# Patient Record
Sex: Male | Born: 1950 | Race: White | Hispanic: No | Marital: Married | Smoking: Former smoker
Health system: Southern US, Community
[De-identification: ages and names within clinical notes are randomized; demographics above are authoritative.]

## PROBLEM LIST (undated history)

## (undated) DIAGNOSIS — I10 Essential (primary) hypertension: Secondary | ICD-10-CM

## (undated) DIAGNOSIS — J449 Chronic obstructive pulmonary disease, unspecified: Secondary | ICD-10-CM

## (undated) DIAGNOSIS — I2510 Atherosclerotic heart disease of native coronary artery without angina pectoris: Secondary | ICD-10-CM

## (undated) DIAGNOSIS — K529 Noninfective gastroenteritis and colitis, unspecified: Secondary | ICD-10-CM

## (undated) DIAGNOSIS — G4700 Insomnia, unspecified: Secondary | ICD-10-CM

## (undated) DIAGNOSIS — I4892 Unspecified atrial flutter: Secondary | ICD-10-CM

## (undated) DIAGNOSIS — N189 Chronic kidney disease, unspecified: Secondary | ICD-10-CM

## (undated) DIAGNOSIS — E785 Hyperlipidemia, unspecified: Secondary | ICD-10-CM

## (undated) DIAGNOSIS — J45909 Unspecified asthma, uncomplicated: Secondary | ICD-10-CM

## (undated) DIAGNOSIS — F4310 Post-traumatic stress disorder, unspecified: Secondary | ICD-10-CM

## (undated) DIAGNOSIS — I4891 Unspecified atrial fibrillation: Secondary | ICD-10-CM

## (undated) HISTORY — DX: Atherosclerotic heart disease of native coronary artery without angina pectoris: I25.10

## (undated) HISTORY — DX: Unspecified atrial flutter: I48.92

## (undated) HISTORY — DX: Unspecified asthma, uncomplicated: J45.909

## (undated) HISTORY — DX: Insomnia, unspecified: G47.00

## (undated) HISTORY — DX: Chronic obstructive pulmonary disease, unspecified: J44.9

## (undated) HISTORY — DX: Essential (primary) hypertension: I10

## (undated) HISTORY — DX: Hyperlipidemia, unspecified: E78.5

---

## 2003-05-19 ENCOUNTER — Encounter: Payer: Self-pay | Admitting: Emergency Medicine

## 2003-05-19 ENCOUNTER — Emergency Department (HOSPITAL_COMMUNITY): Admission: EM | Admit: 2003-05-19 | Discharge: 2003-05-19 | Payer: Self-pay | Admitting: Emergency Medicine

## 2003-11-25 HISTORY — PX: CORONARY ARTERY BYPASS GRAFT: SHX141

## 2004-10-14 ENCOUNTER — Ambulatory Visit (HOSPITAL_COMMUNITY): Admission: RE | Admit: 2004-10-14 | Discharge: 2004-10-14 | Payer: Self-pay | Admitting: Family Medicine

## 2004-10-14 ENCOUNTER — Ambulatory Visit: Payer: Self-pay | Admitting: *Deleted

## 2004-10-14 ENCOUNTER — Ambulatory Visit (HOSPITAL_COMMUNITY): Admission: RE | Admit: 2004-10-14 | Discharge: 2004-10-14 | Payer: Self-pay | Admitting: *Deleted

## 2004-10-15 ENCOUNTER — Ambulatory Visit: Payer: Self-pay | Admitting: Cardiovascular Disease

## 2004-10-15 ENCOUNTER — Inpatient Hospital Stay (HOSPITAL_BASED_OUTPATIENT_CLINIC_OR_DEPARTMENT_OTHER): Admission: RE | Admit: 2004-10-15 | Discharge: 2004-10-15 | Payer: Self-pay | Admitting: Cardiovascular Disease

## 2004-10-15 ENCOUNTER — Inpatient Hospital Stay (HOSPITAL_COMMUNITY): Admission: AD | Admit: 2004-10-15 | Discharge: 2004-10-15 | Payer: Self-pay | Admitting: Cardiovascular Disease

## 2004-10-16 ENCOUNTER — Inpatient Hospital Stay (HOSPITAL_COMMUNITY): Admission: AD | Admit: 2004-10-16 | Discharge: 2004-10-20 | Payer: Self-pay | Admitting: Cardiothoracic Surgery

## 2004-10-16 HISTORY — PX: CORONARY ARTERY BYPASS GRAFT: SHX141

## 2004-10-25 ENCOUNTER — Ambulatory Visit (HOSPITAL_COMMUNITY): Admission: RE | Admit: 2004-10-25 | Discharge: 2004-10-25 | Payer: Self-pay | Admitting: Cardiothoracic Surgery

## 2004-10-25 ENCOUNTER — Ambulatory Visit: Payer: Self-pay | Admitting: Cardiology

## 2004-10-25 ENCOUNTER — Ambulatory Visit (HOSPITAL_COMMUNITY): Admission: RE | Admit: 2004-10-25 | Discharge: 2004-10-25 | Payer: Self-pay | Admitting: Cardiology

## 2004-10-30 ENCOUNTER — Ambulatory Visit: Payer: Self-pay | Admitting: Cardiovascular Disease

## 2004-11-21 ENCOUNTER — Ambulatory Visit: Payer: Self-pay | Admitting: Internal Medicine

## 2004-11-27 ENCOUNTER — Encounter (HOSPITAL_COMMUNITY): Admission: RE | Admit: 2004-11-27 | Discharge: 2004-12-27 | Payer: Self-pay | Admitting: *Deleted

## 2005-01-08 ENCOUNTER — Encounter (HOSPITAL_COMMUNITY): Admission: RE | Admit: 2005-01-08 | Discharge: 2005-02-07 | Payer: Self-pay | Admitting: *Deleted

## 2005-01-29 ENCOUNTER — Ambulatory Visit (HOSPITAL_COMMUNITY): Admission: RE | Admit: 2005-01-29 | Discharge: 2005-01-29 | Payer: Self-pay | Admitting: Internal Medicine

## 2005-01-29 ENCOUNTER — Ambulatory Visit: Payer: Self-pay | Admitting: Internal Medicine

## 2005-01-29 HISTORY — PX: COLONOSCOPY: SHX174

## 2006-08-04 ENCOUNTER — Ambulatory Visit: Payer: Self-pay | Admitting: Cardiology

## 2006-08-13 ENCOUNTER — Ambulatory Visit: Payer: Self-pay

## 2008-02-06 HISTORY — PX: COLONOSCOPY: SHX174

## 2008-02-07 ENCOUNTER — Ambulatory Visit (HOSPITAL_COMMUNITY): Admission: RE | Admit: 2008-02-07 | Discharge: 2008-02-07 | Payer: Self-pay | Admitting: Internal Medicine

## 2008-02-07 ENCOUNTER — Ambulatory Visit: Payer: Self-pay | Admitting: Internal Medicine

## 2008-02-07 ENCOUNTER — Encounter: Payer: Self-pay | Admitting: Internal Medicine

## 2008-07-14 ENCOUNTER — Ambulatory Visit (HOSPITAL_COMMUNITY): Admission: RE | Admit: 2008-07-14 | Discharge: 2008-07-14 | Payer: Self-pay | Admitting: Family Medicine

## 2008-07-25 ENCOUNTER — Ambulatory Visit (HOSPITAL_COMMUNITY): Admission: RE | Admit: 2008-07-25 | Discharge: 2008-07-25 | Payer: Self-pay | Admitting: Family Medicine

## 2008-08-10 ENCOUNTER — Ambulatory Visit: Payer: Self-pay | Admitting: Cardiology

## 2008-08-16 ENCOUNTER — Ambulatory Visit: Payer: Self-pay

## 2010-04-05 ENCOUNTER — Encounter: Payer: Self-pay | Admitting: Cardiology

## 2010-04-08 DIAGNOSIS — I2510 Atherosclerotic heart disease of native coronary artery without angina pectoris: Secondary | ICD-10-CM | POA: Insufficient documentation

## 2010-04-08 DIAGNOSIS — E785 Hyperlipidemia, unspecified: Secondary | ICD-10-CM | POA: Insufficient documentation

## 2010-04-08 DIAGNOSIS — J449 Chronic obstructive pulmonary disease, unspecified: Secondary | ICD-10-CM | POA: Insufficient documentation

## 2010-04-08 DIAGNOSIS — I10 Essential (primary) hypertension: Secondary | ICD-10-CM | POA: Insufficient documentation

## 2010-04-10 ENCOUNTER — Ambulatory Visit: Payer: Self-pay | Admitting: Cardiology

## 2010-04-10 ENCOUNTER — Encounter (INDEPENDENT_AMBULATORY_CARE_PROVIDER_SITE_OTHER): Payer: Self-pay | Admitting: *Deleted

## 2010-04-10 DIAGNOSIS — I951 Orthostatic hypotension: Secondary | ICD-10-CM | POA: Insufficient documentation

## 2010-12-15 ENCOUNTER — Encounter: Payer: Self-pay | Admitting: Otolaryngology

## 2010-12-24 NOTE — Letter (Signed)
Summary: Generic Letter  Architectural technologist, Main Office  [address]   [place], [place] [address]   Phone: [phone]  Fax: [phone]        Apr 10, 2010 MRN: [id number]    Dennis [name] [address] [place], [place]  [address]    TO WHOM IT MAY CONCERN, ABOVE NAMED PT IS UNDER MY CARE.IN MY OPINION IS NOT ABLE TO WORK  THE NIGHT SHIFT FROM A CARDIOVASCULAR STAND POINT. PLEASE CONSIDER ABOVE FROM A LONG TIME FAITHFUL EMPLOYEE.         Sincerely,  TOM WALL MD/Audry Kauzlarich, LPN   This letter has been electronically signed by your physician.

## 2010-12-24 NOTE — Assessment & Plan Note (Signed)
Summary: rov/ fainting spells, pt has bcbs. gd   Visit Type:  rov Primary Provider:  Simone Curia  CC:  pt states he has had some presyncope episodes....no other complaints today.  History of Present Illness: [name] comes in today for evaluation and management of his coronary artery disease, history of bypass surgery, hypertension, and mixed hyperlipidemia.  He describes symptomatic orthostatic hypotension even from childhood. 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  He's been particularly plagued by this lately. He's been a lot of stress having to go back to night shift. This caused him not to take his medications are regular intervals, not to eat appropriately. There is a fair amount of manual work including heat in his job.  He denies angina or ischemic symptoms.  His wife has been sick over this winter and his diet has suffered. He's been eating out a lot and eating whatever comes to the door as his wife puts it. He eats a lot of complex carbs and still drinks a fair amount of beer.  Current Medications (verified): 1)  Fish Oil 1200 Mg Caps (Omega-3 Fatty Acids) .Marland Kitchen.. 1 Cap Two Times A Day 2)  Vytorin 10-40 Mg Tabs (Ezetimibe-Simvastatin) .Marland Kitchen.. 1 Tab At Bedtime 3)  Aspirin 81 Mg Tbec (Aspirin) .... Take One Tablet By Mouth Daily 4)  Enalapril Maleate 10 Mg Tabs (Enalapril Maleate) .Marland Kitchen.. 1 Tab Once Daily 5)  Metoprolol Tartrate 50 Mg Tabs (Metoprolol Tartrate) .Marland Kitchen.. 1 Tab Two Times A Day 6)  Fenofibrate 160 Mg Tabs (Fenofibrate) .Marland Kitchen.. 1 Tab At Bedtime  Allergies (verified): No Known Drug Allergies  Past History:  Past Medical History: Last updated: 04/08/2010 CAD, ARTERY BYPASS GRAFT (ICD-414.04) HYPERTENSION (ICD-401.9) HYPERLIPIDEMIA (ICD-272.4) COPD (ICD-496)  Past Surgical History: Last updated: 04/08/2010 colonoscopy CABG x 3  Social History: Last updated: 04/08/2010 Tobacco Use - Yes. Chews  He is a smoker.  He has two half-sisters without  significant coronary disease.  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  He is married with three children.  He has a long-standing 60-pack-year history of smoking.  He quit six years  ago.  He does chew tobacco, however, so there is still nicotine in the  system.  He has a temperament that would suggest possible alcohol abuse.  He  drinks five to seven beers per day.  Risk Factors: Smoking Status: current (04/08/2010)  Review of Systems       negative other than history of present illness  Vital Signs:  Patient profile:   60 year old male Height:      73 inches Weight:      197 pounds BMI:     26.08 Pulse rate:   68 / minute Pulse (ortho):   74 / minute Pulse rhythm:   regular BP sitting:   131 / 80  (left arm) BP standing:   127 / 79 Cuff size:   large  Vitals Entered By: Danielle Rankin, CMA (Apr 10, 2010 12:18 PM)  Serial Vital Signs/Assessments:  Time      Position  BP       Pulse  Resp  Temp     By 12:23 PM  Lying [place]  131/80   [address] CMA 12:24 PM  Sitting   132/85   67  Danielle Rankin, CMA 12:24 PM  Standing  127/79   [address], [place] 12:26 PM  Standing  130/87   75                    Danielle Rankin, [place] 12:28 PM  Standing  126/84   72                    Danielle Rankin, [place]  Comments: 12:23 PM no sxms By: Danielle Rankin, CMA  12:24 PM no sxsm By: Danielle Rankin, CMA  12:24 PM no sxms By: Danielle Rankin, CMA  12:26 PM no sxms By: Danielle Rankin, CMA  12:28 PM no sxms By: Danielle Rankin, CMA    Physical Exam  General:  Well developed, well nourished, in no acute distress. Head:  normocephalic and atraumatic Eyes:  PERRLA/EOM intact; conjunctiva and lids normal. Neck:  Neck supple, no JVD. No masses, thyromegaly or abnormal cervical nodes. Chest Farah Lepak:  no deformities or breast masses noted Lungs:  decreased breath sounds throughout Heart:  PMI nondisplaced,  regular rate and rhythm, normal S1-S2 Abdomen:  Bowel sounds positive; abdomen soft and non-tender without masses, organomegaly, or hernias noted. No hepatosplenomegaly. Msk:  Back normal, normal gait. Muscle strength and tone normal. Pulses:  pulses normal in all 4 extremities Extremities:  No clubbing or cyanosis. Neurologic:  Alert and oriented x 3. Skin:  Intact without lesions or rashes. Psych:  Normal affect.   Impression & Recommendations:  Problem # 1:  CAD, ARTERY BYPASS GRAFT (ICD-414.04) Assessment Unchanged  His updated medication list for this problem includes:    Aspirin 81 Mg Tbec (Aspirin) .Marland Kitchen... Take one tablet by mouth daily    Enalapril Maleate 10 Mg Tabs (Enalapril maleate) .Marland Kitchen... 1 tab once daily    Metoprolol Tartrate 50 Mg Tabs (Metoprolol tartrate) .Marland Kitchen... 1 tab two times a day  Orders: EKG w/ Interpretation (93000) Nuclear Stress Test (Nuc Stress Test)  His updated medication list for this problem includes:    Aspirin 81 Mg Tbec (Aspirin) .Marland Kitchen... Take one tablet by mouth daily    Enalapril Maleate 10 Mg Tabs (Enalapril maleate) .Marland Kitchen... 1 tab once daily    Metoprolol Tartrate 50 Mg Tabs (Metoprolol tartrate) .Marland Kitchen... 1 tab two times a day  Problem # 2:  HYPERTENSION (ICD-401.9)  His updated medication list for this problem includes:    Aspirin 81 Mg Tbec (Aspirin) .Marland Kitchen... Take one tablet by mouth daily    Enalapril Maleate 10 Mg Tabs (Enalapril maleate) .Marland Kitchen... 1 tab once daily    Metoprolol Tartrate 50 Mg Tabs (Metoprolol tartrate) .Marland Kitchen... 1 tab two times a day  His updated medication list for this problem includes:    Aspirin 81 Mg Tbec (Aspirin) .Marland Kitchen... Take one tablet by mouth daily    Enalapril Maleate 10 Mg Tabs (Enalapril maleate) .Marland Kitchen... 1 tab once daily    Metoprolol Tartrate 50 Mg Tabs (Metoprolol tartrate) .Marland Kitchen... 1 tab two times a day  Problem # 3:  ORTHOSTATIC HYPOTENSION (ICD-458.0) Assessment: Deteriorated Even though he is not hypotensive today, his  symptoms are classic for either neurocardiogenic syncope as a child. Good triggering factors include not staying well hydrated, excess alcohol, night shift which precludes taking his medicines on a regular schedule, not to mention the strain on his system working all night down his mid to late 29s. I've encouraged him  to stay well-hydrated, decrease alcohol, eat regular meals, take his medicines at proper intervals, and to not work night shift. I have written him a note to his employer to change him back to day shift.  Problem # 4:  CAD, ARTERY BYPASS GRAFT (ICD-414.04) Assessment: Unchanged I filled this problem is stable. All range for a 2 year objective assessment of his coronaries with an exercise Myoview this fall. No change in meds today. His updated medication list for this problem includes:i    Aspirin 81 Mg Tbec (Aspirin) .Marland Kitchen... Take one tablet by mouth daily    Enalapril Maleate 10 Mg Tabs (Enalapril maleate) .Marland Kitchen... 1 tab once daily    Metoprolol Tartrate 50 Mg Tabs (Metoprolol tartrate) .Marland Kitchen... 1 tab two times a day  Orders:  EKG w/ Interpretation (93000) Nuclear Stress Test (Nuc Stress Test)  Problem # 5:  HYPERLIPIDEMIA (ICD-272.4) Assessment: Deteriorated I have reviewed his numbers from May of this year. His total cholesterol 260, triglycerides 46, HDL 43. His diet is suffered from his wife being ill this one or, night shift where he does not eat healthy, but he seems to be compliant with his meds. He also still drinks a fair amount of alcohol.  I agree with the addition of fenofibrate to his Vytorin. I strongly encouraged him to decrease alcohol and carbohydrate consumption.  Patient Instructions: 1)  Your physician recommends that you schedule a follow-up appointment in: SEPT 2011 SAME DAY AS STRESS MYOVIEW 2)  Your physician recommends that you continue on your current medications as directed. Please refer to the Current Medication list given to you today. 3)  Your physician has  requested that you have an exercise stress myoview.  For further information please visit [email or web address]  Please follow instruction sheet, as given.SEE DR Tyniesha Howald SAME DAY

## 2011-01-03 ENCOUNTER — Telehealth (INDEPENDENT_AMBULATORY_CARE_PROVIDER_SITE_OTHER): Payer: Self-pay | Admitting: *Deleted

## 2011-01-07 ENCOUNTER — Encounter: Payer: Self-pay | Admitting: Cardiology

## 2011-01-07 ENCOUNTER — Ambulatory Visit (HOSPITAL_COMMUNITY): Payer: BC Managed Care – PPO | Attending: Cardiology

## 2011-01-07 DIAGNOSIS — I25810 Atherosclerosis of coronary artery bypass graft(s) without angina pectoris: Secondary | ICD-10-CM | POA: Insufficient documentation

## 2011-01-07 DIAGNOSIS — R0989 Other specified symptoms and signs involving the circulatory and respiratory systems: Secondary | ICD-10-CM

## 2011-01-07 DIAGNOSIS — R0609 Other forms of dyspnea: Secondary | ICD-10-CM

## 2011-01-07 DIAGNOSIS — R0789 Other chest pain: Secondary | ICD-10-CM

## 2011-01-07 DIAGNOSIS — I2510 Atherosclerotic heart disease of native coronary artery without angina pectoris: Secondary | ICD-10-CM

## 2011-01-09 NOTE — Progress Notes (Signed)
Summary: Nuclear Pre-Procedure  Phone Note Outgoing Call Call back at [facility] Phone [phone]   Call placed by: Stanton Kidney, EMT-P,  January 03, 2011 11:32 AM Action Taken: Phone Call Completed Summary of Call: Reviewed information on Myoview Information Sheet (see scanned document for further details).  Spoke with the patient's wife. Stanton Kidney, EMT-P  January 03, 2011 11:33 AM     Nuclear Med Background Indications for Stress Test: Evaluation for Ischemia, Graft Patency   History: CABG, COPD, Heart Catheterization, Myocardial Perfusion Study  History Comments: '05 Heart Cath > CABG x5 > post op. AFIB '09 MPS: Inf. thinning, EF=64%     Nuclear Pre-Procedure Cardiac Risk Factors: History of Smoking, Hypertension, Lipids Height (in): 73

## 2011-01-15 NOTE — Assessment & Plan Note (Signed)
Summary: Cardiology Nuclear Testing  Nuclear Med Background Indications for Stress Test: Evaluation for Ischemia, Graft Patency   History: CABG, COPD, Heart Catheterization, Myocardial Perfusion Study  History Comments: '05 Heart Cath > CABG x5 > post op. AFIB '09 MPS: Inf. thinning, EF=64%  Symptoms: Chest Pain, SOB    Nuclear Pre-Procedure Cardiac Risk Factors: History of Smoking, Hypertension, Lipids Caffeine/Decaff Intake: none NPO After: 8:30 PM Lungs: clear IV 0.9% NS with Angio Cath: 20g     IV Site: R Forearm IV Started by: Cathlyn Parsons, RN Chest Size (in) 44     Height (in): 73 Weight (lb): 206 BMI: 27.28 Tech Comments: Metoprolol held x 24hrs.  Nuclear Med Study 1 or 2 day study:  1 day     Stress Test Type:  Stress Reading MD:  Olga Millers, MD     Referring MD:  T.Wall Resting Radionuclide:  Technetium 15m Tetrofosmin     Resting Radionuclide Dose:  11 mCi  Stress Radionuclide:  Technetium 39m Tetrofosmin     Stress Radionuclide Dose:  33 mCi   Stress Protocol Exercise Time (min):  8:46 min     Max HR:  146 bpm     Predicted Max HR:  162 bpm  Max Systolic BP: 189 mm Hg     Percent Max HR:  90.12 %     METS: 10.4 Rate Pressure Product:  [address]    Stress Test Technologist:  Milana Na, EMT-P     Nuclear Technologist:  Doyne Keel, CNMT  Rest Procedure  Myocardial perfusion imaging was performed at rest 45 minutes following the intravenous administration of Technetium 27m Tetrofosmin.  Stress Procedure  The patient exercised for 8:46. The patient stopped due to fatigue and denied any chest pain.  There were no significant ST-T wave changes and a rare pvc.  Technetium 66m Tetrofosmin was injected at peak exercise and myocardial perfusion imaging was performed after a brief delay.  QPS Raw Data Images:  Acquisition technically good; normal left ventricular size. Stress Images:  There is decreased uptake in the inferior wall Rest Images:  There is  decreased uptake in the inferior wall. Subtraction (SDS):  No evidence of ischemia. Transient Ischemic Dilatation:  .87  (Normal <1.22)  Lung/Heart Ratio:  .31  (Normal <0.45)  Quantitative Gated Spect Images QGS EDV:  124 ml QGS ESV:  46 ml QGS EF:  62 % QGS cine images:  Normal wall motion.   Overall Impression  Exercise Capacity: Good exercise capacity. BP Response: Normal blood pressure response. Clinical Symptoms: No chest pain ECG Impression: No significant ST segment change suggestive of ischemia. Overall Impression: Normal stress nuclear study with inferior thinning but no ischemia.  Appended Document: Cardiology Nuclear Testing reassurance, no further workup.  Reviewed Juanito Doom, MD  Appended Document: Cardiology Nuclear Testing [facility] Mylo Red RN  Appended Document: Cardiology Nuclear Testing Pt wife is aware of test results.  Pt & wife would also like for Dr. Daleen Squibb to know that his COPD is worsening.  Pt does note a regular cough as well as feeling unusually tired all of the time.  He is still working and is not on oxygen. They have read that these were also a side effect of Metoprolol.    Current BP:138/77, 124/80, 135/86 but these are less than 20 minutes after taking metoprolol. Discussed with wife about keeping bp log and when to check bp.They would like Dr. Daleen Squibb to review his medications and see if maybe the dose could be  decreased or another medication be substituted.  Mylo Red RN  Appended Document: Cardiology Nuclear Testing Pt wife is aware to slowly decrease and wean metoprolol over a couple of weeks.  They will keep a bp log two times a day after 15 min of rest and record.  If cough dissipates as medication is weaned pt would like to stay at lower dose of metoprolol providing ideal bp of less than 130/80 is obtained. Wife will call back to let us know how pt is doing. Mylo Red RN

## 2011-04-08 NOTE — Procedures (Signed)
NAME:  Dennis [name], Dennis [name]               ACCOUNT NO.:  [id number]   MEDICAL RECORD NO.:  [id number]         PATIENT TYPE:  POUT   LOCATION:  RESP                          FACILITY:  [facility]   PHYSICIAN:  Edward L. Juanetta Gosling, M.D.DATE OF BIRTH:  1951/03/03   DATE OF PROCEDURE:  07/25/2008  DATE OF DISCHARGE:                            PULMONARY FUNCTION TEST   1. Spirometry shows a moderate ventilatory defect with evidence of      airflow obstruction.  2. Lung volumes are normal.  3. DLCO is normal.  4. There is significant bronchodilator improvement.  This study is      consistent with the clinical diagnosis of COPD.      Edward L. Juanetta Gosling, M.D.  Electronically Signed     ELH/MEDQ  D:  07/26/2008  T:  07/27/2008  Job:  [id number]   cc:   Dr. Lubertha South

## 2011-04-08 NOTE — Op Note (Signed)
NAME:  Dennis [name], Dennis [name]               ACCOUNT NO.:  [id number]   MEDICAL RECORD NO.:  [id number]          PATIENT TYPE:  AMB   LOCATION:  DAY                           FACILITY:  [facility]   PHYSICIAN:  R. Roetta Sessions, M.D. DATE OF BIRTH:  08-29-1951   DATE OF PROCEDURE:  02/07/2008  DATE OF DISCHARGE:                               OPERATIVE REPORT      R. Roetta Sessions, M.D.  Electronically Signed     RMR/MEDQ  D:  02/07/2008  T:  02/07/2008  Job:  [id number]

## 2011-04-08 NOTE — Op Note (Signed)
NAME:  Dennis [name], Dennis [name]               ACCOUNT NO.:  [id number]   MEDICAL RECORD NO.:  [id number]          PATIENT TYPE:  AMB   LOCATION:  DAY                           FACILITY:  [facility]   PHYSICIAN:  R. Roetta Sessions, M.D. DATE OF BIRTH:  20-Apr-1951   DATE OF PROCEDURE:  02/07/2008  DATE OF DISCHARGE:                               OPERATIVE REPORT   INDICATIONS FOR PROCEDURE:  A 60 year old gentleman who essentially has  no lower GI tract symptoms.  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  There was no family history colon cancer.  Colonoscopy is now  being done as surveillance maneuver.  This approach has been discussed  with the patient at length.  Potential risks, benefits, alternatives,  and limitations have been reviewed, questions answered.  He is  agreeable.  Please see the documented history in the medical record.   PROCEDURE NOTE:  O2 saturation, blood pressure, pulse, and respirations  were monitored throughout the entire procedure.  Conscious sedation  Versed 3 mg IV, Demerol 75 mg IV in divided doses.   INSTRUMENT:  Pentax video chip system.   FINDINGS:  Digital rectal exam revealed no abnormalities.   ENDOSCOPIC FINDINGS:  The prep was good.   Colon:  Colonic mucosa was surveyed from the rectosigmoid junction  through the left, transverse, right colon to the appendiceal orifice,  ileocecal valve and cecum.  These structures were well seen and  photographed for the record.  Terminal ileum was intubated 10 cm from.  From this level, scope was slowly cautiously withdrawn.  All previous  mentioned mucosal surfaces were again seen.  The patient had scattered,  left-sided, shallow, narrow-mouth diverticula, and there was a single 3-  mm polyp at the splenic flexure which was cold biopsied/removed.  Remainder of the colonic mucosa and terminal mucosa appeared normal.  Scope was pulled down in the  rectum where thorough examination of the  rectal mucosa including retroflexed view of the anal verge demonstrated  a minimal anal papilla and internal hemorrhoids.  Otherwise, the rectal  mucosa appeared unremarkable.  The patient tolerated the procedure well  and was reactive to endoscopy.   IMPRESSION:  Minimal internal hemorrhoids of the anal papilla.  Otherwise normal rectum.  Shallow, narrow-mouthed, scattered, left-sided  diverticula.  Diminutive polyp splenic flexure, status post cold biopsy  removal.  The remainder of the colonic mucosa and terminal ileal mucosa  appeared normal.   RECOMMENDATIONS:  1. Diverticulosis literature provided to Mr. Ybarbo.  2. Follow-up on pathology.  3. Further recommendations to follow.      Jonathon Bellows, M.D.  Electronically Signed     RMR/MEDQ  D:  02/07/2008  T:  02/07/2008  Job:  [id number]   cc:   Donna Bernard, M.D.  Fax: [phone]

## 2011-04-08 NOTE — Assessment & Plan Note (Signed)
[facility]                            CARDIOLOGY OFFICE NOTE   NAME:Frentz, Dennis [name]                      MRN:          [id number]  DATE:08/10/2008                            DOB:          1951-04-16    Culp comes in today for followup.   PROBLEM LIST:  1. Coronary artery disease.  He has had a previous coronary artery      bypass surgery with 3 grafts in November 2005.  He has mild      decrease in left ventricular function with an ejection fraction      around 45-50%.   He is working out at [employer] on a regular basis and not having any chest  pain.  He says he does get very fatigued and sometimes has immediate  muscle aches right after the exercises.  He attributes this to his  statin.  He is still on Vytorin.  1. Hyperlipidemia.  2. Hypertension.  3. Chronic obstructive pulmonary disease with a history of heavy      tobacco use.  I think he still chews.   His wife is very supportive and is with him today.  She is in the B+.   CURRENT MEDICATIONS:  1. Omega-3 1200 mg p.o. b.i.d.  2. Vytorin 10/40 daily.  3. Aspirin 81 mg a day.  4. Enalapril 10 mg a day.  5. Metoprolol 50 mg p.o. b.i.d.   Dr. Gerda Diss has discovered that his triglycerides are elevated around  400.  Of note, he admits to drinking about 2 cases of beer a week.  He  also eats a lot complex carbs.  He has switched to wheat bread.   ALLERGIES:  He has no known drug allergies.   PHYSICAL EXAMINATION:  VITAL SIGNS:  His blood pressure is 110/80 and  his pulse was 71 and regular.  His weight was not done.  HEENT:  Normal.  NECK:  Carotid upstrokes were equal bilaterally without bruits.  No JVD.  Thyroid is not enlarged.  Trachea is midline.  LUNGS:  Clear to auscultation and percussion.  HEART:  Nondisplaced PMI.  Normal S1 and S2.  ABDOMEN:  Soft, good bowel sounds.  No midline bruit.  No hepatomegaly.  EXTREMITIES:  No cyanosis, clubbing, or edema.  Pulses are intact.  NEURO:  Intact.   EKG shows normal sinus rhythm with some ST-segment changes in V2 and V3,  which were little more pronounced than last year.   ASSESSMENT AND PLAN:  [name] seems to be doing well from a  functional standpoint.  We clearly need to address his  hypertriglyceridemia, and I have suggested that he cut his alcohol  intake at least by half and also cut off complex carbohydrates as much  as possible.  We reviewed this at length, answering about 20 minutes  worth of questions.   I do not think his muscle fatigue right after the exercise is statin-  related.  He does not ache all the time.  Not being able to take his  statin as a real liability to his vascular future.  I have emphasized  this with he and his wife answering again numerous questions.   He needs an exercise stress Myoview to rule out obstructive coronary  artery disease and test his graft patency.  I have advised him to have  fasting lipids and LFTs with Dr. Gerda Diss in about 3 months.   Assuming this Myoview is negative, we will see him back again in 2  years.     Thomas C. Daleen Squibb, MD, [facility]  Electronically Signed    TCW/MedQ  DD: 08/10/2008  DT: 08/10/2008  Job #: [id number]   cc:   Donna Bernard, M.D.

## 2011-04-11 NOTE — Discharge Summary (Signed)
NAME:  Dennis [name], Dennis [name] NO.:  [id number]   MEDICAL RECORD NO.:  [id number]          PATIENT TYPE:  INP   LOCATION:  3715                         FACILITY:  [facility]   PHYSICIAN:  Charlton Haws, M.D.     DATE OF BIRTH:  1950/11/27   DATE OF ADMISSION:  10/15/2004  DATE OF DISCHARGE:  10/15/2004                           DISCHARGE SUMMARY - REFERRING   HISTORY OF PRESENT ILLNESS:  Dennis [name] is a 60 year old white male who was  referred to our  office by Dr. Gerda Diss for abnormal Cardiolite.  The patient stated that over the preceding year, he has had substernal chest  pressure with exerting himself relieved with rest.  He denies any associated  nausea, vomiting, diaphoresis, or shortness of breath.  However, the  preceding 4-5 weeks he has noticed increased frequency of episodes.  The  stress Cardiolite performed revealed anterolateral ST segment depression.  No rest imaging was performed.  Due to the abnormality, he was recommended  cardiac catheterization.  He also has a history of dyslipidemia and unable  to tolerate statin therapy secondary to elevated LFTs, tobacco, and alcohol  use.   LABORATORY DATA:  Preadmission H&H was 14.9 and 41.1, normal indices,  platelets 178, WBC 8.2.  PT 12.6.  Sodium 134, potassium 4, BUN 11,  creatinine 1.1.  Chest x-ray done at [facility] showed COPD, questionable  right upper lobe nodular density, however, when compared to prior studies,  this was unchanged and probably related to the first rib.  In the hospital,  on November 22, repeat chemistry was performed and this showed sodium 135,  potassium 4, BUN 15, creatinine 1.1, total bilirubin 1.3, alkaline phos and  AST were within normal limits.  ALT was elevated at 45.  CBC had remained  unchanged.  CVTS also obtained a room air blood gas and this showed a pH  7.39, pCO2 43.7, pO2 76.6, with saturation of 95.6.   HOSPITAL COURSE:  Dennis [name] was brought into the [facility] lab for  outpatient  cardiac catheterization, however, Dr. Eden Emms, when he performed the  procedure, discovered a 60% left main, 80% proximal LAD, 80% proximal and  distal circumflex, 70% OM1, 100% RCA with good left to right collaterals.  LV function was low normal with an EF of approximately 55%.  Dr. Eden Emms felt  that he needed heparin, admission, and for CVTS to consult.  Dr. Eden Emms  spoke with Dr. Gerda Diss, Dr. Dorethea Clan, and CVTS.  CVTS saw the patient on  October 15, 2004, and arranged for bypass surgery to be performed on the  morning of November 23.  However, at approximately 6 o'clock, I was informed  by Clarisse Gouge that the patient insisted upon being discharged home.  I went to  the patient's floor/room.  The patient stated he was not staying here, the  surgeon stated he could go home.  On review of the surgeon's note, they did  not give him permission to be discharged and stated it was  up to Dr.  Eden Emms.  After discussing with Dr. Eden Emms, Dr. Eden Emms stated that the  patient could  not be discharged home.  If the patient insisted upon going  home, he needed  to sign out against medical advice.  The patient was agreeable to this.  I  did fill out the pink sheets in regards to his instructions and return to  the closest emergency room  if he had any further chest discomfort as well  as gave him prescription for sublingual nitroglycerin.  He will return per  CVTS instructions.       EW/MEDQ  D:  10/15/2004  T:  10/15/2004  Job:  [id number]   cc:   Donna Bernard, M.D.  [address]  [place]  [place] [address]  Fax: [phone]   Vida Roller, M.D.  Fax: [phone]

## 2011-04-11 NOTE — Procedures (Signed)
   NAME:  Dennis [name], Dennis [name]                         ACCOUNT NO.:  [id number]   MEDICAL RECORD NO.:  [id number]                   PATIENT TYPE:  EMS   LOCATION:  ED                                   FACILITY:  [facility]   PHYSICIAN:  Edward L. Juanetta Gosling, M.D.             DATE OF BIRTH:  1951/08/21   DATE OF PROCEDURE:  05/19/2003  DATE OF DISCHARGE:  05/19/2003                                EKG INTERPRETATION   DATE AND TIME OF TEST:  May 19, 2003 at 1715.   FINDINGS:  The rhythm is sinus rhythm with a rate of about 85.  There is  left axis deviation.  Somewhat slow R wave progression across the precordium  may indicate a previous anterior myocardial infarction and clinical  correlation is suggested.  Abnormal electrocardiogram.                                               Oneal Deputy. Juanetta Gosling, M.D.    ELH/MEDQ  D:  05/22/2003  T:  05/22/2003  Job:  [id number]

## 2011-04-11 NOTE — Assessment & Plan Note (Signed)
[facility]                              CARDIOLOGY OFFICE NOTE   NAME:Dennis [name], Dennis [name]                      MRN:          [id number]  DATE:08/04/2006                            DOB:          Jun 01, 1951    Dennis [name] is a 60 year old gentleman with coronary artery disease, status  post coronary artery bypass grafting x3 on October 17, 2004 by Dr. Kathlee Nations Trigt who comes today to establish with me as his cardiologist.  He has  been followed by Dr. Dorethea Clan who has now left.  He is having no symptoms of  angina which he had several days prior to his bypass surgery.   His EF was 45%.  He had a left internal mammary graft placed in the LAD,  right coronary artery graft to his circumflex, marginal vein graft to the  right coronary artery.   His risk factors were heavy tobacco use which he quit.  He also had a  history of hypertension and severe hyperlipidemia with a cholesterol in the  430 range.   He seems to be very health conscious.  He exercises on a regular basis.   CURRENT MEDICATIONS:  1. Omega-3 1200 mg b.i.d.  2. Vytorin 10/40 daily.  3. Aspirin 81 mg daily.  4. Enalapril 10 mg a day.  5. Metoprolol 50 mg b.i.d.   Other than some muscle aches in his calves, which he attributes to Vytorin,  he has no complaints.  The muscle cramps and aches have gotten worse than  they used to be over the last several weeks.  I have asked him to discuss  this with Dr. Gerda Diss and consider Crestor and Zetia.   PHYSICAL EXAMINATION:  GENERAL:  His exam today is very pleasant.  His wife  is present.  VITAL SIGNS: His blood pressure is 124/90, pulse 77 and regular.  His weight  is 209.  NECK:  His carotids are full without bruits.  There is no JVD.  Thyroid is  not enlarged.  Trachea is midline.  LUNGS:  Clear.  HEART:  Regular rate and rhythm.  ABDOMEN:  Soft with good bowel sounds.  No midline bruit.  There is no  hepatomegaly.  EXTREMITIES:  No  cyanosis, clubbing or edema.  Pulses are intact.   EKG shows normal sinus rhythm; normal EKG.   I have had a long talk with Dennis [name].  I have recommended an exercise rest  stress Myoview off his beta blocker.  If this is negative for ischemia with  good left ventricular function, we will plan on seeing him back again in two  years.  He has an excellent primary care physician who is quite capable of  providing his secondary prevention medicines.  I am happy with his medical  program.  If he continues to have muscle aches, I would consider changing to Crestor  40 and Zetia in combination.  This may have less side effects.  Thomas C. Daleen Squibb, MD, [facility]    TCW/MedQ  DD:  08/04/2006  DT:  08/05/2006  Job #:  [id number]   cc:   Donna Bernard, M.D.

## 2011-04-11 NOTE — Op Note (Signed)
NAME:  Dennis [name], Dennis [name] NO.:  [id number]   MEDICAL RECORD NO.:  [id number]          PATIENT TYPE:  INP   LOCATION:  2310                         FACILITY:  [facility]   PHYSICIAN:  Kathlee Nations Trigt III, M.D.DATE OF BIRTH:  [birth year]   DATE OF PROCEDURE:  10/17/2004  DATE OF DISCHARGE:                                 OPERATIVE REPORT   PREOPERATIVE DIAGNOSIS:  Class IV unstable angina with severe three-vessel  coronary artery disease.   POSTOPERATIVE DIAGNOSIS:  Class IV unstable angina with severe three-vessel  coronary artery disease.   OPERATION:  Coronary artery bypass grafting x3 (left internal mammary artery  to left anterior descending coronary artery, right radial artery graft to  circumflex marginal, saphenous vein graft to right coronary artery).   SURGEON:  Kerin Perna, M.D.   ASSISTANT:  Jerold Coombe, P.A.   ANESTHESIA:  General by Maren Beach, M.D.   INDICATIONS:  The patient is a 60 year old white male with a lipid disorder.  He has had postprandial and exertional chest pain for the past few weeks.  A  cardiac catheterization was performed after his stress test was abnormal.  This demonstrated chronic occlusion of the right coronary, proximal 90%  stenosis of the circumflex with diffuse disease, and proximal 90% stenosis  of the LAD with diffuse disease.  His ejection fraction was 45%, and left  ventricular end-diastolic pressure was approximately 20-22 mmHg.  Based on  his coronary anatomy, which was not felt to be amenable to percutaneous  intervention, a surgical revascularization procedure was recommended.   Prior to surgery I examined the patient in his hospital room and reviewed  the results of cardiac catheterization with the patient and his wife.  I  discussed the indications and expected benefits of coronary artery bypass  surgery for treatment of his coronary artery disease.  I reviewed the  alternatives to surgical therapy  as well.  I discussed with the patient and  his wife the risks to him of coronary artery bypass surgery, including the  risks of MI, CVA, bleeding, blood transfusion requirement, infection, and  death.  He understood that the conduits to be used would include the radial  artery from the right hand, which showed a patent palmar arch by  preoperative ultrasound studies, the left internal mammary artery, and  saphenous vein from the right leg harvested endoscopically.  He understood  that the harvest of the right radial artery could be associated with some  numbness or other neurological deficit of the right hand.  After discussing  all these aspects of the planned procedure and addressing all the patient's  and family's questions, the patient agreed to proceed with the operation as  planned under what I felt was an informed consent.   OPERATIVE FINDINGS:  The coronaries were diffusely diseased consistent with  a hyperlipidemia disorder.  There was diffuse cholesterol plaquing of the  LAD and circumflex vessels, which made them suboptimal targets.  The conduit  used was good.  The patient had a nonspecific coagulopathy, which required  treatment with platelets and FFP after  reversal of the heparin with  protamine.   PROCEDURE:  The patient was brought to the operating room and placed supine  on the operating room table.  General anesthesia was induced under invasive  hemodynamic monitoring.  The chest, abdomen, and legs were prepped with  Betadine and draped as a sterile field.  A right forearm incision was made,  as the arm had also been separately prepped and draped.  The right radial  artery was then harvested as a free graft using the Harmonic scalpel, with  care being taken to avoid any neurovascular deficit.  Prior to dividing the  radial artery, a patent palmar arch signal with the Doppler sterile  transducer was documented.  The artery was removed and flushed with a   papaverine-heparin solution and stored on the sterile back table.  The  forearm incision was then closed in layers using Vicryl and skin staples on  the skin.  The arm was then wrapped and tucked to the patient's side.   A sternal incision was then made as the saphenous vein was harvested  endoscopically from the right thigh.  The left internal mammary artery was  harvested as a pedicle graft from its origin at the subclavian vessels.  It  had good flow.  Heparin was administered, and the ACT was documented as  being therapeutic.  The sternal retractor was placed.  The pericardium was  opened and suspended as a cradle.  Pursestrings were placed in the ascending  aorta and right atrium, and the patient was cannulated and placed on bypass.  The coronaries were identified for grafting.  The mammary artery, radial  artery, and saphenous vein were prepared for the distal anastomoses.  Cardioplegia catheters were placed for both antegrade aortic and retrograde  coronary sinus cardioplegia.  The patient was cooled to 32 degrees and the  aortic crossclamp was applied.  A total of 800 mL of cold blood cardioplegia  was delivered in split doses between the antegrade aortic and retrograde  coronary sinus catheters.  There was good cardioplegic arrest, and septal  temperature dropped to less than 12 degrees.  Topical iced saline was used  to augment myocardial preservation, and a pericardial insulator pad was used  to protect the left phrenic nerve.   The distal coronary anastomoses were then performed.  The first distal  anastomoses was to the nondominant right coronary, which was 1.5 mm in  diameter.  It was totally occluded proximally.  A reversed saphenous vein  was sewn end-to-side with running 7-0 Prolene with good flow through the  graft.  The second distal anastomosis was to the obtuse marginal branch of  the circumflex.  This was a 1.5 mm vessel with diffuse cholesterol and calcified disease  with a proximal 90% stenosis.  The right radial artery  free graft was sewn end-to-side using running 8-0 Prolene.  There was good  flow through the graft.  Cardioplegia was redosed.  The third distal  anastomosis was at the distal aspect of the LAD.  Again this was diffusely  diseased and had a proximal 90% stenosis.  The left IMA pedicle was brought  through an opening created in the left lateral pericardium and was brought  down onto the LAD and sewn end-to-side with a running 8-0 Prolene.  There  was good flow through the anastomosis after briefly opening the vascular  bulldog on the mammary pedicle.  This bulldog was then replaced and the  pedicle was secured to the epicardium  with interrupted Prolenes.   While the crossclamp was still in place, two proximal anastomoses were  performed, placing the radial artery free graft and vein graft on the  anterior aspect of the ascending aorta.  A 4.0 mm punch was used and running  6-0 Prolene.  The air was vented from the left side of the heart and the  coronaries with a dose of retrograde warm blood cardioplegia and the usual  de-airing maneuvers on bypass.  After the proximal anastomoses had been  tied, the crossclamp was removed.   The heart was cardioverted back to a regular rhythm.  The patient was  reperfused and rewarmed.  Temporary pacing wires were applied.  The grafts  were inspected and found to have good flow, and hemostasis was documented at  the proximal and distal sites.  The patient was then weaned from bypass  without difficulty.  Blood pressure and cardiac output were stable.  Protamine was administered without adverse reaction.  The cannulas were  removed.  The mediastinum was irrigated with warm saline and the leg  incision was irrigated and closed in a standard fashion.  The superior  pericardial fat was closed over the aorta and vein grafts.  Two mediastinal  and a left pleural chest tube were  placed and brought out  through separate incisions.  The sternum was closed  with interrupted steel wire.  The pectoralis fascia was closed with a  running #1 Vicryl.  Subcutaneous and skin were closed with a running Vicryl.  Total bypass time was 140 minutes with crossclamp time of 78 minutes.      Pete   PV/MEDQ  D:  10/16/2004  T:  10/17/2004  Job:  [id number]   cc:   [facility]

## 2011-04-11 NOTE — Op Note (Signed)
NAME:  Dennis [name], Dennis [name]               ACCOUNT NO.:  [id number]   MEDICAL RECORD NO.:  [id number]          PATIENT TYPE:  AMB   LOCATION:  DAY                           FACILITY:  [facility]   PHYSICIAN:  R. Roetta Sessions, M.D. DATE OF BIRTH:  21-Jul-1951   DATE OF PROCEDURE:  01/29/2005  DATE OF DISCHARGE:                                 OPERATIVE REPORT   PROCEDURE:  Colonoscopy with biopsy and snare polypectomy.   INDICATIONS FOR PROCEDURE:  The patient is a 60 year old gentleman with rare  intermittent episodes of hematochezia and reported history of distal colitis  seen on colonoscopy some 30 years ago for screening colonoscopy. There is no  family history of colorectal neoplasia or inflammatory bowel disease.  Colonoscopy is now being done. This approach has been discussed with the  patient at length. Potential risks, benefits, and alternatives have been  reviewed and questions answered.   PROCEDURE NOTE:  O2 saturation, blood pressure, pulse, and respirations  monitored throughout the entirety of the procedure. Conscious sedation with  Versed 3 mg IV and Demerol 75 mg IV in divided doses. SB prophylaxis  ampicillin 2 g IV, gentamicin 120 mg IV.   INSTRUMENT:  Olympus video chip system.   FINDINGS:  Digital rectal examination revealed no abnormalities.   ENDOSCOPIC FINDINGS:  Prep was good.   Rectum:  Examination of the rectal mucosa including retroflexed view of anal  verge revealed a 6-mm pedunculated polyp again at 3 cm from the anal verge.  The remainder of the rectal mucosa appeared normal.   Colon:  Colonic mucosa was surveyed from the rectosigmoid junction through  the left, transverse, and right colon to area of the appendiceal orifice,  ileocecal valve, and cecum. These structures were well seen and photographed  for the record. Olympus video scope was slowly withdrawn, and all previously  mentioned mucosal surfaces were again seen. The colonic mucosa appeared  normal. I  elected to go ahead and biopsy sigmoid mucosa and the rectal  mucosa separately just to make sure he did not have any underlying  microscopic colitis. The polyp in the rectum was engaged with the snare, and  removed via snare cautery, it was removed cleanly, and the polyp was  recovered through the scope. The patient tolerated the procedure well and  was reactive to endoscopy.   IMPRESSION:  Rectal polyp status post snare polypectomy as described above.  Otherwise normal rectum. Normal appearing colonic mucosa status post biopsy  of sigmoid rectal mucosa separately.   RECOMMENDATIONS:  1.  No aspirin or arthritis medications for 10 days.  2.  Followup on pathology.  3.  Further recommendations to follow.      RMR/MEDQ  D:  01/29/2005  T:  01/29/2005  Job:  [id number]   cc:   Donna Bernard, M.D.  [address]  [place]  [place] [address]  Fax: [phone]

## 2011-04-11 NOTE — H&P (Signed)
NAME:  JATHAN, BALLING NO.:  [id number]   MEDICAL RECORD NO.:  [id number]          PATIENT TYPE:  OIB   LOCATION:  6501                         FACILITY:  [facility]   PHYSICIAN:  Charlton Haws, M.D.     DATE OF BIRTH:  09-23-1951   DATE OF ADMISSION:  10/15/2004  DATE OF DISCHARGE:                                HISTORY & PHYSICAL   Mr. Sippel is a 60 year old patient of Dr. Lubertha South and Dr. Dionicio Stall.  Arrangements were made for an outpatient catheterization today.  The patient  had severe left main three vessel disease with a total right coronary artery  and left to right collaterals.  He is being admitted for a CVTS consultation  and hopefully surgery in the next day or two.  Patient has a year long  history of exertional chest pain.  Lately it has also been postprandial.  He  had a stress test yesterday.  The Cardiolite images are not available but he  had 4 mm of ST segment depression in the EKG which was high risk.  The  patient has significant denial.  He has been having these symptoms for over  a year and just decided to get them checked.   He has some hyperlipidemia.  There is some issue about inability to take  Statin drugs.  I talked to Dr. Lubertha South and he thinks a lot of this may  be psychological.   PAST MEDICAL HISTORY:  Otherwise fairly unremarkable.   SOCIAL HISTORY:  He is a smoker.  He has two half-sisters without  significant coronary disease.  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  He is married with three children.  He has a long-standing 60-pack-year history of smoking.  He quit six years  ago.  He does chew tobacco, however, so there is still nicotine in the  system.  He has a temperament that would suggest possible alcohol abuse.  He  drinks five to seven beers per day.   ALLERGIES:  He has no known drug allergies.   MEDICATIONS:  He was on an aspirin a day and Prilosec p.r.n.   PHYSICAL EXAMINATION:  VITAL SIGNS:  Blood pressure 160/80, pulse 70 and  regular.  LUNGS:  Clear.  CARDIAC:  Carotids are normal.  There is an S1, S2 without murmur, rub,  gallop, or click.  ABDOMEN:  Benign.  EXTREMITIES:  Intact pulses.  No edema.   LABORATORIES:  Baseline laboratory work is unremarkable.  There is a  question of a lung nodule on his chest x-ray.  He needs a good PA and  lateral.   IMPRESSION:  Left main three vessel disease with left to right collaterals  to a total right coronary artery.  Good left ventricular function.  Patient  will be admitted for a CVTS consultation.   It may be reasonable to get preoperative PFTs as well as surveillance  Dopplers of his carotids and a PA and lateral chest x-ray to further work up  this lung nodule.  PN/MEDQ  D:  10/15/2004  T:  10/15/2004  Job:  [id number]

## 2011-04-11 NOTE — Discharge Summary (Signed)
NAME:  Dennis [name], HYSLOP NO.:  [id number]   MEDICAL RECORD NO.:  [id number]          PATIENT TYPE:  INP   LOCATION:  2033                         FACILITY:  [facility]   PHYSICIAN:  Kerin Perna, M.D.  DATE OF BIRTH:  1951-02-01   DATE OF ADMISSION:  10/15/2004  DATE OF DISCHARGE:  10/20/2004                                 DISCHARGE SUMMARY   HISTORY OF PRESENT ILLNESS:  The patient is a 60 year old gentleman with a  past medical history that is significant for dyslipidemia, who was recently  referred to Dr. Marchelle Folks office for evaluation following an abnormal  exercise Cardiolite.  The patient stated that over the past year he has  noted an increase in substernal pressure sensation with exertion that is  relieved with rest.  He denied associated nausea, vomiting, diaphoresis or  shortness of breath.  Over the previous 4-5 weeks, these episodes began to  be more frequent.  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  He presented to Dr. Gerda Diss who recommended a  Cardiolite test which was performed and showed a dramatic drop in his ST  segments in the anterolateral leads.  He was scheduled for a two day study,  and his stress images were performed.  However, no rest images were done.  The patient was felt to require further evaluation, and upon referring to  Dr. Dorethea Clan, cardiac catheterization was recommended, and he was scheduled to  be admitted on October 15, 2004, for the procedure.   PAST MEDICAL HISTORY:  Dyslipidemia notable for intolerance to statins  secondary to elevation and liver function tests.  He also has a strong  history of dyslipidemia.   ALLERGIES:  No known drug allergies.   ADMISSION MEDICATIONS:  1.  Started on 81 mg aspirins per day by Dr. Gerda Diss last week.  2.  Prilosec.   FAMILY HISTORY/SOCIAL HISTORY/REVIEW OF  SYSTEMS/PHYSICAL EXAMINATION:  Please see the History and Physical done at the time of admission.   HOSPITAL COURSE:  The patient was admitted on October 15, 2004, for cardiac  catheterization.  This was performed by Dr. Eden Emms.  Significant coronary  artery disease including 60% left main, 80% proximal LAD, 80% proximal  circumflex, 100% right coronary stenoses were found.  There was evidence of  good left to right collateralization.  Left ventricular function was in the  low normal range at 55% ejection fraction.  Due to these findings, the  patient was felt to require heparinization and prompt CVTS consultation  which was obtained with Dr. Kathlee Nations Trigt who evaluated the patient and  studies and agreed with recommendations to proceed with surgical  revascularization.   PROCEDURES:  On October 16, 2004, the patient was taken to the operating  room where he underwent the following procedure:   Coronary artery bypass grafting x3.  The following grafts were placed:  1.  Left internal mammary artery to the LAD.  2.  Saphenous vein graft  to the right coronary artery.  3.  Right radial artery to the circumflex.   Findings in the operating room included severe diffuse disease.  He did have  a postoperative coagulopathy requiring both platelets and fresh frozen  plasma.  Also of note, the diagonals and distal circumflex arteries were too  small to graft.  The patient tolerated the procedure well and was taken to  the surgical intensive care unit in stable condition.   HOSPITAL COURSE:  The patient has done quite well.  He was weaned from the  ventilator without significant difficulties.  The coagulopathy resolved and  all routine lines, monitors and drainage devices were discontinued in a  standard fashion.  The patient has been neurologically intact.  His right  upper extremity is neurovascularly intact.  The patient's oxygen has been  weaned and he maintained good saturations on room  air.  He has had a  postoperative atrial fibrillation and has been placed on Amiodarone.  This  has been converted to an oral regimen.  The patient also has a postoperative  anemia, but appears to be tolerating this quite well clinically.  He is  tolerating all routine cardiac rehabilitation phase I modalities without  difficulty.  Most recent hemoglobin and hematocrit dated October 19, 2004,  is 8.0 and 22.4, respectively.  Overall, the patient was felt to be in  satisfactory condition for discharge on October 20, 2004.   DISCHARGE MEDICATIONS:  1.  Aspirin 325 mg daily.  2.  Beta blocker:  Lopressor 25 mg b.i.d.  3.  Imdur 15 mg daily for one month.  4.  Amiodarone regimen currently is 400 mg b.i.d. x7 days and then he will      be converted to once daily dosing.   DISCHARGE INSTRUCTIONS:  The patient received written instructions in regard  to medications, activity, diet, wound care and follow up.   FOLLOWUP:  Staple removal at CVTS office later this week.  Additionally, he  should arrange to see Dr. Dorethea Clan in 2 weeks, Dr. Zenaida Niece Trigt's office will  call with an appointment in 3 weeks.   FINAL DIAGNOSES:  1.  Severe three vessel coronary artery disease as described, not status      post surgical revascularization.  2.  Postoperative anemia.  3.  Postoperative atrial fibrillation with chemical cardioversion to normal      sinus rhythm.  4.  Other diagnoses as previously listed per the history.      Alinda Dooms  D:  10/20/2004  T:  10/20/2004  Job:  [id number]   cc:   Vida Roller, M.D.  Fax: [phone]

## 2011-04-11 NOTE — Cardiovascular Report (Signed)
NAME:  Dennis, [name] NO.:  [id number]   MEDICAL RECORD NO.:  [id number]          PATIENT TYPE:  OIB   LOCATION:  6501                         FACILITY:  [facility]   PHYSICIAN:  Charlton Haws, M.D.     DATE OF BIRTH:  1951/07/24   DATE OF PROCEDURE:  DATE OF DISCHARGE:  10/15/2004                              CARDIAC CATHETERIZATION   PROCEDURE:  Coronary arteriography.   INDICATIONS:  Abnormal stress test with classic angina.   PROCEDURE:  Standard catheterization was done with 5-French catheters from  the right femoral artery.  Left main coronary artery had a 60% distal  stenosis.   Left anterior descending artery had a tight 80% eccentric lesion proximally.  Mid and distal vessel had 30-40% multi discreet lesions.   First diagonal branch had 30-40% multi discreet lesions.   Circumflex coronary artery was codominant.  There was a very tight 80%  eccentric lesion proximally.  There was an 80% AV groove lesion.  First  obtuse marginal branch had a 70% proximal lesion.   There were excellent left to right collaterals to the distal right coronary  artery all the way up to the mid vessel.   Right coronary artery was 100% occluded.  There was good left to right  collaterals as indicated.   RAO VENTRICULOGRAPHY:  RAO ventriculography showed low normal ejection  fraction with maybe minimal apical hypokinesis.  EF was 55%.  Aortic  pressure 147/95.  LV pressure 155/16.   IMPRESSION:  Patient will be admitted for CVTS consult.  He will be started  on heparin.  Apparently, he has been intolerant to Statins in the past but  we will need to reassess this with his cholesterol profile.   He did not have any significant chest pain during the procedure and  tolerated it well.       PN/MEDQ  D:  10/15/2004  T:  10/15/2004  Job:  [id number]

## 2011-04-11 NOTE — Procedures (Signed)
NAME:  Dennis [name], Dennis [name] NO.:  [id number]   MEDICAL RECORD NO.:  [id number]          PATIENT TYPE:  OUT   LOCATION:  RAD                           FACILITY:  [facility]   PHYSICIAN:  W. Stephen Luking, M.D.DATE OF BIRTH:  Apr 07, 1951   DATE OF PROCEDURE:  DATE OF DISCHARGE:                                    STRESS TEST   INDICATIONS FOR TEST:  This patient is a 60 year old white male with a  history of known hyperlipidemia, hypertension, and prior smoking who is seen  in the office a week prior with chest pressure at times with exertion.  The  pressure is deep, substernal, and an ache that lasts for over a minute and  then settles when the exertion stops.   This stress test was performed at standard Bruce protocol Cardiolite  augmentation.   Resting EKG revealed a normal sinus rhythm.  No significant ST/T changes.   Pretty rapidly during the first stage, the patient's heart rate went up.  By  the end of the first stage, the patient's heart rate was in the 130s, and  his ST segments had dropped very considerably.  He had a full 3 mm drop with  a flat slope at 0.08 seconds past the J point.  Cardiolite was administered.  The patient exercised for another minute.  His II, III, aVF, and V4-6  experienced a 3-4 mm drop across the board with flat ST segments.  The  patient then experienced some mild chest pressure.  He was given  nitroglycerin x2.  For the next 10-12 minutes, his ST segments resolved.   IMPRESSION:  Positive adequate stress test.   PLAN:  Await Cardiolite imaging.  I have already spoken with the  cardiologist.  Dr. Dorethea Clan has graciously accepted to see the patient within  just a few hours in the office.  Nitroglycerin prescribed.  Warning signs  discussed.     Dennis [name]   WSL/MEDQ  D:  10/15/2004  T:  10/15/2004  Job:  [id number]

## 2013-02-09 ENCOUNTER — Encounter: Payer: Self-pay | Admitting: Internal Medicine

## 2013-03-23 ENCOUNTER — Telehealth: Payer: Self-pay | Admitting: Family Medicine

## 2013-03-23 DIAGNOSIS — E785 Hyperlipidemia, unspecified: Secondary | ICD-10-CM

## 2013-03-23 DIAGNOSIS — Z79899 Other long term (current) drug therapy: Secondary | ICD-10-CM

## 2013-03-23 NOTE — Telephone Encounter (Signed)
Pt needs BW papers

## 2013-03-23 NOTE — Telephone Encounter (Signed)
Lip and liv

## 2013-03-23 NOTE — Telephone Encounter (Signed)
Blood work papers printed and left up front for patient.

## 2013-03-25 ENCOUNTER — Encounter: Payer: Self-pay | Admitting: *Deleted

## 2013-04-19 ENCOUNTER — Encounter: Payer: Self-pay | Admitting: Family Medicine

## 2013-04-19 ENCOUNTER — Ambulatory Visit (INDEPENDENT_AMBULATORY_CARE_PROVIDER_SITE_OTHER): Payer: BC Managed Care – PPO | Admitting: Family Medicine

## 2013-04-19 VITALS — BP 122/88 | Wt 209.6 lb

## 2013-04-19 DIAGNOSIS — J449 Chronic obstructive pulmonary disease, unspecified: Secondary | ICD-10-CM

## 2013-04-19 DIAGNOSIS — I25810 Atherosclerosis of coronary artery bypass graft(s) without angina pectoris: Secondary | ICD-10-CM

## 2013-04-19 DIAGNOSIS — I10 Essential (primary) hypertension: Secondary | ICD-10-CM

## 2013-04-19 DIAGNOSIS — E785 Hyperlipidemia, unspecified: Secondary | ICD-10-CM

## 2013-04-19 NOTE — Progress Notes (Signed)
  Subjective:    Patient ID: Dennis [name], male    DOB: 08-24-1951, 62 y.o.   MRN: [id number]  HPI  Patient arrives office for followup of numerous concerns. Has history of COPD. Smoked very heavily for a long time. Now requires an inhaler once week. Notes his breathing is worsening. Difficulty particularly in hot environment at work and breathing oil and numbness.  Patient claims compliance with his blood pressure medicine. No obvious side effects. Blood pressures when checked elsewhere is in good control.  Patient trying to watch his diet, though admits to some noncompliance with her regards to lipids. Compliant with meds. Unfortunately side effects with higher doses.  No obvious chest pain with coronary artery disease. Not exercising.  Review of Systems ROS otherwise negative.    Objective:   Physical Exam  Alert no acute distress. HEENT normal. Vitals reviewed. Lungs clear. Heart regular rate and rhythm. Ankles without edema.      Assessment & Plan:  Impression 1 hypertension good control. #2 hyperlipidemia control poor by usual standards, however better than historicall for patient. Unable to tolerate stronger medicines. #3 coronary artery disease clinically silent. #4 COPD discussed at length. Patient wishes to hold off on stronger meds for now. Diet exercise discussed. WSL

## 2013-04-19 NOTE — Patient Instructions (Signed)
Take all the meds as directed.

## 2013-04-20 ENCOUNTER — Other Ambulatory Visit: Payer: Self-pay | Admitting: Family Medicine

## 2013-04-20 MED ORDER — ENALAPRIL MALEATE 10 MG PO TABS: 10.0000 mg | ORAL_TABLET | Freq: Every day | ORAL | Status: DC

## 2013-04-20 MED ORDER — METOPROLOL TARTRATE 50 MG PO TABS: 50.0000 mg | ORAL_TABLET | Freq: Two times a day (BID) | ORAL | Status: DC

## 2013-04-20 MED ORDER — EZETIMIBE-SIMVASTATIN 10-40 MG PO TABS: 1.0000 | ORAL_TABLET | Freq: Every day | ORAL | Status: DC

## 2013-04-20 NOTE — Telephone Encounter (Signed)
Needs the following medications refilled through mail order for 90 day supply:  Enalaprill 10mg  Metoprolol 50mg  Vytorin 10/40mg   Please call patient when ready.

## 2013-04-27 ENCOUNTER — Other Ambulatory Visit: Payer: Self-pay | Admitting: Family Medicine

## 2013-04-28 ENCOUNTER — Other Ambulatory Visit: Payer: Self-pay | Admitting: *Deleted

## 2013-04-28 MED ORDER — EZETIMIBE-SIMVASTATIN 10-40 MG PO TABS: 1.0000 | ORAL_TABLET | Freq: Every day | ORAL | Status: DC

## 2013-04-28 MED ORDER — METOPROLOL TARTRATE 50 MG PO TABS: 50.0000 mg | ORAL_TABLET | Freq: Two times a day (BID) | ORAL | Status: DC

## 2013-05-25 ENCOUNTER — Telehealth: Payer: Self-pay | Admitting: Family Medicine

## 2013-05-25 MED ORDER — PREDNISONE 20 MG PO TABS: ORAL_TABLET | ORAL | Status: DC

## 2013-05-25 NOTE — Telephone Encounter (Signed)
pred 20 3 qd for three d, 2 qd for thrree. 1 qd for 2

## 2013-05-25 NOTE — Telephone Encounter (Signed)
Patient has a bad case of poison oak and is calling to find out if we can call in prednisone for this.    [name]

## 2013-05-25 NOTE — Telephone Encounter (Signed)
Med sent electronically to [facility]. Patient notified. 

## 2013-06-13 ENCOUNTER — Other Ambulatory Visit: Payer: Self-pay | Admitting: Family Medicine

## 2013-06-14 NOTE — Telephone Encounter (Signed)
RX called in on voicemail 

## 2013-06-14 NOTE — Telephone Encounter (Signed)
wis plus one ref

## 2013-07-21 ENCOUNTER — Other Ambulatory Visit: Payer: Self-pay | Admitting: Family Medicine

## 2013-09-11 ENCOUNTER — Other Ambulatory Visit: Payer: Self-pay | Admitting: Family Medicine

## 2013-09-23 ENCOUNTER — Encounter: Payer: Self-pay | Admitting: Cardiology

## 2013-09-26 ENCOUNTER — Encounter: Payer: Self-pay | Admitting: Family Medicine

## 2013-09-26 ENCOUNTER — Ambulatory Visit (INDEPENDENT_AMBULATORY_CARE_PROVIDER_SITE_OTHER): Payer: BC Managed Care – PPO | Admitting: Family Medicine

## 2013-09-26 VITALS — BP 130/84 | Temp 97.5°F | Ht 73.0 in | Wt 212.0 lb

## 2013-09-26 DIAGNOSIS — J209 Acute bronchitis, unspecified: Secondary | ICD-10-CM

## 2013-09-26 MED ORDER — LEVOFLOXACIN 500 MG PO TABS: 500.0000 mg | ORAL_TABLET | Freq: Every day | ORAL | Status: AC

## 2013-09-26 MED ORDER — ALBUTEROL SULFATE HFA 108 (90 BASE) MCG/ACT IN AERS: 2.0000 | INHALATION_SPRAY | RESPIRATORY_TRACT | Status: DC | PRN

## 2013-09-26 NOTE — Progress Notes (Signed)
  Subjective:    Patient ID: Dennis [name], male    DOB: [birth year], 62 y.o.   MRN: [id number]  Cough This is a new problem. The current episode started in the past 7 days. The problem has been gradually worsening. The problem occurs hourly. The cough is productive of purulent sputum. Associated symptoms include nasal congestion, postnasal drip, rhinorrhea and a sore throat. He has tried OTC cough suppressant for the symptoms. The treatment provided mild relief. His past medical history is significant for COPD.   Sig yellow congestion and draingae. Cough worsening.  Improved somewhat by Friday, then worse after that   Moved into chest ,some wheeziness, started inhaler   Review of Systems  HENT: Positive for postnasal drip, rhinorrhea and sore throat.   Respiratory: Positive for cough.        Objective:   Physical Exam  Alert mild malaise. HEENT some nasal congestion pharynx erythematous lungs rare rhonchi heart regular rate and rhythm      Assessment & Plan:  Impression acute bronchitis plan Levaquin daily 10 days. Hycodan 1 teaspoon each bedtime when necessary. Use inhaler freely. Work excuse given.

## 2013-10-21 ENCOUNTER — Other Ambulatory Visit: Payer: Self-pay | Admitting: Family Medicine

## 2013-11-18 ENCOUNTER — Other Ambulatory Visit: Payer: Self-pay | Admitting: Family Medicine

## 2013-11-18 NOTE — Telephone Encounter (Signed)
Ok times 4 

## 2013-12-07 ENCOUNTER — Other Ambulatory Visit: Payer: Self-pay | Admitting: Family Medicine

## 2013-12-21 ENCOUNTER — Telehealth: Payer: Self-pay | Admitting: Family Medicine

## 2013-12-21 DIAGNOSIS — Z79899 Other long term (current) drug therapy: Secondary | ICD-10-CM

## 2013-12-21 DIAGNOSIS — Z125 Encounter for screening for malignant neoplasm of prostate: Secondary | ICD-10-CM

## 2013-12-21 DIAGNOSIS — E785 Hyperlipidemia, unspecified: Secondary | ICD-10-CM

## 2013-12-21 NOTE — Telephone Encounter (Signed)
Lip liv m7 psa 

## 2013-12-21 NOTE — Telephone Encounter (Signed)
Orders ready at lab. Pt's wife notified.

## 2013-12-21 NOTE — Telephone Encounter (Signed)
Patient needs blood work order °

## 2013-12-29 LAB — HEPATIC FUNCTION PANEL
ALBUMIN: 4.6 g/dL (ref 3.5–5.2)
ALK PHOS: 24 U/L — AB (ref 39–117)
ALT: 16 U/L (ref 0–53)
AST: 20 U/L (ref 0–37)
Bilirubin, Direct: 0.1 mg/dL (ref 0.0–0.3)
Indirect Bilirubin: 0.5 mg/dL (ref 0.2–1.2)
Total Bilirubin: 0.6 mg/dL (ref 0.2–1.2)
Total Protein: 6.9 g/dL (ref 6.0–8.3)

## 2013-12-29 LAB — PSA: PSA: 0.7 ng/mL (ref <=4.00)

## 2013-12-29 LAB — BASIC METABOLIC PANEL
BUN: 20 mg/dL (ref 6–23)
CO2: 29 mEq/L (ref 19–32)
CREATININE: 1.12 mg/dL (ref 0.50–1.35)
Calcium: 9.6 mg/dL (ref 8.4–10.5)
Chloride: 101 mEq/L (ref 96–112)
Glucose, Bld: 101 mg/dL — ABNORMAL HIGH (ref 70–99)
Potassium: 5.3 mEq/L (ref 3.5–5.3)
Sodium: 137 mEq/L (ref 135–145)

## 2013-12-29 LAB — LIPID PANEL
CHOLESTEROL: 258 mg/dL — AB (ref 0–200)
HDL: 45 mg/dL (ref >39)
LDL Cholesterol: 178 mg/dL — ABNORMAL HIGH (ref 0–99)
Total CHOL/HDL Ratio: 5.7 Ratio
Triglycerides: 176 mg/dL — ABNORMAL HIGH (ref <150)
VLDL: 35 mg/dL (ref 0–40)

## 2013-12-30 ENCOUNTER — Telehealth: Payer: Self-pay | Admitting: Family Medicine

## 2013-12-30 NOTE — Telephone Encounter (Signed)
At this point, no intervention to avoid since exposure has already taken place, generally causes colds only in adult patients, but if infxn hits and then progresses would rec ov for rx

## 2013-12-30 NOTE — Telephone Encounter (Signed)
Patient has been watching grandchild all week and just found out he has RSV virus.Wife is concerned because spouse has COPD she wants to know what precaution they should take.

## 2014-01-03 NOTE — Telephone Encounter (Signed)
Tried to call home # multiple times on multiple days and only would get a busy signal.

## 2014-01-09 ENCOUNTER — Ambulatory Visit: Payer: BC Managed Care – PPO | Admitting: Family Medicine

## 2014-01-12 ENCOUNTER — Other Ambulatory Visit: Payer: Self-pay | Admitting: Family Medicine

## 2014-01-13 ENCOUNTER — Ambulatory Visit (INDEPENDENT_AMBULATORY_CARE_PROVIDER_SITE_OTHER): Payer: BC Managed Care – PPO | Admitting: Family Medicine

## 2014-01-13 ENCOUNTER — Encounter: Payer: Self-pay | Admitting: Family Medicine

## 2014-01-13 VITALS — BP 130/94 | Ht 73.0 in | Wt 212.0 lb

## 2014-01-13 DIAGNOSIS — I10 Essential (primary) hypertension: Secondary | ICD-10-CM

## 2014-01-13 DIAGNOSIS — I951 Orthostatic hypotension: Secondary | ICD-10-CM

## 2014-01-13 DIAGNOSIS — M25569 Pain in unspecified knee: Secondary | ICD-10-CM

## 2014-01-13 DIAGNOSIS — I2510 Atherosclerotic heart disease of native coronary artery without angina pectoris: Secondary | ICD-10-CM

## 2014-01-13 DIAGNOSIS — J449 Chronic obstructive pulmonary disease, unspecified: Secondary | ICD-10-CM

## 2014-01-13 DIAGNOSIS — E785 Hyperlipidemia, unspecified: Secondary | ICD-10-CM

## 2014-01-13 MED ORDER — ZOLPIDEM TARTRATE 10 MG PO TABS: 10.0000 mg | ORAL_TABLET | Freq: Every evening | ORAL | Status: DC | PRN

## 2014-01-13 MED ORDER — EZETIMIBE-SIMVASTATIN 10-40 MG PO TABS: 1.0000 | ORAL_TABLET | Freq: Every day | ORAL | Status: DC

## 2014-01-13 MED ORDER — ENALAPRIL MALEATE 10 MG PO TABS: 10.0000 mg | ORAL_TABLET | Freq: Every day | ORAL | Status: DC

## 2014-01-13 MED ORDER — FENOFIBRATE 160 MG PO TABS: 160.0000 mg | ORAL_TABLET | Freq: Every day | ORAL | Status: DC

## 2014-01-13 MED ORDER — METOPROLOL TARTRATE 50 MG PO TABS: 50.0000 mg | ORAL_TABLET | Freq: Two times a day (BID) | ORAL | Status: DC

## 2014-01-13 MED ORDER — ALBUTEROL SULFATE HFA 108 (90 BASE) MCG/ACT IN AERS: 2.0000 | INHALATION_SPRAY | RESPIRATORY_TRACT | Status: DC | PRN

## 2014-01-13 NOTE — Progress Notes (Signed)
Subjective:    Patient ID: Dennis [name], male    DOB: [birth year], 63 y.o.   MRN: [id number]  HPIFollow up on bloodwork.    Results for orders placed in visit on 12/21/13  HEPATIC FUNCTION PANEL      Result Value Ref Range   Total Bilirubin 0.6  0.2 - 1.2 mg/dL   Bilirubin, Direct 0.1  0.0 - 0.3 mg/dL   Indirect Bilirubin 0.5  0.2 - 1.2 mg/dL   Alkaline Phosphatase 24 (*) 39 - 117 U/L   AST 20  0 - 37 U/L   ALT 16  0 - 53 U/L   Total Protein 6.9  6.0 - 8.3 g/dL   Albumin 4.6  3.5 - 5.2 g/dL  LIPID PANEL      Result Value Ref Range   Cholesterol 258 (*) 0 - 200 mg/dL   Triglycerides 176 (*) <150 mg/dL   HDL 45  >39 mg/dL   Total CHOL/HDL Ratio 5.7     VLDL 35  0 - 40 mg/dL   LDL Cholesterol 178 (*) 0 - 99 mg/dL  BASIC METABOLIC PANEL      Result Value Ref Range   Sodium 137  135 - 145 mEq/L   Potassium 5.3  3.5 - 5.3 mEq/L   Chloride 101  96 - 112 mEq/L   CO2 29  19 - 32 mEq/L   Glucose, Bld 101 (*) 70 - 99 mg/dL   BUN 20  6 - 23 mg/dL   Creat 1.12  0.50 - 1.35 mg/dL   Calcium 9.6  8.4 - 10.5 mg/dL  PSA      Result Value Ref Range   PSA 0.70  <=4.00 ng/mL    Discuss breathing. Breathing getting worse. Using inhalers prn. Trouble breathing at [place] a lot of steps. Short winded when getting to the top. Known history of  COPD during last workup 6 years ago. Now requiring inhaler nearly daily.  Feels funy feelings in thre chest. Sharp transient in nature. Often not associated with exertion. Comes and goes. Has not seen a cardiologist for several years. Positive history coronary artery disease  Requesting referral to cardiologist. Has not seen one in 3 years.   Discuss exposure to a chemical at work. Concerned about exposure to epoxy substance at work. Causes shortness of breath and wheezing.  Claims compliance with current medications.  Mostly watching diet.  Patient also reports leg pain. Achy in nature. Worse with motion. Is worried about blood flow to  the feet. Notes cold feet at times.  Notes fatigue at times with exertion.  Would like to get 90 day scripts printed to mail in to mail order.  Review of Systems No headache no abdominal pain positive dyspnea atypical chest discomfort no loss of consciousness no abdominal pain no change in bowel habits no blood in stool    Objective:   Physical Exam  Alert no apparent distress. Vitals reviewed. H&T normal. Blood pressure repeat 130/86. Lungs diminished breath sounds. No tachypnea no crackles no wheezes heart regular in rhythm. Ankles trace edema. Arterial pulses diminished. Sensation lateral right foot diminished sensation. Prominent veins.      Assessment & Plan:  Impression 1 COPD clinically worsening. Patient reports significant dyspnea discussed at great length. #2 coronary artery disease. Has not seen a heart doctor several years. Atypical symptomatology recent months. Discussed length. #3 hyperlipidemia control decent for this patient. Unable to tolerate higher doses of medicines. #4 hypertension stable. #5 leg pain.  Plan 40 minutes that with this patient's a most in discussion. Time for cardiology consultation. Time for pulmonary consultation rationale discussed. Diet exercise discussed. Nature of dyspnea discussed. Epoxy exposure is likely irritant not the source for patient's dyspnea. We will do arterial Doppler studies. Return in a couple weeks for further discussion. WSL

## 2014-01-16 ENCOUNTER — Encounter: Payer: Self-pay | Admitting: Family Medicine

## 2014-01-20 ENCOUNTER — Ambulatory Visit (HOSPITAL_COMMUNITY)
Admission: RE | Admit: 2014-01-20 | Discharge: 2014-01-20 | Disposition: A | Discharge status: Home / Self Care | Payer: BC Managed Care – PPO | Source: Ambulatory Visit | Attending: Family Medicine | Admitting: Family Medicine

## 2014-01-20 DIAGNOSIS — R209 Unspecified disturbances of skin sensation: Secondary | ICD-10-CM | POA: Insufficient documentation

## 2014-01-30 ENCOUNTER — Ambulatory Visit (INDEPENDENT_AMBULATORY_CARE_PROVIDER_SITE_OTHER): Payer: BC Managed Care – PPO | Admitting: Family Medicine

## 2014-01-30 ENCOUNTER — Encounter: Payer: Self-pay | Admitting: Family Medicine

## 2014-01-30 VITALS — BP 132/80 | Ht 73.0 in | Wt 213.8 lb

## 2014-01-30 DIAGNOSIS — M25569 Pain in unspecified knee: Secondary | ICD-10-CM

## 2014-01-30 DIAGNOSIS — R21 Rash and other nonspecific skin eruption: Secondary | ICD-10-CM

## 2014-01-30 NOTE — Progress Notes (Signed)
   Subjective:    Patient ID: Dennis [name], male    DOB: November 24, 1951, 63 y.o.   MRN: [id number]  HPI Patient arrives to follow up on leg pain and discuss recent ultrasound results. Leg pain lateral foot and ankle pain  Burning pain and aching  staryed four or five months ago worse in thre morn  Aching and botom of the bfoot is uncomfortable  chnages shoes regualrly  Knees seem to be okay, fatigue with exertion no back of the legs get weak Worse with exrtion  jointys are staring to ache in the morning  Rash developed legs and torso Does not itch abut a burning sens at times No rash tendency, ques if epoxy irrit is causing rash,    Also notes the veins in his legs and feet aren't considerably dilated at times. At times some swelling in the evening    The patient also has a rash on legs, torso and bottom he would like checked.   Review of Systems No back pain no new chest pain no abdominal pain no change about habits no blood in stool ROS otherwise negative    Objective:   Physical Exam  Alert no apparent distress. Lungs clear. Heart regular in rhythm. 9 specific blotchy rash on lower torso groin and lower extremities. Feet pulses intact good not particularly strong. Chronic venous stasis changes. Venous dilatation noted. The slight crepitations. Right lateral ankle pain sensation      Assessment & Plan:  Impression 1 muscle skeletal pain right lateral ankle. #2 venous insufficiency discussed #3 nonspecific rash discussed #4 sense of leg fatigue constipation since starting statins. Discussed plan 25 minutes spent most in discussion. Maintain same meds. Followup with specialist as recommended. Maintain exercise level. Recheck in 6 months. Also encouraged wellness exam WSL

## 2014-02-07 ENCOUNTER — Ambulatory Visit: Payer: BC Managed Care – PPO | Admitting: Cardiology

## 2014-02-09 ENCOUNTER — Institutional Professional Consult (permissible substitution): Payer: BC Managed Care – PPO | Admitting: Pulmonary Disease

## 2014-02-13 ENCOUNTER — Institutional Professional Consult (permissible substitution): Payer: BC Managed Care – PPO | Admitting: Pulmonary Disease

## 2014-02-15 ENCOUNTER — Institutional Professional Consult (permissible substitution): Payer: BC Managed Care – PPO | Admitting: Pulmonary Disease

## 2014-02-17 ENCOUNTER — Ambulatory Visit: Payer: BC Managed Care – PPO | Admitting: Cardiology

## 2014-02-25 ENCOUNTER — Encounter (HOSPITAL_COMMUNITY): Payer: Self-pay | Admitting: Emergency Medicine

## 2014-02-25 ENCOUNTER — Emergency Department (HOSPITAL_COMMUNITY)
Admission: EM | Admit: 2014-02-25 | Discharge: 2014-02-25 | Disposition: A | Discharge status: Home / Self Care | Payer: BC Managed Care – PPO | Attending: Emergency Medicine | Admitting: Emergency Medicine

## 2014-02-25 DIAGNOSIS — Z87891 Personal history of nicotine dependence: Secondary | ICD-10-CM | POA: Insufficient documentation

## 2014-02-25 DIAGNOSIS — E785 Hyperlipidemia, unspecified: Secondary | ICD-10-CM | POA: Insufficient documentation

## 2014-02-25 DIAGNOSIS — Z951 Presence of aortocoronary bypass graft: Secondary | ICD-10-CM | POA: Insufficient documentation

## 2014-02-25 DIAGNOSIS — I10 Essential (primary) hypertension: Secondary | ICD-10-CM | POA: Insufficient documentation

## 2014-02-25 DIAGNOSIS — R21 Rash and other nonspecific skin eruption: Secondary | ICD-10-CM

## 2014-02-25 DIAGNOSIS — J4489 Other specified chronic obstructive pulmonary disease: Secondary | ICD-10-CM | POA: Insufficient documentation

## 2014-02-25 DIAGNOSIS — Z79899 Other long term (current) drug therapy: Secondary | ICD-10-CM | POA: Insufficient documentation

## 2014-02-25 DIAGNOSIS — J449 Chronic obstructive pulmonary disease, unspecified: Secondary | ICD-10-CM | POA: Insufficient documentation

## 2014-02-25 DIAGNOSIS — I2510 Atherosclerotic heart disease of native coronary artery without angina pectoris: Secondary | ICD-10-CM | POA: Insufficient documentation

## 2014-02-25 MED ORDER — FAMOTIDINE IN NACL 20-0.9 MG/50ML-% IV SOLN
20.0000 mg | Freq: Once | INTRAVENOUS | Status: AC
  Administered 2014-02-25: 20 mg via INTRAVENOUS
  Filled 2014-02-25: qty 50

## 2014-02-25 MED ORDER — DEXAMETHASONE SODIUM PHOSPHATE 10 MG/ML IJ SOLN
10.0000 mg | Freq: Once | INTRAMUSCULAR | Status: AC
  Administered 2014-02-25: 10 mg via INTRAVENOUS
  Filled 2014-02-25: qty 1

## 2014-02-25 MED ORDER — HYDROXYZINE HCL 25 MG PO TABS: 25.0000 mg | ORAL_TABLET | Freq: Four times a day (QID) | ORAL | Status: DC | PRN

## 2014-02-25 NOTE — ED Notes (Signed)
PT C/O RASH. PT HAS TAKEN TEMOBATE AND PREDNISONE WITHOUT RELIEF.

## 2014-02-26 NOTE — ED Provider Notes (Signed)
CSN: [id number]     Arrival date & time 02/25/14  1938 History   First MD Initiated Contact with Patient 02/25/14 2036     Chief Complaint  Patient presents with  . Rash     Patient is a 63 y.o. male presenting with rash. The history is provided by the patient.  Rash Associated symptoms: no diarrhea, no fever, no shortness of breath and not vomiting    Patient he developed rash to his bilateral LE over 8 weeks ago.  He reports it progressively worsened, and was seen recently by a dermatologist.  He was given topical steroids, and soon after rash/bruising developed in inner thighs.  This has since resolved, but now the erythematous rash is worse on his lower legs and now includes his bilateral arms.  His dermatologist also ordered oral prednisone but this has not improved the rash.  He reports intense pruritus at this time despite home benadryl.  He reports his course is worsening.  He denies any other new meds.  He is not aware of any tick bites or other possible exposures.  He denies cp/sob.  No angioedema is reported No diarrhea/syncope is reported   Past Medical History  Diagnosis Date  . Hypertension   . Hyperlipidemia   . COPD (chronic obstructive pulmonary disease)   . CAD (coronary artery disease)   . Insomnia    Past Surgical History  Procedure Laterality Date  . Coronary artery bypass graft     History reviewed. No pertinent family history. History  Substance Use Topics  . Smoking status: Former Research scientist (life sciences)  . Smokeless tobacco: Former Systems developer    Quit date: 01/13/1998  . Alcohol Use: No    Review of Systems  Constitutional: Negative for fever.  Respiratory: Negative for shortness of breath.   Cardiovascular: Negative for chest pain.  Gastrointestinal: Negative for vomiting and diarrhea.  Skin: Positive for rash.  Allergic/Immunologic: Negative for food allergies.  Neurological: Negative for syncope.  All other systems reviewed and are negative.      Allergies   Norvasc and Statins  Home Medications   Current Outpatient Rx  Name  Route  Sig  Dispense  Refill  . albuterol (PROVENTIL HFA;VENTOLIN HFA) 108 (90 BASE) MCG/ACT inhaler   Inhalation   Inhale 2 puffs into the lungs every 4 (four) hours as needed for wheezing.   3 Inhaler   1     Please give a 90 day supply   . baci-polymyx-neo-hydrocort (CORTISPORIN) 1 % ointment      every 4 (four) hours.         . enalapril (VASOTEC) 10 MG tablet   Oral   Take 1 tablet (10 mg total) by mouth daily.   90 tablet   1   . ezetimibe-simvastatin (VYTORIN) 10-40 MG per tablet   Oral   Take 1 tablet by mouth at bedtime.   90 tablet   1   . fenofibrate 160 MG tablet   Oral   Take 1 tablet (160 mg total) by mouth daily.   90 tablet   1   . fish oil-omega-3 fatty acids 1000 MG capsule   Oral   Take 2 g by mouth daily.         . metoprolol (LOPRESSOR) 50 MG tablet   Oral   Take 1 tablet (50 mg total) by mouth 2 (two) times daily.   180 tablet   1   . zolpidem (AMBIEN) 10 MG tablet   Oral  Take 1 tablet (10 mg total) by mouth at bedtime as needed for sleep.   90 tablet   1   . hydrOXYzine (ATARAX/VISTARIL) 25 MG tablet   Oral   Take 1 tablet (25 mg total) by mouth every 6 (six) hours as needed for itching.   12 tablet   0    BP 141/77  Pulse 74  Temp(Src) 98.4 F (36.9 C)  Resp 20  Ht 6\' 1"  (1.854 m)  Wt 215 lb (97.523 kg)  BMI 28.37 kg/m2  SpO2 96% Physical Exam CONSTITUTIONAL: Well developed/well nourished HEAD: Normocephalic/atraumatic EYES: EOMI/PERRL ENMT: Mucous membranes moist, no angioedema noted NECK: supple no meningeal signs CV: S1/S2 noted, no murmurs/rubs/gallops noted LUNGS: Lungs are clear to auscultation bilaterally, no apparent distress, no wheezing noted ABDOMEN: soft, nontender, no rebound or guarding Gu - no bruising noted to inner thighs/scrotum NEURO: Pt is awake/alert, moves all extremitiesx4, pt ambulatory without  difficulty EXTREMITIES: pulses normal, full ROM SKIN: warm, color normal, erythematous rash most c/w urticaria noted to arms/legs.   PSYCH: no abnormalities of mood noted  ED Course  Procedures   Pt most concerned with his intense pruritus.  This appears allergic in nature but no signs of anaphylaxis.   He does not feel benadryl is working, will start atarax.  He does not feel prednisone is working, will give dose of decadron.  Also gave pepcid.   Advised f/u with his dermatologist Pt is driving, IV benadryl not given   MDM   Final diagnoses:  Rash    Nursing notes including past medical history and social history reviewed and considered in documentation     Sharyon Cable, MD 02/26/14 [phone]

## 2014-03-10 ENCOUNTER — Ambulatory Visit: Payer: BC Managed Care – PPO | Admitting: Cardiology

## 2014-03-20 ENCOUNTER — Institutional Professional Consult (permissible substitution): Payer: BC Managed Care – PPO | Admitting: Pulmonary Disease

## 2014-03-28 ENCOUNTER — Telehealth: Payer: Self-pay | Admitting: *Deleted

## 2014-03-28 NOTE — Telephone Encounter (Signed)
Pt's wife called stated he has had a dull pain on left side for the past 3 weeks and pain in left testicle. Advised wife that he needs to be seen today. Advised wife to take him directly to the emergency room. Wife agreed to take him.

## 2014-04-05 ENCOUNTER — Encounter: Payer: Self-pay | Admitting: Family Medicine

## 2014-04-05 ENCOUNTER — Ambulatory Visit (INDEPENDENT_AMBULATORY_CARE_PROVIDER_SITE_OTHER): Payer: BC Managed Care – PPO | Admitting: Family Medicine

## 2014-04-05 VITALS — BP 138/90 | Temp 98.3°F | Ht 73.0 in | Wt 207.0 lb

## 2014-04-05 DIAGNOSIS — R10A2 Flank pain, left side: Secondary | ICD-10-CM

## 2014-04-05 DIAGNOSIS — R109 Unspecified abdominal pain: Secondary | ICD-10-CM

## 2014-04-05 DIAGNOSIS — R1032 Left lower quadrant pain: Secondary | ICD-10-CM

## 2014-04-05 LAB — POCT URINALYSIS DIPSTICK
SPEC GRAV UA: 1.01
pH, UA: 6.5

## 2014-04-05 MED ORDER — HYDROCODONE-ACETAMINOPHEN 5-325 MG PO TABS: ORAL_TABLET | ORAL | Status: DC

## 2014-04-05 NOTE — Progress Notes (Signed)
   Subjective:    Patient ID: Dennis [name], male    DOB: 20-May-1951, 63 y.o.   MRN: [id number]  Flank Pain This is a new problem. Episode onset: 3 weeks ago. The problem occurs constantly. The quality of the pain is described as aching. The pain does not radiate. The symptoms are aggravated by bending. (Left testicle pain. Sensitive to touch.) He has tried NSAIDs for the symptoms. The treatment provided mild relief.   Started aching in the testicle and dull pain in the left side  Tolerated it the  Pain intensifying over the past few wks  Calms down with rest,  Hx of vasectomy--left some scar tissue  Hx of prostate infxn, ho hx of kid stone  No fever no chills no vomiting   No gross hematuria. Review of Systems  Genitourinary: Positive for flank pain.   no chest pain no shortness of breath no abdominal pain ROS otherwise negative     Objective:   Physical Exam  Alert no acute distress. Lungs no crackles or wheezes heart regular in rhythm. No true CVA tenderness. Spine nontender. Chest wall nontender. Abdomen excellent bowel sounds no masses no rebound no guarding some left lower quadrant discomfort. Testicles normal. Some palpable scar tissue at vasectomy site. No hernias. Prostate no obvious abnormalities.  Urinalysis normal.      Assessment & Plan:  Impression somewhat puzzling presentation of flank lateral abdominal pain radiating to testicle. For all purposes sounds a lot like kidney stone however no hematuria present gross or under microscope. Plan will go ahead and scan abdomen and pelvis. I think this is a good idea with several weeks' duration of symptomatology. Further recommendations based results. Discussed at length with patient. WSL

## 2014-04-06 ENCOUNTER — Ambulatory Visit (HOSPITAL_COMMUNITY)
Admission: RE | Admit: 2014-04-06 | Discharge: 2014-04-06 | Disposition: A | Discharge status: Home / Self Care | Payer: BC Managed Care – PPO | Source: Ambulatory Visit | Attending: Family Medicine | Admitting: Family Medicine

## 2014-04-06 DIAGNOSIS — N509 Disorder of male genital organs, unspecified: Secondary | ICD-10-CM | POA: Insufficient documentation

## 2014-04-06 DIAGNOSIS — M5126 Other intervertebral disc displacement, lumbar region: Secondary | ICD-10-CM | POA: Insufficient documentation

## 2014-04-06 DIAGNOSIS — I700 Atherosclerosis of aorta: Secondary | ICD-10-CM | POA: Insufficient documentation

## 2014-04-06 DIAGNOSIS — R109 Unspecified abdominal pain: Secondary | ICD-10-CM | POA: Insufficient documentation

## 2014-04-06 LAB — POCT I-STAT CREATININE: Creatinine, Ser: 1.5 mg/dL — ABNORMAL HIGH (ref 0.50–1.35)

## 2014-04-06 MED ORDER — IOHEXOL 300 MG/ML  SOLN
100.0000 mL | Freq: Once | INTRAMUSCULAR | Status: AC | PRN
  Administered 2014-04-06: 100 mL via INTRAVENOUS

## 2014-04-07 NOTE — Progress Notes (Signed)
Patient notified and verbalized understanding of the test results. Transferred up front to make an appt next week per Dr. Richardson Landry.

## 2014-04-10 ENCOUNTER — Encounter: Payer: Self-pay | Admitting: Family Medicine

## 2014-04-10 ENCOUNTER — Ambulatory Visit (INDEPENDENT_AMBULATORY_CARE_PROVIDER_SITE_OTHER): Payer: BC Managed Care – PPO | Admitting: Family Medicine

## 2014-04-10 VITALS — BP 128/82 | Ht 73.0 in | Wt 210.0 lb

## 2014-04-10 DIAGNOSIS — R109 Unspecified abdominal pain: Secondary | ICD-10-CM

## 2014-04-10 NOTE — Progress Notes (Signed)
   Subjective:    Patient ID: Dennis [name], male    DOB: [birth year], 63 y.o.   MRN: [id number]  HPI Patient is here today for a f/u on flank pain from 5/13.  He said he is not in as much pain as he was.  Would like to discuss the results from the CT.  No new questions/concerns.  Discomfort overall is quite a bit better. Patient may go back to work on his usual schedule. Review of Systems No fever no vomiting and no change in bowel habits no blood in stool no weight loss no weight gain no hematuria ROS otherwise negative.    Objective:   Physical Exam  Alert no acute distress. HEENT normal. Lungs clear. Heart rare rhythm. No [place] Terr[place]. Abdomen benign.      Assessment & Plan:  Impression 1 subacute abdominal flank pain improved. CT scan discussed at length. Potential musculoskeletal versus neuropathic. #2 L4-L5 disc herniation discussed. Not a source of recurrent pain. #3 liver scan abnormality discussed. Plan followup as scheduled. Diet exercise discussed. Warning signs discussed. WSL

## 2014-07-17 ENCOUNTER — Encounter: Payer: Self-pay | Admitting: Cardiology

## 2014-07-17 ENCOUNTER — Ambulatory Visit (INDEPENDENT_AMBULATORY_CARE_PROVIDER_SITE_OTHER): Payer: BC Managed Care – PPO | Admitting: Cardiology

## 2014-07-17 VITALS — BP 148/98 | HR 70 | Ht 73.0 in | Wt 211.0 lb

## 2014-07-17 DIAGNOSIS — I4892 Unspecified atrial flutter: Secondary | ICD-10-CM

## 2014-07-17 DIAGNOSIS — I483 Typical atrial flutter: Secondary | ICD-10-CM

## 2014-07-17 DIAGNOSIS — J449 Chronic obstructive pulmonary disease, unspecified: Secondary | ICD-10-CM

## 2014-07-17 DIAGNOSIS — I2510 Atherosclerotic heart disease of native coronary artery without angina pectoris: Secondary | ICD-10-CM

## 2014-07-17 DIAGNOSIS — E785 Hyperlipidemia, unspecified: Secondary | ICD-10-CM

## 2014-07-17 DIAGNOSIS — J4489 Other specified chronic obstructive pulmonary disease: Secondary | ICD-10-CM

## 2014-07-17 DIAGNOSIS — R002 Palpitations: Secondary | ICD-10-CM

## 2014-07-17 DIAGNOSIS — I10 Essential (primary) hypertension: Secondary | ICD-10-CM

## 2014-07-17 NOTE — Assessment & Plan Note (Signed)
Blood pressure elevated today. Patient states that systolic pressure is usually in the 120s to 130s at home.

## 2014-07-17 NOTE — Progress Notes (Signed)
Clinical Summary [name] is a 63 y.o.male former patient of Dr. Verl Blalock not seen since 2012, presenting for a routine visit. This is our first meeting in the office. I reviewed available records. He is here with his wife today.  He has worked at [employer] for the last 36 years doing swing shifts. States that he might retire sometime next year. From a symptom perspective, he describes more consistent fatigue, tiredness at work, also increasing trouble with his COPD and breathing status. He reports "electrical" feelings in his chest, cannot be more specific. Possible sense of palpitations. No dizziness or syncope. He does not endorse any angina symptoms or nitroglycerin use.  Exercise Cardiolite from February 2012 showed no diagnostic ST segment changes, inferior scar versus attenuation, no ischemia, LVEF 62%.  Lipid panel from February of this year showed cholesterol 256, triglycerides 176, HDL 45, LDL 178. He reports problems with statins in the past, although seems to be able to tolerate Vytorin. LDL has not been optimally controlled over time.   ECG shows rate-controlled atrial flutter with 3:1 block. Do not have any recent tracings for comparison, the patient was in sinus rhythm at his last stress test in 2012.   Allergies  Allergen Reactions  . Norvasc [Amlodipine Besylate]     Muscle weakness  . Statins     Side effects to high dose/muscle weakness    Current Outpatient Prescriptions  Medication Sig Dispense Refill  . albuterol (PROVENTIL HFA;VENTOLIN HFA) 108 (90 BASE) MCG/ACT inhaler Inhale 2 puffs into the lungs every 4 (four) hours as needed for wheezing.  3 Inhaler  1  . aspirin 81 MG tablet Take 81 mg by mouth daily.      . enalapril (VASOTEC) 10 MG tablet Take 1 tablet (10 mg total) by mouth daily.  90 tablet  1  . ezetimibe-simvastatin (VYTORIN) 10-40 MG per tablet Take 1 tablet by mouth at bedtime.  90 tablet  1  . fenofibrate 160 MG tablet Take 1 tablet (160 mg  total) by mouth daily.  90 tablet  1  . fish oil-omega-3 fatty acids 1000 MG capsule Take 2 g by mouth daily.      . metoprolol (LOPRESSOR) 50 MG tablet Take 1 tablet (50 mg total) by mouth 2 (two) times daily.  180 tablet  1  . zolpidem (AMBIEN) 10 MG tablet Take 1 tablet (10 mg total) by mouth at bedtime as needed for sleep.  90 tablet  1   No current facility-administered medications for this visit.    Past Medical History  Diagnosis Date  . Essential hypertension, benign   . Hyperlipidemia   . COPD (chronic obstructive pulmonary disease)   . Coronary atherosclerosis of native coronary artery     Multivessel status post CABG  . Insomnia   . Atrial flutter     Diagnosed by ECG August 2015    Past Surgical History  Procedure Laterality Date  . Coronary artery bypass graft  2005    Dr. Lucianne Lei Trigt: LIMA to LAD, right radial to circumflex, SVG to RCA    Family History  Problem Relation Age of Onset  . Hypertension Mother     Social History [name] reports that he has quit smoking. His smoking use included Cigarettes. He smoked 0.00 packs per day. He quit smokeless tobacco use about 16 years ago. [name] reports that he does not drink alcohol.  Review of Systems No dizziness or syncope. Stable appetite. No orthopnea  or PND. No fevers or chills. Wheezes intermittently and uses inhalers at least a few days a week. No hospitalizations with respiratory failure or ventilator support. Other systems reviewed and negative except as outlined.  Physical Examination Filed Vitals:   07/17/14 0840  BP: 148/98  Pulse: 70   Filed Weights   07/17/14 0840  Weight: 211 lb (95.709 kg)   Patient appears comfortable at rest. HEENT: Conjunctiva and lids normal, oropharynx clear. Neck: Supple, no elevated JVP or carotid bruits, no thyromegaly. Lungs: Decreased breath sounds throughout without wheezing, nonlabored breathing at rest. Cardiac: Largely regular rate and rhythm, distant  heart sounds, no S3 or significant systolic murmur, no pericardial rub. Abdomen: Soft, nontender, bowel sounds present, no guarding or rebound. Extremities: Trace ankle edema, distal pulses 2+. Skin: Warm and dry. Musculoskeletal: No kyphosis. Neuropsychiatric: Alert and oriented x3, affect grossly appropriate.   Problem List and Plan   Coronary atherosclerosis of native coronary artery History of multivessel disease status post CABG in 2005 as outlined above. She reports no angina, however has been increasingly more fatigued with activity, states somewhat similar to symptoms he had prior revascularization. Last ischemic workup was 3 years ago. Plan will be to proceed with a Lexiscan Cardiolite on medical therapy for followup objective ischemic assessment.  Atrial flutter Newly diagnosed, duration uncertain. Could potentially be contributing to some of his fatigue and vague sense of palpitations as well. CHADSVASC score is 2 at this point. We did discuss considerations for anticoagulation for stroke prophylaxis, at this point he was willing to take an aspirin daily, which he had not been doing for several months. We will obtain a 24-hour Holter monitor to better assess heart rate variability, and then can consider the next step once ischemic and structural cardiac testing are completed.  COPD The patient states that this is "severe" and although I am not certain about objective testing. He does not follow a pulmonologist. This could also be contributing to atrial arrhythmia.  Essential hypertension, benign Blood pressure elevated today. Patient states that systolic pressure is usually in the 120s to 130s at home.  HYPERLIPIDEMIA LDL 178 as of February. He has previous history of statin intolerances, describing both "liver problems" and also myalgias. He has been on Vytorin most recently as well as omega-3 supplements and fenofibrate, tolerating this regimen, although LDL is not optimal. I  offered referral to the lipid clinic for further evaluation, he declined for now.    Satira Sark, M.D., F.A.C.C.

## 2014-07-17 NOTE — Patient Instructions (Signed)
Your physician recommends that you schedule a follow-up appointment in: after testing   Your physician has recommended you make the following change in your medication:   START Aspirin 81 mg daily  Your physician has requested that you have an echocardiogram. Echocardiography is a painless test that uses sound waves to create images of your heart. It provides your doctor with information about the size and shape of your heart and how well your heart's chambers and valves are working. This procedure takes approximately one hour. There are no restrictions for this procedure.    Your physician has recommended that you wear a 24 hr holter monitor. Holter monitors are medical devices that record the heart's electrical activity. Doctors most often use these monitors to diagnose arrhythmias. Arrhythmias are problems with the speed or rhythm of the heartbeat. The monitor is a small, portable device. You can wear one while you do your normal daily activities. This is usually used to diagnose what is causing palpitations/syncope (passing out).   Your physician has requested that you have a lexiscan myoview. For further information please visit [email or web address]. Please follow instruction sheet, as given.       Thank you for choosing [facility] !

## 2014-07-17 NOTE — Assessment & Plan Note (Signed)
History of multivessel disease status post CABG in 2005 as outlined above. She reports no angina, however has been increasingly more fatigued with activity, states somewhat similar to symptoms he had prior revascularization. Last ischemic workup was 3 years ago. Plan will be to proceed with a Lexiscan Cardiolite on medical therapy for followup objective ischemic assessment.

## 2014-07-17 NOTE — Assessment & Plan Note (Signed)
Newly diagnosed, duration uncertain. Could potentially be contributing to some of his fatigue and vague sense of palpitations as well. CHADSVASC score is 2 at this point. We did discuss considerations for anticoagulation for stroke prophylaxis, at this point he was willing to take an aspirin daily, which he had not been doing for several months. We will obtain a 24-hour Holter monitor to better assess heart rate variability, and then can consider the next step once ischemic and structural cardiac testing are completed.

## 2014-07-17 NOTE — Assessment & Plan Note (Signed)
LDL 178 as of February. He has previous history of statin intolerances, describing both "liver problems" and also myalgias. He has been on Vytorin most recently as well as omega-3 supplements and fenofibrate, tolerating this regimen, although LDL is not optimal. I offered referral to the lipid clinic for further evaluation, he declined for now.

## 2014-07-17 NOTE — Assessment & Plan Note (Addendum)
The patient states that this is "severe" and although I am not certain about objective testing. He does not follow a pulmonologist. This could also be contributing to atrial arrhythmia.

## 2014-07-24 ENCOUNTER — Encounter (HOSPITAL_COMMUNITY): Payer: Self-pay

## 2014-07-24 ENCOUNTER — Encounter (HOSPITAL_COMMUNITY)
Admission: RE | Admit: 2014-07-24 | Discharge: 2014-07-24 | Disposition: A | Discharge status: Home / Self Care | Payer: BC Managed Care – PPO | Source: Ambulatory Visit | Attending: Cardiology | Admitting: Cardiology

## 2014-07-24 ENCOUNTER — Ambulatory Visit (HOSPITAL_COMMUNITY)
Admission: RE | Admit: 2014-07-24 | Discharge: 2014-07-24 | Disposition: A | Discharge status: Home / Self Care | Payer: BC Managed Care – PPO | Source: Ambulatory Visit | Attending: Cardiology | Admitting: Cardiology

## 2014-07-24 DIAGNOSIS — R0989 Other specified symptoms and signs involving the circulatory and respiratory systems: Secondary | ICD-10-CM | POA: Diagnosis present

## 2014-07-24 DIAGNOSIS — R002 Palpitations: Secondary | ICD-10-CM | POA: Insufficient documentation

## 2014-07-24 DIAGNOSIS — J4489 Other specified chronic obstructive pulmonary disease: Secondary | ICD-10-CM | POA: Diagnosis not present

## 2014-07-24 DIAGNOSIS — R0609 Other forms of dyspnea: Secondary | ICD-10-CM | POA: Insufficient documentation

## 2014-07-24 DIAGNOSIS — I4892 Unspecified atrial flutter: Secondary | ICD-10-CM | POA: Diagnosis not present

## 2014-07-24 DIAGNOSIS — I079 Rheumatic tricuspid valve disease, unspecified: Secondary | ICD-10-CM | POA: Diagnosis not present

## 2014-07-24 DIAGNOSIS — I059 Rheumatic mitral valve disease, unspecified: Secondary | ICD-10-CM

## 2014-07-24 DIAGNOSIS — I2510 Atherosclerotic heart disease of native coronary artery without angina pectoris: Secondary | ICD-10-CM | POA: Insufficient documentation

## 2014-07-24 DIAGNOSIS — Z87891 Personal history of nicotine dependence: Secondary | ICD-10-CM | POA: Diagnosis not present

## 2014-07-24 DIAGNOSIS — I10 Essential (primary) hypertension: Secondary | ICD-10-CM | POA: Insufficient documentation

## 2014-07-24 DIAGNOSIS — J449 Chronic obstructive pulmonary disease, unspecified: Secondary | ICD-10-CM | POA: Diagnosis not present

## 2014-07-24 DIAGNOSIS — I4891 Unspecified atrial fibrillation: Secondary | ICD-10-CM | POA: Insufficient documentation

## 2014-07-24 DIAGNOSIS — E785 Hyperlipidemia, unspecified: Secondary | ICD-10-CM | POA: Insufficient documentation

## 2014-07-24 MED ORDER — SODIUM CHLORIDE 0.9 % IJ SOLN
10.0000 mL | INTRAMUSCULAR | Status: DC | PRN
  Administered 2014-07-24: 10 mL via INTRAVENOUS

## 2014-07-24 MED ORDER — TECHNETIUM TC 99M SESTAMIBI GENERIC - CARDIOLITE
10.0000 | Freq: Once | INTRAVENOUS | Status: AC | PRN
  Administered 2014-07-24: 10 via INTRAVENOUS

## 2014-07-24 MED ORDER — SODIUM CHLORIDE 0.9 % IJ SOLN
INTRAMUSCULAR | Status: AC
  Administered 2014-07-24: 10 mL via INTRAVENOUS
  Filled 2014-07-24: qty 10

## 2014-07-24 MED ORDER — TECHNETIUM TC 99M SESTAMIBI - CARDIOLITE
30.0000 | Freq: Once | INTRAVENOUS | Status: AC | PRN
Start: 2014-07-24 — End: 2014-07-24
  Administered 2014-07-24: 30 via INTRAVENOUS

## 2014-07-24 MED ORDER — REGADENOSON 0.4 MG/5ML IV SOLN
0.4000 mg | Freq: Once | INTRAVENOUS | Status: AC | PRN
  Administered 2014-07-24: 0.4 mg via INTRAVENOUS

## 2014-07-24 MED ORDER — REGADENOSON 0.4 MG/5ML IV SOLN
INTRAVENOUS | Status: AC
  Administered 2014-07-24: 0.4 mg via INTRAVENOUS
  Filled 2014-07-24: qty 5

## 2014-07-24 NOTE — Progress Notes (Signed)
  Echocardiogram 2D Echocardiogram has been performed.  [place], [place] 07/24/2014, 9:16 AM

## 2014-07-24 NOTE — Progress Notes (Signed)
Stress Lab Nurses Notes - Dennis [name]  Dennis [name] 07/24/2014 Reason for doing test: CAD and Palpitation Type of test: Lynford Humphrey Nurse performing test: Gerrit Halls, RN Nuclear Medicine Tech: Melburn Hake Echo Tech: Not Applicable MD performing test: Branch/K.Purcell Nails NP Family MD: Mickie Hillier Test explained and consent signed: Yes.   IV started: Saline lock flushed, No redness or edema and Saline lock started in radiology Symptoms: SOB & pressure in chest Treatment/Intervention: None Reason test stopped: protocol completed After recovery IV was: Discontinued via X-ray tech and No redness or edema Patient to return to Nuc. Med at :12:15 Patient discharged: Home Patient's Condition upon discharge was: stable Comments: During test BP 118/79 & HR 112.  Recovery BP 120/75 & HR 83.  Symptoms resolved in recovery.  Geanie Cooley T

## 2014-07-24 NOTE — Progress Notes (Signed)
24 hr Holter Monitor in progress. 

## 2014-07-25 ENCOUNTER — Ambulatory Visit (HOSPITAL_COMMUNITY): Payer: BC Managed Care – PPO

## 2014-07-25 ENCOUNTER — Encounter (HOSPITAL_COMMUNITY): Payer: BC Managed Care – PPO

## 2014-08-02 ENCOUNTER — Other Ambulatory Visit: Payer: Self-pay | Admitting: Family Medicine

## 2014-08-07 ENCOUNTER — Encounter: Payer: Self-pay | Admitting: Cardiology

## 2014-08-07 ENCOUNTER — Telehealth: Payer: Self-pay | Admitting: Family Medicine

## 2014-08-07 ENCOUNTER — Ambulatory Visit (INDEPENDENT_AMBULATORY_CARE_PROVIDER_SITE_OTHER): Payer: BC Managed Care – PPO | Admitting: Cardiology

## 2014-08-07 VITALS — BP 122/88 | HR 88 | Ht 73.0 in | Wt 209.0 lb

## 2014-08-07 DIAGNOSIS — I483 Typical atrial flutter: Secondary | ICD-10-CM

## 2014-08-07 DIAGNOSIS — I2510 Atherosclerotic heart disease of native coronary artery without angina pectoris: Secondary | ICD-10-CM

## 2014-08-07 DIAGNOSIS — E785 Hyperlipidemia, unspecified: Secondary | ICD-10-CM

## 2014-08-07 DIAGNOSIS — I10 Essential (primary) hypertension: Secondary | ICD-10-CM

## 2014-08-07 DIAGNOSIS — I4892 Unspecified atrial flutter: Secondary | ICD-10-CM

## 2014-08-07 DIAGNOSIS — E782 Mixed hyperlipidemia: Secondary | ICD-10-CM

## 2014-08-07 DIAGNOSIS — Z79899 Other long term (current) drug therapy: Secondary | ICD-10-CM

## 2014-08-07 NOTE — Telephone Encounter (Signed)
Blood work ordered in Epic. Patient notified. 

## 2014-08-07 NOTE — Patient Instructions (Signed)
Your physician recommends that you schedule a follow-up appointment in:  3 months    Your physician recommends that you continue on your current medications as directed. Please refer to the Current Medication list given to you today.     Thank you for choosing [facility] !   

## 2014-08-07 NOTE — Assessment & Plan Note (Signed)
Continues on Vytorin and omega-3 supplements. Keep follow with Dr. [name].

## 2014-08-07 NOTE — Assessment & Plan Note (Signed)
Recent Cardiolite study shows no active ischemia with LVEF 51%. Plan will be to continue medical therapy and observation. He is on aspirin and statin therapy.

## 2014-08-07 NOTE — Telephone Encounter (Signed)
Patient had Lipid, Liver, Met7, PSA- 12/29/13

## 2014-08-07 NOTE — Assessment & Plan Note (Signed)
Blood pressure is well-controlled today. 

## 2014-08-07 NOTE — Telephone Encounter (Signed)
bw orders please for future appt   Call when sent

## 2014-08-07 NOTE — Telephone Encounter (Signed)
Lip liv glu 

## 2014-08-07 NOTE — Assessment & Plan Note (Signed)
Persistent, reasonably well rate controlled, duration uncertain. Not entirely clear how symptom provoking this is. CHADSVASC score is 2. For now he is comfortable staying on aspirin and beta blocker, would like to hold off on considering cardioversion or anticoagulant. We will review this with him over time. Followup in 3 months.

## 2014-08-07 NOTE — Progress Notes (Signed)
Clinical Summary Dennis [name] is a 63 y.o.male seen by me for the first time in August, a former patient of Dr. Verl Blalock. At that time he was describing fatigue, very atypical chest pain symptoms, and was also noted to be in rate-controlled atrial flutter of uncertain duration. We arranged followup testing.  Lexiscan Cardiolite from August showed no diagnostic ST segment abnormalities, no perfusion evidence of ischemia, LVEF 51%. 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 24 hour Holter monitor showed atrial flutter with largely controlled heart rates, particularly with described symptoms. We reviewed the results in detail.  CHADSVASC score is 2. He continues on aspirin and beta blocker at this time.  He tells that he is under a lot of stress due to family issues, also work, he will probably be retiring around the first of the year. We discussed the possibility of considering an elective cardioversion to see if he feels any better back in sinus rhythm. For now he wanted to hold off.    Allergies  Allergen Reactions  . Norvasc [Amlodipine Besylate]     Muscle weakness  . Statins     Side effects to high dose/muscle weakness    Current Outpatient Prescriptions  Medication Sig Dispense Refill  . albuterol (PROVENTIL HFA;VENTOLIN HFA) 108 (90 BASE) MCG/ACT inhaler Inhale 2 puffs into the lungs every 4 (four) hours as needed for wheezing.  3 Inhaler  1  . aspirin 81 MG tablet Take 81 mg by mouth daily.      . enalapril (VASOTEC) 10 MG tablet Take 1 tablet (10 mg total) by mouth daily.  90 tablet  1  . ezetimibe-simvastatin (VYTORIN) 10-40 MG per tablet Take 1 tablet by mouth at bedtime.  90 tablet  1  . fenofibrate 160 MG tablet TAKE 1 TABLET BY MOUTH EVERY DAY  90 tablet  0  . fish oil-omega-3 fatty acids 1000 MG capsule Take 2 g by mouth daily.      . metoprolol  (LOPRESSOR) 50 MG tablet Take 1 tablet (50 mg total) by mouth 2 (two) times daily.  180 tablet  1  . zolpidem (AMBIEN) 10 MG tablet Take 1 tablet (10 mg total) by mouth at bedtime as needed for sleep.  90 tablet  1   No current facility-administered medications for this visit.    Past Medical History  Diagnosis Date  . Essential hypertension, benign   . Hyperlipidemia   . COPD (chronic obstructive pulmonary disease)   . Coronary atherosclerosis of native coronary artery     Multivessel status post CABG  . Insomnia   . Atrial flutter     Diagnosed by ECG August 2015    Past Surgical History  Procedure Laterality Date  . Coronary artery bypass graft  2005    Dr. Lucianne Lei Trigt: LIMA to LAD, right radial to circumflex, SVG to RCA    Family History  Problem Relation Age of Onset  . Hypertension Mother     Social History Dennis [name] reports that he has quit smoking. His smoking use included Cigarettes. He smoked 0.00 packs per day. He quit smokeless tobacco use about 16 years ago. Dennis [name] reports that he does not drink alcohol.  Review of Systems No angina symptoms. Difficulty sleeping. Other systems reviewed and negative except as outlined.  Physical Examination Filed Vitals:   08/07/14 0903  BP: 122/88  Pulse: 88   Filed  Weights   08/07/14 0903  Weight: 209 lb (94.802 kg)    Patient appears comfortable at rest.  HEENT: Conjunctiva and lids normal, oropharynx clear.  Neck: Supple, no elevated JVP or carotid bruits, no thyromegaly.  Lungs: Decreased breath sounds throughout without wheezing, nonlabored breathing at rest.  Cardiac: Largely regular rate and rhythm, distant heart sounds, no S3 or significant systolic murmur, no pericardial rub.  Abdomen: Soft, nontender, bowel sounds present, no guarding or rebound.  Extremities: Trace ankle edema, distal pulses 2+.  Skin: Warm and dry.  Musculoskeletal: No kyphosis.  Neuropsychiatric: Alert and oriented x3, affect  grossly appropriate.   Problem List and Plan   Coronary atherosclerosis of native coronary artery Recent Cardiolite study shows no active ischemia with LVEF 51%. Plan will be to continue medical therapy and observation. He is on aspirin and statin therapy.  Atrial flutter Persistent, reasonably well rate controlled, duration uncertain. Not entirely clear how symptom provoking this is. CHADSVASC score is 2. For now he is comfortable staying on aspirin and beta blocker, would like to hold off on considering cardioversion or anticoagulant. We will review this with him over time. Followup in 3 months.  HYPERLIPIDEMIA Continues on Vytorin and omega-3 supplements. Keep follow with Dr. [name].  Essential hypertension, benign Blood pressure is well controlled today.    Satira Sark, M.D., F.A.C.C.

## 2014-08-20 ENCOUNTER — Other Ambulatory Visit: Payer: Self-pay | Admitting: Family Medicine

## 2014-08-21 ENCOUNTER — Other Ambulatory Visit: Payer: Self-pay | Admitting: Family Medicine

## 2014-08-21 NOTE — Telephone Encounter (Signed)
Ok 6 mo worth 

## 2014-08-21 NOTE — Telephone Encounter (Signed)
Last seen 5/15 

## 2014-08-21 NOTE — Telephone Encounter (Signed)
Last seen 04/10/14.

## 2014-08-25 LAB — LIPID PANEL
CHOL/HDL RATIO: 5.6 ratio
Cholesterol: 254 mg/dL — ABNORMAL HIGH (ref 0–200)
HDL: 45 mg/dL (ref >39)
LDL CALC: 174 mg/dL — AB (ref 0–99)
TRIGLYCERIDES: 174 mg/dL — AB (ref <150)
VLDL: 35 mg/dL (ref 0–40)

## 2014-08-25 LAB — GLUCOSE, RANDOM: Glucose, Bld: 108 mg/dL — ABNORMAL HIGH (ref 70–99)

## 2014-08-25 LAB — HEPATIC FUNCTION PANEL
ALBUMIN: 4.2 g/dL (ref 3.5–5.2)
ALT: 16 U/L (ref 0–53)
AST: 22 U/L (ref 0–37)
Alkaline Phosphatase: 27 U/L — ABNORMAL LOW (ref 39–117)
BILIRUBIN INDIRECT: 0.4 mg/dL (ref 0.2–1.2)
Bilirubin, Direct: 0.1 mg/dL (ref 0.0–0.3)
TOTAL PROTEIN: 6.4 g/dL (ref 6.0–8.3)
Total Bilirubin: 0.5 mg/dL (ref 0.2–1.2)

## 2014-08-31 ENCOUNTER — Ambulatory Visit: Payer: BC Managed Care – PPO | Admitting: Cardiology

## 2014-09-01 ENCOUNTER — Ambulatory Visit: Payer: BC Managed Care – PPO | Admitting: Cardiology

## 2014-09-03 ENCOUNTER — Other Ambulatory Visit: Payer: Self-pay | Admitting: Family Medicine

## 2014-09-12 ENCOUNTER — Encounter: Payer: Self-pay | Admitting: Family Medicine

## 2014-09-12 ENCOUNTER — Ambulatory Visit (INDEPENDENT_AMBULATORY_CARE_PROVIDER_SITE_OTHER): Payer: BC Managed Care – PPO | Admitting: Family Medicine

## 2014-09-12 VITALS — BP 142/98

## 2014-09-12 DIAGNOSIS — M25552 Pain in left hip: Secondary | ICD-10-CM

## 2014-09-12 DIAGNOSIS — I10 Essential (primary) hypertension: Secondary | ICD-10-CM

## 2014-09-12 DIAGNOSIS — G8929 Other chronic pain: Secondary | ICD-10-CM

## 2014-09-12 DIAGNOSIS — R739 Hyperglycemia, unspecified: Secondary | ICD-10-CM

## 2014-09-12 DIAGNOSIS — I483 Typical atrial flutter: Secondary | ICD-10-CM

## 2014-09-12 DIAGNOSIS — E785 Hyperlipidemia, unspecified: Secondary | ICD-10-CM

## 2014-09-12 LAB — POCT GLYCOSYLATED HEMOGLOBIN (HGB A1C): HEMOGLOBIN A1C: 6.3

## 2014-09-12 MED ORDER — KETOCONAZOLE 2 % EX CREA: 1.0000 "application " | TOPICAL_CREAM | Freq: Two times a day (BID) | CUTANEOUS | Status: DC | PRN

## 2014-09-12 MED ORDER — CHLORZOXAZONE 500 MG PO TABS: 500.0000 mg | ORAL_TABLET | Freq: Three times a day (TID) | ORAL | Status: DC | PRN

## 2014-09-12 NOTE — Progress Notes (Signed)
   Subjective:    Patient ID: Dennis [name], male    DOB: 1951-01-04, 63 y.o.   MRN: [id number]  HPIFollow up on bloodwork results. A1C today. Patient trying to watch sugar intake.  Please see prior notes. With progressive shortness of breath we set up with a pulmonary specialist. Patient was unable to go because of his schedule and did not reschedule.   Concerns about back pain. Requesting a muscle relaxer. Intermittent low back pain muscle like in nature with spasm.  Rash in the scrotum primarily itchy at times. Over-the-counter medications not helping.   Left hip pain off and on for 2 years. Getting worse. Left leg pain and left foot numbness. Pain appears to be in the posterior left hip. Worse with certain motions worse with reclining.  Foot numbness appears to be more when standing for periods of time.  Also discussed rapid heart rate. Currently in atrial flutter. Controlled fairly stable but with some exercise intolerance.   Will get flu vaccine at work.    Ambien not lasting through the month because pt works 3rd shift. And he takes 2 a day.   Results for orders placed in visit on 09/12/14  POCT GLYCOSYLATED HEMOGLOBIN (HGB A1C)      Result Value Ref Range   Hemoglobin A1C 6.3      Review of Systems No headache no chest pain no change in bowel habits no blood in stool    Objective:   Physical Exam  Alert somewhat anxious appearing mild Morales vitals stable. HEENT normal. Lungs clear. Heart regular rate and rhythm. Posterior lateral hip tenderness to deep palpation good range of motion of hip but with pain with lateral rotation spine nontender negative straight leg raise. Distal sensation currently intact. Tinea cruris-like rash noted on scrotum      Assessment & Plan:  Impression 1 COPD with patient not following up #2 glucose intolerance A1c was 6 is #3 hyperlipidemia and discuss #4 left hip pain progressive over past 2 years #5 atrial flutter discuss rate stable.  #6 insomnia on: #7 tinea cruris discuss plan easily 40 minutes spent most and discussion. Left hip x-ray. Appropriate blood work. Maintain medications. Add ketoconazole cream. Left hip x-ray may well lead to orthopedic referral discussed with patient. W SL

## 2014-09-13 ENCOUNTER — Ambulatory Visit (HOSPITAL_COMMUNITY)
Admission: RE | Admit: 2014-09-13 | Discharge: 2014-09-13 | Disposition: A | Discharge status: Home / Self Care | Payer: BC Managed Care – PPO | Source: Ambulatory Visit | Attending: Family Medicine | Admitting: Family Medicine

## 2014-09-13 DIAGNOSIS — G8929 Other chronic pain: Secondary | ICD-10-CM | POA: Diagnosis not present

## 2014-09-13 DIAGNOSIS — M25552 Pain in left hip: Secondary | ICD-10-CM | POA: Diagnosis present

## 2014-09-15 NOTE — Progress Notes (Signed)
Patient notified and verbalized understanding of the test results. No further questions. Would like to go to [facility]. Referral placed.

## 2014-09-17 DIAGNOSIS — M25552 Pain in left hip: Secondary | ICD-10-CM

## 2014-09-17 DIAGNOSIS — G8929 Other chronic pain: Secondary | ICD-10-CM | POA: Insufficient documentation

## 2014-10-09 ENCOUNTER — Other Ambulatory Visit (HOSPITAL_COMMUNITY): Payer: Self-pay | Admitting: Orthopaedic Surgery

## 2014-10-09 DIAGNOSIS — M545 Low back pain: Secondary | ICD-10-CM

## 2014-10-12 ENCOUNTER — Ambulatory Visit (HOSPITAL_COMMUNITY): Admission: RE | Admit: 2014-10-12 | Payer: BC Managed Care – PPO | Source: Ambulatory Visit

## 2014-10-16 ENCOUNTER — Other Ambulatory Visit: Payer: Self-pay | Admitting: Family Medicine

## 2014-10-30 ENCOUNTER — Ambulatory Visit (HOSPITAL_COMMUNITY)
Admission: RE | Admit: 2014-10-30 | Discharge: 2014-10-30 | Disposition: A | Discharge status: Home / Self Care | Payer: BC Managed Care – PPO | Source: Ambulatory Visit | Attending: Orthopaedic Surgery | Admitting: Orthopaedic Surgery

## 2014-10-30 DIAGNOSIS — M25552 Pain in left hip: Secondary | ICD-10-CM | POA: Diagnosis not present

## 2014-10-30 DIAGNOSIS — M5126 Other intervertebral disc displacement, lumbar region: Secondary | ICD-10-CM | POA: Insufficient documentation

## 2014-10-30 DIAGNOSIS — M545 Low back pain: Secondary | ICD-10-CM

## 2014-11-01 ENCOUNTER — Other Ambulatory Visit: Payer: Self-pay | Admitting: Family Medicine

## 2014-11-06 ENCOUNTER — Ambulatory Visit: Payer: BC Managed Care – PPO | Admitting: Cardiology

## 2014-11-08 ENCOUNTER — Other Ambulatory Visit: Payer: Self-pay | Admitting: Family Medicine

## 2014-11-08 ENCOUNTER — Ambulatory Visit: Payer: BC Managed Care – PPO | Admitting: Cardiology

## 2014-11-16 ENCOUNTER — Ambulatory Visit: Payer: BC Managed Care – PPO | Admitting: Cardiology

## 2014-11-29 ENCOUNTER — Other Ambulatory Visit: Payer: Self-pay | Admitting: Family Medicine

## 2014-11-30 ENCOUNTER — Other Ambulatory Visit: Payer: Self-pay | Admitting: Family Medicine

## 2014-12-01 ENCOUNTER — Other Ambulatory Visit: Payer: Self-pay | Admitting: Family Medicine

## 2015-01-30 ENCOUNTER — Telehealth: Payer: Self-pay | Admitting: *Deleted

## 2015-01-30 NOTE — Telephone Encounter (Signed)
[id number] 

## 2015-01-30 NOTE — Telephone Encounter (Signed)
[id number] 

## 2015-01-30 NOTE — Telephone Encounter (Signed)
They will not be home till after 4 pm....Marland KitchenMarland KitchenPt wife called to make appointment for husband. Dr Domenic Polite first available is 03/01/15. Wife feels that the issues he is having can not be handled by the PA. Need a call to evaluate weather he needs to go to ED or not.

## 2015-01-31 NOTE — Telephone Encounter (Signed)
01/30/14 Pt called back:Has had 2 weeks of "muscle cramping" in chest and when he leans forward sx's are worse.States last night in bed he experienced similar sx's while supine.Sx's resolved on there own.Also reports he doesn't feel rested after sleeping and stays tired. Was to f/u with Dr Domenic Polite in Dec 2015 but cancelled several times to do personal issuses.Does not want to wait to see Dr Domenic Polite in [place] sooner apt. Scheduled with Arnold Long NP tomorrow at 2:10 pm

## 2015-02-01 ENCOUNTER — Ambulatory Visit (INDEPENDENT_AMBULATORY_CARE_PROVIDER_SITE_OTHER): Payer: Self-pay | Admitting: Cardiology

## 2015-02-01 ENCOUNTER — Encounter: Payer: Self-pay | Admitting: Adult Health

## 2015-02-01 VITALS — BP 120/78 | HR 96 | Ht 73.0 in | Wt 214.4 lb

## 2015-02-01 DIAGNOSIS — Z136 Encounter for screening for cardiovascular disorders: Secondary | ICD-10-CM

## 2015-02-01 DIAGNOSIS — Z658 Other specified problems related to psychosocial circumstances: Secondary | ICD-10-CM

## 2015-02-01 DIAGNOSIS — I10 Essential (primary) hypertension: Secondary | ICD-10-CM

## 2015-02-01 DIAGNOSIS — I483 Typical atrial flutter: Secondary | ICD-10-CM

## 2015-02-01 DIAGNOSIS — I2510 Atherosclerotic heart disease of native coronary artery without angina pectoris: Secondary | ICD-10-CM

## 2015-02-01 DIAGNOSIS — R0789 Other chest pain: Secondary | ICD-10-CM

## 2015-02-01 DIAGNOSIS — F439 Reaction to severe stress, unspecified: Secondary | ICD-10-CM

## 2015-02-01 NOTE — Progress Notes (Signed)
Cardiology Office Note  Date: 02/01/2015   ID: Dennis [name], DOB 1951/03/28, MRN [id number]  PCP: Rubbie Battiest, MD  Primary Cardiologist: Rozann Lesches, MD   Chief Complaint  Patient presents with  . Chest Pain  . Coronary Artery Disease    History of Present Illness: Dennis [name] is a 64 y.o. male presenting to the office today, originally scheduled to see Ms. Lawrence NP due to recent recurring chest pain symptoms. I became involved in his care while he was here in the office today. Recent records reviewed, patient informed the office by recent phone call that he has been experiencing a feeling of "muscle cramping" in his left chest area, particularly when he is at work, sometimes when he leans forward. 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 He states that when he gets up and moves around the symptoms improve. He also states that when he is off from work, he has no symptoms whatsoever.  Unrelated to the above symptoms, he states he ate some "peppers" earlier today and felt nauseated, actually had a few episodes of emesis while here in the office, reported feeling much better after that. Again, the symptoms have not been associated with his recent chest discomfort.  I met with the patient and his wife today. He seemed to be not only worried about his symptoms, but also preoccupied with the amount of stress that he is under at work, hopefully plans to retire sometime in June. He states that he dreads going back to the night shift soon, asked me at one point to write him a letter to take him "out of work for a few months." He relates a feeling of "cramping" that occurs when he is at work, usually when he is sitting at his desk and under stress, sometimes when he leans forward it seems to be worse. When he gets up and moves around, the symptoms  completely resolve. He has felt much better during the times when he is not at work. Symptoms have been worse in the last few weeks. He had one episode at home at night time, but this has typically not been the case.  Today we reviewed the results of his reassuring stress testing from August 2015. ECG today shows rate-controlled atrial flutter as before, no new ST segment abnormalities. The symptoms he describes are very atypical for ischemic chest pain, particularly in that they actually resolve when he is active. He does not have a definite pericardial rub to suggest pericarditis. It may be that symptoms are related to ongoing psychosocial stressors.  Today we discussed options including observation alone in light of his reassuring testing within the last several months and atypical symptoms, considering follow-up with Dr. Wolfgang [name] to investigate other possible etiologies, working on stress management including coming up with a plan for his retirement, and even considering follow-up ischemic evaluation. If we were to pursue this last option, an outpatient cardiac catheterization would likely provide the most definitive information regarding his cardiac status. At this particular time he elected to pursue observation only, and defer any follow-up testing.   Past Medical History  Diagnosis Date  . Essential hypertension, benign   . Hyperlipidemia   . COPD (chronic obstructive pulmonary disease)   . Coronary atherosclerosis of native coronary artery     Multivessel status post CABG  . Insomnia   .  Atrial flutter     Diagnosed by ECG August 2015    Past Surgical History  Procedure Laterality Date  . Coronary artery bypass graft  2005    Dr. Lucianne Lei Trigt: LIMA to LAD, right radial to circumflex, SVG to RCA    Current Outpatient Prescriptions  Medication Sig Dispense Refill  . albuterol (PROVENTIL HFA;VENTOLIN HFA) 108 (90 BASE) MCG/ACT inhaler Inhale 2 puffs into the lungs every 4 (four) hours as  needed for wheezing. 3 Inhaler 1  . aspirin 81 MG tablet Take 81 mg by mouth every other day.     . chlorzoxazone (PARAFON) 500 MG tablet Take 1 tablet (500 mg total) by mouth 3 (three) times daily as needed for muscle spasms. 36 tablet 0  . enalapril (VASOTEC) 10 MG tablet TAKE 1 TABLET BY MOUTH EVERY DAY 90 tablet 1  . ezetimibe-simvastatin (VYTORIN) 10-40 MG per tablet TAKE 1 TABLET BY MOUTH AT BEDTIME 90 tablet 0  . fenofibrate 160 MG tablet TAKE 1 TABLET BY MOUTH EVERY DAY 90 tablet 0  . fish oil-omega-3 fatty acids 1000 MG capsule Take 2 g by mouth daily.    Marland Kitchen ketoconazole (NIZORAL) 2 % cream Apply 1 application topically 2 (two) times daily as needed for irritation. 30 g 0  . metoprolol (LOPRESSOR) 50 MG tablet TAKE 1 TABLET BY MOUTH TWICE A DAY 180 tablet 1  . zolpidem (AMBIEN) 10 MG tablet TAKE 1 TABLET BY MOUTH AT BEDTIME AS NEEDED FOR SLEEP 90 tablet 0   No current facility-administered medications for this visit.    Allergies:  Review of patient's allergies indicates no known allergies.   Social History: The patient  reports that he quit smoking about 17 years ago. His smoking use included Cigarettes. He started smoking about 44 years ago. He smoked 2.00 packs per day. His smokeless tobacco use includes Chew. He reports that he does not drink alcohol or use illicit drugs.   Family History: The patient's family history includes Hypertension in his mother.   ROS:  Please see the history of present illness. Otherwise, complete review of systems is positive for insomnia.  All other systems are reviewed and negative.    Physical Exam: VS:  BP 120/78 mmHg  Pulse 96  Ht $R'6\' 1"'ZM$  (1.854 m)  Wt 214 lb 6.4 oz (97.251 kg)  BMI 28.29 kg/m2, BMI Body mass index is 28.29 kg/(m^2).  Wt Readings from Last 3 Encounters:  02/01/15 214 lb 6.4 oz (97.251 kg)  10/30/14 210 lb (95.255 kg)  08/07/14 209 lb (94.802 kg)     Patient is in no acute distress. Did have nausea and emesis while in the  office, but felt much better after this. HEENT: Conjunctiva and lids normal, oropharynx clear. Neck: Supple, no elevated JVP or carotid bruits, no thyromegaly. Lungs: Decreased breath sounds throughout without wheezing, nonlabored breathing at rest. Cardiac: Irregular rate and rhythm, distant heart sounds, no S3 or significant systolic murmur, no pericardial rub. Abdomen: Soft, nontender, bowel sounds present, no guarding or rebound. Extremities: Trace ankle edema, distal pulses 2+. Skin: Warm and dry. Musculoskeletal: No kyphosis. Neuropsychiatric: Alert and oriented x3, affect grossly appropriate.   ECG: ECG is ordered today an reviewed finding rate-controlled atrial flutter at 83 bpm with nonspecific ST-T changes. No acute findings.   Recent Labwork: 04/06/2014: Creatinine 1.50* 08/25/2014: ALT 16; AST 22     Component Value Date/Time   CHOL 254* 08/25/2014 0922   TRIG 174* 08/25/2014 0922   HDL 45 08/25/2014  3329   CHOLHDL 5.6 08/25/2014 0922   VLDL 35 08/25/2014 0922   LDLCALC 174* 08/25/2014 0922    Other Studies Reviewed Today:  Lexiscan Cardiolite 02/21/2014: EXAM: MYOCARDIAL IMAGING WITH SPECT (REST AND PHARMACOLOGIC-STRESS)  GATED LEFT VENTRICULAR WALL MOTION STUDY  LEFT VENTRICULAR EJECTION FRACTION  TECHNIQUE: Standard myocardial SPECT imaging was performed after resting intravenous injection of 10 mCi Tc-42m sestamibi. Subsequently, intravenous infusion of Lexiscan was performed under the supervision of the Cardiology staff. At peak effect of the drug, 30 mCi Tc-97m sestamibi was injected intravenously and standard myocardial SPECT imaging was performed. Quantitative gated imaging was also performed to evaluate left ventricular wall motion, and estimate left ventricular ejection fraction.  COMPARISON: None.  FINDINGS: FINDINGS Baseline EKG showed atrial fibrillation with right bundle branch block. After injection heart rate increased some 79 beats  per min up to 112 beats per min and blood pressure decreased from 127/85 down to 118/79. The test was stopped after injection was complete, the patient did not experience any chest pain. Post-injection EKG showed no specific ischemic changes, the patient remained in atrial fibrillation with no new or arrhythmias.  Myocardial perfusion imaging  Raw images showed significant radiotracer uptake in the gut. There was a moderate sized mild intensity inferior and inferoapical wall defect seen in the pre-injection images. This same defect was less intense in the post-injection images. The inferior wall had normal wall motion. Overall findings are consistent with sub- diaphragmatic attenuation. There were no other myocardial perfusion defects.  End-diastolic volume 518 mL, end systolic volume 58 mL, left ventricular ejection fraction 51%. Septal motion was consistent with bundle branch block.  Post-injection EKG showed no specific ischemic changes and no significant arrhythmias.  IMPRESSION:  1. No reversible ischemia or infarction.  2. Normal left ventricular wall motion, pattern consistent with bundle branch block.  3. Left ventricular ejection fraction 51%  4. Low-risk stress test findings*.   ASSESSMENT AND PLAN:  1. Atypical chest pain as outlined above. Description is not typical for ischemic chest pain, may in fact be more related to psychosocial stressors as discussed. He has had some progression over the last 2 weeks, but most symptoms occur when he is at work and under emotional stress. ECG shows rate-controlled atrial flutter without acute ST segment abnormalities. He had a negative ischemic workup in August 2015. We reviewed his cardiac history and testing to date, discussed options for evaluation and management, and he elects observation only at this time. I have asked that we schedule an office visit within the next few weeks to see how he is doing, certainly if  symptoms escalate we can always consider pursuing follow-up cardiac evaluation, even cardiac catheterization, although again his symptoms are quite atypical. I have also asked him to follow-up with Dr. Wolfgang [name] and work on other stress management options, particularly as it relates to his work environment.  2. Persistent atrial flutter with controlled ventricular response. CHADSVASC score is 2, and so far we have continued aspirin without pursuing anticoagulation. He has preferred to hold off on anticoagulation or an attempt at cardioversion, and frankly it is not entirely clear how symptomatic at all he is from the atrial flutter which is of uncertain duration and may well be chronic.  3. Situational stress and anxiety.  4. COPD.  5. Essential hypertension, blood pressure is normal today.  6. Known multivessel CAD status post CABG in 2005 with negative Cardiolite from October 2015.   Current medicines are reviewed at length with the  patient today.  The patient does not have concerns regarding medicines.    Orders Placed This Encounter  Procedures  . EKG 12-Lead    Disposition: FU with me in 1-2 weeks.   Signed, Satira Sark, MD, [facility] 02/01/2015 3:40 PM    [place] [facility] [address] [place], [place] [address] Phone: [phone]; Fax: [phone]

## 2015-02-01 NOTE — Patient Instructions (Signed)
Your physician recommends that you schedule a follow-up appointment in: 1-2 weeks with Dr. Domenic Polite  Please be seen in the ED if you continue to have chest pain.  Your physician recommends that you continue on your current medications as directed. Please refer to the Current Medication list given to you today.  Thank you for choosing [facility]!

## 2015-02-01 NOTE — Progress Notes (Deleted)
Name: Dennis [name]    DOB: [birth year]  Age: 64 y.o.  MR#: [id number]       PCP:  Rubbie Battiest, MD      Insurance: Payor: [place] / Plan: [place] / Product Type: *No Product type* /   CC:    Chief Complaint  Patient presents with  . Chest Pain  . Coronary Artery Disease    CABG     VS Filed Vitals:   02/01/15 1401  BP: 120/78  Pulse: 96  Height: 6\' 1"  (1.854 m)  Weight: 214 lb 6.4 oz (97.251 kg)    Weights Current Weight  02/01/15 214 lb 6.4 oz (97.251 kg)  10/30/14 210 lb (95.255 kg)  08/07/14 209 lb (94.802 kg)    Blood Pressure  BP Readings from Last 3 Encounters:  02/01/15 120/78  09/12/14 142/98  08/07/14 122/88     Admit date:  (Not on file) Last encounter with RMR:  Visit date not found   Allergy Review of patient's allergies indicates no known allergies.  Current Outpatient Prescriptions  Medication Sig Dispense Refill  . albuterol (PROVENTIL HFA;VENTOLIN HFA) 108 (90 BASE) MCG/ACT inhaler Inhale 2 puffs into the lungs every 4 (four) hours as needed for wheezing. 3 Inhaler 1  . aspirin 81 MG tablet Take 81 mg by mouth every other day.     . chlorzoxazone (PARAFON) 500 MG tablet Take 1 tablet (500 mg total) by mouth 3 (three) times daily as needed for muscle spasms. 36 tablet 0  . enalapril (VASOTEC) 10 MG tablet TAKE 1 TABLET BY MOUTH EVERY DAY 90 tablet 1  . ezetimibe-simvastatin (VYTORIN) 10-40 MG per tablet TAKE 1 TABLET BY MOUTH AT BEDTIME 90 tablet 0  . fenofibrate 160 MG tablet TAKE 1 TABLET BY MOUTH EVERY DAY 90 tablet 0  . fish oil-omega-3 fatty acids 1000 MG capsule Take 2 g by mouth daily.    Marland Kitchen ketoconazole (NIZORAL) 2 % cream Apply 1 application topically 2 (two) times daily as needed for irritation. 30 g 0  . metoprolol (LOPRESSOR) 50 MG tablet TAKE 1 TABLET BY MOUTH TWICE A DAY 180 tablet 1  . zolpidem (AMBIEN) 10 MG tablet TAKE 1 TABLET BY MOUTH AT BEDTIME AS NEEDED FOR SLEEP 90 tablet 0   No current facility-administered  medications for this visit.    Discontinued Meds:    Medications Discontinued During This Encounter  Medication Reason  . enalapril (VASOTEC) 10 MG tablet Error  . fenofibrate 160 MG tablet Error  . ezetimibe-simvastatin (VYTORIN) 10-40 MG per tablet Error    Patient Active Problem List   Diagnosis Date Noted  . Chronic left hip pain 09/17/2014  . Atrial flutter 07/17/2014  . Hyperlipidemia LDL goal <100 04/08/2010  . Essential hypertension, benign 04/08/2010  . Coronary atherosclerosis of native coronary artery 04/08/2010  . COPD 04/08/2010    LABS    Component Value Date/Time   NA 137 12/29/2013 0728   K 5.3 12/29/2013 0728   CL 101 12/29/2013 0728   CO2 29 12/29/2013 0728   GLUCOSE 108* 08/25/2014 0922   GLUCOSE 101* 12/29/2013 0728   BUN 20 12/29/2013 0728   CREATININE 1.50* 04/06/2014 1023   CREATININE 1.12 12/29/2013 0728   CALCIUM 9.6 12/29/2013 0728   CMP     Component Value Date/Time   NA 137 12/29/2013 0728   K 5.3 12/29/2013 0728   CL 101 12/29/2013 0728   CO2 29 12/29/2013 0728   GLUCOSE 108*  08/25/2014 0922   BUN 20 12/29/2013 0728   CREATININE 1.50* 04/06/2014 1023   CREATININE 1.12 12/29/2013 0728   CALCIUM 9.6 12/29/2013 0728   PROT 6.4 08/25/2014 0922   ALBUMIN 4.2 08/25/2014 0922   AST 22 08/25/2014 0922   ALT 16 08/25/2014 0922   ALKPHOS 27* 08/25/2014 0922   BILITOT 0.5 08/25/2014 0922    No results found for: WBC, HGB, HCT, MCV  Lipid Panel     Component Value Date/Time   CHOL 254* 08/25/2014 0922   TRIG 174* 08/25/2014 0922   HDL 45 08/25/2014 0922   CHOLHDL 5.6 08/25/2014 0922   VLDL 35 08/25/2014 0922   LDLCALC 174* 08/25/2014 0922    ABG No results found for: PHART, PCO2ART, PO2ART, HCO3, TCO2, ACIDBASEDEF, O2SAT   No results found for: TSH BNP (last 3 results) No results for input(s): BNP in the last 8760 hours.  ProBNP (last 3 results) No results for input(s): PROBNP in the last 8760 hours.  Cardiac Panel (last 3  results) No results for input(s): CKTOTAL, CKMB, TROPONINI, RELINDX in the last 72 hours.  Iron/TIBC/Ferritin/ %Sat No results found for: IRON, TIBC, FERRITIN, IRONPCTSAT   EKG Orders placed or performed during the hospital encounter of 07/24/14  . Holter monitor - 24 hour  . Holter monitor - 24 hour     Prior Assessment and Plan Problem List as of 02/01/2015      Cardiovascular and Mediastinum   Essential hypertension, benign   Last Assessment & Plan 08/07/2014 Office Visit Written 08/07/2014  9:41 AM by Satira Sark, MD    Blood pressure is well controlled today.      Coronary atherosclerosis of native coronary artery   Last Assessment & Plan 08/07/2014 Office Visit Written 08/07/2014  9:39 AM by Satira Sark, MD    Recent Cardiolite study shows no active ischemia with LVEF 51%. Plan will be to continue medical therapy and observation. He is on aspirin and statin therapy.      Atrial flutter   Last Assessment & Plan 08/07/2014 Office Visit Written 08/07/2014  9:40 AM by Satira Sark, MD    Persistent, reasonably well rate controlled, duration uncertain. Not entirely clear how symptom provoking this is. CHADSVASC score is 2. For now he is comfortable staying on aspirin and beta blocker, would like to hold off on considering cardioversion or anticoagulant. We will review this with him over time. Followup in 3 months.        Respiratory   COPD   Last Assessment & Plan 07/17/2014 Office Visit Edited 07/17/2014 10:56 AM by Satira Sark, MD    The patient states that this is "severe" and although I am not certain about objective testing. He does not follow a pulmonologist. This could also be contributing to atrial arrhythmia.        Other   Hyperlipidemia LDL goal <100   Last Assessment & Plan 08/07/2014 Office Visit Written 08/07/2014  9:41 AM by Satira Sark, MD    Continues on Vytorin and omega-3 supplements. Keep follow with Dr. [name].      Chronic left hip  pain       Imaging: No results found.

## 2015-02-01 NOTE — Progress Notes (Deleted)
Cardiology Office Note   Date:  02/01/2015   ID:  Dennis [name], DOB 09-01-1951, MRN [id number]  PCP:  Rubbie Battiest, MD  Cardiologist:  [place]/ Jory Sims, NP   Chief Complaint  Patient presents with  . Chest Pain  . Coronary Artery Disease    CABG       History of Present Illness: Dennis [name] is a 64 y.o. male who presents for ongoing assessment and management of CAD, with history of coronary artery bypass grafting, atrial flutter CHADS VASC 2.  Most recent stress test completed in August of 2015 demonstrating no perfusion abnormalities or evidence of ischemia.  Most recent echo revealed mild to moderate LVH with LVEF 50%-55%.  He was last seen by Dr. Domenic Polite in September 2015.  The patient called on 01/31/2015 with complaints of muscle cramping in his chest when he leans forward, he also experienced similar symptoms when lying in bed.  They resolved on their own.  He was advised to see me today to discuss his symptoms   Past Medical History  Diagnosis Date  . Essential hypertension, benign   . Hyperlipidemia   . COPD (chronic obstructive pulmonary disease)   . Coronary atherosclerosis of native coronary artery     Multivessel status post CABG  . Insomnia   . Atrial flutter     Diagnosed by ECG August 2015    Past Surgical History  Procedure Laterality Date  . Coronary artery bypass graft  2005    Dr. Lucianne Lei Trigt: LIMA to LAD, right radial to circumflex, SVG to RCA     Current Outpatient Prescriptions  Medication Sig Dispense Refill  . albuterol (PROVENTIL HFA;VENTOLIN HFA) 108 (90 BASE) MCG/ACT inhaler Inhale 2 puffs into the lungs every 4 (four) hours as needed for wheezing. 3 Inhaler 1  . aspirin 81 MG tablet Take 81 mg by mouth daily.    . chlorzoxazone (PARAFON) 500 MG tablet Take 1 tablet (500 mg total) by mouth 3 (three) times daily as needed for muscle spasms. 36 tablet 0  . enalapril (VASOTEC) 10 MG tablet TAKE 1 TABLET BY MOUTH EVERY DAY 90 tablet 1   . enalapril (VASOTEC) 10 MG tablet TAKE 1 TABLET BY MOUTH EVERY DAY 90 tablet 0  . ezetimibe-simvastatin (VYTORIN) 10-40 MG per tablet TAKE 1 TABLET BY MOUTH AT BEDTIME 90 tablet 1  . ezetimibe-simvastatin (VYTORIN) 10-40 MG per tablet TAKE 1 TABLET BY MOUTH AT BEDTIME 90 tablet 0  . fenofibrate 160 MG tablet TAKE 1 TABLET BY MOUTH EVERY DAY 90 tablet 0  . fenofibrate 160 MG tablet TAKE 1 TABLET BY MOUTH EVERY DAY 90 tablet 1  . fish oil-omega-3 fatty acids 1000 MG capsule Take 2 g by mouth daily.    Marland Kitchen ketoconazole (NIZORAL) 2 % cream Apply 1 application topically 2 (two) times daily as needed for irritation. 30 g 0  . metoprolol (LOPRESSOR) 50 MG tablet TAKE 1 TABLET BY MOUTH TWICE A DAY 180 tablet 1  . zolpidem (AMBIEN) 10 MG tablet TAKE 1 TABLET BY MOUTH AT BEDTIME AS NEEDED FOR SLEEP 90 tablet 0   No current facility-administered medications for this visit.    Allergies:   Review of patient's allergies indicates no known allergies.    Social History:  The patient  reports that he has quit smoking. His smoking use included Cigarettes. He quit smokeless tobacco use about 17 years ago. He reports that he does not drink alcohol or use illicit drugs.   Family  History:  The patient's family history includes Hypertension in his mother.    ROS: .   All other systems are reviewed and negative.Unless otherwise mentioned in  H&P above.   PHYSICAL EXAM: VS:  There were no vitals taken for this visit. , BMI There is no weight on file to calculate BMI. GEN: Well nourished, well developed, in no acute distress HEENT: normal Neck: no JVD, carotid bruits, or masses Cardiac: ***RRR; no murmurs, rubs, or gallops,no edema  Respiratory:  clear to auscultation bilaterally, normal work of breathing GI: soft, nontender, nondistended, + BS MS: no deformity or atrophy Skin: warm and dry, no rash Neuro:  Strength and sensation are intact Psych: euthymic mood, full affect   EKG:  EKG {ACTION; IS/IS  [id number]} ordered today. The ekg ordered today demonstrates ***   Recent Labs: 04/06/2014: Creatinine 1.50* 08/25/2014: ALT 16    Lipid Panel    Component Value Date/Time   CHOL 254* 08/25/2014 0922   TRIG 174* 08/25/2014 0922   HDL 45 08/25/2014 0922   CHOLHDL 5.6 08/25/2014 0922   VLDL 35 08/25/2014 0922   LDLCALC 174* 08/25/2014 0922      Wt Readings from Last 3 Encounters:  10/30/14 210 lb (95.255 kg)  08/07/14 209 lb (94.802 kg)  07/17/14 211 lb (95.709 kg)      Other studies Reviewed: Additional studies/ records that were reviewed today include: ***. Review of the above records demonstrates: ***   ASSESSMENT AND PLAN:  1.  ***   Current medicines are reviewed at length with the patient today.    Labs/ tests ordered today include: *** No orders of the defined types were placed in this encounter.     Disposition:   FU with *** in {gen number [id number]} {TIME; UNITS DAY/WEEK/MONTH:19136}   Signed, Jory Sims, NP  02/01/2015 7:16 AM    [facility] [address] [place], [place] [address] Phone: [phone]; Fax: [phone]

## 2015-02-07 ENCOUNTER — Telehealth: Payer: Self-pay | Admitting: Family Medicine

## 2015-02-07 DIAGNOSIS — E785 Hyperlipidemia, unspecified: Secondary | ICD-10-CM

## 2015-02-07 DIAGNOSIS — Z79899 Other long term (current) drug therapy: Secondary | ICD-10-CM

## 2015-02-07 DIAGNOSIS — Z125 Encounter for screening for malignant neoplasm of prostate: Secondary | ICD-10-CM

## 2015-02-07 NOTE — Telephone Encounter (Signed)
Pt is requesting lab orders to be sent in for his upcoming appt on 3/28 Last labs were POCT,LIPID,HEPATIC,GLUCOSE 08/25/14 Pt wants to have these done this sat 02/10/15

## 2015-02-08 NOTE — Telephone Encounter (Signed)
Lip liv m7 psa 

## 2015-02-08 NOTE — Telephone Encounter (Signed)
Discussed with pt

## 2015-02-08 NOTE — Telephone Encounter (Signed)
BW orders are in [facility] to let pt know to go to [facility]

## 2015-02-10 LAB — BASIC METABOLIC PANEL
BUN / CREAT RATIO: 15 (ref 10–22)
BUN: 17 mg/dL (ref 8–27)
CHLORIDE: 99 mmol/L (ref 97–108)
CO2: 25 mmol/L (ref 18–29)
Calcium: 9.7 mg/dL (ref 8.6–10.2)
Creatinine, Ser: 1.16 mg/dL (ref 0.76–1.27)
GFR calc non Af Amer: 67 mL/min/{1.73_m2} (ref >59)
GFR, EST AFRICAN AMERICAN: 78 mL/min/{1.73_m2} (ref >59)
Glucose: 105 mg/dL — ABNORMAL HIGH (ref 65–99)
Potassium: 5.1 mmol/L (ref 3.5–5.2)
Sodium: 140 mmol/L (ref 134–144)

## 2015-02-10 LAB — LIPID PANEL
CHOLESTEROL TOTAL: 235 mg/dL — AB (ref 100–199)
Chol/HDL Ratio: 6 ratio units — ABNORMAL HIGH (ref 0.0–5.0)
HDL: 39 mg/dL — AB (ref >39)
LDL Calculated: 166 mg/dL — ABNORMAL HIGH (ref 0–99)
Triglycerides: 149 mg/dL (ref 0–149)
VLDL CHOLESTEROL CAL: 30 mg/dL (ref 5–40)

## 2015-02-10 LAB — HEPATIC FUNCTION PANEL
ALT: 21 IU/L (ref 0–44)
AST: 23 IU/L (ref 0–40)
Albumin: 4.4 g/dL (ref 3.6–4.8)
Alkaline Phosphatase: 27 IU/L — ABNORMAL LOW (ref 39–117)
BILIRUBIN TOTAL: 0.6 mg/dL (ref 0.0–1.2)
Bilirubin, Direct: 0.2 mg/dL (ref 0.00–0.40)
Total Protein: 6.7 g/dL (ref 6.0–8.5)

## 2015-02-10 LAB — PSA: PSA: 0.8 ng/mL (ref 0.0–4.0)

## 2015-02-13 ENCOUNTER — Other Ambulatory Visit: Payer: Self-pay | Admitting: Family Medicine

## 2015-02-13 NOTE — Telephone Encounter (Signed)
Ok plus 5 monthly ref 

## 2015-02-15 ENCOUNTER — Ambulatory Visit: Payer: BLUE CROSS/BLUE SHIELD | Admitting: Adult Health

## 2015-02-16 ENCOUNTER — Ambulatory Visit: Payer: BLUE CROSS/BLUE SHIELD | Admitting: Adult Health

## 2015-02-19 ENCOUNTER — Ambulatory Visit: Payer: Self-pay | Admitting: Family Medicine

## 2015-02-26 ENCOUNTER — Encounter: Payer: Self-pay | Admitting: Family Medicine

## 2015-02-26 ENCOUNTER — Ambulatory Visit (INDEPENDENT_AMBULATORY_CARE_PROVIDER_SITE_OTHER): Payer: BLUE CROSS/BLUE SHIELD | Admitting: Family Medicine

## 2015-02-26 VITALS — BP 122/86 | Ht 73.0 in | Wt 214.0 lb

## 2015-02-26 DIAGNOSIS — R739 Hyperglycemia, unspecified: Secondary | ICD-10-CM | POA: Diagnosis not present

## 2015-02-26 DIAGNOSIS — R7301 Impaired fasting glucose: Secondary | ICD-10-CM | POA: Diagnosis not present

## 2015-02-26 DIAGNOSIS — E785 Hyperlipidemia, unspecified: Secondary | ICD-10-CM | POA: Diagnosis not present

## 2015-02-26 DIAGNOSIS — I10 Essential (primary) hypertension: Secondary | ICD-10-CM | POA: Diagnosis not present

## 2015-02-26 NOTE — Progress Notes (Signed)
   Subjective:    Patient ID: Dennis [name], male    DOB: 1951-06-21, 64 y.o.   MRN: [id number]  Hyperlipidemia This is a chronic problem. The current episode started more than 1 year ago.  does a lot of walking a work. Works 12 hours a day. Compliant with lipid medicine. No obvious side effects. But wonders about how sugar may be related.   Pt does not eat a lot of sweets. Drinks 4 oz of red wine a day.   Patient notes compliance with blood pressure medicine. No obvious side effects. Has cut salt down. Not exercising a lot.   Hyperglycemia. bs on bloodwork was 105. Last a1c in octo was 6.3.  a1c today. 6.0. Trying to watch her sugar in his diet.   Pt concerned that statin drugs are  Causing elevated blood sugar.  Results for orders placed or performed in visit on 02/07/15  Lipid panel  Result Value Ref Range   Cholesterol, Total 235 (H) 100 - 199 mg/dL   Triglycerides 149 0 - 149 mg/dL   HDL 39 (L) >39 mg/dL   VLDL Cholesterol Cal 30 5 - 40 mg/dL   LDL Calculated 166 (H) 0 - 99 mg/dL   Chol/HDL Ratio 6.0 (H) 0.0 - 5.0 ratio units  Hepatic function panel  Result Value Ref Range   Total Protein 6.7 6.0 - 8.5 g/dL   Albumin 4.4 3.6 - 4.8 g/dL   Bilirubin Total 0.6 0.0 - 1.2 mg/dL   Bilirubin, Direct 0.20 0.00 - 0.40 mg/dL   Alkaline Phosphatase 27 (L) 39 - 117 IU/L   AST 23 0 - 40 IU/L   ALT 21 0 - 44 IU/L  Basic metabolic panel  Result Value Ref Range   Glucose 105 (H) 65 - 99 mg/dL   BUN 17 8 - 27 mg/dL   Creatinine, Ser 1.16 0.76 - 1.27 mg/dL   GFR calc non Af Amer 67 >59 mL/min/1.73   GFR calc Af Amer 78 >59 mL/min/1.73   BUN/Creatinine Ratio 15 10 - 22   Sodium 140 134 - 144 mmol/L   Potassium 5.1 3.5 - 5.2 mmol/L   Chloride 99 97 - 108 mmol/L   CO2 25 18 - 29 mmol/L   Calcium 9.7 8.6 - 10.2 mg/dL  PSA  Result Value Ref Range   PSA 0.8 0.0 - 4.0 ng/mL     Review of Systems    no current chest pain. No headache no abdominal pain no change in bowel habits  no blood in stool Objective:   Physical Exam   Alert no apparent distress HEENT normal neck supple lungs diminished breath sounds diffusely heart regular in rhythm. Ankles without edema     Assessment & Plan:  Impression 1 hypertension good control discussed #2 hyperlipidemia controlled good for this patient discuss history of extremely high LDL and past. #3 impaired fasting glucose long-standing discussion held. Slight increased risk with statins but statins are helping keep the patient alive plan diet exercise discussed maintain all medications recheck in 6 months WSL

## 2015-02-26 NOTE — Patient Instructions (Signed)
Results for orders placed or performed in visit on 02/07/15  Lipid panel  Result Value Ref Range   Cholesterol, Total 235 (H) 100 - 199 mg/dL   Triglycerides 149 0 - 149 mg/dL   HDL 39 (L) >39 mg/dL   VLDL Cholesterol Cal 30 5 - 40 mg/dL   LDL Calculated 166 (H) 0 - 99 mg/dL   Chol/HDL Ratio 6.0 (H) 0.0 - 5.0 ratio units  Hepatic function panel  Result Value Ref Range   Total Protein 6.7 6.0 - 8.5 g/dL   Albumin 4.4 3.6 - 4.8 g/dL   Bilirubin Total 0.6 0.0 - 1.2 mg/dL   Bilirubin, Direct 0.20 0.00 - 0.40 mg/dL   Alkaline Phosphatase 27 (L) 39 - 117 IU/L   AST 23 0 - 40 IU/L   ALT 21 0 - 44 IU/L  Basic metabolic panel  Result Value Ref Range   Glucose 105 (H) 65 - 99 mg/dL   BUN 17 8 - 27 mg/dL   Creatinine, Ser 1.16 0.76 - 1.27 mg/dL   GFR calc non Af Amer 67 >59 mL/min/1.73   GFR calc Af Amer 78 >59 mL/min/1.73   BUN/Creatinine Ratio 15 10 - 22   Sodium 140 134 - 144 mmol/L   Potassium 5.1 3.5 - 5.2 mmol/L   Chloride 99 97 - 108 mmol/L   CO2 25 18 - 29 mmol/L   Calcium 9.7 8.6 - 10.2 mg/dL  PSA  Result Value Ref Range   PSA 0.8 0.0 - 4.0 ng/mL

## 2015-04-16 ENCOUNTER — Other Ambulatory Visit: Payer: Self-pay | Admitting: Family Medicine

## 2015-04-21 ENCOUNTER — Other Ambulatory Visit: Payer: Self-pay | Admitting: Family Medicine

## 2015-07-02 ENCOUNTER — Telehealth: Payer: Self-pay | Admitting: Internal Medicine

## 2015-07-02 NOTE — Telephone Encounter (Signed)
Patient's wife called to see if she could get her husband's colonoscopy scheduled. She said its been at least 7 years since his last one. She said he did have a history of polyps but nothing out of the ordinary with him. She also has questions about insurance.  I told her that I would like for her to speak to the triage nurse first to see if he actually needs an appointment or is he can be triaged. She agreed because she wants to speak with nurse about insurance questions anyway. Please advise and call [phone] Dr [name] is his PCP

## 2015-07-04 NOTE — Telephone Encounter (Signed)
Per our record's pt's last colonoscopy was 02/07/2008.  He was on our recall for 2014 and a letter was sent to remind him. Previous hx of tubulovillous adenoma. OV with Walden Field, NP on 08/06/2015 at 9:00 AM. He has [employer].

## 2015-07-27 ENCOUNTER — Telehealth: Payer: Self-pay | Admitting: Family Medicine

## 2015-07-27 DIAGNOSIS — J449 Chronic obstructive pulmonary disease, unspecified: Secondary | ICD-10-CM

## 2015-07-27 DIAGNOSIS — J441 Chronic obstructive pulmonary disease with (acute) exacerbation: Secondary | ICD-10-CM

## 2015-07-27 NOTE — Telephone Encounter (Signed)
Pt would like to be referred to Dr. Luan Pulling for his COPD.  If it's ok to refer, please initiate in system so that I may process or if NTBS please call notify pt

## 2015-07-27 NOTE — Telephone Encounter (Signed)
Referral ordered in EPIC. Patient notified. 

## 2015-07-27 NOTE — Telephone Encounter (Signed)
Dennis [name]

## 2015-08-06 ENCOUNTER — Ambulatory Visit: Payer: BLUE CROSS/BLUE SHIELD | Admitting: Nurse Practitioner

## 2015-08-07 ENCOUNTER — Other Ambulatory Visit: Payer: Self-pay | Admitting: Family Medicine

## 2015-08-09 ENCOUNTER — Other Ambulatory Visit: Payer: Self-pay | Admitting: Family Medicine

## 2015-08-10 NOTE — Telephone Encounter (Signed)
Ok six ref 

## 2015-08-13 ENCOUNTER — Ambulatory Visit (INDEPENDENT_AMBULATORY_CARE_PROVIDER_SITE_OTHER): Payer: BLUE CROSS/BLUE SHIELD | Admitting: Internal Medicine

## 2015-08-13 ENCOUNTER — Other Ambulatory Visit (INDEPENDENT_AMBULATORY_CARE_PROVIDER_SITE_OTHER): Payer: BLUE CROSS/BLUE SHIELD

## 2015-08-13 ENCOUNTER — Encounter: Payer: Self-pay | Admitting: Internal Medicine

## 2015-08-13 ENCOUNTER — Ambulatory Visit (INDEPENDENT_AMBULATORY_CARE_PROVIDER_SITE_OTHER)
Admission: RE | Admit: 2015-08-13 | Discharge: 2015-08-13 | Disposition: A | Discharge status: Home / Self Care | Payer: BLUE CROSS/BLUE SHIELD | Source: Ambulatory Visit | Attending: Internal Medicine | Admitting: Internal Medicine

## 2015-08-13 VITALS — BP 116/72 | HR 77 | Ht 73.0 in | Wt 212.4 lb

## 2015-08-13 DIAGNOSIS — R05 Cough: Secondary | ICD-10-CM

## 2015-08-13 DIAGNOSIS — I10 Essential (primary) hypertension: Secondary | ICD-10-CM

## 2015-08-13 DIAGNOSIS — R0600 Dyspnea, unspecified: Secondary | ICD-10-CM | POA: Diagnosis not present

## 2015-08-13 DIAGNOSIS — R058 Other specified cough: Secondary | ICD-10-CM

## 2015-08-13 LAB — BASIC METABOLIC PANEL
BUN: 17 mg/dL (ref 6–23)
CO2: 30 mEq/L (ref 19–32)
Calcium: 9.5 mg/dL (ref 8.4–10.5)
Chloride: 102 mEq/L (ref 96–112)
Creatinine, Ser: 1.28 mg/dL (ref 0.40–1.50)
GFR: 60.41 mL/min (ref >60.00)
Glucose, Bld: 98 mg/dL (ref 70–99)
Potassium: 4.7 mEq/L (ref 3.5–5.1)
SODIUM: 138 meq/L (ref 135–145)

## 2015-08-13 LAB — CBC WITH DIFFERENTIAL/PLATELET
Basophils Absolute: 0.1 10*3/uL (ref 0.0–0.1)
Basophils Relative: 0.8 % (ref 0.0–3.0)
EOS ABS: 0.1 10*3/uL (ref 0.0–0.7)
Eosinophils Relative: 1.5 % (ref 0.0–5.0)
HCT: 43.8 % (ref 39.0–52.0)
HEMOGLOBIN: 14.8 g/dL (ref 13.0–17.0)
Lymphocytes Relative: 28.1 % (ref 12.0–46.0)
Lymphs Abs: 2.2 10*3/uL (ref 0.7–4.0)
MCHC: 33.8 g/dL (ref 30.0–36.0)
MCV: 90.3 fl (ref 78.0–100.0)
MONO ABS: 0.7 10*3/uL (ref 0.1–1.0)
Monocytes Relative: 8.8 % (ref 3.0–12.0)
Neutro Abs: 4.7 10*3/uL (ref 1.4–7.7)
Neutrophils Relative %: 60.8 % (ref 43.0–77.0)
Platelets: 160 10*3/uL (ref 150.0–400.0)
RBC: 4.85 Mil/uL (ref 4.22–5.81)
RDW: 12.7 % (ref 11.5–15.5)
WBC: 7.7 10*3/uL (ref 4.0–10.5)

## 2015-08-13 LAB — TSH: TSH: 1.83 u[IU]/mL (ref 0.35–4.50)

## 2015-08-13 LAB — BRAIN NATRIURETIC PEPTIDE: PRO B NATRI PEPTIDE: 126 pg/mL — AB (ref 0.0–100.0)

## 2015-08-13 MED ORDER — VALSARTAN 160 MG PO TABS: 160.0000 mg | ORAL_TABLET | Freq: Every day | ORAL | Status: DC

## 2015-08-13 NOTE — Patient Instructions (Addendum)
Stop vasotec  And start avapro 160 mg daily   Pepcid ac 20 mg one at bedtime   GERD (REFLUX)  is an extremely common cause of respiratory symptoms just like yours , many times with no obvious heartburn at all.    It can be treated with medication, but also with lifestyle changes including elevation of the head of your bed (ideally with 6 inch  bed blocks),  Smoking cessation, avoidance of   Meals before bed , excessive alcohol, and avoid fatty foods, chocolate, peppermint, colas, red wine, and acidic juices such as orange juice.  NO MINT OR MENTHOL PRODUCTS SO NO COUGH DROPS  USE SUGARLESS CANDY INSTEAD (Jolley ranchers or Stover's or Life Savers) or even ice chips will also do - the key is to swallow to prevent all throat clearing. NO OIL BASED VITAMINS - use powdered substitutes.    Please remember to go to the lab and x-ray department downstairs for your tests - we will call you with the results when they are available.  Strongly recommend you discuss the statin issue directly with Dr [name].     Please schedule a follow up office visit in 6 weeks, call sooner if needed with pfts

## 2015-08-13 NOTE — Progress Notes (Signed)
Subjective:    Patient ID: Dennis [name], male    DOB: August 23, 1951,  MRN: [id number]  HPI  56 yowm quit smoking around 1999 cough> sob both better then sev years later worse breathing/fatigue > 20005 much better and able to gym p rehab including aerobics and miniimal respiratory symptoms / rare need for saba at wt around 210 and gradually downhill since then to the point to where out of breath house to mailbox slt incline x 100 ft assoc with audible wheeze   08/13/2015 1st [facility] office visit/ Wert   Chief Complaint  Patient presents with  . Pulmonary Consult    Referred by Dr. [name] for eval of COPD. He states dxed with COPD in 2006. He c/o DOE with minimal exertion such as walking to the mailbox. 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 He has occ cough- prod with clear sputum.  He is using albuterol 1-3 x per day on average.   cough/ sob worse at work, better at home unless go out in heat  - wife hears noisy breathing and can't get to mb and back s stopping to rest. Prev eval by [facility] for same and told he had mild copd and downhill since then despite no smoking in interim  No obvious other patterns in day to day or daytime variabilty or assoc   cp or chest tightness, subjective wheeze overt sinus or hb symptoms. No unusual exp hx or h/o childhood pna/ asthma or knowledge of premature birth.  Sleeping ok without nocturnal  or early am exacerbation  of respiratory  c/o's or need for noct saba. Also denies any obvious fluctuation of symptoms with weather or environmental changes or other aggravating or alleviating factors except as outlined above   Current Medications, Allergies, Complete Past Medical History, Past Surgical History, Family History, and Social History were reviewed in [employer] record.               Review of Systems  Constitutional: Negative for fever, chills, activity change, appetite change and  unexpected weight change.  HENT: Negative for congestion, dental problem, postnasal drip, rhinorrhea, sneezing, sore throat, trouble swallowing and voice change.   Eyes: Negative for visual disturbance.  Respiratory: Positive for cough and shortness of breath. Negative for choking.   Cardiovascular: Negative for chest pain and leg swelling.  Gastrointestinal: Negative for nausea, vomiting and abdominal pain.  Genitourinary: Negative for difficulty urinating.  Musculoskeletal: Negative for arthralgias.  Skin: Negative for rash.  Psychiatric/Behavioral: Negative for behavioral problems and confusion.       Objective:   Physical Exam  amb slt obese wm nad but no eye contact/ keeps eyes closed during most of interview  Wt Readings from Last 3 Encounters:  08/13/15 212 lb 6.4 oz (96.344 kg)  02/26/15 214 lb (97.07 kg)  02/01/15 214 lb 6.4 oz (97.251 kg)    Vital signs reviewed   HEENT: nl dentition, turbinates, and orophanx. Nl external ear canals without cough reflex   NECK :  without JVD/Nodes/TM/ nl carotid upstrokes bilaterally   LUNGS: no acc muscle use, clear to A and P bilaterally without cough on insp or exp maneuvers   CV:  RRR  no s3 or murmur or increase in P2, no edema   ABD:  soft and nontender with nl excursion in the supine position. No bruits or organomegaly, bowel sounds nl  MS:  warm without deformities, calf tenderness, cyanosis or  clubbing  SKIN: warm and dry without lesions    NEURO:  alert, approp, no deficits     CXR PA and Lateral:   08/13/2015 :     I personally reviewed images and agree with radiology impression as follows:   1. Stable chest from 10/25/2014. No acute cardiopulmonary disease. 2. Pleural-parenchymal scarring. COPD cannot be excluded. COPD 3. Prior CABG. Heart size stable.   Labs ordered/ reviewed:  Lab 08/13/15 1046  NA 138  K 4.7  CL 102  CO2 30  BUN 17  CREATININE 1.28  GLUCOSE 98     Lab 08/13/15 1046  HGB  14.8  HCT 43.8  WBC 7.7  PLT 160.0     Lab Results  Component Value Date   TSH 1.83 08/13/2015     Lab Results  Component Value Date   PROBNP 126.0* 08/13/2015          Assessment & Plan:

## 2015-08-13 NOTE — Assessment & Plan Note (Signed)
ACE inhibitors are problematic in  pts with airway complaints because  even experienced pulmonologists can't always distinguish ace effects from copd/asthma/pnds/ allergies etc.  By themselves they don't actually cause a problem, much like oxygen can't by itself start a fire, but they certainly serve as a powerful catalyst or enhancer for any "fire"  or inflammatory process in the upper airway, be it caused by an ET  tube or more commonly reflux (especially in the obese or pts with known GERD or who are on biphoshonates) or URI's, due to interference with bradykinin clearance.  The effects of acei on bradykinin levels occurs in 100% of pt's on acei (unless they surreptitiously stop the med!) but the classic cough is only reported in 5%.  This leaves 95% of pts on acei's  with a variety of syndromes including no identifiable symptom in most  vs non-specific symptoms that wax and wane depending on what other insult is occuring at the level of the upper airway, perhaps related to gerd or exp at work to upper airway irritants  Try off vasotec and on diovan 160 mg daily instead, it's the only way to sort this out.

## 2015-08-13 NOTE — Assessment & Plan Note (Signed)
Voice fatigue and loud wheezing which his wife can hear but I can't most likely is an example of  Upper airway cough syndrome, so named because it's frequently impossible to sort out how much is  CR/sinusitis with freq throat clearing (which can be related to primary GERD)   vs  causing  secondary (" extra esophageal")  GERD from wide swings in gastric pressure that occur with throat clearing, often  promoting self use of mint and menthol lozenges that reduce the lower esophageal sphincter tone and exacerbate the problem further in a cyclical fashion.   These are the same pts (now being labeled as having "irritable larynx syndrome" by some cough centers) who not infrequently have a history of having failed to tolerate ace inhibitors,  dry powder inhalers or biphosphonates or report having atypical reflux symptoms that don't respond to standard doses of PPI , and are easily confused as having aecopd or asthma flares by even experienced allergists/ pulmonologists.   For now try off acei and just add diet plus HW h2 and regroup in 6 weeks

## 2015-08-13 NOTE — Assessment & Plan Note (Addendum)
-   08/13/2015  Walked RA x 3 laps @ 185 ft each stopped due to  End of study, nl pace, no sob or desat   When respiratory symptoms begin or become refractory well after a patient reports complete smoking cessation,  Especially when this wasn't the case while they were smoking, a red flag is raised based on the work of Dr Kris Mouton which states:  if you quit smoking when your best day FEV1 is still well preserved it is highly unlikely you will progress to severe disease.  That is to say, once the smoking stops,  the symptoms should not suddenly erupt or markedly worsen.  If so, the differential diagnosis should include  obesity/deconditioning,  LPR/Reflux/Aspiration syndromes,  occult CHF, or  especially side effect of medications commonly used in this population, esp acei inhibitors (since his is vasotec, one of the first, I suspect he's been on this one for many years including while his previous pulmonary eval was done)  rec  Trial off x 6 weeks then return for pfts and then discuss the issue of work related sob/ cough or disability issues. In meantime should limit exp to caustic fumes per OSHA standards but nothing more elaborate than that.  Total time = 53m review case with pt/wife  discussion/ counseling/ giving and going over instructions (see avs)

## 2015-08-13 NOTE — Progress Notes (Signed)
Quick Note:  Spoke with pt and notified of results per Dr. Wert. Pt verbalized understanding and denied any questions.  ______ 

## 2015-08-13 NOTE — Progress Notes (Signed)
Quick Note:  ATC, Line busy, WCB ______ 

## 2015-08-14 ENCOUNTER — Encounter: Payer: Self-pay | Admitting: Nurse Practitioner

## 2015-08-14 ENCOUNTER — Other Ambulatory Visit: Payer: Self-pay

## 2015-08-14 ENCOUNTER — Ambulatory Visit (INDEPENDENT_AMBULATORY_CARE_PROVIDER_SITE_OTHER): Payer: BLUE CROSS/BLUE SHIELD | Admitting: Nurse Practitioner

## 2015-08-14 VITALS — BP 163/108 | HR 82 | Temp 98.8°F | Ht 73.0 in | Wt 210.8 lb

## 2015-08-14 DIAGNOSIS — Z86010 Personal history of colonic polyps: Secondary | ICD-10-CM

## 2015-08-14 DIAGNOSIS — Z860101 Personal history of adenomatous and serrated colon polyps: Secondary | ICD-10-CM | POA: Insufficient documentation

## 2015-08-14 DIAGNOSIS — K625 Hemorrhage of anus and rectum: Secondary | ICD-10-CM | POA: Diagnosis not present

## 2015-08-14 MED ORDER — PEG 3350-KCL-NA BICARB-NACL 420 G PO SOLR: 4000.0000 mL | Freq: Once | ORAL | Status: DC

## 2015-08-14 NOTE — Patient Instructions (Signed)
1. We will schedule your procedure for you. 2. Further recommendations to be based on results of your procedure. 

## 2015-08-14 NOTE — Progress Notes (Signed)
Quick Note:  LMTCB ______ 

## 2015-08-14 NOTE — Assessment & Plan Note (Signed)
Patient with occasional rectal bleeding which he describes as sometimes being moderate. No bleeding in 5 weeks. States that some point he was told he has mild colitis although this has not been found on colonoscopies done by our service nor could any records to be found to document this. He did have hemorrhoids on his last colonoscopy. Possibly benign anorectal bleeding. However, he is due for a repeat surveillance colonoscopy as noted below.

## 2015-08-14 NOTE — Progress Notes (Signed)
CC'ED TO PCP 

## 2015-08-14 NOTE — Progress Notes (Signed)
Primary Care Physician:  Mickie Hillier, MD Primary Gastroenterologist:  Dr. Gala Romney  Chief Complaint  Patient presents with  . Colonoscopy    HPI:   64 year old male presents for follow-up and rescheduling a surveillance colonoscopy. 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 His colonoscopy found minimal internal hemorrhoids, otherwise normal rectum, scattered left-sided diverticula, diminutive polyp which was removed and found to be benign polypoid colonic mucosa on pathology. Recommended 5 year repeat colonoscopy. At this point he is overdue.  Today he states he was having episode of abdominal pain about 8 months ago which self-resolved. Denies N/V. Admits occasional rectal bleeding, but was told at some point he has colitis, although no record of this could be found. Did have hemorrhoids on last colonoscopy.No rectal bleeding in the past 5 weeks. Denies fever, chills, unintnentional weight loss. Drinks daily and takes Ambien for sleep. Denies chest pain, dyspnea, dizziness, lightheadedness, syncope, near syncope. Denies any other upper or lower GI symptoms.  Past Medical History  Diagnosis Date  . Essential hypertension, benign   . Hyperlipidemia   . COPD (chronic obstructive pulmonary disease)   . Coronary atherosclerosis of native coronary artery     Multivessel status post CABG  . Insomnia   . Atrial flutter     Diagnosed by ECG August 2015    Past Surgical History  Procedure Laterality Date  . Coronary artery bypass graft  2005    Dr. Lucianne Lei Trigt: LIMA to LAD, right radial to circumflex, SVG to RCA    Current Outpatient Prescriptions  Medication Sig Dispense Refill  . albuterol (PROVENTIL HFA;VENTOLIN HFA) 108 (90 BASE) MCG/ACT inhaler Inhale 2 puffs into the lungs every 4 (four) hours as needed for wheezing. 3 Inhaler 1  . aspirin 81 MG tablet Take 81 mg by mouth every other day.     .  ezetimibe-simvastatin (VYTORIN) 10-40 MG per tablet TAKE 1 TABLET BY MOUTH AT BEDTIME 90 tablet 0  . fenofibrate 160 MG tablet TAKE 1 TABLET BY MOUTH EVERY DAY 90 tablet 0  . fish oil-omega-3 fatty acids 1000 MG capsule Take 2 g by mouth daily.    Marland Kitchen ketoconazole (NIZORAL) 2 % cream Apply 1 application topically 2 (two) times daily as needed for irritation. 30 g 0  . metoprolol (LOPRESSOR) 50 MG tablet TAKE 1 TABLET BY MOUTH TWICE A DAY 180 tablet 0  . valsartan (DIOVAN) 160 MG tablet Take 1 tablet (160 mg total) by mouth daily. 30 tablet 11  . zolpidem (AMBIEN) 10 MG tablet TAKE 1 TABLET BY MOUTH AT BEDTIME AS NEEDED FOR SLEEP 90 tablet 1   No current facility-administered medications for this visit.    Allergies as of 08/14/2015  . (No Known Allergies)    Family History  Problem Relation Age of Onset  . Hypertension Mother     Social History   Social History  . Marital Status: Married    Spouse Name: N/A  . Number of Children: N/A  . Years of Education: N/A   Occupational History  . Not on file.   Social History Main Topics  . Smoking status: Former Smoker -- 2.00 packs/day for 27 years    Types: Cigarettes    Start date: 02/01/1971    Quit date: 01/31/1998  . Smokeless tobacco: Current User    Types: Chew    Last Attempt to Quit: 01/13/1998  . Alcohol Use: No  . Drug  Use: No  . Sexual Activity: Not on file   Other Topics Concern  . Not on file   Social History Narrative    Review of Systems: General: Negative for anorexia, weight loss, fever, chills, fatigue, weakness. Eyes: Negative for vision changes.  ENT: Negative for hoarseness, difficulty swallowing. CV: Negative for chest pain, angina, palpitations, peripheral edema.  Respiratory: Negative for dyspnea at rest, cough, sputum, wheezing.  GI: See history of present illness. Derm: Negative for rash or itching.  Endo: Negative for unusual weight change.  Heme: Negative for bruising or  bleeding.   Physical Exam: BP 163/108 mmHg  Pulse 82  Temp(Src) 98.8 F (37.1 C) (Oral)  Ht 6\' 1"  (1.854 m)  Wt 210 lb 12.8 oz (95.618 kg)  BMI 27.82 kg/m2 General:   Alert and oriented. Pleasant and cooperative. Well-nourished and well-developed.  Head:  Normocephalic and atraumatic. Eyes:  Without icterus, sclera clear and conjunctiva pink.  Ears:  Normal auditory acuity. Cardiovascular:  S1, S2 present without murmurs appreciated. Normal pulses noted. Extremities without clubbing or edema. Respiratory:  Clear to auscultation bilaterally. No wheezes, rales, or rhonchi. No distress.  Gastrointestinal:  +BS, soft, non-tender and non-distended. No HSM noted. No guarding or rebound. No masses appreciated.  Rectal:  Deferred  Neurologic:  Alert and oriented x4;  grossly normal neurologically. Psych:  Alert and cooperative. Normal mood and affect. Heme/Lymph/Immune: No excessive bruising noted.    08/14/2015 9:28 AM

## 2015-08-14 NOTE — Assessment & Plan Note (Signed)
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 Is overdue at this time. Has occasional rectal bleeding. At some point he states he was told he has mild colitis although this is not been found on any of the colonoscopies done in our office. Also no records of this could be found. He did have hemorrhoids on his last colonoscopy. No rectal bleeding in the past 5 weeks. We'll proceed with surveillance colonoscopy as planned.  Proceed with TCS in the OR with propofol/MAC with Dr. Gala Romney in near future: the risks, benefits, and alternatives have been discussed with the patient in detail. The patient states understanding and desires to proceed.  Patient drinks as many as 8 drinks a day and also takes Ambien at night to sleep. Due to this we'll plan for procedure and the OR on propofol/MAC. Not on any anticoagulants.

## 2015-08-15 ENCOUNTER — Telehealth: Payer: Self-pay | Admitting: Internal Medicine

## 2015-08-15 NOTE — Telephone Encounter (Signed)
Per lab results: Result Note     Call patient : Studies are unremarkable, no change in recs   I spoke with patient about results and he verbalized understanding and had no questions.

## 2015-08-16 ENCOUNTER — Telehealth: Payer: Self-pay | Admitting: Internal Medicine

## 2015-08-16 ENCOUNTER — Encounter: Payer: Self-pay | Admitting: *Deleted

## 2015-08-16 NOTE — Telephone Encounter (Signed)
Very simple to correct this : Take the avapro twice daily and monitor bp over next week. If still elevated will add hydrodiuril 25 mg daily (when we re write the new bottle we can double he strength and add the diuretic in if necessary so he still only ends up with one pill instead of 3 even in worst case scenario)  We cannot help him here if he goes back on vasotec wo we need to work together to be sure this doesn't happen

## 2015-08-16 NOTE — Telephone Encounter (Signed)
Same exact instructions for diovan I just made for avapro

## 2015-08-16 NOTE — Telephone Encounter (Signed)
Spoke with pt's wife, states that bp has been high since changing BP meds on Monday from Vasotec to Avapro.  bp yesterday was 178/102-pt was feeling very lightheaded like he was going to pass out.  He yesterday took his avapro instead of his Vasotec and bp came down to 120/80's.  Pt wife is requesting alternate recs for pt as Vasotec is not managing pt's bp.    MW please advise.  Thanks!

## 2015-08-16 NOTE — Telephone Encounter (Signed)
Called spoke with spouse. She is confused bc they weren't given avapro at the pharmacy. Pt was given generic diovan 160 mg. Looking in pt chart, diovan is what was sent in. Please advise thanks

## 2015-08-16 NOTE — Telephone Encounter (Signed)
Spoke with pt, states that he and his employer wish for him to be out of work for the next six weeks while he tries this new bp med.  (see other phone phone note dated today).  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   Pt states this is not FMLA, but just a letter from [employer].  MW please advise if you're ok with this.  Thanks!

## 2015-08-16 NOTE — Telephone Encounter (Signed)
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

## 2015-08-16 NOTE — Telephone Encounter (Signed)
Called made spouse aware of below. She verbalized understanding and needed nothing further

## 2015-08-16 NOTE — Telephone Encounter (Signed)
Fine with me oow x 6weeks

## 2015-08-17 NOTE — Telephone Encounter (Signed)
Letter faxed to the number provided.

## 2015-08-17 NOTE — Telephone Encounter (Signed)
Forwarding to [place] to make aware.  Please advise if you need me to fax this letter.  Thanks!

## 2015-08-17 NOTE — Telephone Encounter (Signed)
Pt called with fax number for work [employer]

## 2015-08-20 ENCOUNTER — Telehealth: Payer: Self-pay | Admitting: Family Medicine

## 2015-08-20 ENCOUNTER — Telehealth: Payer: Self-pay | Admitting: Internal Medicine

## 2015-08-20 NOTE — Telephone Encounter (Signed)
Paperwork from [place] [employer] was faxed to our office on Friday morning at 9am. He was hoping to get the papers back that same day.  Patient calling to see if this has been done.  Patient wants to be able to use his Short term disability during his time off work.  Needs to have this paper filled out ASAP.  To [facility] for follow up

## 2015-08-20 NOTE — Telephone Encounter (Signed)
Patient is requesting to be worked in on Dr. Jeannine Kitten schedule tomorrow, 08/21/2015, to discuss a new doctor that has gotten involved in his health which is concerning to him.

## 2015-08-20 NOTE — Telephone Encounter (Signed)
Ok make sure he brings new meds with him

## 2015-08-20 NOTE — Telephone Encounter (Signed)
Appt scheduled, notified patient to bring new meds.

## 2015-08-21 ENCOUNTER — Ambulatory Visit (INDEPENDENT_AMBULATORY_CARE_PROVIDER_SITE_OTHER): Payer: BLUE CROSS/BLUE SHIELD | Admitting: Family Medicine

## 2015-08-21 ENCOUNTER — Encounter: Payer: Self-pay | Admitting: Family Medicine

## 2015-08-21 ENCOUNTER — Telehealth: Payer: Self-pay | Admitting: Internal Medicine

## 2015-08-21 VITALS — BP 112/80 | HR 82 | Ht 73.0 in | Wt 209.2 lb

## 2015-08-21 DIAGNOSIS — I10 Essential (primary) hypertension: Secondary | ICD-10-CM | POA: Diagnosis not present

## 2015-08-21 DIAGNOSIS — Z23 Encounter for immunization: Secondary | ICD-10-CM | POA: Diagnosis not present

## 2015-08-21 DIAGNOSIS — R0600 Dyspnea, unspecified: Secondary | ICD-10-CM

## 2015-08-21 NOTE — Telephone Encounter (Signed)
Spoke with pt.  Per telephone note 08/17/15 letter was faxed to his employer.  Pt requested that we refax this letter.  Refaxed to Hayes Ludwig at [phone].

## 2015-08-21 NOTE — Telephone Encounter (Signed)
Spoke with pt, states forms were faxed this afternoon to our office.  Pt is requesting this be done asap.  Forwarding to leslie to look out for forms.

## 2015-08-21 NOTE — Telephone Encounter (Signed)
[phone] pt calling back pt is leaving to go to another appt

## 2015-08-21 NOTE — Progress Notes (Signed)
   Subjective:    Patient ID: Dennis [name], male    DOB: 08-05-1951, 64 y.o.   MRN: [id number]  HPI Patient is here today because he states that Dr. Richardson Landry diagnosed him with COPD in the past but the new pulmonary doctor that our office referred him too, is stating that the patient does not have COPD and his shortness of breath is coming from his medication.   Patient wants to discuss this further with Dr. Richardson Landry.   Complete notes from pulmonary specialist reviewed at length during visit with patient today.  There was concern about potential contribution from his ACE inhibitor should this was changed to angiotensin blocker. Because of elevated blood pressure the pulmonary folks recommended increasing the valsartan. However patient elected to keep it the same. Today of note his blood pressure is good. Next  Reports ongoing shortness of breath.  Reports ongoing high frustration.     Review of Systems No headache no cough no chest pain no fever no chills no abdominal pain    Objective:   Physical Exam  Alert vitals stable HEENT normal lungs diminished breath sounds heart rare rhythm abdomen benign      Assessment & Plan:  Impression dyspnea likely multifactorial. I pointed out to patient that the pulmonary specialists are still in the midst of workup. He is not even had his PFTs yet. First A1c high response to the current changes which I think is very reasonable. Patient had multiple questions about this. I think it still remains to be seen whether he has an element of COPD, it seems so, his x-ray of even 10 years ago did suggest this. As far as hypertension currently blood pressure in good control at current level. Patient also asked about potential disability approaching. Discussed easily 25 minutes spent most in discussion WSL

## 2015-08-21 NOTE — Telephone Encounter (Signed)
I was not here 9/26 and I do not have any forms on this pt  MW has not seen anything either

## 2015-08-21 NOTE — Telephone Encounter (Signed)
He is having the forms faxed to [place] again to [phone]

## 2015-08-21 NOTE — Telephone Encounter (Signed)
I checked the up front fax, Dr Gustavus Bryant lookat and my desk and see no forms on this pt  I called spouse to notify her and verify the fax  She states she may just bring the forms by the office tomorrow   Will hold to confirm

## 2015-08-22 ENCOUNTER — Telehealth: Payer: Self-pay | Admitting: Internal Medicine

## 2015-08-22 NOTE — Telephone Encounter (Signed)
Form was received and placed in MW's lookat  Then will send to [facility] to [place] per her request

## 2015-08-22 NOTE — Telephone Encounter (Signed)
Spoke with the pt's spouse and notified form was completed  I have sent this down to healthport  Nothing further needed

## 2015-08-22 NOTE — Patient Instructions (Signed)
Dennis [name]  08/22/2015     @PREFPERIOPPHARMACY @   Your procedure is scheduled on 08/30/2015.  Report to [facility] at 6:30 A.M.  Call this number if you have problems the morning of surgery:  [phone]   Remember:  FOLLOW INSTRUCTIONS, GIVEN TO YOU BY DR Roseanne Kaufman OFFICE REGARDING WHEN TO STOP EATING AND DRINKING LIQUIDS  Take these medicines the morning of surgery with A SIP OF WATER  ALBUTEROL ([place]), METOPROLOL, DIOVAN   Do not wear jewelry, make-up or nail polish.  Do not wear lotions, powders, or perfumes.  You may wear deodorant.  Do not shave 48 hours prior to surgery.  Men may shave face and neck.  Do not bring valuables to the hospital.  [facility] is not responsible for any belongings or valuables.  Contacts, dentures or bridgework may not be worn into surgery.  Leave your suitcase in the car.  After surgery it may be brought to your room.  For patients admitted to the hospital, discharge time will be determined by your treatment team.  Patients discharged the day of surgery will not be allowed to drive home.   Please read over the following fact sheets that you were given. Anesthesia Post-op Instructions     PATIENT INSTRUCTIONS POST-ANESTHESIA  IMMEDIATELY FOLLOWING SURGERY:  Do not drive or operate machinery for the first twenty four hours after surgery.  Do not make any important decisions for twenty four hours after surgery or while taking narcotic pain medications or sedatives.  If you develop intractable nausea and vomiting or a severe headache please notify your doctor immediately.  FOLLOW-UP:  Please make an appointment with your surgeon as instructed. You do not need to follow up with anesthesia unless specifically instructed to do so.  WOUND CARE INSTRUCTIONS (if applicable):  Keep a dry clean dressing on the anesthesia/puncture wound site if there is drainage.  Once the wound has quit draining you may leave it open to air.   Generally you should leave the bandage intact for twenty four hours unless there is drainage.  If the epidural site drains for more than 36-48 hours please call the anesthesia department.  QUESTIONS?:  Please feel free to call your physician or the hospital operator if you have any questions, and they will be happy to assist you.      Colonoscopy A colonoscopy is an exam to look at the entire large intestine (colon). This exam can help find problems such as tumors, polyps, inflammation, and areas of bleeding. The exam takes about 1 hour.  LET [facility] CARE PROVIDER KNOW ABOUT:   Any allergies you have.  All medicines you are taking, including vitamins, herbs, eye drops, creams, and over-the-counter medicines.  Previous problems you or members of your family have had with the use of anesthetics.  Any blood disorders you have.  Previous surgeries you have had.  Medical conditions you have. RISKS AND COMPLICATIONS  Generally, this is a safe procedure. However, as with any procedure, complications can occur. Possible complications include:  Bleeding.  Tearing or rupture of the colon wall.  Reaction to medicines given during the exam.  Infection (rare). BEFORE THE PROCEDURE   Ask your health care provider about changing or stopping your regular medicines.  You may be prescribed an oral bowel prep. This involves drinking a large amount of medicated liquid, starting the day before your procedure. The liquid will cause you to have multiple loose stools until your  stool is almost clear or light green. This cleans out your colon in preparation for the procedure.  Do not eat or drink anything else once you have started the bowel prep, unless your health care provider tells you it is safe to do so.  Arrange for someone to drive you home after the procedure. PROCEDURE   You will be given medicine to help you relax (sedative).  You will lie on your side with your knees bent.  A  long, flexible tube with a light and camera on the end (colonoscope) will be inserted through the rectum and into the colon. The camera sends video back to a computer screen as it moves through the colon. The colonoscope also releases carbon dioxide gas to inflate the colon. This helps your health care provider see the area better.  During the exam, your health care provider may take a small tissue sample (biopsy) to be examined under a microscope if any abnormalities are found.  The exam is finished when the entire colon has been viewed. AFTER THE PROCEDURE   Do not drive for 24 hours after the exam.  You may have a small amount of blood in your stool.  You may pass moderate amounts of gas and have mild abdominal cramping or bloating. This is caused by the gas used to inflate your colon during the exam.  Ask when your test results will be ready and how you will get your results. Make sure you get your test results. Document Released: 11/07/2000 Document Revised: 08/31/2013 Document Reviewed: 07/18/2013 [facility] Patient Information 2015 [place], [place]. This information is not intended to replace advice given to you by your health care provider. Make sure you discuss any questions you have with your health care provider.

## 2015-08-22 NOTE — Telephone Encounter (Signed)
Pt returning call about form, placed form @ leslie's desk this morning pt needs this faxed back asap.Dennis [name]

## 2015-08-22 NOTE — Telephone Encounter (Signed)
Dennis [name], CMA at 08/22/2015 1:29 PM     Status: Signed       Expand All Collapse All   Spoke with the pt's spouse and notified form was completed  I have sent this down to [employer]port  Nothing further needed      --

## 2015-08-22 NOTE — Telephone Encounter (Signed)
Will route message to [place] to follow up on.

## 2015-08-22 NOTE — Telephone Encounter (Signed)
Spoke with pt. He reports as of 45 min his employer did not receive the forms needed. Pt was very upset that it took this long for this to be taken care of. He reports he has spoken with over dozen people since Friday. Advised pt that any FMLA/STD goes through a process where it goes through our health port and the actual process can take up to 5-7 business days. Pt is aware will check with healthport in AM to see the status of his forms being faxed.

## 2015-08-23 ENCOUNTER — Telehealth: Payer: Self-pay | Admitting: Internal Medicine

## 2015-08-23 ENCOUNTER — Encounter (HOSPITAL_COMMUNITY): Payer: Self-pay

## 2015-08-23 ENCOUNTER — Encounter: Payer: Self-pay | Admitting: Internal Medicine

## 2015-08-23 ENCOUNTER — Encounter (HOSPITAL_COMMUNITY)
Admission: RE | Admit: 2015-08-23 | Discharge: 2015-08-23 | Disposition: A | Discharge status: Home / Self Care | Payer: BLUE CROSS/BLUE SHIELD | Source: Ambulatory Visit | Attending: Internal Medicine | Admitting: Internal Medicine

## 2015-08-23 DIAGNOSIS — K625 Hemorrhage of anus and rectum: Secondary | ICD-10-CM | POA: Insufficient documentation

## 2015-08-23 DIAGNOSIS — Z01818 Encounter for other preprocedural examination: Secondary | ICD-10-CM | POA: Diagnosis present

## 2015-08-23 LAB — CBC
HEMATOCRIT: 44.3 % (ref 39.0–52.0)
HEMOGLOBIN: 15 g/dL (ref 13.0–17.0)
MCH: 31.1 pg (ref 26.0–34.0)
MCHC: 33.9 g/dL (ref 30.0–36.0)
MCV: 91.7 fL (ref 78.0–100.0)
Platelets: 163 10*3/uL (ref 150–400)
RBC: 4.83 MIL/uL (ref 4.22–5.81)
RDW: 12.4 % (ref 11.5–15.5)
WBC: 7.5 10*3/uL (ref 4.0–10.5)

## 2015-08-23 LAB — BASIC METABOLIC PANEL
Anion gap: 7 (ref 5–15)
BUN: 26 mg/dL — AB (ref 6–20)
CHLORIDE: 102 mmol/L (ref 101–111)
CO2: 25 mmol/L (ref 22–32)
Calcium: 9 mg/dL (ref 8.9–10.3)
Creatinine, Ser: 1.29 mg/dL — ABNORMAL HIGH (ref 0.61–1.24)
GFR calc non Af Amer: 58 mL/min — ABNORMAL LOW (ref >60)
Glucose, Bld: 113 mg/dL — ABNORMAL HIGH (ref 65–99)
POTASSIUM: 5.1 mmol/L (ref 3.5–5.1)
SODIUM: 134 mmol/L — AB (ref 135–145)

## 2015-08-23 NOTE — Telephone Encounter (Signed)
Called and spoke with Elmyra Ricks from [facility] Was informed that paperwork from short term disability was received yesterday and filled out and faxed to employer Elmyra Ricks also stated that she sent patient an authorization form and payment letter  Pt was called and informed of conversation with Elmyra Ricks Pt stated that he would call his employer this morning and see if they received paperwork Pt stated that he would call office back if paperwork was not received Informed pt that he could also call main # and ask for medical records  Nothing further is needed at this time

## 2015-08-23 NOTE — Telephone Encounter (Signed)
STD form was received 08/22/15 am, filled out and signed by Dr Melvyn Novas and sent to Nicole's attn in [place] Dept via interoffice envelope same afternoon around lunch time  This am we learned that this form had not been received for unknown reason  In Dr Gustavus Bryant absence, Dr Annamaria Boots signed the form  I have faxed this myself to the pt's employer and received confirmation that this was received  I have made copies of the form and fax confirmation and given to Dennison Bulla, Director of Pulmonary Dept  I also gave original to [place] in [place] (handed to her in person) Dennison Bulla spoke with the pt's spouse and notified of the above  Nothing further needed at this time

## 2015-08-23 NOTE — Telephone Encounter (Signed)
Called and had to [facility] for Dennis [name] in healthport.

## 2015-08-23 NOTE — Telephone Encounter (Signed)
Called down to health port and received VM. wcb

## 2015-08-27 ENCOUNTER — Ambulatory Visit: Payer: BLUE CROSS/BLUE SHIELD | Admitting: Family Medicine

## 2015-08-30 ENCOUNTER — Ambulatory Visit (HOSPITAL_COMMUNITY): Payer: BLUE CROSS/BLUE SHIELD | Admitting: Anesthesiology

## 2015-08-30 ENCOUNTER — Encounter (HOSPITAL_COMMUNITY)
Admission: RE | Disposition: A | Discharge status: Home Health | Payer: Self-pay | Source: Ambulatory Visit | Attending: Internal Medicine

## 2015-08-30 ENCOUNTER — Ambulatory Visit (HOSPITAL_COMMUNITY)
Admission: RE | Admit: 2015-08-30 | Discharge: 2015-08-30 | Disposition: A | Discharge status: Home Health | Payer: BLUE CROSS/BLUE SHIELD | Source: Ambulatory Visit | Attending: Internal Medicine | Admitting: Internal Medicine

## 2015-08-30 ENCOUNTER — Encounter (HOSPITAL_COMMUNITY): Payer: Self-pay | Admitting: *Deleted

## 2015-08-30 DIAGNOSIS — I2510 Atherosclerotic heart disease of native coronary artery without angina pectoris: Secondary | ICD-10-CM | POA: Diagnosis not present

## 2015-08-30 DIAGNOSIS — K649 Unspecified hemorrhoids: Secondary | ICD-10-CM | POA: Insufficient documentation

## 2015-08-30 DIAGNOSIS — J449 Chronic obstructive pulmonary disease, unspecified: Secondary | ICD-10-CM | POA: Diagnosis not present

## 2015-08-30 DIAGNOSIS — I4891 Unspecified atrial fibrillation: Secondary | ICD-10-CM | POA: Insufficient documentation

## 2015-08-30 DIAGNOSIS — Z951 Presence of aortocoronary bypass graft: Secondary | ICD-10-CM | POA: Diagnosis not present

## 2015-08-30 DIAGNOSIS — Z7982 Long term (current) use of aspirin: Secondary | ICD-10-CM | POA: Insufficient documentation

## 2015-08-30 DIAGNOSIS — Z86010 Personal history of colonic polyps: Secondary | ICD-10-CM | POA: Insufficient documentation

## 2015-08-30 DIAGNOSIS — K648 Other hemorrhoids: Secondary | ICD-10-CM | POA: Insufficient documentation

## 2015-08-30 DIAGNOSIS — K5730 Diverticulosis of large intestine without perforation or abscess without bleeding: Secondary | ICD-10-CM | POA: Diagnosis not present

## 2015-08-30 DIAGNOSIS — K921 Melena: Secondary | ICD-10-CM | POA: Diagnosis not present

## 2015-08-30 DIAGNOSIS — Z79899 Other long term (current) drug therapy: Secondary | ICD-10-CM | POA: Insufficient documentation

## 2015-08-30 HISTORY — DX: Noninfective gastroenteritis and colitis, unspecified: K52.9

## 2015-08-30 HISTORY — PX: COLONOSCOPY WITH PROPOFOL: SHX5780

## 2015-08-30 SURGERY — COLONOSCOPY WITH PROPOFOL
Anesthesia: Monitor Anesthesia Care

## 2015-08-30 MED ORDER — PROPOFOL 500 MG/50ML IV EMUL
INTRAVENOUS | Status: DC | PRN
  Administered 2015-08-30: 125 ug/kg/min via INTRAVENOUS

## 2015-08-30 MED ORDER — LACTATED RINGERS IV SOLN
INTRAVENOUS | Status: DC
  Administered 2015-08-30: 08:00:00 via INTRAVENOUS

## 2015-08-30 MED ORDER — STERILE WATER FOR IRRIGATION IR SOLN
Status: DC | PRN
  Administered 2015-08-30: 1000 mL

## 2015-08-30 MED ORDER — SODIUM CHLORIDE 0.9 % IJ SOLN
INTRAMUSCULAR | Status: AC
  Filled 2015-08-30: qty 10

## 2015-08-30 MED ORDER — MIDAZOLAM HCL 2 MG/2ML IJ SOLN
INTRAMUSCULAR | Status: AC
  Filled 2015-08-30: qty 4

## 2015-08-30 MED ORDER — PROPOFOL 10 MG/ML IV BOLUS
INTRAVENOUS | Status: AC
  Filled 2015-08-30: qty 20

## 2015-08-30 MED ORDER — ONDANSETRON HCL 4 MG/2ML IJ SOLN
4.0000 mg | Freq: Once | INTRAMUSCULAR | Status: AC
  Administered 2015-08-30: 4 mg via INTRAVENOUS

## 2015-08-30 MED ORDER — MIDAZOLAM HCL 5 MG/5ML IJ SOLN
INTRAMUSCULAR | Status: DC | PRN
  Administered 2015-08-30: 2 mg via INTRAVENOUS

## 2015-08-30 MED ORDER — MIDAZOLAM HCL 2 MG/2ML IJ SOLN
INTRAMUSCULAR | Status: AC
  Filled 2015-08-30: qty 2

## 2015-08-30 MED ORDER — EPHEDRINE SULFATE 50 MG/ML IJ SOLN
INTRAMUSCULAR | Status: DC | PRN
  Administered 2015-08-30 (×2): 5 mg via INTRAVENOUS

## 2015-08-30 MED ORDER — MIDAZOLAM HCL 2 MG/2ML IJ SOLN
1.0000 mg | INTRAMUSCULAR | Status: DC | PRN
  Administered 2015-08-30: 2 mg via INTRAVENOUS

## 2015-08-30 MED ORDER — FENTANYL CITRATE (PF) 100 MCG/2ML IJ SOLN
INTRAMUSCULAR | Status: AC
Start: 2015-08-30 — End: 2015-08-30
  Filled 2015-08-30: qty 2

## 2015-08-30 MED ORDER — FENTANYL CITRATE (PF) 100 MCG/2ML IJ SOLN: 25.0000 ug | INTRAMUSCULAR | Status: DC | PRN

## 2015-08-30 MED ORDER — ONDANSETRON HCL 4 MG/2ML IJ SOLN: 4.0000 mg | Freq: Once | INTRAMUSCULAR | Status: DC | PRN

## 2015-08-30 MED ORDER — EPHEDRINE SULFATE 50 MG/ML IJ SOLN
INTRAMUSCULAR | Status: AC
  Filled 2015-08-30: qty 1

## 2015-08-30 MED ORDER — ONDANSETRON HCL 4 MG/2ML IJ SOLN
INTRAMUSCULAR | Status: AC
  Filled 2015-08-30: qty 2

## 2015-08-30 MED ORDER — FENTANYL CITRATE (PF) 100 MCG/2ML IJ SOLN
25.0000 ug | INTRAMUSCULAR | Status: AC
  Administered 2015-08-30 (×2): 25 ug via INTRAVENOUS

## 2015-08-30 SURGICAL SUPPLY — 5 items
KIT ENDO PROCEDURE PEN (KITS) ×2 IMPLANT
MANIFOLD NEPTUNE II (INSTRUMENTS) ×2 IMPLANT
SYR 50ML LL SCALE MARK (SYRINGE) ×2 IMPLANT
TUBING IRRIGATION ENDOGATOR (MISCELLANEOUS) ×2 IMPLANT
WATER STERILE IRR 1000ML POUR (IV SOLUTION) ×2 IMPLANT

## 2015-08-30 NOTE — Discharge Instructions (Signed)
Colonoscopy Discharge Instructions  Read the instructions outlined below and refer to this sheet in the next few weeks. These discharge instructions provide you with general information on caring for yourself after you leave the hospital. Your doctor may also give you specific instructions. While your treatment has been planned according to the most current medical practices available, unavoidable complications occasionally occur. If you have any problems or questions after discharge, call Dr. Gala Romney at [phone]. ACTIVITY  You may resume your regular activity, but move at a slower pace for the next 24 hours.   Take frequent rest periods for the next 24 hours.   Walking will help get rid of the air and reduce the bloated feeling in your belly (abdomen).   No driving for 24 hours (because of the medicine (anesthesia) used during the test).    Do not sign any important legal documents or operate any machinery for 24 hours (because of the anesthesia used during the test).  NUTRITION  Drink plenty of fluids.   You may resume your normal diet as instructed by your doctor.   Begin with a light meal and progress to your normal diet. Heavy or fried foods are harder to digest and may make you feel sick to your stomach (nauseated).   Avoid alcoholic beverages for 24 hours or as instructed.  MEDICATIONS  You may resume your normal medications unless your doctor tells you otherwise.  WHAT YOU CAN EXPECT TODAY  Some feelings of bloating in the abdomen.   Passage of more gas than usual.   Spotting of blood in your stool or on the toilet paper.  IF YOU HAD POLYPS REMOVED DURING THE COLONOSCOPY:  No aspirin products for 7 days or as instructed.   No alcohol for 7 days or as instructed.   Eat a soft diet for the next 24 hours.  FINDING OUT THE RESULTS OF YOUR TEST Not all test results are available during your visit. If your test results are not back during the visit, make an appointment  with your caregiver to find out the results. Do not assume everything is normal if you have not heard from your caregiver or the medical facility. It is important for you to follow up on all of your test results.  SEEK IMMEDIATE MEDICAL ATTENTION IF:  You have more than a spotting of blood in your stool.   Your belly is swollen (abdominal distention).   You are nauseated or vomiting.   You have a temperature over 101.   You have abdominal pain or discomfort that is severe or gets worse throughout the day.    Diverticulosis and hemorrhoid information provided  Begin Benefiber 2 teaspoons twice daily  Anusol suppositories 1 per rectum twice daily for 10 days  Repeat colonoscopy in 5 years  Hemorrhoids Hemorrhoids are swollen veins around the rectum or anus. There are two types of hemorrhoids:   Internal hemorrhoids. These occur in the veins just inside the rectum. They may poke through to the outside and become irritated and painful.  External hemorrhoids. These occur in the veins outside the anus and can be felt as a painful swelling or hard lump near the anus. CAUSES  Pregnancy.   Obesity.   Constipation or diarrhea.   Straining to have a bowel movement.   Sitting for long periods on the toilet.  Heavy lifting or other activity that caused you to strain.  Anal intercourse. SYMPTOMS   Pain.   Anal itching or irritation.  Rectal bleeding.   Fecal leakage.   Anal swelling.   One or more lumps around the anus.  DIAGNOSIS  Your caregiver may be able to diagnose hemorrhoids by visual examination. Other examinations or tests that may be performed include:   Examination of the rectal area with a gloved hand (digital rectal exam).   Examination of anal canal using a small tube (scope).   A blood test if you have lost a significant amount of blood.  A test to look inside the colon (sigmoidoscopy or colonoscopy). TREATMENT Most hemorrhoids can be  treated at home. However, if symptoms do not seem to be getting better or if you have a lot of rectal bleeding, your caregiver may perform a procedure to help make the hemorrhoids get smaller or remove them completely. Possible treatments include:   Placing a rubber band at the base of the hemorrhoid to cut off the circulation (rubber band ligation).   Injecting a chemical to shrink the hemorrhoid (sclerotherapy).   Using a tool to burn the hemorrhoid (infrared light therapy).   Surgically removing the hemorrhoid (hemorrhoidectomy).   Stapling the hemorrhoid to block blood flow to the tissue (hemorrhoid stapling).  HOME CARE INSTRUCTIONS   Eat foods with fiber, such as whole grains, beans, nuts, fruits, and vegetables. Ask your doctor about taking products with added fiber in them (fibersupplements).  Increase fluid intake. Drink enough water and fluids to keep your urine clear or pale yellow.   Exercise regularly.   Go to the bathroom when you have the urge to have a bowel movement. Do not wait.   Avoid straining to have bowel movements.   Keep the anal area dry and clean. Use wet toilet paper or moist towelettes after a bowel movement.   Medicated creams and suppositories may be used or applied as directed.   Only take over-the-counter or prescription medicines as directed by your caregiver.   Take warm sitz baths for 15-20 minutes, 3-4 times a day to ease pain and discomfort.   Place ice packs on the hemorrhoids if they are tender and swollen. Using ice packs between sitz baths may be helpful.   Put ice in a plastic bag.   Place a towel between your skin and the bag.   Leave the ice on for 15-20 minutes, 3-4 times a day.   Do not use a donut-shaped pillow or sit on the toilet for long periods. This increases blood pooling and pain.  SEEK MEDICAL CARE IF:  You have increasing pain and swelling that is not controlled by treatment or medicine.  You have  uncontrolled bleeding.  You have difficulty or you are unable to have a bowel movement.  You have pain or inflammation outside the area of the hemorrhoids. MAKE SURE YOU:  Understand these instructions.  Will watch your condition.  Will get help right away if you are not doing well or get worse.   This information is not intended to replace advice given to you by your health care provider. Make sure you discuss any questions you have with your health care provider.    Office visit with [place] in 6 months  Diverticulosis Diverticulosis is the condition that develops when small pouches (diverticula) form in the wall of your colon. Your colon, or large intestine, is where water is absorbed and stool is formed. The pouches form when the inside layer of your colon pushes through weak spots in the outer layers of your colon. CAUSES  No one  knows exactly what causes diverticulosis. RISK FACTORS  Being older than 75. Your risk for this condition increases with age. Diverticulosis is rare in people younger than 40 years. By age 8, almost everyone has it.  Eating a low-fiber diet.  Being frequently constipated.  Being overweight.  Not getting enough exercise.  Smoking.  Taking over-the-counter pain medicines, like aspirin and ibuprofen. SYMPTOMS  Most people with diverticulosis do not have symptoms. DIAGNOSIS  Because diverticulosis often has no symptoms, health care providers often discover the condition during an exam for other colon problems. In many cases, a health care provider will diagnose diverticulosis while using a flexible scope to examine the colon (colonoscopy). TREATMENT  If you have never developed an infection related to diverticulosis, you may not need treatment. If you have had an infection before, treatment may include:  Eating more fruits, vegetables, and grains.  Taking a fiber supplement.  Taking a live bacteria supplement (probiotic).  Taking medicine to  relax your colon. HOME CARE INSTRUCTIONS   Drink at least 6-8 glasses of water each day to prevent constipation.  Try not to strain when you have a bowel movement.  Keep all follow-up appointments. If you have had an infection before:  Increase the fiber in your diet as directed by your health care provider or dietitian.  Take a dietary fiber supplement if your health care provider approves.  Only take medicines as directed by your health care provider. SEEK MEDICAL CARE IF:   You have abdominal pain.  You have bloating.  You have cramps.  You have not gone to the bathroom in 3 days. SEEK IMMEDIATE MEDICAL CARE IF:   Your pain gets worse.  Yourbloating becomes very bad.  You have a fever or chills, and your symptoms suddenly get worse.  You begin vomiting.  You have bowel movements that are bloody or black. MAKE SURE YOU:  Understand these instructions.  Will watch your condition.  Will get help right away if you are not doing well or get worse.   This information is not intended to replace advice given to you by your health care provider. Make sure you discuss any questions you have with your health care provider.

## 2015-08-30 NOTE — Anesthesia Preprocedure Evaluation (Signed)
Anesthesia Evaluation  Patient identified by MRN, date of birth, ID band Patient awake    Reviewed: Allergy & Precautions, NPO status , Patient's Chart, lab work & pertinent test results  Airway Mallampati: II       Dental  (+) Edentulous Upper   Pulmonary shortness of breath and with exertion, COPD, former smoker,    breath sounds clear to auscultation       Cardiovascular hypertension, Pt. on medications and Pt. on home beta blockers + CAD and + CABG  + dysrhythmias Atrial Fibrillation  Rhythm:Irregular Rate:Normal     Neuro/Psych    GI/Hepatic negative GI ROS,   Endo/Other    Renal/GU      Musculoskeletal   Abdominal   Peds  Hematology   Anesthesia Other Findings   Reproductive/Obstetrics                             Anesthesia Physical Anesthesia Plan  ASA: III  Anesthesia Plan: MAC   Post-op Pain Management:    Induction: Intravenous  Airway Management Planned:   Additional Equipment:   Intra-op Plan:   Post-operative Plan:   Informed Consent: I have reviewed the patients History and Physical, chart, labs and discussed the procedure including the risks, benefits and alternatives for the proposed anesthesia with the patient or authorized representative who has indicated his/her understanding and acceptance.     Plan Discussed with:   Anesthesia Plan Comments:         Anesthesia Quick Evaluation

## 2015-08-30 NOTE — Interval H&P Note (Signed)
History and Physical Interval Note:  08/30/2015 7:50 AM  Dennis [name]  has presented today for surgery, with the diagnosis of history of colon adenoma, rectal bleeding  The various methods of treatment have been discussed with the patient and family. After consideration of risks, benefits and other options for treatment, the patient has consented to  Procedure(s) with comments: COLONOSCOPY WITH PROPOFOL (N/A) - 0800 as a surgical intervention .  The patient's history has been reviewed, patient examined, no change in status, stable for surgery.  I have reviewed the patient's chart and labs.  Questions were answered to the patient's satisfaction.     [name]     No change.  Diagnostic TCS per ploan.  The risks, benefits, limitations, alternatives and imponderables have been reviewed with the patient. Questions have been answered. All parties are agreeable.

## 2015-08-30 NOTE — H&P (View-Only) (Signed)
Primary Care Physician:  Mickie Hillier, MD Primary Gastroenterologist:  Dr. Gala Romney  Chief Complaint  Patient presents with  . Colonoscopy    HPI:   64 year old male presents for follow-up and rescheduling a surveillance colonoscopy. 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 His colonoscopy found minimal internal hemorrhoids, otherwise normal rectum, scattered left-sided diverticula, diminutive polyp which was removed and found to be benign polypoid colonic mucosa on pathology. Recommended 5 year repeat colonoscopy. At this point he is overdue.  Today he states he was having episode of abdominal pain about 8 months ago which self-resolved. Denies N/V. Admits occasional rectal bleeding, but was told at some point he has colitis, although no record of this could be found. Did have hemorrhoids on last colonoscopy.No rectal bleeding in the past 5 weeks. Denies fever, chills, unintnentional weight loss. Drinks daily and takes Ambien for sleep. Denies chest pain, dyspnea, dizziness, lightheadedness, syncope, near syncope. Denies any other upper or lower GI symptoms.  Past Medical History  Diagnosis Date  . Essential hypertension, benign   . Hyperlipidemia   . COPD (chronic obstructive pulmonary disease)   . Coronary atherosclerosis of native coronary artery     Multivessel status post CABG  . Insomnia   . Atrial flutter     Diagnosed by ECG August 2015    Past Surgical History  Procedure Laterality Date  . Coronary artery bypass graft  2005    Dr. Lucianne Lei Trigt: LIMA to LAD, right radial to circumflex, SVG to RCA    Current Outpatient Prescriptions  Medication Sig Dispense Refill  . albuterol (PROVENTIL HFA;VENTOLIN HFA) 108 (90 BASE) MCG/ACT inhaler Inhale 2 puffs into the lungs every 4 (four) hours as needed for wheezing. 3 Inhaler 1  . aspirin 81 MG tablet Take 81 mg by mouth every other day.     .  ezetimibe-simvastatin (VYTORIN) 10-40 MG per tablet TAKE 1 TABLET BY MOUTH AT BEDTIME 90 tablet 0  . fenofibrate 160 MG tablet TAKE 1 TABLET BY MOUTH EVERY DAY 90 tablet 0  . fish oil-omega-3 fatty acids 1000 MG capsule Take 2 g by mouth daily.    Marland Kitchen ketoconazole (NIZORAL) 2 % cream Apply 1 application topically 2 (two) times daily as needed for irritation. 30 g 0  . metoprolol (LOPRESSOR) 50 MG tablet TAKE 1 TABLET BY MOUTH TWICE A DAY 180 tablet 0  . valsartan (DIOVAN) 160 MG tablet Take 1 tablet (160 mg total) by mouth daily. 30 tablet 11  . zolpidem (AMBIEN) 10 MG tablet TAKE 1 TABLET BY MOUTH AT BEDTIME AS NEEDED FOR SLEEP 90 tablet 1   No current facility-administered medications for this visit.    Allergies as of 08/14/2015  . (No Known Allergies)    Family History  Problem Relation Age of Onset  . Hypertension Mother     Social History   Social History  . Marital Status: Married    Spouse Name: N/A  . Number of Children: N/A  . Years of Education: N/A   Occupational History  . Not on file.   Social History Main Topics  . Smoking status: Former Smoker -- 2.00 packs/day for 27 years    Types: Cigarettes    Start date: 02/01/1971    Quit date: 01/31/1998  . Smokeless tobacco: Current User    Types: Chew    Last Attempt to Quit: 01/13/1998  . Alcohol Use: No  . Drug  Use: No  . Sexual Activity: Not on file   Other Topics Concern  . Not on file   Social History Narrative    Review of Systems: General: Negative for anorexia, weight loss, fever, chills, fatigue, weakness. Eyes: Negative for vision changes.  ENT: Negative for hoarseness, difficulty swallowing. CV: Negative for chest pain, angina, palpitations, peripheral edema.  Respiratory: Negative for dyspnea at rest, cough, sputum, wheezing.  GI: See history of present illness. Derm: Negative for rash or itching.  Endo: Negative for unusual weight change.  Heme: Negative for bruising or  bleeding.   Physical Exam: BP 163/108 mmHg  Pulse 82  Temp(Src) 98.8 F (37.1 C) (Oral)  Ht 6\' 1"  (1.854 m)  Wt 210 lb 12.8 oz (95.618 kg)  BMI 27.82 kg/m2 General:   Alert and oriented. Pleasant and cooperative. Well-nourished and well-developed.  Head:  Normocephalic and atraumatic. Eyes:  Without icterus, sclera clear and conjunctiva pink.  Ears:  Normal auditory acuity. Cardiovascular:  S1, S2 present without murmurs appreciated. Normal pulses noted. Extremities without clubbing or edema. Respiratory:  Clear to auscultation bilaterally. No wheezes, rales, or rhonchi. No distress.  Gastrointestinal:  +BS, soft, non-tender and non-distended. No HSM noted. No guarding or rebound. No masses appreciated.  Rectal:  Deferred  Neurologic:  Alert and oriented x4;  grossly normal neurologically. Psych:  Alert and cooperative. Normal mood and affect. Heme/Lymph/Immune: No excessive bruising noted.    08/14/2015 9:28 AM

## 2015-08-30 NOTE — Op Note (Signed)
[facility] [address] [place], [address]   COLONOSCOPY PROCEDURE REPORT  PATIENT: Dennis [name], Dennis [name]  MR#: [id number] BIRTHDATE: [birth year] , 64  yrs. old GENDER: male ENDOSCOPIST: R.  Garfield Cornea, MD FACP [facility] REFERRED GY:BWLSLHT [name], M.D. PROCEDURE DATE:  12-Sep-2015 PROCEDURE:   Ileo-colonoscopy, diagnostic INDICATIONS:hematochezia; history of advanced adenoma. MEDICATIONS: Deep sedation per Dr.  [facility] ASA CLASS:       Class II  CONSENT: The risks, benefits, alternatives and imponderables including but not limited to bleeding, perforation as well as the possibility of a missed lesion have been reviewed.  The potential for biopsy, lesion removal, etc. have also been discussed. Questions have been answered.  All parties agreeable.  Please see the history and physical in the medical record for more information.  DESCRIPTION OF PROCEDURE:   After the risks benefits and alternatives of the procedure were thoroughly explained, informed consent was obtained.  The digital rectal exam revealed no abnormalities of the rectum.   The     endoscope was introduced through the anus and advanced to the terminal ileum which was intubated for a short distance. No adverse events experienced. The quality of the prep was adequate  The instrument was then slowly withdrawn as the colon was fully examined. Estimated blood loss is zero unless otherwise noted in this procedure report.      COLON FINDINGS: Internal hemorrhoids; otherwise, normal appearing rectal mucosa.  Shallow left-sided diverticula; the remainder of the colonic mucosa appeared normal.  The distal 10 cm of terminal ileal mucosa also appeared normal.  Retroflexion was performed. .  Withdrawal time=8 minutes 0 seconds.  The scope was withdrawn and the procedure completed. COMPLICATIONS: There were no immediate complications.  ENDOSCOPIC IMPRESSION: Internal hemorrhoids -  likely source  of hematochezia; otherwise normal ileal colonoscopy  RECOMMENDATIONS: Ten-day course of Anusol suppositories. Add Benefiber to the regimen. Depending on his response, he may be a reasonably good banding candidate. Repeat colonoscopy for surveillance purposes in 5 years. Office visit in 6 months.  eSigned:  R. Garfield Cornea, MD Rosalita Chessman [facility] September 12, 2015 8:33 AM   cc:  CPT CODES: ICD CODES:  The ICD and CPT codes recommended by this software are interpretations from the data that the clinical staff has captured with the software.  The verification of the translation of this report to the ICD and CPT codes and modifiers is the sole responsibility of the health care institution and practicing physician where this report was generated.  [employer]. will not be held responsible for the validity of the ICD and CPT codes included on this report.  AMA assumes no liability for data contained or not contained herein. CPT is a Designer, television/film set of the [employer].  PATIENT NAME:  Dennis [name], Dennis [name] MR#: [id number]

## 2015-08-30 NOTE — Transfer of Care (Signed)
Immediate Anesthesia Transfer of Care Note  Patient: Dennis [name]  Procedure(s) Performed: Procedure(s): COLONOSCOPY WITH PROPOFOL at cecum at [phone]; withdrawal time=8 minutes (N/A)  Patient Location: PACU  Anesthesia Type:MAC  Level of Consciousness: awake, alert  and patient cooperative  Airway & Oxygen Therapy: Patient Spontanous Breathing and Patient connected to nasal cannula oxygen  Post-op Assessment: Report given to RN, Post -op Vital signs reviewed and stable and Patient moving all extremities  Post vital signs: Reviewed and stable  Last Vitals:  Filed Vitals:   08/30/15 0748  BP: 75/51  Pulse:   Temp:   Resp: 15    Complications: No apparent anesthesia complications

## 2015-08-30 NOTE — Anesthesia Postprocedure Evaluation (Signed)
  Anesthesia Post-op Note  Patient: Dennis [name]  Procedure(s) Performed: Procedure(s): COLONOSCOPY WITH PROPOFOL at cecum at [phone]; withdrawal time=8 minutes (N/A)  Patient Location: PACU  Anesthesia Type:MAC  Level of Consciousness: awake, alert , oriented and patient cooperative  Airway and Oxygen Therapy: Patient Spontanous Breathing  Post-op Pain: none  Post-op Assessment: Post-op Vital signs reviewed, Patient's Cardiovascular Status Stable, Respiratory Function Stable, Patent Airway, No signs of Nausea or vomiting and No headache              Post-op Vital Signs: Reviewed and stable  Last Vitals:  Filed Vitals:   08/30/15 0830  BP: 82/43  Pulse: 88  Temp: 36.7 C  Resp: 9    Complications: No apparent anesthesia complications

## 2015-08-31 ENCOUNTER — Encounter (HOSPITAL_COMMUNITY): Payer: Self-pay | Admitting: Internal Medicine

## 2015-09-06 ENCOUNTER — Ambulatory Visit: Payer: BLUE CROSS/BLUE SHIELD | Admitting: Family Medicine

## 2015-09-10 ENCOUNTER — Telehealth: Payer: Self-pay | Admitting: Family Medicine

## 2015-09-10 DIAGNOSIS — Z139 Encounter for screening, unspecified: Secondary | ICD-10-CM

## 2015-09-10 DIAGNOSIS — Z79899 Other long term (current) drug therapy: Secondary | ICD-10-CM

## 2015-09-10 DIAGNOSIS — E785 Hyperlipidemia, unspecified: Secondary | ICD-10-CM

## 2015-09-10 NOTE — Telephone Encounter (Signed)
Patient would lab paper for this week before scheduling appointment to come for wellness.

## 2015-09-10 NOTE — Telephone Encounter (Signed)
bw orders ready. Pt notifed on vm

## 2015-09-10 NOTE — Telephone Encounter (Signed)
Lipo liv glu

## 2015-09-18 LAB — HEPATIC FUNCTION PANEL
ALT: 17 IU/L (ref 0–44)
AST: 23 IU/L (ref 0–40)
Albumin: 4.6 g/dL (ref 3.6–4.8)
Alkaline Phosphatase: 31 IU/L — ABNORMAL LOW (ref 39–117)
Bilirubin Total: 0.7 mg/dL (ref 0.0–1.2)
Bilirubin, Direct: 0.2 mg/dL (ref 0.00–0.40)
TOTAL PROTEIN: 6.8 g/dL (ref 6.0–8.5)

## 2015-09-18 LAB — GLUCOSE, RANDOM: GLUCOSE: 108 mg/dL — AB (ref 65–99)

## 2015-09-18 LAB — LIPID PANEL
CHOL/HDL RATIO: 6.2 ratio — AB (ref 0.0–5.0)
Cholesterol, Total: 303 mg/dL — ABNORMAL HIGH (ref 100–199)
HDL: 49 mg/dL (ref >39)
LDL Calculated: 207 mg/dL — ABNORMAL HIGH (ref 0–99)
Triglycerides: 237 mg/dL — ABNORMAL HIGH (ref 0–149)
VLDL CHOLESTEROL CAL: 47 mg/dL — AB (ref 5–40)

## 2015-09-28 ENCOUNTER — Other Ambulatory Visit: Payer: Self-pay | Admitting: *Deleted

## 2015-09-28 DIAGNOSIS — R0600 Dyspnea, unspecified: Secondary | ICD-10-CM

## 2015-09-28 MED ORDER — VALSARTAN 160 MG PO TABS: 160.0000 mg | ORAL_TABLET | Freq: Every day | ORAL | Status: DC

## 2015-10-01 ENCOUNTER — Encounter: Payer: Self-pay | Admitting: *Deleted

## 2015-10-01 ENCOUNTER — Encounter: Payer: Self-pay | Admitting: Internal Medicine

## 2015-10-01 ENCOUNTER — Telehealth: Payer: Self-pay | Admitting: Internal Medicine

## 2015-10-01 ENCOUNTER — Ambulatory Visit (INDEPENDENT_AMBULATORY_CARE_PROVIDER_SITE_OTHER): Payer: BLUE CROSS/BLUE SHIELD | Admitting: Internal Medicine

## 2015-10-01 VITALS — BP 128/78 | HR 80 | Ht 73.0 in | Wt 212.0 lb

## 2015-10-01 DIAGNOSIS — R0600 Dyspnea, unspecified: Secondary | ICD-10-CM

## 2015-10-01 DIAGNOSIS — J449 Chronic obstructive pulmonary disease, unspecified: Secondary | ICD-10-CM | POA: Diagnosis not present

## 2015-10-01 DIAGNOSIS — I10 Essential (primary) hypertension: Secondary | ICD-10-CM | POA: Diagnosis not present

## 2015-10-01 LAB — PULMONARY FUNCTION TEST
DL/VA % PRED: 78 %
DL/VA: 3.73 ml/min/mmHg/L
DLCO UNC: 23.85 ml/min/mmHg
DLCO unc % pred: 66 %
FEF 25-75 POST: 1.2 L/s
FEF 25-75 Pre: 0.61 L/sec
FEF2575-%Change-Post: 96 %
FEF2575-%PRED-POST: 39 %
FEF2575-%PRED-PRE: 19 %
FEV1-%CHANGE-POST: 29 %
FEV1-%PRED-PRE: 38 %
FEV1-%Pred-Post: 49 %
FEV1-POST: 1.91 L
FEV1-PRE: 1.48 L
FEV1FVC-%Change-Post: 2 %
FEV1FVC-%PRED-PRE: 65 %
FEV6-%Change-Post: 25 %
FEV6-%PRED-POST: 74 %
FEV6-%Pred-Pre: 59 %
FEV6-Post: 3.61 L
FEV6-Pre: 2.88 L
FEV6FVC-%CHANGE-POST: 0 %
FEV6FVC-%PRED-POST: 100 %
FEV6FVC-%Pred-Pre: 101 %
FVC-%Change-Post: 25 %
FVC-%PRED-POST: 73 %
FVC-%PRED-PRE: 58 %
FVC-PRE: 3 L
FVC-Post: 3.78 L
POST FEV1/FVC RATIO: 51 %
PRE FEV1/FVC RATIO: 49 %
PRE FEV6/FVC RATIO: 96 %
Post FEV6/FVC ratio: 96 %

## 2015-10-01 MED ORDER — BUDESONIDE-FORMOTEROL FUMARATE 160-4.5 MCG/ACT IN AERO: INHALATION_SPRAY | RESPIRATORY_TRACT | Status: DC

## 2015-10-01 NOTE — Telephone Encounter (Signed)
error 

## 2015-10-01 NOTE — Progress Notes (Signed)
Subjective:    Patient ID: Dennis [name], male    DOB: 07-01-1951,  MRN: [id number]    Brief patient profile:  84 yowm quit smoking around 1999 cough> sob both better then sev years later worse breathing/fatigue > 20005 much better and able to gym p rehab including aerobics and miniimal respiratory symptoms / rare need for saba at wt around 210 and gradually downhill since then to the point to where out of breath house to mailbox slt incline x 100 ft assoc with audible wheeze   History of Present Illness  08/13/2015 1st [facility] office visit/ Dennis [name]   Chief Complaint  Patient presents with  . Pulmonary Consult    Referred by Dr. [name] for eval of COPD. He states dxed with COPD in 2006. He c/o DOE with minimal exertion such as walking to the mailbox. 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 He has occ cough- prod with clear sputum.  He is using albuterol 1-3 x per day on average.   cough/ sob worse at work, better at home unless go out in heat  - wife hears noisy breathing and can't get to mb and back s stopping to rest. Prev eval by [facility] for same and told he had mild copd and downhill since then despite no smoking in interim rec Stop vasotec  And start avapro 160 mg daily  Pepcid ac 20 mg one at bedtime  GERD  Strongly recommend you discuss the statin issue directly with [name]> never did, doesn't see [name] it turns out      10/01/2015  f/u ov/Dennis [name] re: COPD III with reversibility  Chief Complaint  Patient presents with  . Follow-up    PFT. Pt states that his fatigue level with exertion has increased. Pt states that his DOE has not changed. Pt does have issues with medication side effects, especially the HTN medication.   muscles feel like 50 lbs on each leg w/in sev months of starting vytorin never  Better > says already discussed with Dr [name]  Breathing better p total of 4 puff per day saba but not sustained  Concerned about heart rate  higher on arb than acei Also doesn't think he can return to work as required to climb up steps and inhale fumes   No obvious day to day or daytime variability or assoc chronic cough or cp or chest tightness, subjective wheeze or overt sinus or hb symptoms. No unusual exp hx or h/o childhood pna/ asthma or knowledge of premature birth.  Sleeping ok without nocturnal  or early am exacerbation  of respiratory  c/o's or need for noct saba. Also denies any obvious fluctuation of symptoms with weather or environmental changes or other aggravating or alleviating factors except as outlined above   Current Medications, Allergies, Complete Past Medical History, Past Surgical History, Family History, and Social History were reviewed in [employer] record.  ROS  The following are not active complaints unless bolded sore throat, dysphagia, dental problems, itching, sneezing,  nasal congestion or excess/ purulent secretions, ear ache,   fever, chills, sweats, unintended wt loss, classically pleuritic or exertional cp, hemoptysis,  orthopnea pnd or leg swelling, presyncope, palpitations, abdominal pain, anorexia, nausea, vomiting, diarrhea  or change in bowel or bladder habits, change in stools or urine, dysuria,hematuria,  rash, arthralgias, visual complaints, headache, numbness, weakness or ataxia or problems with walking or coordination,  change in mood/affect or memory.  Objective:   Physical Exam  amb slt obese wm nad but no eye contact / appears depressed/ hopeless   10/01/2015     Wt Readings from Last 3 Encounters:  08/13/15 212 lb 6.4 oz (96.344 kg)  02/26/15 214 lb (97.07 kg)  02/01/15 214 lb 6.4 oz (97.251 kg)    Vital signs reviewed   HEENT: nl dentition, turbinates, and orophanx. Nl external ear canals without cough reflex   NECK :  without JVD/Nodes/TM/ nl carotid upstrokes bilaterally   LUNGS: no acc muscle use, clear to A and P bilaterally without  cough on insp or exp maneuvers   CV:  RRR  no s3 or murmur or increase in P2, no edema   ABD:  soft and nontender with nl excursion in the supine position. No bruits or organomegaly, bowel sounds nl  MS:  warm without deformities, calf tenderness, cyanosis or clubbing  SKIN: warm and dry without lesions    NEURO:  alert, approp, no deficits     CXR PA and Lateral:   08/13/2015 :     I personally reviewed images and agree with radiology impression as follows:   1. Stable chest from 10/25/2014. No acute cardiopulmonary disease. 2. Pleural-parenchymal scarring. COPD cannot be excluded. COPD 3. Prior CABG. Heart size stable.        Assessment & Plan:

## 2015-10-01 NOTE — Assessment & Plan Note (Signed)
-   08/13/2015  Walked RA x 3 laps @ 185 ft each stopped due to  End of study, nl pace, no sob or desat   - trial off vasotec 08/13/2015 > no better 10/01/2015   Now reports what really slows him down is sensation of muscles feeling heavy and week in legs w/in a few months of starting statin but says he was told he couldn't be off them ever, even for two weeks, to sort out the leg problems > Defer to Drs [name] and Domenic Polite

## 2015-10-01 NOTE — Progress Notes (Signed)
PFT done today. 

## 2015-10-01 NOTE — Assessment & Plan Note (Addendum)
PFT's  10/01/2015  FEV1 1.91 (49 % ) ratio 51  p 29 % improvement from saba  With RV/TLC ratio 171% nl with DLCO  66 % corrects to 78 % for alv volume    He has more copd than I appreciated and technically GOLD III = severe airflow obst with air trapping but note not on any maint rx for copd yet and since he is so saba dep rec trial of symbicort 160 2bid and return in 2 weeks to re assess  The proper method of use, as well as anticipated side effects, of a metered-dose inhaler are discussed and demonstrated to the patient. Improved effectiveness after extensive coaching during this visit to a level of approximately  90% from a baseline of < 50%  I had an extended discussion with the patient and wife reviewing all relevant studies completed to date and  lasting 25  minutes of a 40 minute ext  visit  Which included discussion of disability issues and writing him letters today restricting his work exposures.  I don't think he can do a job where he has to repeatedly climb steps or be exposed to any sign fumes/ vapors so have asked him to stay out of work until 11/20/15 and consider applying for full disability in meantime   Each maintenance medication was reviewed in detail including most importantly the difference between maintenance and prns and under what circumstances the prns are to be triggered using an action plan format that is not reflected in the computer generated alphabetically organized AVS.    Please see instructions for details which were reviewed in writing and the patient given a copy highlighting the part that I personally wrote and discussed at today's ov.

## 2015-10-01 NOTE — Patient Instructions (Addendum)
Symbicort 160 Take 2 puffs first thing in am and then another 2 puffs about 12 hours later.   Work on inhaler technique:  relax and gently blow all the way out then take a nice smooth deep breath back in, triggering the inhaler at same time you start breathing in.  Hold for up to 5 seconds if you can. Blow out thru nose. Rinse and gargle with water when done  I will contact Dr Domenic Polite re your concerns about vytorin and your heart rate issues   In my opinion you should not be required to inhale any irritates or fumes / vapors or climb steps step frequently on the basis of your copd which is moderately severe but partially reversible (the reversible part will be hard to control if you don't breath clean air)   Please schedule a follow up office visit in 2 weeks, sooner if needed

## 2015-10-01 NOTE — Assessment & Plan Note (Signed)
Ok off acei but worried about tachycardia and may eventually need BB adjusted but in setting of asthmatic component:  Strongly prefer in this setting: Bystolic, the most beta -1  selective Beta blocker available in sample form, with bisoprolol the most selective generic choice  on the market.   Defer choice of BB to Dr Domenic Polite and [name]

## 2015-10-01 NOTE — Telephone Encounter (Signed)
Error p

## 2015-10-02 ENCOUNTER — Telehealth: Payer: Self-pay | Admitting: Internal Medicine

## 2015-10-02 NOTE — Telephone Encounter (Signed)
STD form completed.  MW signed form.  I faxed it to [place] with [employer] who verified she did received completed fax.  States she will fax to this pt's employer today and will let pt know once completed.  Pt aware.    Form placed in MW's scan folder.

## 2015-10-02 NOTE — Telephone Encounter (Signed)
STD forms received from [place] with [employer] this morning. Form given to Dr. Melvyn Novas to complete today.  Pt aware and is aware I will keep him posted on the progress.

## 2015-10-03 ENCOUNTER — Telehealth: Payer: Self-pay | Admitting: Internal Medicine

## 2015-10-03 DIAGNOSIS — J449 Chronic obstructive pulmonary disease, unspecified: Secondary | ICD-10-CM

## 2015-10-03 MED ORDER — BUDESONIDE-FORMOTEROL FUMARATE 160-4.5 MCG/ACT IN AERO: INHALATION_SPRAY | RESPIRATORY_TRACT | Status: DC

## 2015-10-03 NOTE — Telephone Encounter (Signed)
Called and spoke with pt Pt requesting refill on his symbcort to be sent to [facility] on [address] in [place] pt that refill was sent on 10-01-15 to pharmacy Pt stated that he went by there today and refill was not there Advised pt that i would call pharmacy to verify  Per pharmacy, refill was never received by our office Refill was resubmitted  Pt notified that refill was resent to pharmacy  Nothing further is needed

## 2015-10-10 ENCOUNTER — Encounter: Payer: Self-pay | Admitting: Family Medicine

## 2015-10-10 ENCOUNTER — Encounter: Payer: Self-pay | Admitting: Cardiology

## 2015-10-10 ENCOUNTER — Ambulatory Visit (INDEPENDENT_AMBULATORY_CARE_PROVIDER_SITE_OTHER): Payer: BLUE CROSS/BLUE SHIELD | Admitting: Cardiology

## 2015-10-10 ENCOUNTER — Ambulatory Visit (INDEPENDENT_AMBULATORY_CARE_PROVIDER_SITE_OTHER): Payer: BLUE CROSS/BLUE SHIELD | Admitting: Family Medicine

## 2015-10-10 VITALS — BP 122/82 | Ht 73.0 in | Wt 212.8 lb

## 2015-10-10 VITALS — BP 128/78 | HR 80 | Ht 73.0 in | Wt 215.2 lb

## 2015-10-10 DIAGNOSIS — Z79899 Other long term (current) drug therapy: Secondary | ICD-10-CM

## 2015-10-10 DIAGNOSIS — I483 Typical atrial flutter: Secondary | ICD-10-CM | POA: Diagnosis not present

## 2015-10-10 DIAGNOSIS — E782 Mixed hyperlipidemia: Secondary | ICD-10-CM | POA: Diagnosis not present

## 2015-10-10 DIAGNOSIS — J449 Chronic obstructive pulmonary disease, unspecified: Secondary | ICD-10-CM

## 2015-10-10 DIAGNOSIS — E785 Hyperlipidemia, unspecified: Secondary | ICD-10-CM | POA: Diagnosis not present

## 2015-10-10 DIAGNOSIS — R739 Hyperglycemia, unspecified: Secondary | ICD-10-CM

## 2015-10-10 DIAGNOSIS — I10 Essential (primary) hypertension: Secondary | ICD-10-CM | POA: Diagnosis not present

## 2015-10-10 DIAGNOSIS — I2510 Atherosclerotic heart disease of native coronary artery without angina pectoris: Secondary | ICD-10-CM

## 2015-10-10 LAB — POCT GLYCOSYLATED HEMOGLOBIN (HGB A1C): Hemoglobin A1C: 5.3

## 2015-10-10 MED ORDER — ROSUVASTATIN CALCIUM 20 MG PO TABS: ORAL_TABLET | ORAL | Status: DC

## 2015-10-10 NOTE — Patient Instructions (Signed)
Medication Instructions:  DISCONTINUE VYTORIN START CRESTOR 20 MG IN THE EVENING ** FOR THE FIRST MONTH TAKE 1/2 TABLET (10 MG) DAILY  IN THE EVENING TO SEE IF YOU TOLERATE WELL THEN AFTER FIRST MONTH TAKE THE WHOLE PILL (20 MG) DAILY IN THE EVENING  Labwork: Your physician recommends that you return for lab work in: [place] NEXT VISIT LTF'S  LIPID   Testing/Procedures: NONE  Follow-Up: Your physician recommends that you schedule a follow-up appointment in: 2 [place]   Any Other Special Instructions Will Be Listed Below (If Applicable).     If you need a refill on your cardiac medications before your next appointment, please call your pharmacy.

## 2015-10-10 NOTE — Progress Notes (Signed)
   Subjective:    Patient ID: Dennis [name], male    DOB: [birth year], 64 y.o.   MRN: [id number]  Hyperlipidemia This is a chronic problem. The current episode started more than 1 year ago. Treatments tried: fenofibrate. There are no compliance problems.    Discuss recent labs and visit to pulmonology   Review of Systems     Objective:   Physical Exam        Assessment & Plan:

## 2015-10-10 NOTE — Progress Notes (Signed)
   Subjective:    Patient ID: Dennis [name], male    DOB: February 03, 1951, 64 y.o.   MRN: [id number] Patient arrives office with numerous concerns   HPI Sugars have been elevated at times. A1c was up to 6.3% watching sugar intake trying to cut down Results for orders placed or performed in visit on 10/10/15  POCT glycosylated hemoglobin (Hb A1C)  Result Value Ref Range   Hemoglobin A1C 5.3     Compliant with lipid medicine. Difficulty side effects with higher doses in the past. Does not miss a dose work on fat intake. Medications reviewed today. Next  Blood pressure medicines have been changed. Patient concerned about this. Wonders if it is appropriate. Meds reviewed. Question erectile dysfunction with current medications.  Ongoing difficulties or shortness of breath. Workup has revealed COPD. Now on Symbicort. Reports it is helping some.  Patient states completely disabled unable to do his job at this point with all his many medical concerns. Currently his pulmonary doctor has taken him out of work Review of Systems No current headache no chest pain no abdominal pain no change in bowel habits ROS otherwise negative    Objective:   Physical Exam  Alert vitals stable HEENT normal. Lungs clear diminished breath sounds diffusely. Heart regular in rhythm.     all labs reviewed Assessment & Plan:  Impression 1 COPD progressive discussed meds discussed #2 hypertension good control medications reviewed discussed #3 hyperlipidemia suboptimal control but patient does meds reviewed #4 erectile dysfunction you challenge for 5 disability discussed once again plan easily 25 minutes spent most in discussion maintain same medications recheck in 6 weeks diet exercise discussed WSL

## 2015-10-10 NOTE — Progress Notes (Signed)
Cardiology Office Note  Date: 10/10/2015   ID: Dennis [name], DOB 1951-09-28, MRN [id number]  PCP: Mickie Hillier, MD  Primary Cardiologist: Rozann Lesches, MD   Chief Complaint  Patient presents with  . Coronary Artery Disease  . Atrial Flutter    History of Present Illness: Dennis [name] is a 64 y.o. male last seen in March. He presents today for a routine cardiac visit with his wife. Recent visit with Dr. Melvyn Novas noted for management of severe COPD. Patient is on short-term disability at this time as recommended by Dr. Melvyn Novas, likely to file for full disability. He reports chronic shortness of breath, recently somewhat better after adjustment in his medication including the addition of Symbicort. There has also been some discussion about the possibility of changing his metoprolol to either Bystolic or bisoprolol to see if this might also help to improve his respiratory status. We discussed this possibility as well.  He does not report any angina symptoms or nitroglycerin requirement. He is also not bothered by any significant palpitations. He has noticed weakness and fatigue in his legs, has raised the question as to whether this might be related to Vytorin. He has a history of significant hyperlipidemia, recent numbers are reviewed below. Although he has tolerated Vytorin long-term, he has never really had very optimal LDL control based on chart review. Today we talked about the possibility of trying Crestor instead to see if this might provide better lipid management and fewer side effects. He was in agreement to make this change today. I also talked with him about a referral to the lipid clinic to consider other treatment options such as PSCK9 inhibitors, but he did not want to pursue this at this time.  As noted previously, he has persistent rate-controlled atrial flutter that is not overly symptomatic. We have talked about the possibility of an elective cardioversion over time, although  I am not certain to what degree this would help him with any of his present symptoms. He would also need to be on anticoagulation, and he has generally declined so far. CHADSVASC score is 2. Annual risk of stroke on aspirin is approximately 2.3% versus 0.8% were he to take an anticoagulant such as Eliquis. This is a conversation we continue to have.  In talking with [name] today, he really seems to be very conflicted about making changes in his medications or pursuing other treatment strategies. We both agreed that a trial of Crestor instead of Vytorin was reasonable, and if we made this transition and he tolerated it, we might consider other changes in his medications.   Past Medical History  Diagnosis Date  . Essential hypertension, benign   . Hyperlipidemia   . COPD (chronic obstructive pulmonary disease) ([place])   . Coronary atherosclerosis of native coronary artery     Multivessel status post CABG  . Insomnia   . Atrial flutter (Dennis [name])     Diagnosed by ECG August 2015  . Colitis     Past Surgical History  Procedure Laterality Date  . Coronary artery bypass graft  2005    Dr. Lucianne Lei Trigt: LIMA to LAD, right radial to circumflex, SVG to RCA  . Colonoscopy with propofol N/A 08/30/2015    Procedure: COLONOSCOPY WITH PROPOFOL at cecum at 0814; withdrawal time=8 minutes;  Surgeon: Dennis Dolin, MD;  Location: [facility];  Service: Endoscopy;  Laterality: N/A;    Current Outpatient Prescriptions  Medication Sig Dispense Refill  . albuterol (PROVENTIL HFA;VENTOLIN HFA)  108 (90 BASE) MCG/ACT inhaler Inhale 2 puffs into the lungs every 4 (four) hours as needed for wheezing. 3 Inhaler 1  . aspirin 81 MG tablet Take 81 mg by mouth every other day.     . budesonide-formoterol (SYMBICORT) 160-4.5 MCG/ACT inhaler Take 2 puffs first thing in am and then another 2 puffs about 12 hours later. 1 Inhaler 2  . fenofibrate 160 MG tablet TAKE 1 TABLET BY MOUTH EVERY DAY 90 tablet 0  . fish oil-omega-3 fatty  acids 1000 MG capsule Take 2 g by mouth daily.    Marland Kitchen ketoconazole (NIZORAL) 2 % cream Apply 1 application topically 2 (two) times daily as needed for irritation. 30 g 0  . metoprolol (LOPRESSOR) 50 MG tablet TAKE 1 TABLET BY MOUTH TWICE A DAY 180 tablet 0  . polyethylene glycol-electrolytes (NULYTELY/GOLYTELY) 420 G solution Take 4,000 mLs by mouth once. 4000 mL 0  . valsartan (DIOVAN) 160 MG tablet Take 1 tablet (160 mg total) by mouth daily. 90 tablet 1  . zolpidem (AMBIEN) 10 MG tablet TAKE 1 TABLET BY MOUTH AT BEDTIME AS NEEDED FOR SLEEP 90 tablet 1  . rosuvastatin (CRESTOR) 20 MG tablet For the first month only take 1/2 tablet daily (10 mg) in the evening After first month take the whole tablet (20 mg ) in the evening 90 tablet 3  . rosuvastatin (CRESTOR) 20 MG tablet For the first month only take 1/2 tablet daily (10 mg) in the evening After first month take the whole tablet (20 mg ) in the evening 90 tablet 3   No current facility-administered medications for this visit.    Allergies:  Ace inhibitors   Social History: The patient  reports that he quit smoking about 17 years ago. His smoking use included Cigarettes. He started smoking about 44 years ago. He has a 54 pack-year smoking history. His smokeless tobacco use includes Chew. He reports that he drinks alcohol. He reports that he does not use illicit drugs.   ROS:  Please see the history of present illness. Otherwise, complete review of systems is positive for NYHA class 2-3 dyspnea.  All other systems are reviewed and negative.   Physical Exam: VS:  BP 128/78 mmHg  Pulse 80  Ht 6\' 1"  (1.854 m)  Wt 215 lb 3.2 oz (97.614 kg)  BMI 28.40 kg/m2  SpO2 96%, BMI Body mass index is 28.4 kg/(m^2).  Wt Readings from Last 3 Encounters:  10/10/15 215 lb 3.2 oz (97.614 kg)  10/10/15 212 lb 12.8 oz (96.525 kg)  10/01/15 212 lb (96.163 kg)     Patient is in no acute distress. HEENT: Conjunctiva and lids normal, oropharynx clear. Neck:  Supple, no elevated JVP or carotid bruits, no thyromegaly. Lungs: Decreased breath sounds throughout without wheezing, nonlabored breathing at rest. Cardiac: Irregular rate and rhythm, distant heart sounds, no S3 or significant systolic murmur, no pericardial rub. Abdomen: Soft, nontender, bowel sounds present, no guarding or rebound. Extremities: Trace ankle edema, distal pulses 2+. Skin: Warm and dry. Musculoskeletal: No kyphosis. Neuropsychiatric: Alert and oriented x3, affect grossly appropriate.   ECG: Tracing from 08/23/2015 showed rate-controlled typical atrial flutter with variable block.  Recent Labwork: 08/13/2015: Pro B Natriuretic peptide (BNP) 126.0*; TSH 1.83 08/23/2015: BUN 26*; Creatinine, Ser 1.29*; Hemoglobin 15.0; Platelets 163; Potassium 5.1; Sodium 134* 09/17/2015: ALT 17; AST 23     Component Value Date/Time   CHOL 303* 09/17/2015 0810   CHOL 254* 08/25/2014 0922   TRIG 237* 09/17/2015 0810  HDL 49 09/17/2015 0810   HDL 45 08/25/2014 0922   CHOLHDL 6.2* 09/17/2015 0810   CHOLHDL 5.6 08/25/2014 0922   VLDL 35 08/25/2014 0922   LDLCALC 207* 09/17/2015 0810   LDLCALC 174* 08/25/2014 0922    Other Studies Reviewed Today:  [facility] 02/21/2014: EXAM: MYOCARDIAL IMAGING WITH SPECT (REST AND PHARMACOLOGIC-STRESS)  GATED LEFT VENTRICULAR WALL MOTION STUDY  LEFT VENTRICULAR EJECTION FRACTION  TECHNIQUE: Standard myocardial SPECT imaging was performed after resting intravenous injection of 10 mCi Tc-43m sestamibi. Subsequently, intravenous infusion of Lexiscan was performed under the supervision of the Cardiology staff. At peak effect of the drug, 30 mCi Tc-53m sestamibi was injected intravenously and standard myocardial SPECT imaging was performed. Quantitative gated imaging was also performed to evaluate left ventricular wall motion, and estimate left ventricular ejection fraction.  COMPARISON: None.  FINDINGS: FINDINGS Baseline EKG  showed atrial fibrillation with right bundle branch block. After injection heart rate increased some 79 beats per min up to 112 beats per min and blood pressure decreased from 127/85 down to 118/79. The test was stopped after injection was complete, the patient did not experience any chest pain. Post-injection EKG showed no specific ischemic changes, the patient remained in atrial fibrillation with no new or arrhythmias.  Myocardial perfusion imaging  Raw images showed significant radiotracer uptake in the gut. There was a moderate sized mild intensity inferior and inferoapical wall defect seen in the pre-injection images. This same defect was less intense in the post-injection images. The inferior wall had normal wall motion. Overall findings are consistent with sub- diaphragmatic attenuation. There were no other myocardial perfusion defects.  End-diastolic volume 123456 mL, end systolic volume 58 mL, left ventricular ejection fraction 51%. Septal motion was consistent with bundle branch block.  Post-injection EKG showed no specific ischemic changes and no significant arrhythmias.  IMPRESSION:  1. No reversible ischemia or infarction.  2. Normal left ventricular wall motion, pattern consistent with bundle branch block.  3. Left ventricular ejection fraction 51%  4. Low-risk stress test findings*.  Echocardiogram 07/24/2014: Study Conclusions  - Left ventricle: The cavity size was normal. Wall thickness was increased increased in a pattern of mild to moderate LVH. Systolic function was normal. The estimated ejection fraction was in the range of 50% to 55%. - Regional wall motion abnormality: Mild hypokinesis of the basal-mid anteroseptal myocardium. - Aortic valve: Mildly to moderately calcified annulus. Moderately thickened leaflets. - Aorta: Visualized portions of the proximal ascending aorta are normal in size, 3.3 cm. Aortic root dimension: 42 mm  (ED). - Aortic root: The aortic root was mildly dilated. - Mitral valve: Mildly calcified annulus. Mildly thickened leaflets . There was mild regurgitation. - Left atrium: The atrium was mildly dilated. - Atrial septum: No defect or patent foramen ovale was identified. - Systemic veins: IVC is dilated with normal respriatory response, estimated RA pressure 43mmHg. - Technically adequate study.   ASSESSMENT AND PLAN:  1. CAD status post CABG in 2005 with low risk stress test documented in March 2015. Does not endorse any angina symptoms at this time, and we will continue observation.  2. Persistent, rate-controlled atrial flutter of uncertain duration, not overly symptomatic from the perspective of palpitations. I have talked with him several times about the possibility of considering anticoagulation for stroke prophylaxis and potential elective cardioversion (even ablation), although it is not entirely clear how much of an improvement in symptoms he would notice since he does have other reasons for shortness of breath. We continue  this discussion, and at least for now he prefers to stay on aspirin and hold off on other procedures.  3. Hyperlipidemia with question of side effects related to Vytorin. Recent lipid panel showed LDL 207. He reports compliance with his medication. As noted above, we will try to initiate Crestor starting at 10 mg daily and going to 20 mg daily if he tolerates. He will observe for any significant side effects and we will plan to follow-up with an FLP and LFT in the next 2 months with clinical visit. I did offer him referral to the lipid clinic, but he does not want to pursue this at this time.  4. COPD, managed by Dr. Melvyn Novas.  Current medicines were reviewed at length with the patient today.   Orders Placed This Encounter  Procedures  . Hepatic function panel  . Cholesterol, Total    Disposition: FU with me in 2 months.   Signed, Satira Sark, MD,  [facility] 10/10/2015 12:06 PM    [facility]. [address], [place], [place] [address] Phone: [phone]; Fax: [phone]

## 2015-10-22 ENCOUNTER — Telehealth: Payer: Self-pay | Admitting: Family Medicine

## 2015-10-22 NOTE — Telephone Encounter (Signed)
Pt is wanting some antibiotics called in for him, he has picked up his wife's  Cough, chest cold that you issued her antibiotics for   Advised he will most likely need an appt but would send a message per patients  Request  [facility]

## 2015-10-22 NOTE — Telephone Encounter (Signed)
Pt having cough, congestion and wheezing for the past 5 days. Using inhaler that lung specialist prescribed. Offered pt appt today. Pt declined. Explained to pt that antibiotics cannot be called in without being seen and that he should be seen today since he is wheezing. Pt still declined appt. He states he sees his lung specialist this week and doesn't want to come in at this time. Advised pt to call back if worse and we can get him an appt sooner if he wishes.

## 2015-10-25 ENCOUNTER — Ambulatory Visit: Payer: BLUE CROSS/BLUE SHIELD | Admitting: Internal Medicine

## 2015-10-25 ENCOUNTER — Ambulatory Visit (INDEPENDENT_AMBULATORY_CARE_PROVIDER_SITE_OTHER): Payer: BLUE CROSS/BLUE SHIELD | Admitting: Internal Medicine

## 2015-10-25 ENCOUNTER — Encounter: Payer: Self-pay | Admitting: Internal Medicine

## 2015-10-25 VITALS — BP 130/80 | HR 87 | Ht 73.0 in | Wt 216.2 lb

## 2015-10-25 DIAGNOSIS — R058 Other specified cough: Secondary | ICD-10-CM

## 2015-10-25 DIAGNOSIS — J449 Chronic obstructive pulmonary disease, unspecified: Secondary | ICD-10-CM | POA: Diagnosis not present

## 2015-10-25 DIAGNOSIS — R05 Cough: Secondary | ICD-10-CM | POA: Diagnosis not present

## 2015-10-25 MED ORDER — MOMETASONE FURO-FORMOTEROL FUM 200-5 MCG/ACT IN AERO: INHALATION_SPRAY | RESPIRATORY_TRACT | Status: DC

## 2015-10-25 MED ORDER — TIOTROPIUM BROMIDE MONOHYDRATE 2.5 MCG/ACT IN AERS: INHALATION_SPRAY | RESPIRATORY_TRACT | Status: DC

## 2015-10-25 MED ORDER — AZITHROMYCIN 250 MG PO TABS: ORAL_TABLET | ORAL | Status: DC

## 2015-10-25 NOTE — Progress Notes (Signed)
Subjective:    Patient ID: Dennis [name], male    DOB: [birth year],  MRN: [id number]    Brief patient profile:  52 yowm quit smoking around 1999 cough> sob both better then sev years later worse breathing/fatigue > 20005 much better and able to gym p rehab including aerobics and miniimal respiratory symptoms / rare need for saba at wt around 210 and gradually downhill since then to the point to where out of breath house to mailbox slt incline x 100 ft assoc with audible wheeze   History of Present Illness  08/13/2015 1st [facility] office visit/ Isolde Skaff   Chief Complaint  Patient presents with  . Pulmonary Consult    Referred by Dr. [name] for eval of COPD. He states dxed with COPD in 2006. He c/o DOE with minimal exertion such as walking to the mailbox. 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 He has occ cough- prod with clear sputum.  He is using albuterol 1-3 x per day on average.   cough/ sob worse at work, better at home unless go out in heat  - wife hears noisy breathing and can't get to mb and back s stopping to rest. Prev eval by [facility] for same and told he had mild copd and downhill since then despite no smoking in interim rec Stop vasotec  And start avapro 160 mg daily  Pepcid ac 20 mg one at bedtime  GERD  Strongly recommend you discuss the statin issue directly with [name]> never did, doesn't see [name] it turns out      10/01/2015  f/u ov/Jodel Mayhall re: COPD III with reversibility  Chief Complaint  Patient presents with  . Follow-up    PFT. Pt states that his fatigue level with exertion has increased. Pt states that his DOE has not changed. Pt does have issues with medication side effects, especially the HTN medication.   muscles feel like 50 lbs on each leg w/in sev months of starting vytorin never  Better > says already discussed with Dr [name]  Breathing better p total of 4 puff per day saba but not sustained  Concerned about heart rate  higher on arb than acei Also doesn't think he can return to work as required to climb up steps and inhale fumes rec Symbicort 160 Take 2 puffs first thing in am and then another 2 puffs about 12 hours later.  Work on inhaler technique:    In my opinion you should not be required to inhale any irritates or fumes / vapors or climb steps step frequently on the basis of your copd which is moderately severe but partially reversible (the reversible part will be hard to control if you don't breath clean air)    10/25/2015  f/u ov/Ayeisha Lindenberger re: copd III with reversibility on maint rx with symbicort 160 2bid   Chief Complaint  Patient presents with  . Follow-up    Pt c/o cough with clear mucus for about 6 days and increased SOB/wheeze. Pt did use albuterol via nebulizer once with some improvement. Pt does report some improvement on Symbicort, however his insurance does not cover it. Pt is also reporting unwanted side-effects from Diovan.   Walking at even a mall ok but can't do any steps s sob  / ok mailbox and back ok now  Acutely worse p caught "head cold" x 4 days prior to OV     No obvious day to day or daytime variability  or assoc cp or chest tightness, subjective wheeze or overt sinus or hb symptoms. No unusual exp hx or h/o childhood pna/ asthma or knowledge of premature birth.  Sleeping ok without nocturnal  or early am exacerbation  of respiratory  c/o's or need for noct saba. Also denies any obvious fluctuation of symptoms with weather or environmental changes or other aggravating or alleviating factors except as outlined above   Current Medications, Allergies, Complete Past Medical History, Past Surgical History, Family History, and Social History were reviewed in [employer] record.  ROS  The following are not active complaints unless bolded sore throat, dysphagia, dental problems, itching, sneezing,  nasal congestion or excess/ purulent secretions, ear ache,   fever,  chills, sweats, unintended wt loss, classically pleuritic or exertional cp, hemoptysis,  orthopnea pnd or leg swelling, presyncope, palpitations, abdominal pain, anorexia, nausea, vomiting, diarrhea  or change in bowel or bladder habits, change in stools or urine, dysuria,hematuria,  rash, arthralgias, visual complaints, headache, numbness, weakness or ataxia or problems with walking or coordination,  change in mood/affect or memory.          Objective:   Physical Exam  amb slt obese wm nad seems less depressed, more hopeful today      10/25/2015         216       08/13/15 212 lb 6.4 oz (96.344 kg)  02/26/15 214 lb (97.07 kg)  02/01/15 214 lb 6.4 oz (97.251 kg)    Vital signs reviewed   HEENT: nl dentition, turbinates, and orophanx. Nl external ear canals without cough reflex   NECK :  without JVD/Nodes/TM/ nl carotid upstrokes bilaterally   LUNGS: no acc muscle use, clear to A and P bilaterally without cough on insp or exp maneuvers   CV:  RRR  no s3 or murmur or increase in P2, no edema   ABD:  soft and nontender with nl excursion in the supine position. No bruits or organomegaly, bowel sounds nl  MS:  warm without deformities, calf tenderness, cyanosis or clubbing  SKIN: warm and dry without lesions    NEURO:  alert, approp, no deficits     CXR PA and Lateral:   08/13/2015 :     I personally reviewed images and agree with radiology impression as follows:   1. Stable chest from 10/25/2014. No acute cardiopulmonary disease. 2. Pleural-parenchymal scarring. COPD cannot be excluded. COPD 3. Prior CABG. Heart size stable.        Assessment & Plan:

## 2015-10-25 NOTE — Patient Instructions (Addendum)
zpak   For cough >>  mucinex dm 1200 mg every 12 hours  Continue symbicort =  dulera 200 Take 2 puffs first thing in am and then another 2 puffs about 12 hours later > call me if not improving Add spiriva 2 pffs each am   Only use your albuterol as a rescue medication to be used if you can't catch your breath by resting or doing a relaxed purse lip breathing pattern.  - The less you use it, the better it will work when you need it. - Ok to use up to 2 puffs  every 4 hours if you must but call for immediate appointment if use goes up over your usual need - Don't leave home without it !!  (think of it like the spare tire for your car)   Please schedule a follow up office visit in 6 weeks, call sooner if needed - we need to get you to pulmonary rehab at [facility]

## 2015-10-26 ENCOUNTER — Ambulatory Visit: Payer: BLUE CROSS/BLUE SHIELD | Admitting: Internal Medicine

## 2015-10-31 ENCOUNTER — Encounter: Payer: Self-pay | Admitting: Internal Medicine

## 2015-10-31 NOTE — Assessment & Plan Note (Addendum)
-   08/13/2015  Walked RA x 3 laps @ 185 ft each stopped due to  End of study, nl pace, no sob or desat   - PFT's  10/01/2015  FEV1 1.91 (49 % ) ratio 51  p 29 % improvement from saba  With RV/TLC ratio 171% nl with DLCO  66 % corrects to 78 % for alv volume   - 10/01/2015    trial of symbicort 160 2 bid  - 10/25/2015  extensive coaching HFA effectiveness =    90% with respimat > try adding spiriva respimat   Continues to have symptoms disporportionate to fev1 so could have sign dynamic hyperinflation esp given high rv/ TLC ratio at baseline and could really use rehab if/ when he's ready to commit to it   I had an extended discussion with the patient reviewing all relevant studies completed to date and  lasting 15 to 20 minutes of a 25 minute visit    Each maintenance medication was reviewed in detail including most importantly the difference between maintenance and prns and under what circumstances the prns are to be triggered using an action plan format that is not reflected in the computer generated alphabetically organized AVS.    Please see instructions for details which were reviewed in writing and the patient given a copy highlighting the part that I personally wrote and discussed at today's ov.

## 2015-10-31 NOTE — Assessment & Plan Note (Signed)
Trial off acei 08/13/2015 >  Improved 10/25/2015   He is having a mild flare with uri though overall much better now than in sept 2016 so rec maintain off acei and rx acutely with zpak/ mucinex dm prn

## 2015-11-05 ENCOUNTER — Other Ambulatory Visit: Payer: Self-pay | Admitting: Family Medicine

## 2015-11-13 ENCOUNTER — Ambulatory Visit (INDEPENDENT_AMBULATORY_CARE_PROVIDER_SITE_OTHER): Payer: BLUE CROSS/BLUE SHIELD | Admitting: Family Medicine

## 2015-11-13 ENCOUNTER — Telehealth: Payer: Self-pay | Admitting: Family Medicine

## 2015-11-13 ENCOUNTER — Encounter: Payer: Self-pay | Admitting: Family Medicine

## 2015-11-13 VITALS — BP 118/82 | Ht 73.0 in | Wt 215.0 lb

## 2015-11-13 DIAGNOSIS — E785 Hyperlipidemia, unspecified: Secondary | ICD-10-CM | POA: Diagnosis not present

## 2015-11-13 DIAGNOSIS — R0600 Dyspnea, unspecified: Secondary | ICD-10-CM | POA: Diagnosis not present

## 2015-11-13 DIAGNOSIS — I10 Essential (primary) hypertension: Secondary | ICD-10-CM | POA: Diagnosis not present

## 2015-11-13 DIAGNOSIS — I483 Typical atrial flutter: Secondary | ICD-10-CM | POA: Diagnosis not present

## 2015-11-13 DIAGNOSIS — Z79899 Other long term (current) drug therapy: Secondary | ICD-10-CM

## 2015-11-13 NOTE — Telephone Encounter (Signed)
Pt would like to do his bw before his next appt on 3/13 if you would go ahead  An order it, he would like for his cholesterol to be checked at that time as well   Call when sent

## 2015-11-13 NOTE — Telephone Encounter (Signed)
Left message on voicemail notifying patient that blood work has been ordered.  

## 2015-11-13 NOTE — Progress Notes (Signed)
   Subjective:    Patient ID: HAM CONK, male    DOB: [birth year], 63 y.o.   MRN: [id number]  HPIpt arrives for a follow up.  Wants to discuss being taken out of work. Patient is been out of work for 3 months. Severe COPD. Substantial shortness of breath with any type of exertion.  Has atrial flutter. This is leading to further diminishment of energy and substantial exercise intolerance. They brought up the idea potential cardioversion patient reluctant    Patient has ongoing difficulty with insomnia per uses Ambien 4.  Compliant with blood pressure medication. No obvious side effects medications reviewed today does not miss a dose. Next   Wants to have bw done for lipid and pt requesting an appt this month after bw is done and wants to cancel appt with cardiologist.  Recently switched to Crestor from Vytorin     Review of Systems Positive shortness breath positive fatigue mild headache diminished energy    Objective:   Physical Exam Alert vitals stable no acute distress HEENT normal blood pressure good on repeat heart irregular rhythm but controlled rate lungs diminished breath sounds diffusely rare wheeze no tachypnea ankles no edema       Assessment & Plan:  Impression 1 COPD with substantial symptomatology and debilitating discussed #2 atrial flutter adding to fatigue #3 hypertension good control #4 hyperlipidemia status uncertain plan easily 25 minutes spent most in discussion. Patient now disabled unable to work because of multiple medical reasons as noted maintain same meds diet exercise discussed recheck her several months blood work before then [employer]

## 2015-11-13 NOTE — Telephone Encounter (Signed)
Lip liv m7 

## 2015-11-20 ENCOUNTER — Ambulatory Visit: Payer: BLUE CROSS/BLUE SHIELD | Admitting: Adult Health

## 2015-11-29 LAB — HEPATIC FUNCTION PANEL
ALK PHOS: 23 U/L — AB (ref 40–115)
ALT: 19 U/L (ref 9–46)
AST: 27 U/L (ref 10–35)
Albumin: 4.3 g/dL (ref 3.6–5.1)
BILIRUBIN DIRECT: 0.1 mg/dL (ref <=0.2)
BILIRUBIN TOTAL: 0.7 mg/dL (ref 0.2–1.2)
Indirect Bilirubin: 0.6 mg/dL (ref 0.2–1.2)
Total Protein: 6.8 g/dL (ref 6.1–8.1)

## 2015-11-29 LAB — CHOLESTEROL, TOTAL: Cholesterol: 295 mg/dL — ABNORMAL HIGH (ref 125–200)

## 2015-12-06 ENCOUNTER — Telehealth: Payer: Self-pay | Admitting: Internal Medicine

## 2015-12-06 NOTE — Telephone Encounter (Signed)
Spoke with pt's wife, wanted to know if pt needed to keep appt tomorrow with "his heart stuff" going on.  I advised pt's wife that the follow up is for the benefit of the pt and if his health concerns have changed then it might be more beneficial to discuss it in person with MW instead of over the phone.  Pt's wife states they will keep appt at this time.  Nothing further needed.

## 2015-12-07 ENCOUNTER — Encounter: Payer: Self-pay | Admitting: Internal Medicine

## 2015-12-07 ENCOUNTER — Ambulatory Visit (INDEPENDENT_AMBULATORY_CARE_PROVIDER_SITE_OTHER): Payer: BLUE CROSS/BLUE SHIELD | Admitting: Internal Medicine

## 2015-12-07 VITALS — BP 118/80 | HR 91 | Ht 73.0 in | Wt 220.8 lb

## 2015-12-07 DIAGNOSIS — J449 Chronic obstructive pulmonary disease, unspecified: Secondary | ICD-10-CM

## 2015-12-07 NOTE — Assessment & Plan Note (Signed)
-   08/13/2015  Walked RA x 3 laps @ 185 ft each stopped due to  End of study, nl pace, no sob or desat   - PFT's  10/01/2015  FEV1 1.91 (49 % ) ratio 51  p 29 % improvement from saba  With RV/TLC ratio 171% nl with DLCO  66 % corrects to 78 % for alv volume   - 10/01/2015    trial of symbicort 160 2 bid  - 10/25/2015  extensive coaching HFA effectiveness =    90% with respimat > added spiriva respimat  - 12/07/2015   Walked RA  2 laps @ 185 ft each stopped due to  Legs weak, min sob/ no desat at brisk pace > referred to rehab at [facility]   DDX of  difficult airways management almost all start with A and  include Adherence, Ace Inhibitors, Acid Reflux, Active Sinus Disease, Alpha 1 Antitripsin deficiency, Anxiety masquerading as Airways dz,  ABPA,  Allergy(esp in young), Aspiration (esp in elderly), Adverse effects of meds,  Active smokers, A bunch of PE's (a small clot burden can't cause this syndrome unless there is already severe underlying pulm or vascular dz with poor reserve) plus two Bs  = Bronchiectasis and Beta blocker use..and one C= CHF   Adherence is always the initial "prime suspect" and is a multilayered concern that requires a "trust but verify" approach in every patient - starting with knowing how to use medications, especially inhalers, correctly, keeping up with refills and understanding the fundamental difference between maintenance and prns vs those medications only taken for a very short course and then stopped and not refilled.  - - The proper method of use, as well as anticipated side effects, of a metered-dose inhaler are discussed and demonstrated to the patient. Improved effectiveness after extensive coaching during this visit to a level of approximately 90 % from a baseline of 75 %   Adverse effects of meds > unexplained calf pain/ weakness is statin related until proven otherwise but could be conditioning > referred to rehab and will request trial off statin x 2 weeks   ? Anxiety  /depression> usually at the bottom of this list of usual suspects but should be much higher on this pt's based on H and P   I had an extended discussion with the patient reviewing all relevant studies completed to date and  lasting 15 to 20 minutes of a 25 minute visit    Each maintenance medication was reviewed in detail including most importantly the difference between maintenance and prns and under what circumstances the prns are to be triggered using an action plan format that is not reflected in the computer generated alphabetically organized AVS.    Please see instructions for details which were reviewed in writing and the patient given a copy highlighting the part that I personally wrote and discussed at today's ov.

## 2015-12-07 NOTE — Patient Instructions (Addendum)
Please see patient coordinator before you leave today  to schedule pulmonary rehab at [facility]  Remember to use the symbicort and spiriva first thing in am   I very strongly recommend you be given a trial off of your cholesterol pill for a minimum of 2 weeks to see what if any effect this has on your calf pains and leg weakness and that this be further evaluated if the reason for it is not the cholesterol medication   Please schedule a follow up visit in 3 months but call sooner if needed

## 2015-12-07 NOTE — Progress Notes (Signed)
Subjective:    Patient ID: Dennis [name], male    DOB: 1951/01/31,  MRN: [id number]    Brief patient profile:  34 yowm quit smoking around 1999 cough> sob both better then sev years later worse breathing/fatigue > 20005 much better and able to gym p rehab including aerobics and miniimal respiratory symptoms / rare need for saba at wt around 210 and gradually downhill since then to the point to where out of breath house to mailbox slt incline x 100 ft assoc with audible wheeze   History of Present Illness  08/13/2015 1st  Pulmonary office visit/ Dennis [name]   Chief Complaint  Patient presents with  . Pulmonary Consult    Referred by Dr. [name] for eval of COPD. He states dxed with COPD in 2006. He c/o DOE with minimal exertion such as walking to the mailbox. 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 He has occ cough- prod with clear sputum.  He is using albuterol 1-3 x per day on average.   cough/ sob worse at work, better at home unless go out in heat  - wife hears noisy breathing and can't get to mb and back s stopping to rest. Prev eval by [facility] for same and told he had mild copd and downhill since then despite no smoking in interim rec Stop vasotec  And start avapro 160 mg daily  Pepcid ac 20 mg one at bedtime  GERD  Strongly recommend you discuss the statin issue directly with [name]> never did, doesn't see [name] it turns out      10/01/2015  f/u ov/Dennis [name] re: COPD III with reversibility  Chief Complaint  Patient presents with  . Follow-up    PFT. Pt states that his fatigue level with exertion has increased. Pt states that his DOE has not changed. Pt does have issues with medication side effects, especially the HTN medication.   muscles feel like 50 lbs on each leg w/in sev months of starting vytorin never  Better > says already discussed with Dr [name]  Breathing better p total of 4 puff per day saba but not sustained  Concerned about heart rate  higher on arb than acei Also doesn't think he can return to work as required to climb up steps and inhale fumes rec Symbicort 160 Take 2 puffs first thing in am and then another 2 puffs about 12 hours later.  Work on inhaler technique:    In my opinion you should not be required to inhale any irritates or fumes / vapors or climb steps step frequently on the basis of your copd which is moderately severe but partially reversible (the reversible part will be hard to control if you don't breath clean air)    10/25/2015  f/u ov/Dennis [name] re: copd III with reversibility on maint rx with symbicort 160 2bid   Chief Complaint  Patient presents with  . Follow-up    Pt c/o cough with clear mucus for about 6 days and increased SOB/wheeze. Pt did use albuterol via nebulizer once with some improvement. Pt does report some improvement on Symbicort, however his insurance does not cover it. Pt is also reporting unwanted side-effects from Diovan.   Walking at even a mall ok but can't do any steps s sob  / ok mailbox and back ok now  Acutely worse p caught "head cold" x 4 days prior to OV  rec zpak  For cough >>  mucinex dm 1200 mg  every 12 hours Continue symbicort =  dulera 200 Take 2 puffs first thing in am and then another 2 puffs about 12 hours later > call me if not improving Add spiriva 2 pffs each am  Only use your albuterol as a rescue medication as a rescue    12/07/2015  f/u ov/Dennis [name] re: GOLD II copd / spiriva respimat/symb and prn saba  Chief Complaint  Patient presents with  . Follow-up    Pt states his cough has resolved. He states that his breathing is no better. Has no energy.   did not use saba before maint rx which hasn't yet taken today  Cough gone to his satisfaction  Main limit is bilateral calf pain/weak x across a parking lot     No obvious day to day or daytime variability or assoc excess/ purulent sputum or mucus plugs   cp or chest tightness, subjective wheeze or overt sinus or hb  symptoms. No unusual exp hx or h/o childhood pna/ asthma or knowledge of premature birth.  Sleeping ok without nocturnal  or early am exacerbation  of respiratory  c/o's or need for noct saba. Also denies any obvious fluctuation of symptoms with weather or environmental changes or other aggravating or alleviating factors except as outlined above   Current Medications, Allergies, Complete Past Medical History, Past Surgical History, Family History, and Social History were reviewed in [employer] record.  ROS  The following are not active complaints unless bolded sore throat, dysphagia, dental problems, itching, sneezing,  nasal congestion or excess/ purulent secretions, ear ache,   fever, chills, sweats, unintended wt loss, classically pleuritic or exertional cp, hemoptysis,  orthopnea pnd or leg swelling, presyncope, palpitations, abdominal pain, anorexia, nausea, vomiting, diarrhea  or change in bowel or bladder habits, change in stools or urine, dysuria,hematuria,  rash, arthralgias, visual complaints, headache, numbness, weakness or ataxia or problems with walking or coordination,  change in mood/affect or memory.          Objective:   Physical Exam  amb slt obese wm nad  Focused on his legs/ able to stand up from chair s using hands    10/25/2015         216   > 12/07/2015  221     08/13/15 212 lb 6.4 oz (96.344 kg)  02/26/15 214 lb (97.07 kg)  02/01/15 214 lb 6.4 oz (97.251 kg)    Vital signs reviewed   HEENT: nl dentition, turbinates, and orophanx. Nl external ear canals without cough reflex   NECK :  without JVD/Nodes/TM/ nl carotid upstrokes bilaterally   LUNGS: no acc muscle use, distant bs bilaterally/ min barrel chest/   without cough on insp or exp maneuvers   CV:  RRR  no s3 or murmur or increase in P2, no edema   ABD:  soft and nontender with nl excursion in the supine position. No bruits or organomegaly, bowel sounds nl  MS:  warm without  deformities, calf tenderness, cyanosis or clubbing  SKIN: warm and dry without lesions    NEURO:  alert, approp, no deficits     CXR PA and Lateral:   08/13/2015 :     I personally reviewed images and agree with radiology impression as follows:   1. Stable chest from 10/25/2014. No acute cardiopulmonary disease. 2. Pleural-parenchymal scarring. COPD cannot be excluded. COPD 3. Prior CABG. Heart size stable.        Assessment & Plan:

## 2015-12-12 ENCOUNTER — Ambulatory Visit (INDEPENDENT_AMBULATORY_CARE_PROVIDER_SITE_OTHER): Payer: BLUE CROSS/BLUE SHIELD | Admitting: Cardiology

## 2015-12-12 ENCOUNTER — Encounter: Payer: Self-pay | Admitting: Cardiology

## 2015-12-12 VITALS — BP 130/88 | HR 74 | Ht 73.0 in | Wt 219.0 lb

## 2015-12-12 DIAGNOSIS — I10 Essential (primary) hypertension: Secondary | ICD-10-CM | POA: Diagnosis not present

## 2015-12-12 DIAGNOSIS — I483 Typical atrial flutter: Secondary | ICD-10-CM | POA: Diagnosis not present

## 2015-12-12 DIAGNOSIS — I2510 Atherosclerotic heart disease of native coronary artery without angina pectoris: Secondary | ICD-10-CM

## 2015-12-12 DIAGNOSIS — Z889 Allergy status to unspecified drugs, medicaments and biological substances status: Secondary | ICD-10-CM

## 2015-12-12 DIAGNOSIS — E782 Mixed hyperlipidemia: Secondary | ICD-10-CM | POA: Diagnosis not present

## 2015-12-12 DIAGNOSIS — Z789 Other specified health status: Secondary | ICD-10-CM

## 2015-12-12 NOTE — Patient Instructions (Signed)
Your physician recommends that you schedule a follow-up appointment in: 3 Months with Dr. Domenic Polite  Your physician has recommended you make the following change in your medication:   Stop Crestor (Call our office in 3-4 weeks and let us know how you are doing)   If you need a refill on your cardiac medications before your next appointment, please call your pharmacy.  Thank you for choosing [facility]!

## 2015-12-12 NOTE — Progress Notes (Signed)
Cardiology Office Note  Date: 12/12/2015   ID: Dennis [name], DOB Aug 23, 1951, MRN [id number]  PCP: Mickie Hillier, MD  Primary Cardiologist: Rozann Lesches, MD   Chief Complaint  Patient presents with  . Coronary Artery Disease  . Atrial Flutter    History of Present Illness: Dennis [name] is a 65 y.o. male last seen in November 2016. He presents with his wife today for a follow-up visit. He tells me that the [facility] in [place] will be closing in June, and he has filed for disability. He reports chronic fatigue as before as well as dyspnea on exertion. He also complains of bilateral leg weakness and discomfort which he attributes to statin therapy. When I saw him back in November 2016, we switched from Vytorin (which he had been a long-term per Dr. [name]) to Crestor to see if he might tolerate this better. Unfortunately, he did not and his repeat total cholesterol did not come down very much anyway.  He does not report any clear-cut angina symptoms. He also has no sense of palpitations although remains in atrial flutter by follow-up ECG today. We have discussed management options for his atrial arrhythmia many times. His CHADSVASC score is 2 (annual stroke risk of approximately 2.3% per year on aspirin versus 0.8% per year on Eliquis). He has been very hesitant to consider anticoagulation therapies for stroke prophylaxis and continues on Aspirin. We have discussed the possibility of considering elective cardioversion and even catheter-based ablation for management of atrial flutter, although this would require that he take anticoagulant treatment at least temporarily, and he has not wanted to push forward with either of these options as yet. It is not entirely clear to me how symptomatic he is with the atrial flutter, and whether he would feel very much different if he even went through cardioversion or ablation. I have explained to him that we will continue to have these  discussions.  His most recent cardiac structural and ischemic testing are outlined below.  I reviewed his chart, he had a recent visit with Dr. Melvyn Novas for COPD.  Past Medical History  Diagnosis Date  . Essential hypertension, benign   . Hyperlipidemia   . COPD (chronic obstructive pulmonary disease) ([place])   . Coronary atherosclerosis of native coronary artery     Multivessel status post CABG  . Insomnia   . Atrial flutter ([place])     Diagnosed by ECG August 2015  . Colitis     Past Surgical History  Procedure Laterality Date  . Coronary artery bypass graft  2005    Dr. Lucianne Lei Trigt: LIMA to LAD, right radial to circumflex, SVG to RCA  . Colonoscopy with propofol N/A 08/30/2015    Procedure: COLONOSCOPY WITH PROPOFOL at cecum at 0814; withdrawal time=8 minutes;  Surgeon: Daneil Dolin, MD;  Location: [facility];  Service: Endoscopy;  Laterality: N/A;    Current Outpatient Prescriptions  Medication Sig Dispense Refill  . albuterol (PROVENTIL HFA;VENTOLIN HFA) 108 (90 BASE) MCG/ACT inhaler Inhale 2 puffs into the lungs every 4 (four) hours as needed for wheezing. 3 Inhaler 1  . aspirin 81 MG tablet Take 81 mg by mouth every other day.     . Budesonide-Formoterol Fumarate (SYMBICORT IN) Inhale into the lungs.    . fenofibrate 160 MG tablet TAKE 1 TABLET BY MOUTH EVERY DAY 90 tablet 1  . fish oil-omega-3 fatty acids 1000 MG capsule Take 2 g by mouth daily.    Marland Kitchen ketoconazole (NIZORAL)  2 % cream Apply 1 application topically 2 (two) times daily as needed for irritation. 30 g 0  . metoprolol (LOPRESSOR) 50 MG tablet TAKE 1 TABLET BY MOUTH TWICE A DAY 180 tablet 0  . mometasone-formoterol (DULERA) 200-5 MCG/ACT AERO Take 2 puffs first thing in am and then another 2 puffs about 12 hours later. 1 Inhaler 11  . Tiotropium Bromide Monohydrate (SPIRIVA RESPIMAT) 2.5 MCG/ACT AERS 2 puffs each am 1 Inhaler 11  . valsartan (DIOVAN) 160 MG tablet Take 1 tablet (160 mg total) by mouth daily. 90 tablet 1  .  zolpidem (AMBIEN) 10 MG tablet TAKE 1 TABLET BY MOUTH AT BEDTIME AS NEEDED FOR SLEEP 90 tablet 1   No current facility-administered medications for this visit.   Allergies:  Ace inhibitors   Social History: The patient  reports that he quit smoking about 17 years ago. His smoking use included Cigarettes. He started smoking about 44 years ago. He has a 54 pack-year smoking history. His smokeless tobacco use includes Chew. He reports that he drinks alcohol. He reports that he does not use illicit drugs.   ROS:  Please see the history of present illness. Otherwise, complete review of systems is positive for chronic fatigue, NYHA class 2-3 dyspnea, intermittent leg weakness and discomfort.  All other systems are reviewed and negative.   Physical Exam: VS:  BP 130/88 mmHg  Pulse 74  Ht '6\' 1"'$  (1.854 m)  Wt 219 lb (99.338 kg)  BMI 28.90 kg/m2  SpO2 99%, BMI Body mass index is 28.9 kg/(m^2).  Wt Readings from Last 3 Encounters:  12/12/15 219 lb (99.338 kg)  12/07/15 220 lb 12.8 oz (100.154 kg)  11/13/15 215 lb (97.523 kg)    Patient is in no acute distress. HEENT: Conjunctiva and lids normal, oropharynx clear. Neck: Supple, no elevated JVP or carotid bruits, no thyromegaly. Lungs: Decreased breath sounds throughout without wheezing, nonlabored breathing at rest. Cardiac: Irregular rate and rhythm, distant heart sounds, no S3 or significant systolic murmur, no pericardial rub. Abdomen: Soft, nontender, bowel sounds present, no guarding or rebound. Extremities: Trace ankle edema, distal pulses 2+. Skin: Warm and dry. Musculoskeletal: No kyphosis. Neuropsychiatric: Alert and oriented x3, affect grossly appropriate.  ECG: Tracing from 09/03/2015 showed atrial flutter with controlled ventricular response.  Recent Labwork: 08/13/2015: Pro B Natriuretic peptide (BNP) 126.0*; TSH 1.83 08/23/2015: BUN 26*; Creatinine, Ser 1.29*; Hemoglobin 15.0; Platelets 163; Potassium 5.1; Sodium 134* 11/29/2015:  ALT 19; AST 27     Component Value Date/Time   CHOL 295* 11/29/2015 0704   CHOL 303* 09/17/2015 0810   TRIG 237* 09/17/2015 0810   HDL 49 09/17/2015 0810   HDL 45 08/25/2014 0922   CHOLHDL 6.2* 09/17/2015 0810   CHOLHDL 5.6 08/25/2014 0922   VLDL 35 08/25/2014 0922   LDLCALC 207* 09/17/2015 0810   LDLCALC 174* 08/25/2014 0922    Other Studies Reviewed Today:  Carlton Adam Cardiolite 02/21/2014: EXAM: MYOCARDIAL IMAGING WITH SPECT (REST AND PHARMACOLOGIC-STRESS)  GATED LEFT VENTRICULAR WALL MOTION STUDY  LEFT VENTRICULAR EJECTION FRACTION  TECHNIQUE: Standard myocardial SPECT imaging was performed after resting intravenous injection of 10 mCi [id number]. Subsequently, intravenous infusion of Lexiscan was performed under the supervision of the Cardiology staff. At peak effect of the drug, 30 mCi [id number] was injected intravenously and standard myocardial SPECT imaging was performed. Quantitative gated imaging was also performed to evaluate left ventricular wall motion, and estimate left ventricular ejection fraction.  COMPARISON: None.  FINDINGS: FINDINGS Baseline EKG showed  atrial fibrillation with right bundle branch block. After injection heart rate increased some 79 beats per min up to 112 beats per min and blood pressure decreased from 127/85 down to 118/79. The test was stopped after injection was complete, the patient did not experience any chest pain. Post-injection EKG showed no specific ischemic changes, the patient remained in atrial fibrillation with no new or arrhythmias.  Myocardial perfusion imaging  Raw images showed significant radiotracer uptake in the gut. There was a moderate sized mild intensity inferior and inferoapical wall defect seen in the pre-injection images. This same defect was less intense in the post-injection images. The inferior wall had normal wall motion. Overall findings are consistent with sub-  diaphragmatic attenuation. There were no other myocardial perfusion defects.  End-diastolic volume 118 mL, end systolic volume 58 mL, left ventricular ejection fraction 51%. Septal motion was consistent with bundle branch block.  Post-injection EKG showed no specific ischemic changes and no significant arrhythmias.  IMPRESSION:  1. No reversible ischemia or infarction.  2. Normal left ventricular wall motion, pattern consistent with bundle branch block.  3. Left ventricular ejection fraction 51%  4. Low-risk stress test findings*.  Echocardiogram 07/24/2014: Study Conclusions  - Left ventricle: The cavity size was normal. Wall thickness was increased increased in a pattern of mild to moderate LVH. Systolic function was normal. The estimated ejection fraction was in the range of 50% to 55%. - Regional wall motion abnormality: Mild hypokinesis of the basal-mid anteroseptal myocardium. - Aortic valve: Mildly to moderately calcified annulus. Moderately thickened leaflets. - Aorta: Visualized portions of the proximal ascending aorta are normal in size, 3.3 cm. Aortic root dimension: 42 mm (ED). - Aortic root: The aortic root was mildly dilated. - Mitral valve: Mildly calcified annulus. Mildly thickened leaflets . There was mild regurgitation. - Left atrium: The atrium was mildly dilated. - Atrial septum: No defect or patent foramen ovale was identified. - Systemic veins: IVC is dilated with normal respriatory response, estimated RA pressure . - Technically adequate study.   Assessment and Plan:  1. Chronic atrial flutter. CHADSVASC score is 2. As outlined above, we continue to discuss treatment options, and at this point he voices comfort with remaining on aspirin and strategy of heart rate control without pursuing either cardioversion or ablation, which would require anticoagulation at least in the short-term. The absolute duration of atrial  flutter is not clear, was diagnosed when I first met him in August 2015 by a basic screening ECG - he was asymptomatic in terms of palpitations. It is also not clear how much symptom benefit he would receive by restoring sinus rhythm. We continue to discuss the options for management over time. I have also offered referral to EP for another opinion.  2. Hyperlipidemia with statin intolerance. We have decided to stop Crestor, he will observe for any improvement in his leg weakness and discomfort. He previously had trouble with Vytorin. I have once again offered him referral to the lipid clinic to discuss other options, and he has declined.  3. Multivessel CAD status post CABG in 2005. He had a low risk Cardiolite in March 2015, no active ischemia at that time. Continue observation.  4. COPD, followed by Dr. Sherene Sires.  5. Essential hypertension. No changes made to current regimen.  Current medicines were reviewed with the patient today.   Orders Placed This Encounter  Procedures  . EKG 12-Lead    Disposition: FU with me in 3 months.   Signed, Jonelle Sidle,  MD, [facility] 12/12/2015 9:08 AM    [facility] [address], [place], [place] [address] Phone: [phone]; Fax: [phone]

## 2015-12-19 ENCOUNTER — Encounter (HOSPITAL_COMMUNITY)
Admission: RE | Admit: 2015-12-19 | Discharge: 2015-12-19 | Disposition: A | Discharge status: Home / Self Care | Payer: BLUE CROSS/BLUE SHIELD | Source: Ambulatory Visit | Attending: Internal Medicine | Admitting: Internal Medicine

## 2015-12-19 ENCOUNTER — Encounter (HOSPITAL_COMMUNITY): Payer: Self-pay

## 2015-12-19 VITALS — BP 132/88 | HR 83 | Ht 73.0 in | Wt 218.4 lb

## 2015-12-19 DIAGNOSIS — J449 Chronic obstructive pulmonary disease, unspecified: Secondary | ICD-10-CM | POA: Diagnosis present

## 2015-12-19 NOTE — Patient Instructions (Signed)
Pt has finished education/orientation and is scheduled to start PR on 12/25/15 at 10:45 am. Pt has been instructed to arrive to class 15 minutes early for scheduled class. Pt has been instructed to wear comfortable clothing and shoes with rubber soles. Pt has been told to take their medications 1 hour prior to coming to class.  If the patient is not going to attend class, he/she has been instructed to call.

## 2015-12-19 NOTE — Progress Notes (Signed)
Dennis [name] 65 y.o. male  Initial Psychosocial Assessment  Pt psychosocial assessment reveals pt lives with their spouse. Pt is currently unemployed, disabled. Pt hobbies include fishing/hunting/time with grandchildren. Pt reports his stress level is low. Areas of stress/anxiety include Family.  Pt does not exhibit signs of depression. Signs of depression include none and none. Pt shows good  coping skills with positive outlook . Staff offered emotional support and reassurance. Monitor and evaluate progress toward psychosocial goal(s).  Goal(s): Help patient work toward returning to meaningful activities that improve patient's QOL and are attainable with patient's lung disease Breathe better Lose weight   12/19/2015 2:58 PM

## 2015-12-19 NOTE — Outcomes Assessment (Signed)
[name] Baseline Outcomes Assessment   Anthropometrics:  . Height (inches): 73 . Weight (kg): 99.1 . Grip strength was measured using a Dynamometer.  The patient's highest score was a 110.  Functional Status/Exercise Capacity: . Dennis [name] had a resting heart rate of 83 BPM, a resting blood pressure of 132/88, and an oxygen saturation of 96 % on 0 liters of O2.  [name] performed a 6-minute walk test on 12/19/2015.  The patient completed 1300 feet in 6 minutes with 0 rest breaks.  This quantifies 2.89 METS.   Dyspnea Measures: . The [facility] is a simple and standardized method of classifying disability in patients with COPD.  The assessment correlates disability and dyspnea.  At entrance the patient scored a 3. The scale is provided below.   0= I only get breathless with strenuous exercise. 1= I get short of breath when hurrying on level ground or walking up a slight incline. 2= On level ground, I walk slower than people of the same age because of breathlessness, or have to stop for breath when walking at my own pace. 3= I stop for breath after walking 100 yards or after a few minutes on level ground. 4=I am too breathless to leave the house or I am breathless when dressing.   . The patient completed the [place] ([facility] [place]).  This questionnaire relates activities of daily living and shortness of breath.  The score ranges from 0-120, a higher score relates to severe shortness of breath during activities of daily living. The patient's score at entrance was 70.  Quality of Life: . Ferrans and Powers Quality of Life Index Pulmonary Version is used to assess the patients satisfaction in different domains of their life; health and functioning, socioeconomic, psychological/spiritual, and family. The overall score is recorded out of 30 points.  The patient's goal is to achieve an overall score of 21 or higher.   [name] received a 20.96 at entrance.  . The Patient Health Questionnaire (PHQ-2) is a first step approach for the screening of depression.  If the patient scores positive on the PHQ-2 the patient should be further assessed with the PHQ-9.  The Patient Health Questionnaire (PHQ-9) assesses the degree of depression.  Depression is important to monitor and track in pulmonary patients due to its prevalence in the population.  If the patient advances to the PHQ-9 the goal is to score less than 4 on this assessment.  [name] scored a 2 at entrance.  Clinical Assessment Tools: . The COPD Assessment Test (CAT) is a measurement tool to quantify how much of an impact the disease has on the patient's life.  This assessment aids the Pulmonary Rehab Team in designing the patients individualized treatment plan.  A CAT score ranges from 0-40.  A score of 10 or below indicates that COPD has a low impact on the patient's life whereas a score of 30 or higher indicates a severe impact. The patient's goal is a decrease of 1 point from entrance to discharge.  [name] had a CAT score of 20 at entrance.  Nutrition: . The "Rate My Plate" is a dietary assessment that quantifies the balance of a patient's diet.  This tool allows the Pulmonary Rehab Team to key in on the areas of the patient's diet that needs improving.  The team can then focus their nutritional education on those areas.  If the patient scores 24-40, this means there are many ways  they can make their eating habits healthier, 41-57 states that there are some ways they can make their eating habits healthier and a score of 58-72 states that they are making many healthy choices.  The patient's goal is to achieve a score of 49 or higher on this assessment.  [name] scored a 50 at entrance.  Oxygen Compliance: . Patient is currently on 0 liters at rest, 0 liters at night, and 0 liters for exercise.  [name] is not currently using a cpap/bipap at night.    Education: . [name] will attend education classes during the course of Pulmonary Rehab.  Education classes that will be offered to the patient are Activities of Daily Living and Energy Conservation, Pursed Lip Breathing and Diaphragmatic Breathing, Nutrition, Exercise for the Pulmonary Patient, Warning Signs of Infection, Chronic Lung Disease, Advanced Directives, Medications, and Stress and Meditation.  The patient completed an assessment at the entrance of the program and will complete it again upon discharge to demonstrate the level of understanding provided by the educational classes.  This assessment includes 14 questions regarding all of the education topics above.  [name] achieved a score of 10/14 at entrance.  Smoking Cessation:  N/A  Exercise: [name] will be provided with an individualized Home Exercise Prescription (HEP) at the entrance of the program.  The patient will be followed by the Pulmonary Exercise Physiologist throughout the program to assist with the progression of the frequency, intensity, time, and type of exercise. The patient's long-term goal is to be exercising 30-60 minutes, 3-5 days per week. At entrance, the patient was exercising 3 days at home.

## 2015-12-19 NOTE — Progress Notes (Signed)
Patient arrived for 1st visit/orientation/education at 8:00 am. Patient was referred to PR by Dr. Melvyn Novas due to COPD J44.9. During orientation advised patient on arrival and appointment times what to wear, what to do before, during and after exercise. Reviewed attendance and class policy. Talked about inclement weather and class consultation policy. Pt is scheduled to return Pulm Rehab on 12/25/15 at 10:45 am. Pt was advised to come to class 15 minutes before class starts. He was also given instructions on meeting with the dietician and attending the Family Structure classes. Pt is eager to get started. Patient was able to complete 6 minute walk test. Patient c/o leg pain at the end of the walk test. Pain was 5 out 10 on scale. Patient was measured for the equipment. Discussed equipment safety with patient. Took patient pre-anthropometric measurements. Patient scored 1 on PHQ-2 and 2 on PHQ-9. Patient does not feel he needs counseling. Patient finished visit at 10:00 am.

## 2015-12-19 NOTE — Progress Notes (Signed)
Cardiac/Pulmonary Rehab Medication Review by a Pharmacist  Does the patient  feel that his/her medications are working for him/her?  yes  Has the patient been experiencing any side effects to the medications prescribed?  no  Does the patient measure his/her own blood pressure or blood glucose at home?  yes   Does the patient have any problems obtaining medications due to transportation or finances?   no  Understanding of regimen: excellent Understanding of indications: excellent Potential of compliance: excellent  Questions asked to Determine Patient Understanding of Medication Regimen:  1. What is the name of the medication?  2. What is the medication used for?  3. When should it be taken?  4. How much should be taken?  5. How will you take it?  6. What side effects should you report?  Understanding Defined as: Excellent: All questions above are correct Good: Questions 1-4 are correct Fair: Questions 1-2 are correct  Poor: 1 or none of the above questions are correct   Pharmacist comments: Pt states that MD placed "statin" medication on hold for 4 weeks to determine if causing problems with legs, myalgias.  Pt does no c/o any other side effects.  PTA med list updated.    Hart Robinsons A 12/19/2015 8:40 AM

## 2015-12-25 ENCOUNTER — Encounter (HOSPITAL_COMMUNITY)
Admission: RE | Admit: 2015-12-25 | Discharge: 2015-12-25 | Disposition: A | Discharge status: Home / Self Care | Payer: BLUE CROSS/BLUE SHIELD | Source: Ambulatory Visit | Attending: Internal Medicine | Admitting: Internal Medicine

## 2015-12-25 DIAGNOSIS — J449 Chronic obstructive pulmonary disease, unspecified: Secondary | ICD-10-CM | POA: Diagnosis not present

## 2015-12-27 ENCOUNTER — Encounter (HOSPITAL_COMMUNITY)
Admission: RE | Admit: 2015-12-27 | Discharge: 2015-12-27 | Disposition: A | Discharge status: Home / Self Care | Payer: BLUE CROSS/BLUE SHIELD | Source: Ambulatory Visit | Attending: Internal Medicine | Admitting: Internal Medicine

## 2015-12-27 DIAGNOSIS — J449 Chronic obstructive pulmonary disease, unspecified: Secondary | ICD-10-CM | POA: Insufficient documentation

## 2016-01-01 ENCOUNTER — Encounter (HOSPITAL_COMMUNITY)
Admission: RE | Admit: 2016-01-01 | Discharge: 2016-01-01 | Disposition: A | Discharge status: Home / Self Care | Payer: BLUE CROSS/BLUE SHIELD | Source: Ambulatory Visit | Attending: Internal Medicine | Admitting: Internal Medicine

## 2016-01-01 DIAGNOSIS — J449 Chronic obstructive pulmonary disease, unspecified: Secondary | ICD-10-CM | POA: Diagnosis not present

## 2016-01-01 NOTE — Progress Notes (Signed)
Pulmonary Rehabilitation Program Outcomes Report   Orientation:  12/19/15 Graduate Date:  tbd Discharge Date:  tbd # of sessions completed: 3  Pulmonologist: Wert Family MD:  Genevieve Norlander Time:  [id number]  A.  Exercise Program:  Tolerates exercise @ 4.50 METS for 15 minutes and Walk Test Results:  Pre: 2.89  B.  Mental Health:  Good mental attitude and PHQ-9: 2  C.  Education/Instruction/Skills  Accurately checks own pulse.  Rest:  93  Exercise:  112 and Uses Perceived Exertion Scale and/or Dyspnea Scale  Demonstrates accurate pursed lip breathing  D.  Nutrition/Weight Control/Body Composition:  Adherence to prescribed nutrition program: fair    E.  Blood Lipids    Lab Results  Component Value Date   CHOL 295* 11/29/2015   HDL 49 09/17/2015   LDLCALC 207* 09/17/2015   TRIG 237* 09/17/2015   CHOLHDL 6.2* 09/17/2015    F.  Lifestyle Changes:  Making positive lifestyle changes and Not smoking:  Quit 1999  G.  Symptoms noted with exercise:  Asymptomatic  Report Completed By:  Stevphen Rochester RN   Comments:  This is the patients first week progress note for [facility].

## 2016-01-03 ENCOUNTER — Encounter (HOSPITAL_COMMUNITY)
Admission: RE | Admit: 2016-01-03 | Discharge: 2016-01-03 | Disposition: A | Discharge status: Home / Self Care | Payer: BLUE CROSS/BLUE SHIELD | Source: Ambulatory Visit | Attending: Internal Medicine | Admitting: Internal Medicine

## 2016-01-03 DIAGNOSIS — J449 Chronic obstructive pulmonary disease, unspecified: Secondary | ICD-10-CM | POA: Diagnosis not present

## 2016-01-08 ENCOUNTER — Encounter (HOSPITAL_COMMUNITY): Payer: BLUE CROSS/BLUE SHIELD

## 2016-01-10 ENCOUNTER — Encounter (HOSPITAL_COMMUNITY): Payer: BLUE CROSS/BLUE SHIELD

## 2016-01-15 ENCOUNTER — Encounter (HOSPITAL_COMMUNITY)
Admission: RE | Admit: 2016-01-15 | Discharge: 2016-01-15 | Disposition: A | Discharge status: Home / Self Care | Payer: BLUE CROSS/BLUE SHIELD | Source: Ambulatory Visit | Attending: Internal Medicine | Admitting: Internal Medicine

## 2016-01-15 DIAGNOSIS — J449 Chronic obstructive pulmonary disease, unspecified: Secondary | ICD-10-CM | POA: Diagnosis not present

## 2016-01-17 ENCOUNTER — Encounter (HOSPITAL_COMMUNITY): Payer: BLUE CROSS/BLUE SHIELD

## 2016-01-19 ENCOUNTER — Other Ambulatory Visit: Payer: Self-pay | Admitting: Family Medicine

## 2016-01-22 ENCOUNTER — Encounter (HOSPITAL_COMMUNITY)
Admission: RE | Admit: 2016-01-22 | Discharge: 2016-01-22 | Disposition: A | Discharge status: Home / Self Care | Payer: BLUE CROSS/BLUE SHIELD | Source: Ambulatory Visit | Attending: Internal Medicine | Admitting: Internal Medicine

## 2016-01-22 DIAGNOSIS — J449 Chronic obstructive pulmonary disease, unspecified: Secondary | ICD-10-CM | POA: Diagnosis not present

## 2016-01-24 ENCOUNTER — Encounter (HOSPITAL_COMMUNITY)
Admission: RE | Admit: 2016-01-24 | Discharge: 2016-01-24 | Disposition: A | Discharge status: Home / Self Care | Payer: BLUE CROSS/BLUE SHIELD | Source: Ambulatory Visit | Attending: Internal Medicine | Admitting: Internal Medicine

## 2016-01-24 DIAGNOSIS — J449 Chronic obstructive pulmonary disease, unspecified: Secondary | ICD-10-CM | POA: Diagnosis present

## 2016-01-28 ENCOUNTER — Other Ambulatory Visit: Payer: Self-pay | Admitting: Family Medicine

## 2016-01-28 NOTE — Telephone Encounter (Signed)
Ok six mo worth 

## 2016-01-28 NOTE — Progress Notes (Signed)
Dennis [name] 65 y.o. male  30 day Psychosocial Note  Patient psychosocial assessment reveals no barriers to participation in Pulmonary Rehab.  Patient does exhibit positive coping skills. Offered emotional support and reassurance. Patient does feel he is making progress toward Pulmonary Rehab goals. Patient reports his health and activity level has improved in the past 30 days as evidenced by patient's report of feeling a little better. Patient states family/friends have not noticed changes in his activity or mood. Patient reports feeling positive about current and projected progression in Pulmonary Rehab. After reviewing the patient's treatment plan, the patient is making progress toward Pulmonary Rehab goals. Patient's rate of progress toward rehab goals is good. Plan of action to help patient continue to work towards rehab goals include support, education and encouragment. Will continue to monitor and evaluate progress toward psychosocial goal(s).  Goal(s) in progress: Help patient work toward returning to meaningful activities that improve patient's QOL and are attainable with patient's lung disease Breathe better Lose weight

## 2016-01-29 ENCOUNTER — Encounter (HOSPITAL_COMMUNITY)
Admission: RE | Admit: 2016-01-29 | Discharge: 2016-01-29 | Disposition: A | Discharge status: Home / Self Care | Payer: BLUE CROSS/BLUE SHIELD | Source: Ambulatory Visit | Attending: Internal Medicine | Admitting: Internal Medicine

## 2016-01-29 DIAGNOSIS — J449 Chronic obstructive pulmonary disease, unspecified: Secondary | ICD-10-CM | POA: Diagnosis not present

## 2016-01-30 DIAGNOSIS — Z029 Encounter for administrative examinations, unspecified: Secondary | ICD-10-CM

## 2016-01-31 ENCOUNTER — Encounter (HOSPITAL_COMMUNITY)
Admission: RE | Admit: 2016-01-31 | Discharge: 2016-01-31 | Disposition: A | Discharge status: Home / Self Care | Payer: BLUE CROSS/BLUE SHIELD | Source: Ambulatory Visit | Attending: Internal Medicine | Admitting: Internal Medicine

## 2016-01-31 DIAGNOSIS — J449 Chronic obstructive pulmonary disease, unspecified: Secondary | ICD-10-CM | POA: Diagnosis not present

## 2016-02-04 ENCOUNTER — Ambulatory Visit: Payer: BLUE CROSS/BLUE SHIELD | Admitting: Family Medicine

## 2016-02-05 ENCOUNTER — Encounter (HOSPITAL_COMMUNITY)
Admission: RE | Admit: 2016-02-05 | Discharge: 2016-02-05 | Disposition: A | Discharge status: Home / Self Care | Payer: BLUE CROSS/BLUE SHIELD | Source: Ambulatory Visit | Attending: Internal Medicine | Admitting: Internal Medicine

## 2016-02-05 DIAGNOSIS — J449 Chronic obstructive pulmonary disease, unspecified: Secondary | ICD-10-CM | POA: Diagnosis not present

## 2016-02-06 ENCOUNTER — Encounter: Payer: Self-pay | Admitting: Family Medicine

## 2016-02-06 ENCOUNTER — Telehealth: Payer: Self-pay | Admitting: Family Medicine

## 2016-02-06 ENCOUNTER — Ambulatory Visit (INDEPENDENT_AMBULATORY_CARE_PROVIDER_SITE_OTHER): Payer: BLUE CROSS/BLUE SHIELD | Admitting: Family Medicine

## 2016-02-06 VITALS — BP 128/88 | Ht 73.0 in | Wt 219.0 lb

## 2016-02-06 DIAGNOSIS — I10 Essential (primary) hypertension: Secondary | ICD-10-CM | POA: Diagnosis not present

## 2016-02-06 DIAGNOSIS — E785 Hyperlipidemia, unspecified: Secondary | ICD-10-CM | POA: Diagnosis not present

## 2016-02-06 DIAGNOSIS — R0600 Dyspnea, unspecified: Secondary | ICD-10-CM | POA: Diagnosis not present

## 2016-02-06 DIAGNOSIS — Z0289 Encounter for other administrative examinations: Secondary | ICD-10-CM

## 2016-02-06 DIAGNOSIS — Z79899 Other long term (current) drug therapy: Secondary | ICD-10-CM

## 2016-02-06 MED ORDER — VALSARTAN 160 MG PO TABS: ORAL_TABLET | ORAL | Status: DC

## 2016-02-06 MED ORDER — AMOXICILLIN 500 MG PO CAPS: 500.0000 mg | ORAL_CAPSULE | Freq: Three times a day (TID) | ORAL | Status: DC

## 2016-02-06 NOTE — Telephone Encounter (Signed)
Blood work ordered in EPIC. Patient notified. 

## 2016-02-06 NOTE — Telephone Encounter (Signed)
bw orders for jun appt  Last labs  11/29/15  Cholesterol, hep  Advised may be too early

## 2016-02-06 NOTE — Telephone Encounter (Signed)
Changed meds. Lip liv

## 2016-02-06 NOTE — Progress Notes (Signed)
   Subjective:    Patient ID: Dennis [name], male    DOB: 1951-04-05, 65 y.o.   MRN: [id number]  patient once again presents with several substantial concerns     Hyperlipidemia This is a chronic problem. The current episode started more than 1 year ago.   Follow up on disability.  Stays completely unable to work with current health challenges.  Pt wants refills on valsartan. orig switched to that to get the bp dfown while not causing cough. Patient stopped  Valsartan on his own. Switch back to enalapril. This despite the fact the pulmonologist said stopped enalapril hoping to diminishes chronic cough. Patient notes blood pressure overall running good  Patient reports substantial improvement from pulmonary rehabilitation.  Pt has had    Still having substantial cough and congestion. Cough productive at times. Feels he likely needs another antibiotic on skin.  Pt was on crestor, having a lot of leg problems, Stopped crestor, legs werre stil hurting. Was off Crestor for a month.   vytorin now back on  , handling well.    Originally prescribed by Dr. Melvyn Novas.      Review of Systems  chronic shortness of breath no current chest pain no current heart arrhythmias    Objective:   Physical Exam  alert no acute distress. Vitals stable blood pressure good on repeat HEENT some nasal congestion lungs bronchial cough no acute wheezes diminished breath sounds heart regular rhythm currently       Assessment & Plan:   impression 1 acute bronchitis #2 hypertension good control #3 noncompliance with blood pressure medication recommendations. Advised patient strongly today to stop enalapril and resume valsartan. Patient agrees to this. Once to start at half dose since blood pressures been well #4 COPD ongoing and substantial. Assistant with pulmonary rehabilitation. #5 atrial flutter currently not present #6 coronary artery disease ongoing #7 hyperlipidemia and discuss plan medications refilled  diet exercise discussed recheck in several months. Patient is disabled [employer]

## 2016-02-07 ENCOUNTER — Encounter (HOSPITAL_COMMUNITY): Payer: BLUE CROSS/BLUE SHIELD

## 2016-02-08 ENCOUNTER — Telehealth: Payer: Self-pay | Admitting: Family Medicine

## 2016-02-08 NOTE — Telephone Encounter (Signed)
Patient called to check on disability form to see if ready. He wanted it filled out after his appointment on 3/15. I had to explained to him that the doctor hasnt finished his dictation yet and I would give him a call when ready.He needs form by 3/20. Its in your yellow folder.

## 2016-02-11 ENCOUNTER — Other Ambulatory Visit: Payer: Self-pay | Admitting: *Deleted

## 2016-02-11 ENCOUNTER — Telehealth: Payer: Self-pay | Admitting: Family Medicine

## 2016-02-11 MED ORDER — HYDROCODONE-HOMATROPINE 5-1.5 MG/5ML PO SYRP: ORAL_SOLUTION | ORAL | Status: DC

## 2016-02-11 NOTE — Telephone Encounter (Signed)
Rx up front for pick up. Patient notified. 

## 2016-02-11 NOTE — Telephone Encounter (Signed)
Yes, four ounces onte tspn qhs prn

## 2016-02-11 NOTE — Telephone Encounter (Signed)
Pt states you issued him some antibiotics for bronchitis  Wants to know if he can get some Hycodan called in to help with  His cough at night  Seen 3/15

## 2016-02-12 ENCOUNTER — Encounter (HOSPITAL_COMMUNITY): Payer: BLUE CROSS/BLUE SHIELD

## 2016-02-14 ENCOUNTER — Encounter (HOSPITAL_COMMUNITY): Payer: BLUE CROSS/BLUE SHIELD

## 2016-02-19 ENCOUNTER — Encounter (HOSPITAL_COMMUNITY)
Admission: RE | Admit: 2016-02-19 | Discharge: 2016-02-19 | Disposition: A | Discharge status: Home / Self Care | Payer: BLUE CROSS/BLUE SHIELD | Source: Ambulatory Visit | Attending: Internal Medicine | Admitting: Internal Medicine

## 2016-02-19 ENCOUNTER — Encounter: Payer: Self-pay | Admitting: Internal Medicine

## 2016-02-19 DIAGNOSIS — J449 Chronic obstructive pulmonary disease, unspecified: Secondary | ICD-10-CM | POA: Diagnosis not present

## 2016-02-19 NOTE — Progress Notes (Signed)
Patient was given individual home exercise plan. Handout was reviewed and discussed with patient. Patient's long term goals were reviewed and reassessed. Patient signed home exercise plan and expressed understanding.   

## 2016-02-20 NOTE — Progress Notes (Addendum)
Dennis [name] 65 y.o. male  73 day Psychosocial Note  Patient psychosocial assessment reveals no barriers to participation in Pulmonary Rehab.  Patient does feel he is making progress toward Pulmonary Rehab goals. Patient reports his health and activity level has improved in the past 30 days as evidenced by patient's report of increased ability to do things longer without so much SOB and feels his legs are getting stronger. Patient states family/friends have not noticed changes in his activity. Patient reports feeling positive about current and projected progression in Pulmonary Rehab. After reviewing the patient's treatment plan, the patient is making progress toward Pulmonary Rehab goals. Patient's rate of progress toward rehab goals is good. Plan of action to help patient continue to work towards rehab goals include encouragement, support and education. Will continue to monitor and evaluate progress toward psychosocial goal(s).  Goal(s) in progress: Help patient work toward returning to meaningful activities that improve patient's QOL and are attainable with patient's lung disease  Breathe better  Lose weight

## 2016-02-21 ENCOUNTER — Encounter (HOSPITAL_COMMUNITY): Payer: BLUE CROSS/BLUE SHIELD

## 2016-02-25 ENCOUNTER — Ambulatory Visit: Payer: BLUE CROSS/BLUE SHIELD | Admitting: Internal Medicine

## 2016-02-25 ENCOUNTER — Telehealth: Payer: Self-pay | Admitting: Internal Medicine

## 2016-02-25 NOTE — Telephone Encounter (Signed)
Noted  

## 2016-02-25 NOTE — Telephone Encounter (Signed)
Noted. Routing to RMR to review.

## 2016-02-25 NOTE — Telephone Encounter (Signed)
PATIENT RECEIVED LETTER TO MAKE APPOINTMENT FOR PP FU AND HE STATED HE WAS FINE AND DID NOT NEED TO HAVE AN OFFICE VISIT

## 2016-02-26 ENCOUNTER — Encounter (HOSPITAL_COMMUNITY)
Admission: RE | Admit: 2016-02-26 | Discharge: 2016-02-26 | Disposition: A | Discharge status: Home / Self Care | Payer: BLUE CROSS/BLUE SHIELD | Source: Ambulatory Visit | Attending: Internal Medicine | Admitting: Internal Medicine

## 2016-02-26 DIAGNOSIS — J449 Chronic obstructive pulmonary disease, unspecified: Secondary | ICD-10-CM | POA: Diagnosis present

## 2016-02-28 ENCOUNTER — Encounter (HOSPITAL_COMMUNITY): Payer: BLUE CROSS/BLUE SHIELD

## 2016-02-28 ENCOUNTER — Encounter: Payer: Self-pay | Admitting: Internal Medicine

## 2016-02-28 ENCOUNTER — Ambulatory Visit (INDEPENDENT_AMBULATORY_CARE_PROVIDER_SITE_OTHER): Payer: BLUE CROSS/BLUE SHIELD | Admitting: Internal Medicine

## 2016-02-28 VITALS — BP 152/98 | HR 84 | Ht 73.0 in | Wt 220.0 lb

## 2016-02-28 DIAGNOSIS — J449 Chronic obstructive pulmonary disease, unspecified: Secondary | ICD-10-CM

## 2016-02-28 DIAGNOSIS — R058 Other specified cough: Secondary | ICD-10-CM

## 2016-02-28 DIAGNOSIS — R05 Cough: Secondary | ICD-10-CM | POA: Diagnosis not present

## 2016-02-28 NOTE — Progress Notes (Signed)
Subjective:    Patient ID: Dennis [name], male    DOB: 12-Apr-1951,  MRN: [id number]    Brief patient profile:  18 yowm quit smoking around 1999 cough> sob both better then sev years later worse breathing/fatigue > 20005 much better and able to gym p rehab including aerobics and miniimal respiratory symptoms / rare need for saba at wt around 210 and gradually downhill since then to the point to where out of breath house to mailbox slt incline x 100 ft assoc with audible wheeze   History of Present Illness  08/13/2015 1st [facility] [place] [facility] office visit/ Dennis [name]   Chief Complaint  Patient presents with  . Pulmonary Consult    Referred by Dr. Wolfgang [name] for eval of COPD. He states dxed with COPD in 2006. He c/o DOE with minimal exertion such as walking to the mailbox. 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 He has occ cough- prod with clear sputum.  He is using albuterol 1-3 x per day on average.   cough/ sob worse at work, better at home unless go out in heat  - wife hears noisy breathing and can't get to mb and back s stopping to rest. Prev eval by [facility] for same and told he had mild copd and downhill since then despite no smoking in interim rec Stop vasotec  And start avapro 160 mg daily  Pepcid ac 20 mg one at bedtime  GERD  Strongly recommend you discuss the statin issue directly with Dr Dennis [name]> never did, doesn't see Dennis [name] it turns out      10/01/2015  f/u ov/Dennis [name] re: COPD III with reversibility  Chief Complaint  Patient presents with  . Follow-up    PFT. Pt states that his fatigue level with exertion has increased. Pt states that his DOE has not changed. Pt does have issues with medication side effects, especially the HTN medication.   muscles feel like 50 lbs on each leg w/in sev months of starting vytorin never  Better > says already discussed with Dr Dennis [name]  Breathing better p total of 4 puff per day saba but not sustained  Concerned about heart rate  higher on arb than acei Also doesn't think he can return to work as required to climb up steps and inhale fumes rec Symbicort 160 Take 2 puffs first thing in am and then another 2 puffs about 12 hours later.  Work on inhaler technique:    In my opinion you should not be required to inhale any irritates or fumes / vapors or climb steps step frequently on the basis of your copd which is moderately severe but partially reversible (the reversible part will be hard to control if you don't breath clean air)    10/25/2015  f/u ov/Dennis [name] re: copd III with reversibility on maint rx with symbicort 160 2bid   Chief Complaint  Patient presents with  . Follow-up    Pt c/o cough with clear mucus for about 6 days and increased SOB/wheeze. Pt did use albuterol via nebulizer once with some improvement. Pt does report some improvement on Symbicort, however his insurance does not cover it. Pt is also reporting unwanted side-effects from Diovan.   Walking at even a mall ok but can't do any steps s sob  / ok mailbox and back ok now  Acutely worse p caught "head cold" x 4 days prior to OV  rec zpak  For cough >>  mucinex dm 1200 mg  every 12 hours Continue symbicort =  dulera 200 Take 2 puffs first thing in am and then another 2 puffs about 12 hours later > call me if not improving Add spiriva 2 pffs each am  Only use your albuterol as a rescue medication as a rescue    12/07/2015  f/u ov/Dennis [name] re: GOLD II copd / spiriva respimat/symb and prn saba  Chief Complaint  Patient presents with  . Follow-up    Pt states his cough has resolved. He states that his breathing is no better. Has no energy.   did not use saba before maint rx which hasn't yet taken today  Cough gone to his satisfaction  Main limit is bilateral calf pain/weak x across a parking lot  rec Please see patient coordinator before you leave today  to schedule pulmonary rehab at [facility] Remember to use the symbicort and spiriva first thing in am      02/28/2016  f/u ov/Dennis [name] re:   GOLD II copd/ symbicort/ not on spiriva / unable to read dial of symbicort correctly  Chief Complaint  Patient presents with  . Follow-up    Breathing has improved "as long as I stay around clean air". Energy level has improved.   mailbox and back is ok/ joined Y/ treadmill x one mile flat in 20 min s stopping  Main cc is keep getting bad colds > cough p exp to sick grandchildren/ has dulera empty and says has been using both symb 160 and dulera   No obvious day to day or daytime variability or assoc excess/ purulent sputum or mucus plugs   cp or chest tightness, subjective wheeze or overt sinus or hb symptoms. No unusual exp hx or h/o childhood pna/ asthma or knowledge of premature birth.  Sleeping ok without nocturnal  or early am exacerbation  of respiratory  c/o's or need for noct saba. Also denies any obvious fluctuation of symptoms with weather or environmental changes or other aggravating or alleviating factors except as outlined above   Current Medications, Allergies, Complete Past Medical History, Past Surgical History, Family History, and Social History were reviewed in [employer] record.  ROS  The following are not active complaints unless bolded sore throat, dysphagia, dental problems, itching, sneezing,  nasal congestion or excess/ purulent secretions, ear ache,   fever, chills, sweats, unintended wt loss, classically pleuritic or exertional cp, hemoptysis,  orthopnea pnd or leg swelling, presyncope, palpitations, abdominal pain, anorexia, nausea, vomiting, diarrhea  or change in bowel or bladder habits, change in stools or urine, dysuria,hematuria,  rash, arthralgias, visual complaints, headache, numbness, weakness or ataxia or problems with walking or coordination,  change in mood/affect or memory.          Objective:   Physical Exam  amb slt obese wm nad   Much brighter affect    10/25/2015         216   > 12/07/2015   221 > 02/28/2016   221     08/13/15 212 lb 6.4 oz (96.344 kg)  02/26/15 214 lb (97.07 kg)  02/01/15 214 lb 6.4 oz (97.251 kg)    Vital signs reviewed   HEENT: nl dentition, turbinates, and orophanx. Nl external ear canals without cough reflex   NECK :  without JVD/Nodes/TM/ nl carotid upstrokes bilaterally   LUNGS: no acc muscle use, distant bs bilaterally/ min barrel chest/   without cough on insp or exp maneuvers   CV:  RRR  no s3 or  murmur or increase in P2, no edema   ABD:  soft and nontender with nl excursion in the supine position. No bruits or organomegaly, bowel sounds nl  MS:  warm without deformities, calf tenderness, cyanosis or clubbing  SKIN: warm and dry without lesions    NEURO:  alert, approp, no deficits     CXR PA and Lateral:   08/13/2015 :     I personally reviewed images and agree with radiology impression as follows:   1. Stable chest from 10/25/2014. No acute cardiopulmonary disease. 2. Pleural-parenchymal scarring. COPD cannot be excluded. COPD 3. Prior CABG. Heart size stable.        Assessment & Plan:   Outpatient Encounter Prescriptions as of 02/28/2016  Medication Sig  . albuterol (PROVENTIL HFA;VENTOLIN HFA) 108 (90 BASE) MCG/ACT inhaler Inhale 2 puffs into the lungs every 4 (four) hours as needed for wheezing.  Marland Kitchen aspirin 81 MG tablet Take 81 mg by mouth every other day.   . budesonide-formoterol (SYMBICORT) 160-4.5 MCG/ACT inhaler Inhale 2 puffs into the lungs 2 (two) times daily.  Marland Kitchen ezetimibe-simvastatin (VYTORIN) 10-40 MG tablet Take 1 tablet by mouth daily.  . fenofibrate 160 MG tablet TAKE 1 TABLET BY MOUTH EVERY DAY  . fish oil-omega-3 fatty acids 1000 MG capsule Take 2 g by mouth daily.  . metoprolol (LOPRESSOR) 50 MG tablet TAKE 1 TABLET BY MOUTH TWICE A DAY  . valsartan (DIOVAN) 160 MG tablet Take one half tablet daily  . [DISCONTINUED] mometasone-formoterol (DULERA) 200-5 MCG/ACT AERO Inhale 2 puffs into the lungs 2 (two) times  daily.  . [DISCONTINUED] amoxicillin (AMOXIL) 500 MG capsule Take 1 capsule (500 mg total) by mouth 3 (three) times daily.  . [DISCONTINUED] Budesonide-Formoterol Fumarate (SYMBICORT IN) Inhale 1 puff into the lungs 2 (two) times daily.   . [DISCONTINUED] HYDROcodone-homatropine (HYCODAN) 5-1.5 MG/5ML syrup Take one tsp prn cough  . [DISCONTINUED] Tiotropium Bromide Monohydrate (SPIRIVA RESPIMAT) 2.5 MCG/ACT AERS 2 puffs each am  . [DISCONTINUED] zolpidem (AMBIEN) 10 MG tablet TAKE 1 TABLET AT BEDTIME AS NEEDED SLEEP (Patient not taking: Reported on 02/28/2016)   No facility-administered encounter medications on file as of 02/28/2016.

## 2016-02-28 NOTE — Assessment & Plan Note (Signed)
-   08/13/2015  Walked RA x 3 laps @ 185 ft each stopped due to  End of study, nl pace, no sob or desat   - PFT's  10/01/2015  FEV1 1.91 (49 % ) ratio 51  p 29 % improvement from saba  With RV/TLC ratio 171% nl with DLCO  66 % corrects to 78 % for alv volume   - 10/01/2015    trial of symbicort 160 2 bid  - 10/25/2015    added spiriva respimat but pt stopped it  - 12/07/2015   Walked RA  2 laps @ 185 ft each stopped due to  Legs weak, min sob/ no desat at brisk pace > referred to rehab at [facility]   - 02/28/2016  p extensive coaching HFA effectiveness =    90%    I had an extended final summary discussion with the patient reviewing all relevant studies completed to date and  lasting 15 to 20 minutes of a 25 minute visit on the following issues:    1) clearly has improved though concerned he still doesn't understand how to take his meds  2) best approach is to simplify rx = just symbicort 160 2bid and prn saba   3) if losing ground on treadmill will need lama  4) Formulary restrictions will be an ongoing challenge for the forseable future and I would be happy to pick an alternative if the pt will first  provide me a list of them but pt  will need to return here for training for any new device that is required eg dpi vs hfa vs respimat.    In meantime we can always provide samples so the patient never runs out of any needed respiratory medications.   5) Each maintenance medication was reviewed in detail including most importantly the difference between maintenance and as needed and under what circumstances the prns are to be used.  Please see instructions for details which were reviewed in writing and the patient given a copy.   6) pulmonary f/u is prn

## 2016-02-28 NOTE — Patient Instructions (Addendum)
Plan A = Automatic = Symbicort 160 Take 2 puffs first thing in am and then another 2 puffs about 12 hours later   Plan B = Backup Only use your albuterol as a rescue medication to be used if you can't catch your breath by resting or doing a relaxed purse lip breathing pattern.  - The less you use it, the better it will work when you need it. - Ok to use up to 2 puffs  every 4 hours if you must but call for appointment if use goes up over your usual need - Don't leave home without it !!  (think of it like the spare tire for your car)     Keep up the walking on the treadmill and let me know if losing ground    Keep track of your blood pressure and let your doctor know if too high high or too low  In event of any cough>  Try prilosec otc 20mg   Take 30-60 min before first meal of the day and Pepcid ac (famotidine) 20 mg one @  bedtime until cough is completely gone for at least a week without the need for cough suppression  Best cough medication is deslym which is over the counter or mucinex dm 1200 mg every 12 hours as needed  GERD (REFLUX)  is an extremely common cause of respiratory symptoms just like yours , many times with no obvious heartburn at all.    It can be treated with medication, but also with lifestyle changes including elevation of the head of your bed (ideally with 6 inch  bed blocks),  Smoking cessation, avoidance of late meals, excessive alcohol, and avoid fatty foods, chocolate, peppermint, colas, red wine, and acidic juices such as orange juice.  NO MINT OR MENTHOL PRODUCTS SO NO COUGH DROPS  USE SUGARLESS CANDY INSTEAD (Jolley ranchers or Stover's or Life Savers) or even ice chips will also do - the key is to swallow to prevent all throat clearing. NO OIL BASED VITAMINS - use powdered substitutes.    If not better w/in 2 weeks return with all medications in hand

## 2016-02-28 NOTE — Assessment & Plan Note (Signed)
Trial off acei 08/13/2015 >  Improved 10/25/2015    Classic Upper airway cough syndrome, so named because it's frequently impossible to sort out how much is  CR/sinusitis with freq throat clearing (which can be related to primary GERD)   vs  causing  secondary (" extra esophageal")  GERD from wide swings in gastric pressure that occur with throat clearing, often  promoting self use of mint and menthol lozenges that reduce the lower esophageal sphincter tone and exacerbate the problem further in a cyclical fashion.   These are the same pts (now being labeled as having "irritable larynx syndrome" by some cough centers) who not infrequently have a history of having failed to tolerate ace inhibitors,  dry powder inhalers or biphosphonates or report having atypical reflux symptoms that don't respond to standard doses of PPI , and are easily confused as having aecopd or asthma flares by even experienced allergists/ pulmonologists.   Explained the natural history of uri and why it's necessary in patients at risk to treat GERD aggressively - at least  short term -   to reduce risk of evolving cyclical cough initially  triggered by epithelial injury and a heightened sensitivty to the effects of any upper airway irritants,  most importantly acid - related - then perpetuated by epithelial injury related to the cough itself as the upper airway collapses on itself.  That is, the more sensitive the epithelium becomes once it is damaged by the virus, the more the ensuing irritability> the more the cough, the more the secondary reflux (especially in those prone to reflux) the more the irritation of the sensitive mucosa and so on in a  Classic cyclical pattern.    If not responding to gerd rx p 2 weeks encouraged to make appt with all meds in hand to regroup - otherwise f/u is prn

## 2016-03-03 ENCOUNTER — Telehealth: Payer: Self-pay | Admitting: *Deleted

## 2016-03-03 NOTE — Telephone Encounter (Signed)
-----   Message from Tanda Rockers, MD sent at 02/28/2016  9:45 AM EDT ----- Ask him to scratch the dulera off his med list - I did not get that done before priniting, and document the conversation

## 2016-03-03 NOTE — Telephone Encounter (Signed)
LMTCB

## 2016-03-03 NOTE — Telephone Encounter (Signed)
Return call it is ok to leave msg w/wife.Dennis [name]

## 2016-03-03 NOTE — Telephone Encounter (Signed)
Spoke with pt's wife. She is aware of the below information. Nothing further was needed. 

## 2016-03-04 ENCOUNTER — Encounter (HOSPITAL_COMMUNITY)
Admission: RE | Admit: 2016-03-04 | Discharge: 2016-03-04 | Disposition: A | Discharge status: Home / Self Care | Payer: BLUE CROSS/BLUE SHIELD | Source: Ambulatory Visit | Attending: Internal Medicine | Admitting: Internal Medicine

## 2016-03-04 DIAGNOSIS — J449 Chronic obstructive pulmonary disease, unspecified: Secondary | ICD-10-CM | POA: Diagnosis not present

## 2016-03-06 ENCOUNTER — Encounter (HOSPITAL_COMMUNITY)
Admission: RE | Admit: 2016-03-06 | Discharge: 2016-03-06 | Disposition: A | Discharge status: Home / Self Care | Payer: BLUE CROSS/BLUE SHIELD | Source: Ambulatory Visit | Attending: Internal Medicine | Admitting: Internal Medicine

## 2016-03-06 DIAGNOSIS — J449 Chronic obstructive pulmonary disease, unspecified: Secondary | ICD-10-CM | POA: Diagnosis not present

## 2016-03-11 ENCOUNTER — Encounter (HOSPITAL_COMMUNITY)
Admission: RE | Admit: 2016-03-11 | Discharge: 2016-03-11 | Disposition: A | Discharge status: Home / Self Care | Payer: BLUE CROSS/BLUE SHIELD | Source: Ambulatory Visit | Attending: Internal Medicine | Admitting: Internal Medicine

## 2016-03-11 ENCOUNTER — Ambulatory Visit: Payer: BLUE CROSS/BLUE SHIELD | Admitting: Cardiology

## 2016-03-11 DIAGNOSIS — J449 Chronic obstructive pulmonary disease, unspecified: Secondary | ICD-10-CM | POA: Diagnosis not present

## 2016-03-13 ENCOUNTER — Encounter (HOSPITAL_COMMUNITY)
Admission: RE | Admit: 2016-03-13 | Discharge: 2016-03-13 | Disposition: A | Discharge status: Home / Self Care | Payer: BLUE CROSS/BLUE SHIELD | Source: Ambulatory Visit | Attending: Internal Medicine | Admitting: Internal Medicine

## 2016-03-13 DIAGNOSIS — J449 Chronic obstructive pulmonary disease, unspecified: Secondary | ICD-10-CM | POA: Diagnosis not present

## 2016-03-15 ENCOUNTER — Other Ambulatory Visit: Payer: Self-pay | Admitting: Family Medicine

## 2016-03-18 ENCOUNTER — Encounter (HOSPITAL_COMMUNITY): Payer: BLUE CROSS/BLUE SHIELD

## 2016-03-20 ENCOUNTER — Encounter (HOSPITAL_COMMUNITY): Payer: BLUE CROSS/BLUE SHIELD

## 2016-03-24 NOTE — Progress Notes (Addendum)
Dennis [name] 65 y.o. male 62 day Psychosocial Note   Patient psychosocial assessment reveals no barriers to participation in Pulmonary Rehab. Patient does feel he is making progress toward Pulmonary Rehab goals but slowly. Patient reports his health and activity level has not much improved in the past 30 days. Patient states family/friends have not noticed changes in his activity. Patient reports feeling positive about current and projected progression in Pulmonary Rehab. After reviewing the patient's treatment plan, the patient is making progress toward Pulmonary Rehab goals. Patient's rate of progress toward rehab goals is good. Plan of action to help patient continue to work towards rehab goals include encouragement, support and education. Will continue to monitor and evaluate progress toward psychosocial goal(s). Patient has lost approx 3 lbs since starting program.  Goal(s) in progress:  Help patient work toward returning to meaningful activities that improve patient's QOL and are attainable with patient's lung disease  Breathe better  Lose weight

## 2016-03-25 ENCOUNTER — Telehealth: Payer: Self-pay | Admitting: Cardiology

## 2016-03-25 ENCOUNTER — Encounter (HOSPITAL_COMMUNITY)
Admission: RE | Admit: 2016-03-25 | Discharge: 2016-03-25 | Disposition: A | Discharge status: Home / Self Care | Payer: BLUE CROSS/BLUE SHIELD | Source: Ambulatory Visit | Attending: Internal Medicine | Admitting: Internal Medicine

## 2016-03-25 DIAGNOSIS — J449 Chronic obstructive pulmonary disease, unspecified: Secondary | ICD-10-CM | POA: Diagnosis present

## 2016-03-25 NOTE — Telephone Encounter (Signed)
Pt would like to know if he can get some samples of ezetimibe-simvastatin (VYTORIN) 10-40 MG tablet [id number], he is in cardiac rehab and should be home around 12

## 2016-03-25 NOTE — Telephone Encounter (Signed)
Let pt know we do not carry samples of the Vytorin. That maybe he can try his PCP.

## 2016-03-27 ENCOUNTER — Other Ambulatory Visit: Payer: Self-pay | Admitting: Internal Medicine

## 2016-03-27 ENCOUNTER — Encounter (HOSPITAL_COMMUNITY): Payer: BLUE CROSS/BLUE SHIELD

## 2016-03-28 ENCOUNTER — Other Ambulatory Visit: Payer: Self-pay | Admitting: Internal Medicine

## 2016-04-01 ENCOUNTER — Encounter (HOSPITAL_COMMUNITY)
Admission: RE | Admit: 2016-04-01 | Discharge: 2016-04-01 | Disposition: A | Discharge status: Home / Self Care | Payer: BLUE CROSS/BLUE SHIELD | Source: Ambulatory Visit | Attending: Internal Medicine | Admitting: Internal Medicine

## 2016-04-01 DIAGNOSIS — J449 Chronic obstructive pulmonary disease, unspecified: Secondary | ICD-10-CM | POA: Diagnosis not present

## 2016-04-03 ENCOUNTER — Encounter (HOSPITAL_COMMUNITY): Payer: BLUE CROSS/BLUE SHIELD

## 2016-04-08 ENCOUNTER — Encounter (HOSPITAL_COMMUNITY)
Admission: RE | Admit: 2016-04-08 | Discharge: 2016-04-08 | Disposition: A | Discharge status: Home / Self Care | Payer: BLUE CROSS/BLUE SHIELD | Source: Ambulatory Visit | Attending: Internal Medicine | Admitting: Internal Medicine

## 2016-04-08 DIAGNOSIS — J449 Chronic obstructive pulmonary disease, unspecified: Secondary | ICD-10-CM | POA: Diagnosis not present

## 2016-04-09 ENCOUNTER — Telehealth: Payer: Self-pay | Admitting: *Deleted

## 2016-04-09 LAB — LIPID PANEL
Chol/HDL Ratio: 6.5 ratio units — ABNORMAL HIGH (ref 0.0–5.0)
Cholesterol, Total: 275 mg/dL — ABNORMAL HIGH (ref 100–199)
HDL: 42 mg/dL (ref >39)
LDL Calculated: 185 mg/dL — ABNORMAL HIGH (ref 0–99)
Triglycerides: 242 mg/dL — ABNORMAL HIGH (ref 0–149)
VLDL Cholesterol Cal: 48 mg/dL — ABNORMAL HIGH (ref 5–40)

## 2016-04-09 LAB — HEPATIC FUNCTION PANEL
ALK PHOS: 32 IU/L — AB (ref 39–117)
ALT: 22 IU/L (ref 0–44)
AST: 28 IU/L (ref 0–40)
Albumin: 4.5 g/dL (ref 3.6–4.8)
BILIRUBIN, DIRECT: 0.23 mg/dL (ref 0.00–0.40)
Bilirubin Total: 0.7 mg/dL (ref 0.0–1.2)
TOTAL PROTEIN: 6.9 g/dL (ref 6.0–8.5)

## 2016-04-09 NOTE — Telephone Encounter (Signed)
Initiated PA for Symbicort thru CMM. Key: [place] for review.    [id number] - [place], [place] [phone] (Phone) [phone] (Fax)

## 2016-04-10 ENCOUNTER — Encounter (HOSPITAL_COMMUNITY): Payer: BLUE CROSS/BLUE SHIELD

## 2016-04-11 NOTE — Telephone Encounter (Signed)
Symbicort denied. No alternatives given.

## 2016-04-11 NOTE — Telephone Encounter (Signed)
Try advai 115 2bid

## 2016-04-11 NOTE — Progress Notes (Signed)
Dennis [name] 65 y.o. male               120 day Psychosocial Note  Patient psychosocial assessment reveals one barrier to participation in Pulmonary Rehab. Psychosocial areas that are currently affecting patient's rehab experience include dealing with partner.  Wife is sick and has to miss class sometimes to take care of her.  Patient still does not present with any s/s of depression and is joyful in class.   Patient does continue to exhibit positive coping skills to deal with his psychosocial concerns. Offered emotional support and reassurance. Patient does feel he is making progress toward Pulmonary Rehab goals. Patient reports his health and activity level has improved in the past 30 days as evidenced by patient's report of increased strength. Patient states family/friends have not noticed changes in his activity or mood. Patient reports feeling positive about current and projected progression in Pulmonary Rehab. After reviewing the patient's treatment plan, the patient is making progress toward Pulmonary Rehab goals. Patient's rate of progress toward rehab goals is good. Plan of action to help patient continue to work towards rehab goals include support, education and encouragement.  Will continue to monitor and evaluate progress toward psychosocial goal(s).  Goal(s) in progress: Help patient work toward returning to meaningful activities that improve patient's QOL and are attainable with patient's lung disease Breathe better Lose weight

## 2016-04-14 NOTE — Telephone Encounter (Signed)
Attempted to contact pt. Line was busy. Will try back. 

## 2016-04-14 NOTE — Telephone Encounter (Signed)
Triage, because this is simply notifying the patient of a change in therapy and sending rx please take care of this.  Thank you.

## 2016-04-15 ENCOUNTER — Encounter (HOSPITAL_COMMUNITY): Payer: BLUE CROSS/BLUE SHIELD

## 2016-04-17 ENCOUNTER — Encounter (HOSPITAL_COMMUNITY)
Admission: RE | Admit: 2016-04-17 | Discharge: 2016-04-17 | Disposition: A | Discharge status: Home / Self Care | Payer: BLUE CROSS/BLUE SHIELD | Source: Ambulatory Visit | Attending: Internal Medicine | Admitting: Internal Medicine

## 2016-04-17 DIAGNOSIS — J449 Chronic obstructive pulmonary disease, unspecified: Secondary | ICD-10-CM | POA: Diagnosis not present

## 2016-04-17 NOTE — Progress Notes (Signed)
Daily Session Note  Patient Details  Name: Dennis [name] MRN: [id number] Date of Birth: 11-Feb-1951 Referring Provider:    Encounter Date: 04/17/2016  Check In:     Session Check In - 04/17/16 1045    Check-In   Location [facility]   Staff Present [place] Angelina Pih, MS, EP, [facility], Exercise Physiologist;Other  Nils Flack, EP, Reina Fuse, BSN   Supervising physician immediately available to respond to emergencies See telemetry face sheet for immediately available MD   Medication changes reported     No   Fall or balance concerns reported    No   Warm-up and Cool-down Performed as group-led instruction   Resistance Training Performed Yes   VAD Patient? No   Pain Assessment   Currently in Pain? No/denies   Pain Score 0-No pain   Multiple Pain Sites No      Capillary Blood Glucose: No results found for this or any previous visit (from the past 24 hour(s)).   Goals Met:  Proper associated with RPD/PD & O2 Sat Independence with exercise equipment Improved SOB with ADL's Exercise tolerated well No report of cardiac concerns or symptoms Strength training completed today  Goals Unmet:  RPE PD BP HR O2 Sat  Comments: Patient is progressing appropriately. Check out: 12pm   Dr. Kate Sable is Medical Director for [place] and Pulmonary Rehab.

## 2016-04-22 ENCOUNTER — Encounter (HOSPITAL_COMMUNITY)
Admission: RE | Admit: 2016-04-22 | Discharge: 2016-04-22 | Disposition: A | Discharge status: Home / Self Care | Payer: BLUE CROSS/BLUE SHIELD | Source: Ambulatory Visit | Attending: Internal Medicine | Admitting: Internal Medicine

## 2016-04-22 DIAGNOSIS — J449 Chronic obstructive pulmonary disease, unspecified: Secondary | ICD-10-CM | POA: Diagnosis not present

## 2016-04-22 NOTE — Progress Notes (Signed)
Daily Session Note  Patient Details  Name: Dennis [name] MRN: [id number] Date of Birth: Sep 23, 1951 Referring Provider:    Encounter Date: 04/22/2016  Check In:     Session Check In - 04/22/16 1045    Check-In   Location [facility]   Staff Present Jerimey Burridge Angelina Pih, MS, EP, [facility], Exercise Physiologist;Other  Nils Flack, EP   Supervising physician immediately available to respond to emergencies See telemetry face sheet for immediately available MD   Medication changes reported     No   Fall or balance concerns reported    No   Warm-up and Cool-down Performed as group-led instruction   Resistance Training Performed Yes   VAD Patient? No   Pain Assessment   Currently in Pain? No/denies   Multiple Pain Sites No      Capillary Blood Glucose: No results found for this or any previous visit (from the past 24 hour(s)).   Goals Met:  Proper associated with RPD/PD & O2 Sat Independence with exercise equipment Improved SOB with ADL's Using PLB without cueing & demonstrates good technique Exercise tolerated well Queuing for purse lip breathing No report of cardiac concerns or symptoms Strength training completed today  Goals Unmet:  RPE PD  Comments: Check out: 11:45   Dr. Kate Sable is Medical Director for [place] and Pulmonary Rehab.

## 2016-04-24 ENCOUNTER — Encounter (HOSPITAL_COMMUNITY): Payer: BLUE CROSS/BLUE SHIELD

## 2016-04-29 ENCOUNTER — Encounter (HOSPITAL_COMMUNITY)
Admission: RE | Admit: 2016-04-29 | Discharge: 2016-04-29 | Disposition: A | Discharge status: Home / Self Care | Payer: BLUE CROSS/BLUE SHIELD | Source: Ambulatory Visit | Attending: Internal Medicine | Admitting: Internal Medicine

## 2016-04-29 DIAGNOSIS — J449 Chronic obstructive pulmonary disease, unspecified: Secondary | ICD-10-CM | POA: Diagnosis present

## 2016-05-01 ENCOUNTER — Encounter (HOSPITAL_COMMUNITY)
Admission: RE | Admit: 2016-05-01 | Payer: BLUE CROSS/BLUE SHIELD | Source: Ambulatory Visit | Attending: Internal Medicine | Admitting: Internal Medicine

## 2016-05-06 ENCOUNTER — Encounter (HOSPITAL_COMMUNITY)
Admission: RE | Admit: 2016-05-06 | Discharge: 2016-05-06 | Disposition: A | Discharge status: Home / Self Care | Payer: BLUE CROSS/BLUE SHIELD | Source: Ambulatory Visit | Attending: Internal Medicine | Admitting: Internal Medicine

## 2016-05-06 DIAGNOSIS — J449 Chronic obstructive pulmonary disease, unspecified: Secondary | ICD-10-CM | POA: Diagnosis not present

## 2016-05-08 ENCOUNTER — Ambulatory Visit (INDEPENDENT_AMBULATORY_CARE_PROVIDER_SITE_OTHER): Payer: BLUE CROSS/BLUE SHIELD | Admitting: Family Medicine

## 2016-05-08 ENCOUNTER — Encounter: Payer: Self-pay | Admitting: Family Medicine

## 2016-05-08 VITALS — BP 110/80 | Ht 73.0 in | Wt 214.0 lb

## 2016-05-08 DIAGNOSIS — R7301 Impaired fasting glucose: Secondary | ICD-10-CM | POA: Diagnosis not present

## 2016-05-08 DIAGNOSIS — I10 Essential (primary) hypertension: Secondary | ICD-10-CM

## 2016-05-08 DIAGNOSIS — I483 Typical atrial flutter: Secondary | ICD-10-CM

## 2016-05-08 NOTE — Progress Notes (Signed)
   Subjective:    Patient ID: Dennis [name], male    DOB: January 18, 1951, 65 y.o.   MRN: [id number]  Hypertension This is a chronic problem. The current episode started more than 1 year ago. The problem has been gradually improving since onset. There are no associated agents to hypertension. There are no known risk factors for coronary artery disease. Treatments tried: metoprolol, valsartan. The current treatment provides moderate improvement. There are no compliance problems.    Patient has no new concerns at this time.   Breathing holding its own, tue last day in rehab,  24 essions, saw a big improvement,''  Most morn takes and helps    crestor did not work  Pt got to worried and got back on the generic brand of vytorin, just went   Pt want s to stay on it a few months, pt due to r ck soon   Patient claims compliance with blood pressure medication. No obvious side effects. Blood pressures good when checked elsewhere. Salt intake reviewed.  Breathing overall better after pulmonary rehabilitation exercise class. Patient plans to maintain this. Using Symbicort faithfully. With when necessary albuterol. Next  Patient notes that he did go ahead and receive disability determination fortunately  Review of Systems No headache, no major weight loss or weight gain, no chest pain no back pain abdominal pain no change in bowel habits complete ROS otherwise negative     Objective:   Physical Exam Alert vital stable blood pressure improved on repeat HEENT normal lungs breath sounds diffusely diminished. Heart regular in rhythm. Abdomen benign extremities trace edema       Assessment & Plan:  Impression hypertension improved discussed maintain same #2 COPD improved discussed maintain same approach exercise vomiting. #3 hyperlipidemia discussed at length. Patient now trying generic Vytorin. He hopes out of his best choice. Work on diet like to reassess in a couple months plan return than blood  work diet exercise discussed WSL

## 2016-05-13 NOTE — Progress Notes (Signed)
.  xxap

## 2016-05-18 NOTE — Progress Notes (Signed)
Patient graduated from [facility] today on 05/06/16 after completing 24 sessions. He achieved LTG of 30 minutes of aerobic exercise at Max Met level of 2.95. All patients vitals are WNL. Patient has not met with dietician. Discharge instruction has been reviewed in detail and patient stated an understanding of material given. Patient plans to exercise at home 3 x week and come to [employer] Maintenance 2 x week her at [facility]. Cardiac and Pulmonary Rehab staff will make f/u calls at 1 month, 6 months, and 1 year. Patient had no complaints of any abnormal S/S or pain on their exit visit.

## 2016-05-18 NOTE — Progress Notes (Signed)
[facility] Pulmonary Rehabilitation                                                             Final/Discharge Outcome Results  Anthropometrics: . Height (inches): 73 . Weight (kg): 97.7 ? This is a change of -1.3 from entrance. . Grip strength was measured using a Dynamometer.  The patient's discharge score was a 108.6.   ? This is a change of -1.27 from entrance.  Functional Status/Exercise Capacity: . Maxi had a resting heart rate of 73 BPM, a resting blood pressure of 140/88, and an oxygen saturation of 96 % on 0 liters of O2.  Wilder performed a discharge 6-minute walk test on 05/06/16.  The patient completed 1350 feet in 6 minutes with 0 rest breaks.  This quantifies 2.95 METS. The patient's goal is to add 82 feet onto the baseline 6MWT.   ? The patient increased their 6-minute walk test distance by 50=3.55% feet and their MET level by 2.07 METs.  Dyspnea Measures: . The [facility] is a simple and standardized method of classifying disability in patients with COPD.  The assessment correlates disability and dyspnea.  Upon discharge the patients resting score was 3. The scale is provided below.  ? This is a change of 0 from entrance.  0= I only get breathless with strenuous exercise. 1= I get short of breath when hurrying on level ground or walking up a slight incline. 2= On level ground, I walk slower than people of the same age because of breathlessness, or have to stop for breath when walking at my own pace. 3= I stop for breath after walking 100 yards or after a few minutes on level ground. 4=I am too breathless to leave the house or I am breathless when dressing.   . The patient completed the [place] ([facility]).  This questionnaire relates activities of daily living and shortness of breath.  The score ranges from 0-120, a higher score relates to severe shortness of breath during  activities of daily living. The patient's score at discharge was 73. ? This is a change of 4.28 from entrance.  Quality of Life: . Ferrans and Powers Quality of Life Index Pulmonary Version is used to assess the patients satisfaction in different domains of their life; health and functioning, socioeconomic, psychological/spiritual, and family. The overall score is recorded out of 30 points.  The patient's goal is to achieve an overall score of 21 or higher.  Grainger received a 18.80 upon discharge.  ? This is a change of -10.83 from entrance.  . The Patient Health Questionnaire (PHQ-2) is a first step approach for the screening of depression.  If the patient scores positive on the PHQ-2 the patient should be further assessed with the PHQ-9.  The Patient Health Questionnaire (PHQ-9) assesses the degree of depression.  Depression is important to monitor and track in pulmonary patients due to its prevalence in the population.  If the patient advances to the PHQ-9 the goal is to score less than 4 on this assessment.  Mathan scored a 0 at discharge. ? This is a change of 100% from entrance.   Clinical Assessment Tools: . The COPD Assessment Test (CAT) is a measurement tool to quantify how  much of an impact the disease has on the patient's life.  This assessment aids the Pulmonary Rehab Team in designing the patients individualized treatment plan.  A CAT score ranges from 0-40.  A score of 10 or below indicates that COPD has a low impact on the patient's life whereas a score of 30 or higher indicates a severe impact. The patient's goal is a decrease of 1 point from entrance to discharge.  Odin had a CAT score of 25 upon discharge.   ? This is a change of 25 from entrance.  Nutrition: . The "Rate My Plate" is a dietary assessment that quantifies the balance of a patient's diet.  This tool allows the Pulmonary Rehab Team to key in on the areas of the patient's diet that needs improving.  The team can then  focus their nutritional education on those areas.  If the patient scores 24-40, this means there are many ways they can make their eating habits healthier, 41-57 states that there are some ways they can make their eating habits healthier and a score of 58-72 states that they are making many healthy choices.  The patient's goal is to achieve a score of 49 or higher on this assessment.  Garen scored a 53 upon discharge.   ? This is a change of 6 from entrance.  Oxygen Compliance: . Patient is currently on 0 liters at rest, 0 liters at night, and 0 liters for exercise.  Rogelio is currently not using cpap/bipap at night.  The patient is currently n/a compliant/noncompliant.  The patient states that they do not have barriers that keep them from using their oxygen.  These barriers include none.   ? This is a no change result from entrance.    Education: . Haron attended 7/13 education classes.  Wilma completed a discharge educational assessment and achieved a score of 13/14.  ? This is a change of 92.9 from entrance.   Smoking Cessation:  Patient does not smoke currently.  Exercise: . Kaniel was provided with an individualized Home Exercise Prescription (HEP) at the entrance of the program.  The patient's goal is to be exercising 30-60 minutes, 3-5 days per week. Upon discharge the patient is exercising 3 days at home.  This is a change of 0 from entrance.  After graduation from Pulmonary Rehab, Darious will continue exercising at home 3 x wee and 2 x week in our [employer] program. .

## 2016-05-20 ENCOUNTER — Other Ambulatory Visit: Payer: Self-pay | Admitting: Family Medicine

## 2016-05-23 NOTE — Telephone Encounter (Signed)
Pt obtained free 12 month supply card and is taking Symbocort as directed. Nothing further needed.

## 2016-05-23 NOTE — Telephone Encounter (Signed)
[phone] pt calling back pt has a 12 month free supply

## 2016-05-23 NOTE — Telephone Encounter (Signed)
LM for patient x 1 to return our call regarding his medication. Patient cancelled his follow up appt and this looks like it was never followed up on. Advair was never called into the pharmacy nor was the patient spoken with about this change. Is the patient out of medication?  Will hold in triage to follow up.

## 2016-06-27 ENCOUNTER — Other Ambulatory Visit: Payer: Self-pay | Admitting: Family Medicine

## 2016-06-30 ENCOUNTER — Encounter: Payer: Self-pay | Admitting: Family Medicine

## 2016-06-30 ENCOUNTER — Telehealth: Payer: Self-pay | Admitting: Family Medicine

## 2016-06-30 ENCOUNTER — Ambulatory Visit (INDEPENDENT_AMBULATORY_CARE_PROVIDER_SITE_OTHER): Payer: BLUE CROSS/BLUE SHIELD | Admitting: Family Medicine

## 2016-06-30 VITALS — BP 122/84 | Temp 97.6°F | Ht 73.0 in | Wt 218.6 lb

## 2016-06-30 DIAGNOSIS — Z125 Encounter for screening for malignant neoplasm of prostate: Secondary | ICD-10-CM

## 2016-06-30 DIAGNOSIS — I10 Essential (primary) hypertension: Secondary | ICD-10-CM

## 2016-06-30 DIAGNOSIS — Z79899 Other long term (current) drug therapy: Secondary | ICD-10-CM

## 2016-06-30 DIAGNOSIS — J441 Chronic obstructive pulmonary disease with (acute) exacerbation: Secondary | ICD-10-CM

## 2016-06-30 DIAGNOSIS — J019 Acute sinusitis, unspecified: Secondary | ICD-10-CM | POA: Diagnosis not present

## 2016-06-30 DIAGNOSIS — E785 Hyperlipidemia, unspecified: Secondary | ICD-10-CM

## 2016-06-30 MED ORDER — HYDROCODONE-HOMATROPINE 5-1.5 MG/5ML PO SYRP: 5.0000 mL | ORAL_SOLUTION | Freq: Four times a day (QID) | ORAL | 0 refills | Status: DC | PRN

## 2016-06-30 MED ORDER — ALBUTEROL SULFATE HFA 108 (90 BASE) MCG/ACT IN AERS: 2.0000 | INHALATION_SPRAY | RESPIRATORY_TRACT | 12 refills | Status: DC | PRN

## 2016-06-30 MED ORDER — AMOXICILLIN-POT CLAVULANATE 875-125 MG PO TABS: 1.0000 | ORAL_TABLET | Freq: Two times a day (BID) | ORAL | 0 refills | Status: DC

## 2016-06-30 MED ORDER — ALBUTEROL SULFATE HFA 108 (90 BASE) MCG/ACT IN AERS
2.0000 | INHALATION_SPRAY | RESPIRATORY_TRACT | 12 refills | Status: DC | PRN
Start: 2016-06-30 — End: 2018-01-26

## 2016-06-30 MED ORDER — PREDNISONE 20 MG PO TABS: ORAL_TABLET | ORAL | 0 refills | Status: DC

## 2016-06-30 NOTE — Telephone Encounter (Signed)
bw orders please    Last labs 04/08/16  Hep, Lip  PSA done 02/09/15

## 2016-06-30 NOTE — Telephone Encounter (Signed)
bloodwork orders ready. Pt notified.  

## 2016-06-30 NOTE — Progress Notes (Signed)
   Subjective:    Patient ID: Dennis [name], male    DOB: 1951/09/16, 65 y.o.   MRN: [id number]  Cough  This is a new problem. The current episode started in the past 7 days. Associated symptoms include nasal congestion and rhinorrhea. Pertinent negatives include no chest pain, ear pain, fever or wheezing.   Patient relates several days head congestion drainage coughing now with chest congestion denies high fever chills does get a little short of breath with activity denies any other particular troubles   Review of Systems  Constitutional: Negative for activity change and fever.  HENT: Positive for congestion and rhinorrhea. Negative for ear pain.   Eyes: Negative for discharge.  Respiratory: Positive for cough. Negative for wheezing.   Cardiovascular: Negative for chest pain.       Objective:   Physical Exam  Constitutional: He appears well-developed.  HENT:  Head: Normocephalic.  Mouth/Throat: Oropharynx is clear and moist. No oropharyngeal exudate.  Neck: Normal range of motion.  Cardiovascular: Normal rate, regular rhythm and normal heart sounds.   No murmur heard. Pulmonary/Chest: Effort normal and breath sounds normal. He has no wheezes.  Lymphadenopathy:    He has no cervical adenopathy.  Neurological: He exhibits normal muscle tone.  Skin: Skin is warm and dry.  Nursing note and vitals reviewed.         Assessment & Plan:  COPD exacerbation Acute rhinosinusitis Possible allergy component No sign of hypoxia O2 saturation 94% Treatment with antibiotics, Hycodan as needed for cough suppression caution drowsiness, prednisone taper, continuing steroid inhaler on a regular basis a  And albuterol inhaler on an as-needed basis warning signs were discussed. Follow-up if problems Flu vaccine this fall

## 2016-06-30 NOTE — Telephone Encounter (Signed)
Lipid, liver, metabolic 7, PSA-please order under Dr. Richardson Landry name

## 2016-07-04 LAB — BASIC METABOLIC PANEL
BUN/Creatinine Ratio: 12 (ref 10–24)
BUN: 17 mg/dL (ref 8–27)
CALCIUM: 9.7 mg/dL (ref 8.6–10.2)
CHLORIDE: 96 mmol/L (ref 96–106)
CO2: 25 mmol/L (ref 18–29)
Creatinine, Ser: 1.41 mg/dL — ABNORMAL HIGH (ref 0.76–1.27)
GFR calc Af Amer: 61 mL/min/{1.73_m2} (ref >59)
GFR calc non Af Amer: 53 mL/min/{1.73_m2} — ABNORMAL LOW (ref >59)
GLUCOSE: 101 mg/dL — AB (ref 65–99)
POTASSIUM: 4.8 mmol/L (ref 3.5–5.2)
Sodium: 136 mmol/L (ref 134–144)

## 2016-07-04 LAB — PSA: PROSTATE SPECIFIC AG, SERUM: 0.8 ng/mL (ref 0.0–4.0)

## 2016-07-04 LAB — LIPID PANEL
CHOL/HDL RATIO: 7 ratio — AB (ref 0.0–5.0)
CHOLESTEROL TOTAL: 272 mg/dL — AB (ref 100–199)
HDL: 39 mg/dL — ABNORMAL LOW (ref >39)
LDL CALC: 185 mg/dL — AB (ref 0–99)
TRIGLYCERIDES: 242 mg/dL — AB (ref 0–149)
VLDL CHOLESTEROL CAL: 48 mg/dL — AB (ref 5–40)

## 2016-07-04 LAB — HEPATIC FUNCTION PANEL
ALT: 26 IU/L (ref 0–44)
AST: 39 IU/L (ref 0–40)
Albumin: 4.4 g/dL (ref 3.6–4.8)
Alkaline Phosphatase: 29 IU/L — ABNORMAL LOW (ref 39–117)
Bilirubin Total: 0.7 mg/dL (ref 0.0–1.2)
Bilirubin, Direct: 0.26 mg/dL (ref 0.00–0.40)
Total Protein: 6.9 g/dL (ref 6.0–8.5)

## 2016-07-08 ENCOUNTER — Ambulatory Visit (INDEPENDENT_AMBULATORY_CARE_PROVIDER_SITE_OTHER): Payer: BLUE CROSS/BLUE SHIELD | Admitting: Family Medicine

## 2016-07-08 ENCOUNTER — Encounter: Payer: Self-pay | Admitting: Family Medicine

## 2016-07-08 VITALS — BP 120/84 | Ht 73.0 in | Wt 215.4 lb

## 2016-07-08 DIAGNOSIS — I10 Essential (primary) hypertension: Secondary | ICD-10-CM | POA: Diagnosis not present

## 2016-07-08 DIAGNOSIS — E785 Hyperlipidemia, unspecified: Secondary | ICD-10-CM

## 2016-07-08 DIAGNOSIS — Z0000 Encounter for general adult medical examination without abnormal findings: Secondary | ICD-10-CM | POA: Diagnosis not present

## 2016-07-08 NOTE — Progress Notes (Signed)
Subjective:    Patient ID: Dennis [name], male    DOB: Jun 19, 1951, 65 y.o.   MRN: [id number]  Hyperlipidemia  This is a chronic problem. The current episode started more than 1 year ago. Pertinent negatives include no chest pain. There are no compliance problems.    Patient would like to discuss Vytorin and would like to have prostate examined.   Patient continues to take lipid medication regularly. No obvious side effects from it. Generally does not miss a dose. Prior blood work results are reviewed with patient. Patient continues to work on fat intake in diet Results for orders placed or performed in visit on 06/30/16  Lipid panel  Result Value Ref Range   Cholesterol, Total 272 (H) 100 - 199 mg/dL   Triglycerides 242 (H) 0 - 149 mg/dL   HDL 39 (L) >39 mg/dL   VLDL Cholesterol Cal 48 (H) 5 - 40 mg/dL   LDL Calculated 185 (H) 0 - 99 mg/dL   Chol/HDL Ratio 7.0 (H) 0.0 - 5.0 ratio units  Hepatic function panel  Result Value Ref Range   Total Protein 6.9 6.0 - 8.5 g/dL   Albumin 4.4 3.6 - 4.8 g/dL   Bilirubin Total 0.7 0.0 - 1.2 mg/dL   Bilirubin, Direct 0.26 0.00 - 0.40 mg/dL   Alkaline Phosphatase 29 (L) 39 - 117 IU/L   AST 39 0 - 40 IU/L   ALT 26 0 - 44 IU/L  Basic metabolic panel  Result Value Ref Range   Glucose 101 (H) 65 - 99 mg/dL   BUN 17 8 - 27 mg/dL   Creatinine, Ser 1.41 (H) 0.76 - 1.27 mg/dL   GFR calc non Af Amer 53 (L) >59 mL/min/1.73   GFR calc Af Amer 61 >59 mL/min/1.73   BUN/Creatinine Ratio 12 10 - 24   Sodium 136 134 - 144 mmol/L   Potassium 4.8 3.5 - 5.2 mmol/L   Chloride 96 96 - 106 mmol/L   CO2 25 18 - 29 mmol/L   Calcium 9.7 8.6 - 10.2 mg/dL  PSA  Result Value Ref Range   Prostate Specific Ag, Serum 0.8 0.0 - 4.0 ng/mL   Patient continues to take lipid medication regularly. No obvious side effects from it. Generally does not miss a dose. Prior blood work results are reviewed with patient. Patient continues to work on fat intake in diet  Patient  also arise for his wellness exam.  Continue to participate in exercise particularly under guidance of pulmonary rehabilitation.  States up to date on colonoscopy, this is reviewed.  Patient doing his best in terms of diet these days. Overall watching things fairly closely.  Good compliance with medications.  Aware that flu shot will be to and October  Blood pressure medicine and blood pressure levels reviewed today with patient. Compliant with blood pressure medicine. States does not miss a dose. No obvious side effects. Blood pressure generally good when checked elsewhere. Watching salt intake.     Review of Systems  Constitutional: Negative.  Negative for activity change, appetite change and fever.  HENT: Negative for congestion and rhinorrhea.   Eyes: Negative for discharge.  Respiratory: Negative for cough and wheezing.   Cardiovascular: Negative for chest pain.  Gastrointestinal: Negative for abdominal pain, blood in stool and vomiting.  Genitourinary: Negative for difficulty urinating and frequency.  Musculoskeletal: Negative for neck pain.  Skin: Negative for rash.  Allergic/Immunologic: Negative for environmental allergies and food allergies.  Neurological: Negative for weakness and  headaches.  Psychiatric/Behavioral: Negative for agitation.  All other systems reviewed and are negative.      Objective:   Physical Exam  Constitutional: He appears well-developed and well-nourished.  HENT:  Head: Normocephalic and atraumatic.  Right Ear: External ear normal.  Left Ear: External ear normal.  Nose: Nose normal.  Mouth/Throat: Oropharynx is clear and moist.  Eyes: EOM are normal. Pupils are equal, round, and reactive to light.  Neck: Normal range of motion. Neck supple. No thyromegaly present.  Cardiovascular: Normal rate, regular rhythm and normal heart sounds.   No murmur heard. Pulmonary/Chest: Effort normal and breath sounds normal. No respiratory distress. He has  no wheezes.  Abdominal: Soft. Bowel sounds are normal. He exhibits no distension and no mass. There is no tenderness.  Genitourinary: Penis normal.  Musculoskeletal: Normal range of motion. He exhibits no edema.  Lymphadenopathy:    He has no cervical adenopathy.  Neurological: He is alert. He exhibits normal muscle tone.  Skin: Skin is warm and dry. No erythema.  Psychiatric: He has a normal mood and affect. His behavior is normal. Judgment normal.  Vitals reviewed.         Assessment & Plan:  Impression 1 wellness exam. Diet exercise discussed. Anticipatory guidance given. Screening test discussed #2 COPD fairly substantial. Importance of compliance exercise discussed #3 hyperlipidemia. Suboptimum but the best we can do for this patient considering past reactions. Discussed at length #4 coronary artery disease plan medications refilled. Diet exercise discussed. Follow-up visit as scheduled. Blood work reviewed. WSL

## 2016-07-08 NOTE — Patient Instructions (Addendum)
Results for orders placed or performed in visit on 06/30/16  Lipid panel  Result Value Ref Range   Cholesterol, Total 272 (H) 100 - 199 mg/dL   Triglycerides 242 (H) 0 - 149 mg/dL   HDL 39 (L) >39 mg/dL   VLDL Cholesterol Cal 48 (H) 5 - 40 mg/dL   LDL Calculated 185 (H) 0 - 99 mg/dL   Chol/HDL Ratio 7.0 (H) 0.0 - 5.0 ratio units  Hepatic function panel  Result Value Ref Range   Total Protein 6.9 6.0 - 8.5 g/dL   Albumin 4.4 3.6 - 4.8 g/dL   Bilirubin Total 0.7 0.0 - 1.2 mg/dL   Bilirubin, Direct 0.26 0.00 - 0.40 mg/dL   Alkaline Phosphatase 29 (L) 39 - 117 IU/L   AST 39 0 - 40 IU/L   ALT 26 0 - 44 IU/L  Basic metabolic panel  Result Value Ref Range   Glucose 101 (H) 65 - 99 mg/dL   BUN 17 8 - 27 mg/dL   Creatinine, Ser 1.41 (H) 0.76 - 1.27 mg/dL   GFR calc non Af Amer 53 (L) >59 mL/min/1.73   GFR calc Af Amer 61 >59 mL/min/1.73   BUN/Creatinine Ratio 12 10 - 24   Sodium 136 134 - 144 mmol/L   Potassium 4.8 3.5 - 5.2 mmol/L   Chloride 96 96 - 106 mmol/L   CO2 25 18 - 29 mmol/L   Calcium 9.7 8.6 - 10.2 mg/dL  PSA  Result Value Ref Range   Prostate Specific Ag, Serum 0.8 0.0 - 4.0 ng/mL   Please call in early October for a flu shot

## 2016-07-10 ENCOUNTER — Other Ambulatory Visit: Payer: Self-pay | Admitting: Family Medicine

## 2016-07-25 ENCOUNTER — Other Ambulatory Visit: Payer: Self-pay | Admitting: Family Medicine

## 2016-07-25 NOTE — Telephone Encounter (Signed)
Six mo worth 

## 2016-09-16 ENCOUNTER — Other Ambulatory Visit: Payer: Self-pay | Admitting: Family Medicine

## 2016-09-22 ENCOUNTER — Telehealth: Payer: Self-pay | Admitting: Internal Medicine

## 2016-09-22 NOTE — Telephone Encounter (Signed)
Rec'd form for extension for coverage for disability via fax. Sent to [employer] via interoffice mail -pr

## 2016-09-26 IMAGING — MR MR LUMBAR SPINE W/O CM
4 of 5 series · 15 of 48 positions shown · non-contrast
Comparison: 04/06/2014 CT abdomen and pelvis.  No comparison MR.

CLINICAL DATA: 63-year-old male with low back pain and left hip
pain for the past 18 months. No known injury.

Initial encounter.
EXAM:
MRI LUMBAR SPINE WITHOUT CONTRAST
TECHNIQUE: Multiplanar, multisequence MR imaging of the lumbar spine was
performed. No intravenous contrast was administered.

[Series 3: T2 · sagittal · 4.0mm · 0.84mm/px · 6 of 15 slices shown (1 of 2)]
[im 1/15]
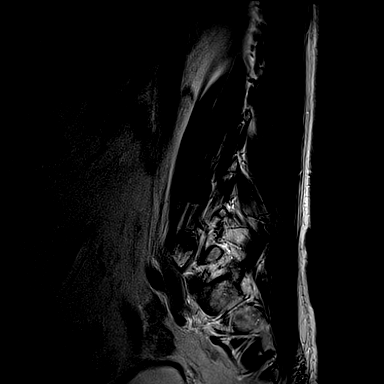
[im 3/15]
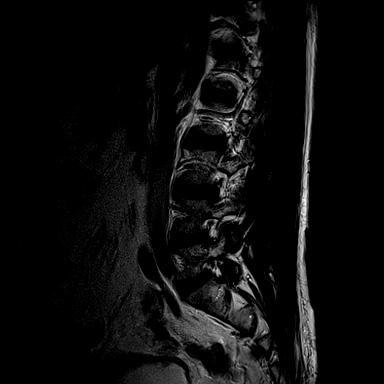
[im 6/15]
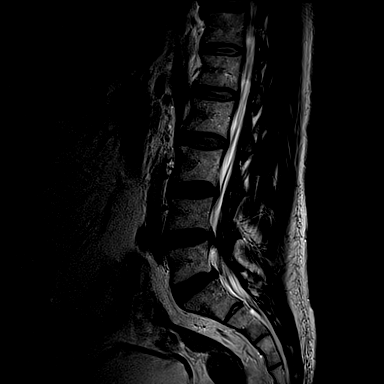
[im 9/15]
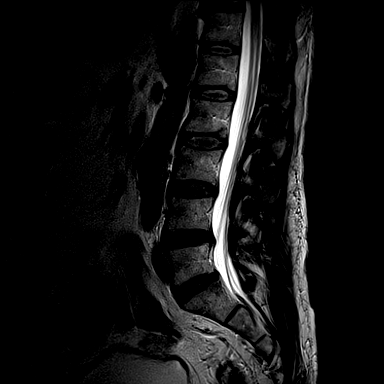
[im 12/15]
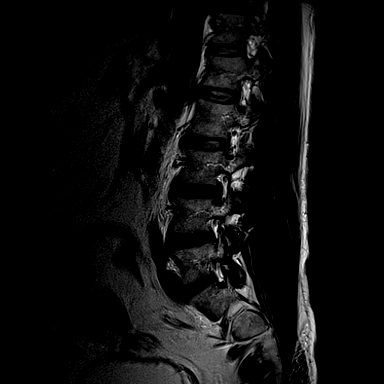
[im 15/15]
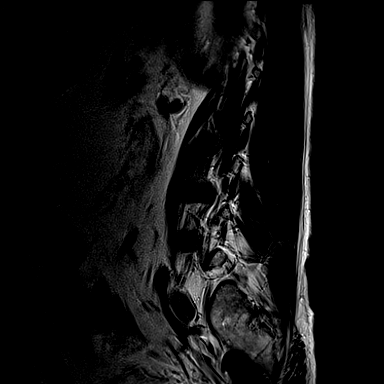

[Series 4: T1 · sagittal · 4.0mm · 0.42mm/px · 3 of 15 slices shown (1 of 2)]
[im 1/15]
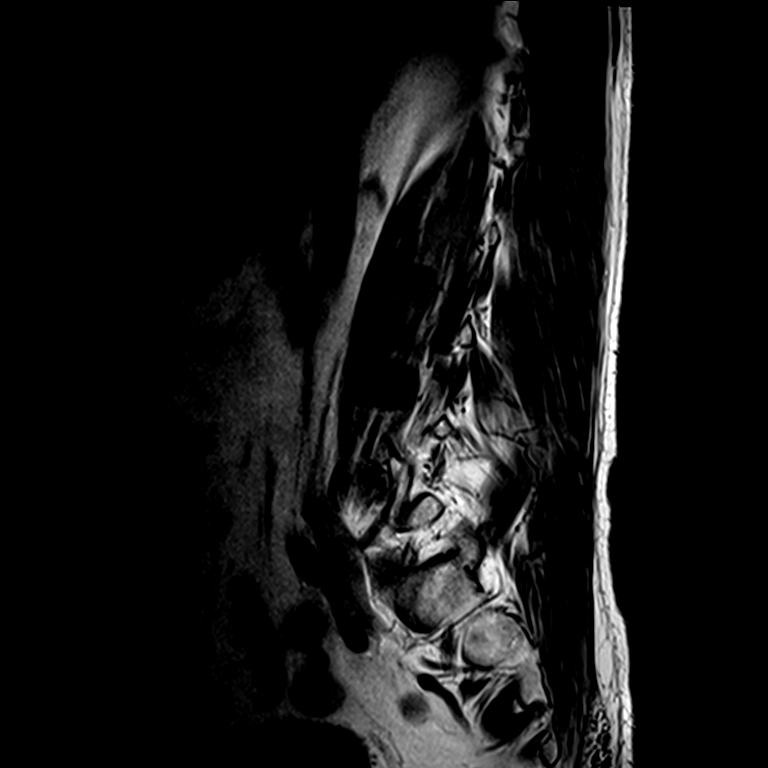
[im 8/15]
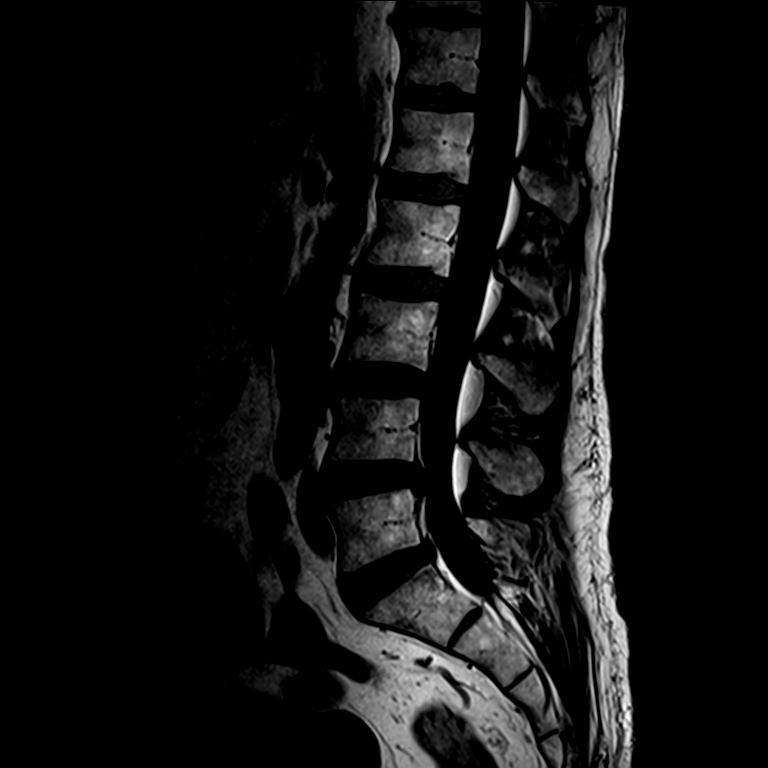
[im 15/15]
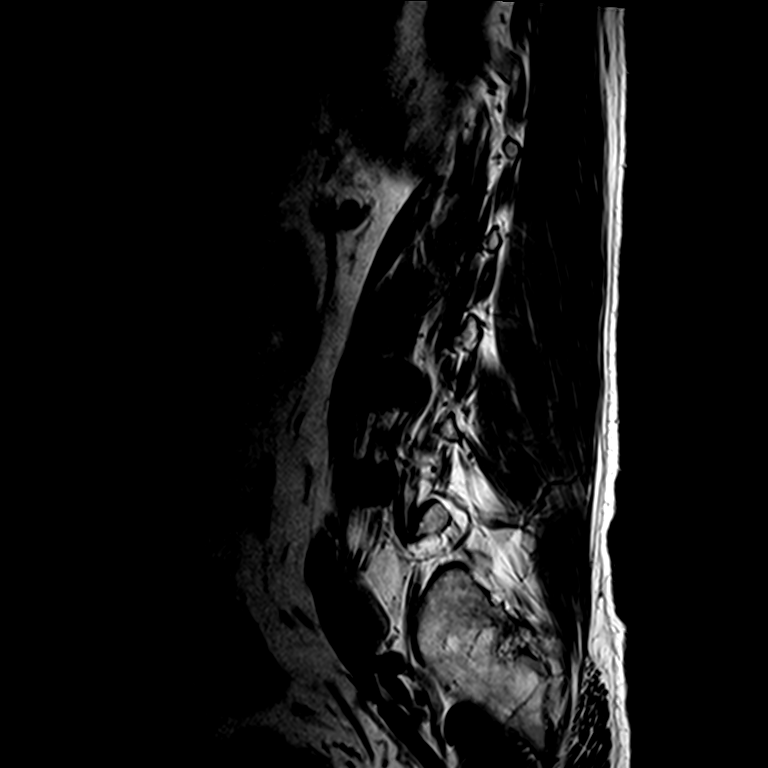

[Series 6: T2 · axial · 4.0mm · 0.27mm/px · z∈[-135,+55]mm · 3 of 48 slices shown (2 of 2)]
[im 7/48]
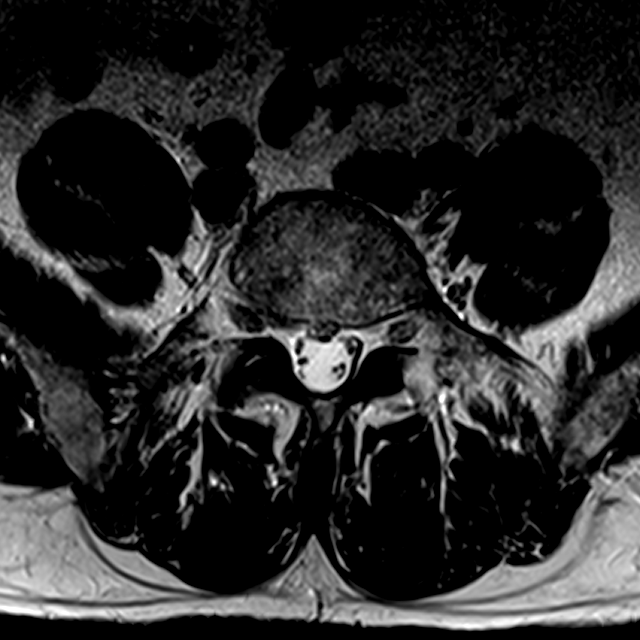
[im 26/48]
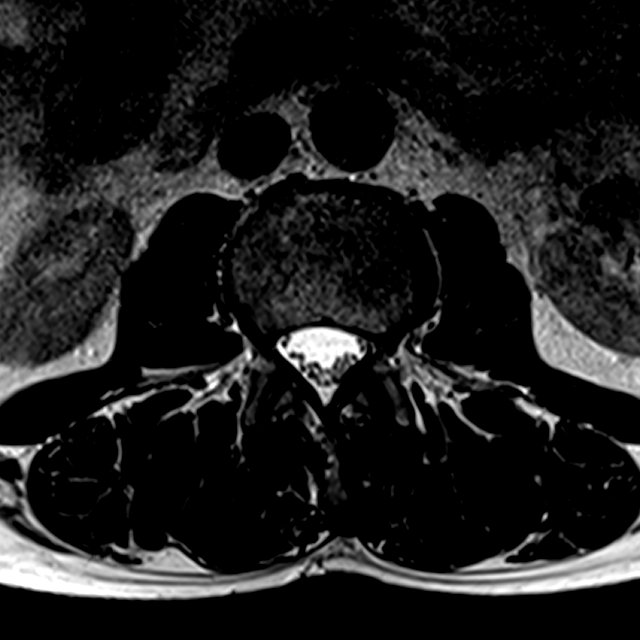
[im 41/48]
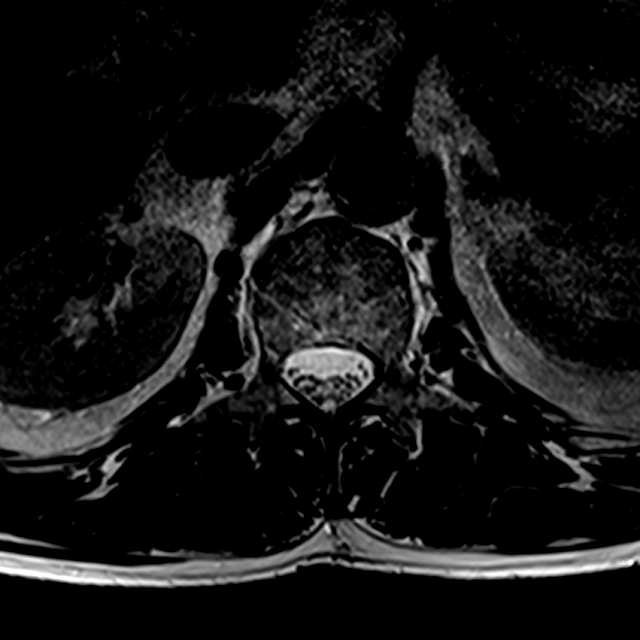

[Series 7: T1 · axial · 4.0mm · 0.27mm/px · z∈[-135,+55]mm · 3 of 48 slices shown (2 of 2)]
[im 7/48]
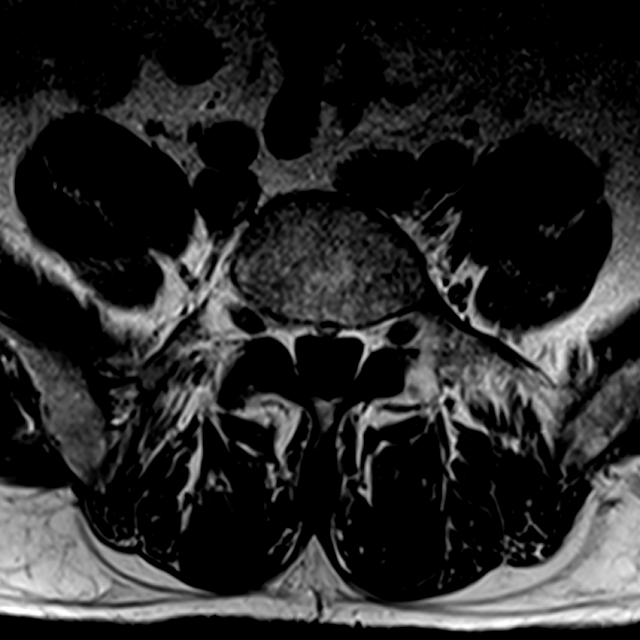
[im 26/48]
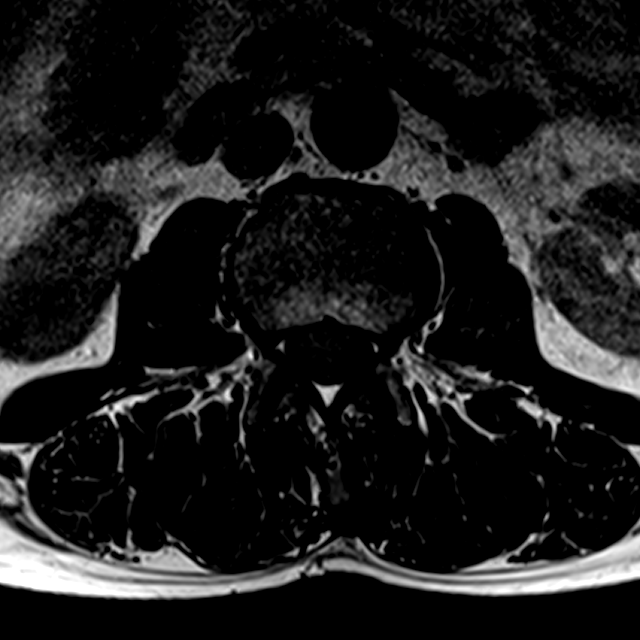
[im 41/48]
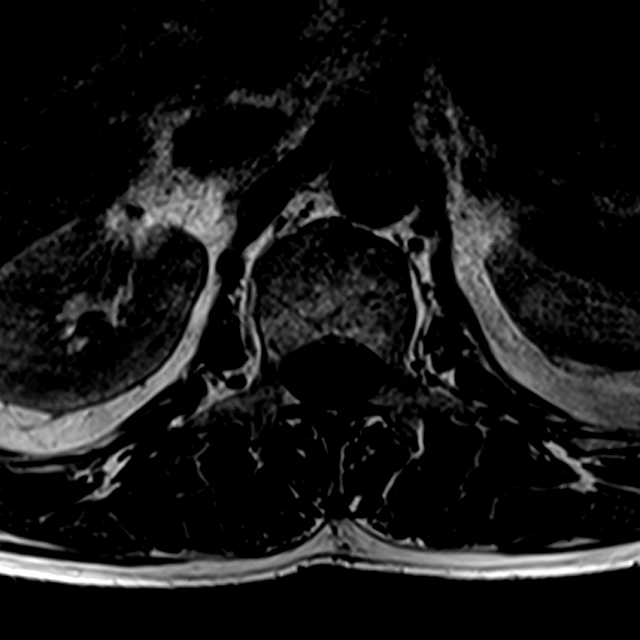

[15 of 48 positions shown; findings below may reference images not displayed]

FINDINGS: Last fully open disk space is labeled L5-S1. Present examination
incorporates from T11-12 disc space through the lower sacrum.

Atherosclerotic type changes of the aorta with ectasia with focal
bulge L3 level measuring up to 2.6 cm AP dimension.

Conus T12-L1 level.

T11-12 through L1-2 with minimal Schmorl's node deformities.

L2-3:  Minimal bulge greater to left.

L3-4: Bulge greater left posterior lateral/ foraminal position.
Additionally, there is a small left foraminal disc protrusion with
mild mass effect upon the exiting left L3 nerve root. Mild facet
joint degenerative changes.

L4-5: Bulge with superimposed right paracentral disc protrusion with
slight caudal extension with mild impression upon the right ventral
thecal sac and crowding of the origin of the right L5 nerve root.
Mild to moderate bilateral facet joint degenerative changes.

L5-S1: Facet joint degenerative changes greater on the right. Mild
bulge with tiny central/right paracentral protrusion. No associated
mass effect.
IMPRESSION: L3-4 bulge greater left posterior lateral/ foraminal position.
Additionally, there is a small left foraminal disc protrusion with
mild mass effect upon the exiting left L3 nerve root. Mild facet
joint degenerative changes.

L4-5 bulge with superimposed right paracentral disc protrusion with
slight caudal extension with mild impression upon the right ventral
thecal sac and crowding of the origin of the right L5 nerve root.
Mild to moderate bilateral facet joint degenerative changes.

L5-S1 mild bulge with tiny central/right paracentral protrusion. No
associated mass effect.

Atherosclerotic type changes of the aorta with ectasia with focal
bulge L3 level measuring up to 2.6 cm AP dimension.

## 2016-10-21 ENCOUNTER — Ambulatory Visit (INDEPENDENT_AMBULATORY_CARE_PROVIDER_SITE_OTHER): Payer: BLUE CROSS/BLUE SHIELD | Admitting: Family Medicine

## 2016-10-21 ENCOUNTER — Encounter: Payer: Self-pay | Admitting: Family Medicine

## 2016-10-21 VITALS — BP 158/88 | Ht 73.0 in | Wt 218.0 lb

## 2016-10-21 DIAGNOSIS — I10 Essential (primary) hypertension: Secondary | ICD-10-CM | POA: Diagnosis not present

## 2016-10-21 DIAGNOSIS — I483 Typical atrial flutter: Secondary | ICD-10-CM

## 2016-10-21 NOTE — Progress Notes (Signed)
   Subjective:    Patient ID: Dennis [name], male    DOB: [birth year], 65 y.o.   MRN: [id number]  HPIHTN. Pt brought in bp readings. Blood pressure high since last night. Feels like he is going to pass out.felt light headed  at times patient noted rapid heart ratecomment as high as 1:30. No chest pain with this. No  Shortness of breath with this  Has been taking bp meds faithfully  Was on a whole pill, then knocked down to half pill of the valsartan. Patient ncreased to 100 his own last see me whlood pressure was elevted.   has been walking and exercising    review of records reveals patient did have atrial flutter the past. Came close to cardioversion. That reverted to sinus on his own. Next  Known coronary artery diseas see prior charts     Review of Systems No headache, no major weight loss or weight gain, no chest pain no back pain abdominal pain no change in bowel habits complete ROS otherwise negative     Objective:   Physical Exam Alert vitals stable, NAD. Blood pressure good on repeat. HEENT normal. Lungs clear. Heart  At times notregular rate and rhythm. , with intermittent elevation of heart rateand intermittently irregular rhythm   EKG revealed atrial flutter with a heart rate of 100   Multiple blood pressure checks in decent range 135/70        Assessment & Plan:   impression recurrence of atrial flutter ith secondary symptoms. Discussed at length. Patient had actually cme ff all aspirin in the past he declined anticoagulation. Strongly encouraged t return to full 1 aspirin per day. #2 maintain full valsartan with elevated blood pressure #3 with history of sensitivity to higher doses of  Medicine as far as metoprolol will continue same dose for now with heart rate. Numerous questions regarding a futter answered. 25 minutes spent most in [place] cardiology referral

## 2016-10-21 NOTE — Patient Instructions (Signed)
Increase the valsartan to full tablet per day  May add an extra one half of the metoprolol if necessary for elevated heart rate  Move back to ful strength 325 mg aspirin daily

## 2016-10-23 ENCOUNTER — Encounter: Payer: Self-pay | Admitting: Family Medicine

## 2016-10-27 ENCOUNTER — Encounter: Payer: Self-pay | Admitting: Cardiology

## 2016-10-27 ENCOUNTER — Ambulatory Visit (INDEPENDENT_AMBULATORY_CARE_PROVIDER_SITE_OTHER): Payer: BLUE CROSS/BLUE SHIELD | Admitting: Cardiology

## 2016-10-27 VITALS — BP 130/88 | HR 74 | Ht 73.0 in | Wt 215.0 lb

## 2016-10-27 DIAGNOSIS — I10 Essential (primary) hypertension: Secondary | ICD-10-CM | POA: Diagnosis not present

## 2016-10-27 DIAGNOSIS — E782 Mixed hyperlipidemia: Secondary | ICD-10-CM

## 2016-10-27 DIAGNOSIS — I2510 Atherosclerotic heart disease of native coronary artery without angina pectoris: Secondary | ICD-10-CM | POA: Diagnosis not present

## 2016-10-27 DIAGNOSIS — I483 Typical atrial flutter: Secondary | ICD-10-CM

## 2016-10-27 MED ORDER — APIXABAN 5 MG PO TABS: 5.0000 mg | ORAL_TABLET | Freq: Two times a day (BID) | ORAL | 6 refills | Status: DC

## 2016-10-27 NOTE — Progress Notes (Signed)
Cardiology Office Note  Date: 10/27/2016   ID: Dennis [name], DOB December 05, 1950, MRN [id number]  PCP: Mickie Hillier, MD  Primary Cardiologist: Rozann Lesches, MD   Chief Complaint  Patient presents with  . Atrial Flutter    History of Present Illness: Dennis [name] is a 65 y.o. male last seen in January. He was seen by Dr. [name] recently for elevated blood pressure and a feeling of presyncope, noted to be in atrial flutter. Patient's impression is that he had reverted back to atrial flutter from sinus rhythm, although it looks more like he has actually been in chronic atrial flutter since 2015 (noted on all interval ECGs). We have discussed management options for his chronic atrial flutter in detail many times including cardioversion and use of anticoagulants. Fortunately, over time he has had not had much trouble with RVR, although heart rate has been more elevated recently. Beta blocker dose was increased by Dr. [name]. In the past he had declined cardioversion and anticoagulation. His CHADSVASC score is 2 (annual stroke risk of approximately 2.3% per year on aspirin versus 0.8% per year on Eliquis).  Today we once again discussed these options, and also referral to EP to discuss an attempt at ablation as a longer term management strategy which might actually afford him the option of not having to take anticoagulation long-term. He has not had any obvious atrial fibrillation. He was amenable to EP referral, and initiating Eliquis instead of aspirin in anticipation of ablation/return to sinus rhythm. I have recommended an echocardiogram and follow-up as well.  He does states that he has had changes in his stools and blood noted in the past, based on "what he ate."  He has tolerated aspirin in general.  He states that he has done well until just recently, walking up to a mile at a time for exercise, recently hunting and doing some outdoor chores. He does not report angina. Stress testing in  2015 was low risk. I reviewed his recent ECG.  Past Medical History:  Diagnosis Date  . Atrial flutter ([place])    Diagnosed by ECG August 2015  . Colitis   . COPD (chronic obstructive pulmonary disease) ([place])   . Coronary atherosclerosis of native coronary artery    Multivessel status post CABG  . Essential hypertension, benign   . Hyperlipidemia   . Insomnia     Past Surgical History:  Procedure Laterality Date  . COLONOSCOPY WITH PROPOFOL N/A 08/30/2015   Procedure: COLONOSCOPY WITH PROPOFOL at cecum at 0814; withdrawal time=8 minutes;  Surgeon: Daneil Dolin, MD;  Location: [facility];  Service: Endoscopy;  Laterality: N/A;  . CORONARY ARTERY BYPASS GRAFT  2005   Dr. Lucianne Lei Trigt: LIMA to LAD, right radial to circumflex, SVG to RCA  . CORONARY ARTERY BYPASS GRAFT  10/16/2004    Current Outpatient Prescriptions  Medication Sig Dispense Refill  . albuterol (PROVENTIL HFA;VENTOLIN HFA) 108 (90 Base) MCG/ACT inhaler Inhale 2 puffs into the lungs every 4 (four) hours as needed for wheezing. 2 Inhaler 12  . budesonide-formoterol (SYMBICORT) 160-4.5 MCG/ACT inhaler Inhale 2 puffs into the lungs 2 (two) times daily.    Marland Kitchen ezetimibe-simvastatin (VYTORIN) 10-40 MG tablet TAKE 1 TABLET BY MOUTH AT BEDTIME 90 tablet 1  . fenofibrate 160 MG tablet TAKE 1 TABLET BY MOUTH EVERY DAY 90 tablet 1  . fish oil-omega-3 fatty acids 1000 MG capsule Take 2 g by mouth daily.    . metoprolol (LOPRESSOR) 50 MG tablet Take  75 mg by mouth 2 (two) times daily.    . valsartan (DIOVAN) 160 MG tablet Take 160 mg by mouth daily.    Marland Kitchen zolpidem (AMBIEN) 10 MG tablet TAKE 1 TABLET BY MOUTH AT BEDTIME AS NEEDED FOR SLEEP 90 tablet 1  . apixaban (ELIQUIS) 5 MG TABS tablet Take 1 tablet (5 mg total) by mouth 2 (two) times daily. 60 tablet 6   No current facility-administered medications for this visit.    Allergies:  Ace inhibitors and Enalapril   Social History: The patient  reports that he quit smoking about 18 years  ago. His smoking use included Cigarettes. He started smoking about 45 years ago. He has a 54.00 pack-year smoking history. His smokeless tobacco use includes Chew. He reports that he drinks alcohol. He reports that he does not use drugs.   Family History: The patient's family history includes Hypertension in his mother.   ROS:  Please see the history of present illness. Otherwise, complete review of systems is positive for none.  All other systems are reviewed and negative.   Physical Exam: VS:  BP 130/88   Pulse 74   Ht 6\' 1"  (1.854 m)   Wt 215 lb (97.5 kg)   SpO2 99%   BMI 28.37 kg/m , BMI Body mass index is 28.37 kg/m.  Wt Readings from Last 3 Encounters:  10/27/16 215 lb (97.5 kg)  10/21/16 218 lb (98.9 kg)  07/08/16 215 lb 6 oz (97.7 kg)    Patient is in no acute distress. HEENT: Conjunctiva and lids normal, oropharynx clear. Neck: Supple, no elevated JVP or carotid bruits, no thyromegaly. Lungs: Decreased breath sounds throughout without wheezing, nonlabored breathing at rest. Cardiac: Irregular rate and rhythm, distant heart sounds, no S3 or significant systolic murmur, no pericardial rub. Abdomen: Soft, nontender, bowel sounds present, no guarding or rebound. Extremities: No edema, distal pulses 2+. Skin: Warm and dry. Musculoskeletal: No kyphosis. Neuropsychiatric: Alert and oriented x3, affect grossly appropriate.  ECG: I personally reviewed the tracing from 10/21/2016 which showed typical atrial flutter with variable conduction and controlled ventricular response.  Recent Labwork: 07/03/2016: ALT 26; AST 39; BUN 17; Creatinine, Ser 1.41; Potassium 4.8; Sodium 136     Component Value Date/Time   CHOL 272 (H) 07/03/2016 0832   TRIG 242 (H) 07/03/2016 0832   HDL 39 (L) 07/03/2016 0832   CHOLHDL 7.0 (H) 07/03/2016 0832   CHOLHDL 5.6 08/25/2014 0922   VLDL 35 08/25/2014 0922   LDLCALC 185 (H) 07/03/2016 0832    Other Studies Reviewed Today:  Echocardiogram  07/24/2014: Study Conclusions  - Left ventricle: The cavity size was normal. Wall thickness was increased increased in a pattern of mild to moderate LVH. Systolic function was normal. The estimated ejection fraction was in the range of 50% to 55%. - Regional wall motion abnormality: Mild hypokinesis of the basal-mid anteroseptal myocardium. - Aortic valve: Mildly to moderately calcified annulus. Moderately thickened leaflets. - Aorta: Visualized portions of the proximal ascending aorta are normal in size, 3.3 cm. Aortic root dimension: 42 mm (ED). - Aortic root: The aortic root was mildly dilated. - Mitral valve: Mildly calcified annulus. Mildly thickened leaflets . There was mild regurgitation. - Left atrium: The atrium was mildly dilated. - Atrial septum: No defect or patent foramen ovale was identified. - Systemic veins: IVC is dilated with normal respriatory response, estimated RA pressure 73mmHg. - Technically adequate study.  Lexiscan Cardiolite 07/24/2014: IMPRESSION:  1. No reversible ischemia or infarction.  2.  Normal left ventricular wall motion, pattern consistent with bundle branch block.  3. Left ventricular ejection fraction 51%  4. Low-risk stress test findings*.  Assessment and Plan:  1. Chronic, typical atrial flutter. This has largely been rate controlled on beta blocker, has had some recent RVR with changes in his medication and elevation in blood pressure. CHADSVASC score is 2. Today I discussed management options with the patient and his wife. At this point our plan is to stop aspirin and initiate Eliquis at 5 mg twice daily for stroke prophylaxis. Also making referral to EP for discussion of ablation as a way of more effectively managing this rhythm long-term and perhaps obviating the need for long-term anticoagulation. We will obtain an echocardiogram to follow up on LVEF.  2. 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  Follow-up stress testing in August 2015 was low risk. He does not report angina at this time. Has had some fluctuation in blood pressure recently. If management of his atrial flutter does not lead to symptomatic improvement, may need to consider further ischemic evaluation as well.  3. Essential hypertension, blood pressure control is adequate today on Diovan and Lopressor.  4. Hyperlipidemia, on Vytorin. He follows with Dr. [name].   Current medicines were reviewed with the patient today.   Orders Placed This Encounter  Procedures  . ECHOCARDIOGRAM COMPLETE    Disposition: Referral to EP.  Signed, Satira Sark, MD, [facility] 10/27/2016 11:54 AM    [facility] [place]. [address], [place], [place] [address] Phone: [phone]; Fax: [phone]

## 2016-10-27 NOTE — Patient Instructions (Signed)
Your physician recommends that you schedule a follow-up appointment in: 1 month with Dr. Domenic Polite  Your physician has recommended you make the following change in your medication:  STOP Taking Aspirin  Start Taking Eliquis 5 mg Two Times Daily   Your physician has requested that you have an echocardiogram. Echocardiography is a painless test that uses sound waves to create images of your heart. It provides your doctor with information about the size and shape of your heart and how well your heart's chambers and valves are working. This procedure takes approximately one hour. There are no restrictions for this procedure.  You have been referred to [facility]  If you need a refill on your cardiac medications before your next appointment, please call your pharmacy.  Thank you for choosing [facility]!

## 2016-10-28 ENCOUNTER — Ambulatory Visit (HOSPITAL_COMMUNITY)
Admission: RE | Admit: 2016-10-28 | Discharge: 2016-10-28 | Disposition: A | Discharge status: Home / Self Care | Payer: BLUE CROSS/BLUE SHIELD | Source: Ambulatory Visit | Attending: Cardiology | Admitting: Cardiology

## 2016-10-28 ENCOUNTER — Telehealth: Payer: Self-pay | Admitting: Cardiology

## 2016-10-28 DIAGNOSIS — I4892 Unspecified atrial flutter: Secondary | ICD-10-CM | POA: Insufficient documentation

## 2016-10-28 DIAGNOSIS — Z87891 Personal history of nicotine dependence: Secondary | ICD-10-CM | POA: Diagnosis not present

## 2016-10-28 DIAGNOSIS — J449 Chronic obstructive pulmonary disease, unspecified: Secondary | ICD-10-CM | POA: Insufficient documentation

## 2016-10-28 DIAGNOSIS — R1110 Vomiting, unspecified: Secondary | ICD-10-CM | POA: Diagnosis not present

## 2016-10-28 DIAGNOSIS — I071 Rheumatic tricuspid insufficiency: Secondary | ICD-10-CM | POA: Diagnosis not present

## 2016-10-28 DIAGNOSIS — I483 Typical atrial flutter: Secondary | ICD-10-CM

## 2016-10-28 DIAGNOSIS — I358 Other nonrheumatic aortic valve disorders: Secondary | ICD-10-CM | POA: Insufficient documentation

## 2016-10-28 NOTE — Telephone Encounter (Signed)
Pt came into office requesting to speak w/ Dr. Domenic Polite, he did not tell me what his concern was, but would like to ask him a question. He had an Echo this afternoon and would like those results called to him.

## 2016-10-28 NOTE — Telephone Encounter (Signed)
Echocardiogram read by Dr Domenic Polite but not yet resulted to nurse to give pt results-cc

## 2016-10-28 NOTE — Progress Notes (Signed)
*  PRELIMINARY RESULTS* Echocardiogram 2D Echocardiogram has been performed.  Leavy Cella 10/28/2016, 1:41 PM

## 2016-10-28 NOTE — Telephone Encounter (Signed)
Echo resulted,pt notified, and said he was put on a wait list for ablation as there are no apts until January.He said he doesn't want to wait around and think about the procedure,plus he has met his insurance deductible for this year.

## 2016-11-05 ENCOUNTER — Ambulatory Visit (INDEPENDENT_AMBULATORY_CARE_PROVIDER_SITE_OTHER): Payer: BLUE CROSS/BLUE SHIELD | Admitting: Internal Medicine

## 2016-11-05 ENCOUNTER — Encounter: Payer: Self-pay | Admitting: Internal Medicine

## 2016-11-05 VITALS — BP 112/82 | HR 73 | Ht 73.0 in | Wt 218.2 lb

## 2016-11-05 DIAGNOSIS — I483 Typical atrial flutter: Secondary | ICD-10-CM | POA: Diagnosis not present

## 2016-11-05 DIAGNOSIS — Z01812 Encounter for preprocedural laboratory examination: Secondary | ICD-10-CM | POA: Diagnosis not present

## 2016-11-05 NOTE — Patient Instructions (Addendum)
Medication Instructions:  Please continue all medications as listed.  Labwork: 11/10/16 - BMET/CBC w/ diff / INR   Testing/Procedures: TEE and ablation  Your physician has recommended that you have an ablation. Catheter ablation is a medical procedure used to treat some cardiac arrhythmias (irregular heartbeats). During catheter ablation, a long, thin, flexible tube is put into a blood vessel in your groin (upper thigh), or neck. This tube is called an ablation catheter. It is then guided to your heart through the blood vessel. Radio frequency waves destroy small areas of heart tissue where abnormal heartbeats may cause an arrhythmia to start. Please see the instruction sheet given to you today.  Follow-Up: Follow-up appointment and 6 weeks with Dr. Lovena Le.   Any Other Special Instructions Will Be Listed Below (If Applicable).  Please report to the [facility] on 11/21/16 at 8:30 AM  Nothing to eat or drink after midnight prior to procedure  Do not take any medication prior to procedure  Plan 1 night stay OR someone to drive you home after the procedure

## 2016-11-05 NOTE — Progress Notes (Signed)
HPI [name] is referred today by Dr. Domenic [name] to consider atrial flutter RFA. The patient is a pleasant 65 yo man with CAD, s/p CABG who developed atrial flutter with a CVR and an RVR a couple of years ago. He rate has been controlled. He has not had any stroke like symptoms. He does not have angina and has minimal palpitations. I have reviewed all the ECG's over the past 2 years and all demonstrate atrial fib with a RVR or CVR. He denies tobacco or ETOH abuse. Allergies  Allergen Reactions  . Ace Inhibitors Shortness Of Breath and Cough  . Enalapril Cough    Pt has been switched to Valsartan and is tolerating the different class.     Current Outpatient Prescriptions  Medication Sig Dispense Refill  . albuterol (PROVENTIL HFA;VENTOLIN HFA) 108 (90 Base) MCG/ACT inhaler Inhale 2 puffs into the lungs every 4 (four) hours as needed for wheezing. 2 Inhaler 12  . aspirin 325 MG EC tablet Take 325 mg by mouth daily.    . budesonide-formoterol (SYMBICORT) 160-4.5 MCG/ACT inhaler Inhale 2 puffs into the lungs 2 (two) times daily.    Marland Kitchen ezetimibe-simvastatin (VYTORIN) 10-40 MG tablet TAKE 1 TABLET BY MOUTH AT BEDTIME 90 tablet 1  . fenofibrate 160 MG tablet TAKE 1 TABLET BY MOUTH EVERY DAY 90 tablet 1  . fish oil-omega-3 fatty acids 1000 MG capsule Take 2 g by mouth daily.    . metoprolol (LOPRESSOR) 50 MG tablet Take 75 mg by mouth 2 (two) times daily.    . valsartan (DIOVAN) 160 MG tablet Take 160 mg by mouth daily.    Marland Kitchen zolpidem (AMBIEN) 10 MG tablet TAKE 1 TABLET BY MOUTH AT BEDTIME AS NEEDED FOR SLEEP 90 tablet 1  . apixaban (ELIQUIS) 5 MG TABS tablet Take 1 tablet (5 mg total) by mouth 2 (two) times daily. (Patient not taking: Reported on 11/05/2016) 60 tablet 6   No current facility-administered medications for this visit.      Past Medical History:  Diagnosis Date  . Atrial flutter ([place])    Diagnosed by ECG August 2015  . Colitis   . COPD (chronic obstructive pulmonary  disease) ([place])   . Coronary atherosclerosis of native coronary artery    Multivessel status post CABG  . Essential hypertension, benign   . Hyperlipidemia   . Insomnia     ROS:   All systems reviewed and negative except as noted in the HPI.   Past Surgical History:  Procedure Laterality Date  . COLONOSCOPY WITH PROPOFOL N/A 08/30/2015   Procedure: COLONOSCOPY WITH PROPOFOL at cecum at 0814; withdrawal time=8 minutes;  Surgeon: Daneil Dolin, MD;  Location: [facility];  Service: Endoscopy;  Laterality: N/A;  . CORONARY ARTERY BYPASS GRAFT  2005   Dr. Lucianne Lei Trigt: LIMA to LAD, right radial to circumflex, SVG to RCA  . CORONARY ARTERY BYPASS GRAFT  10/16/2004     Family History  Problem Relation Age of Onset  . Hypertension Mother   . Colon cancer Neg Hx      Social History   Social History  . Marital status: Married    Spouse name: N/A  . Number of children: N/A  . Years of education: N/A   Occupational History  . Not on file.   Social History Main Topics  . Smoking status: Former Smoker    Packs/day: 2.00    Years: 27.00    Types: Cigarettes    Start date:  02/01/1971    Quit date: 01/31/1998  . Smokeless tobacco: Current User    Types: Chew  . Alcohol use 0.0 oz/week     Comment: Drinks beer daily, typically 2-8 a day. Also 4 oz red wine an evening.  . Drug use: No  . Sexual activity: Not on file   Other Topics Concern  . Not on file   Social History Narrative  . No narrative on file     BP 112/82   Pulse 73   Ht 6\' 1"  (1.854 m)   Wt 218 lb 3.2 oz (99 kg)   BMI 28.79 kg/m   Physical Exam:  Well appearing 65 yo man, NAD HEENT: Unremarkable Neck:  6 cm JVD, no thyromegally Lymphatics:  No adenopathy Back:  No CVA tenderness Lungs:  Clear with no wheezes, rales or rhonchi HEART:  IRegular rate rhythm, no murmurs, no rubs, no clicks Abd:  soft, positive bowel sounds, no organomegally, no rebound, no guarding Ext:  2 plus pulses, no edema, no  cyanosis, no clubbing Skin:  No rashes no nodules Neuro:  CN II through XII intact, motor grossly intact  EKG - typical atrial flutter with a  CVR   Assess/Plan: 1. Atrial flutter - he has some symptoms and would like not to take long term systemic anti-coagulation. I have discussed the treatment options in detail. The risks/benefits/goals/expectations of EP study and ablation of atrial flutter was reviewed with his wife and he wishes to proceed. 2. CAD - he denies all anginal symptoms. 3. HTN - his blood pressue is well controlled. He will continue his current meds. 4. Coags - he has not started his Eliquis. I have instructed him to do so. He wants to have his ablation before the end of the year. He will need a TEE guided DCCV before the ablation.  [name].D.

## 2016-11-14 ENCOUNTER — Other Ambulatory Visit: Payer: Self-pay

## 2016-11-14 ENCOUNTER — Telehealth: Payer: Self-pay

## 2016-11-14 ENCOUNTER — Other Ambulatory Visit: Payer: Self-pay | Admitting: Internal Medicine

## 2016-11-14 DIAGNOSIS — I483 Typical atrial flutter: Secondary | ICD-10-CM

## 2016-11-14 DIAGNOSIS — Z01812 Encounter for preprocedural laboratory examination: Secondary | ICD-10-CM

## 2016-11-14 DIAGNOSIS — J449 Chronic obstructive pulmonary disease, unspecified: Secondary | ICD-10-CM

## 2016-11-14 LAB — CBC WITH DIFFERENTIAL/PLATELET
BASOS ABS: 0 {cells}/uL (ref 0–200)
Basophils Relative: 0 %
EOS ABS: 138 {cells}/uL (ref 15–500)
Eosinophils Relative: 2 %
HCT: 43.4 % (ref 38.5–50.0)
Hemoglobin: 14.1 g/dL (ref 13.2–17.1)
LYMPHS PCT: 32 %
Lymphs Abs: 2208 cells/uL (ref 850–3900)
MCH: 30 pg (ref 27.0–33.0)
MCHC: 32.5 g/dL (ref 32.0–36.0)
MCV: 92.3 fL (ref 80.0–100.0)
MONOS PCT: 12 %
MPV: 10.5 fL (ref 7.5–12.5)
Monocytes Absolute: 828 cells/uL (ref 200–950)
NEUTROS PCT: 54 %
Neutro Abs: 3726 cells/uL (ref 1500–7800)
PLATELETS: 145 10*3/uL (ref 140–400)
RBC: 4.7 MIL/uL (ref 4.20–5.80)
RDW: 13.1 % (ref 11.0–15.0)
WBC: 6.9 10*3/uL (ref 3.8–10.8)

## 2016-11-14 NOTE — Telephone Encounter (Signed)
Faxed lab orders (BMET / CBC w/ diff / INR) to [facility] at [phone].

## 2016-11-15 LAB — BASIC METABOLIC PANEL
BUN: 20 mg/dL (ref 7–25)
CALCIUM: 9.5 mg/dL (ref 8.6–10.3)
CHLORIDE: 102 mmol/L (ref 98–110)
CO2: 26 mmol/L (ref 20–31)
Creat: 1.51 mg/dL — ABNORMAL HIGH (ref 0.70–1.25)
GLUCOSE: 94 mg/dL (ref 65–99)
Potassium: 4.7 mmol/L (ref 3.5–5.3)
SODIUM: 137 mmol/L (ref 135–146)

## 2016-11-15 LAB — PROTIME-INR
INR: 1.1
Prothrombin Time: 11.4 s (ref 9.0–11.5)

## 2016-11-21 ENCOUNTER — Ambulatory Visit (HOSPITAL_BASED_OUTPATIENT_CLINIC_OR_DEPARTMENT_OTHER): Payer: BLUE CROSS/BLUE SHIELD

## 2016-11-21 ENCOUNTER — Encounter (HOSPITAL_COMMUNITY): Payer: Self-pay | Admitting: *Deleted

## 2016-11-21 ENCOUNTER — Encounter (HOSPITAL_COMMUNITY)
Admission: RE | Disposition: A | Discharge status: Home / Self Care | Payer: Self-pay | Source: Ambulatory Visit | Attending: Internal Medicine

## 2016-11-21 ENCOUNTER — Ambulatory Visit (HOSPITAL_COMMUNITY)
Admission: RE | Admit: 2016-11-21 | Discharge: 2016-11-21 | Disposition: A | Discharge status: Home / Self Care | Payer: BLUE CROSS/BLUE SHIELD | Source: Ambulatory Visit | Attending: Internal Medicine | Admitting: Internal Medicine

## 2016-11-21 DIAGNOSIS — E785 Hyperlipidemia, unspecified: Secondary | ICD-10-CM | POA: Insufficient documentation

## 2016-11-21 DIAGNOSIS — I4891 Unspecified atrial fibrillation: Secondary | ICD-10-CM | POA: Insufficient documentation

## 2016-11-21 DIAGNOSIS — I2510 Atherosclerotic heart disease of native coronary artery without angina pectoris: Secondary | ICD-10-CM | POA: Diagnosis not present

## 2016-11-21 DIAGNOSIS — Z8249 Family history of ischemic heart disease and other diseases of the circulatory system: Secondary | ICD-10-CM | POA: Diagnosis not present

## 2016-11-21 DIAGNOSIS — J449 Chronic obstructive pulmonary disease, unspecified: Secondary | ICD-10-CM | POA: Diagnosis not present

## 2016-11-21 DIAGNOSIS — I483 Typical atrial flutter: Secondary | ICD-10-CM | POA: Diagnosis present

## 2016-11-21 DIAGNOSIS — G4700 Insomnia, unspecified: Secondary | ICD-10-CM | POA: Diagnosis not present

## 2016-11-21 DIAGNOSIS — Z7982 Long term (current) use of aspirin: Secondary | ICD-10-CM | POA: Insufficient documentation

## 2016-11-21 DIAGNOSIS — Z87891 Personal history of nicotine dependence: Secondary | ICD-10-CM | POA: Diagnosis not present

## 2016-11-21 DIAGNOSIS — I4892 Unspecified atrial flutter: Secondary | ICD-10-CM | POA: Diagnosis present

## 2016-11-21 DIAGNOSIS — Z79899 Other long term (current) drug therapy: Secondary | ICD-10-CM | POA: Insufficient documentation

## 2016-11-21 DIAGNOSIS — I10 Essential (primary) hypertension: Secondary | ICD-10-CM | POA: Insufficient documentation

## 2016-11-21 DIAGNOSIS — Z951 Presence of aortocoronary bypass graft: Secondary | ICD-10-CM | POA: Insufficient documentation

## 2016-11-21 HISTORY — PX: ELECTROPHYSIOLOGIC STUDY: SHX172A

## 2016-11-21 HISTORY — PX: TEE WITHOUT CARDIOVERSION: SHX5443

## 2016-11-21 SURGERY — A-FLUTTER ABLATION
Anesthesia: LOCAL

## 2016-11-21 SURGERY — ECHOCARDIOGRAM, TRANSESOPHAGEAL
Anesthesia: Moderate Sedation

## 2016-11-21 MED ORDER — SODIUM CHLORIDE 0.9 % IV SOLN
INTRAVENOUS | Status: DC
  Administered 2016-11-21: 09:00:00 via INTRAVENOUS

## 2016-11-21 MED ORDER — MIDAZOLAM HCL 5 MG/5ML IJ SOLN
INTRAMUSCULAR | Status: DC | PRN
  Administered 2016-11-21 (×2): 1 mg via INTRAVENOUS
  Administered 2016-11-21: 2 mg via INTRAVENOUS
  Administered 2016-11-21: 1 mg via INTRAVENOUS
  Administered 2016-11-21: 2 mg via INTRAVENOUS
  Administered 2016-11-21: 1 mg via INTRAVENOUS

## 2016-11-21 MED ORDER — BUPIVACAINE HCL (PF) 0.25 % IJ SOLN
INTRAMUSCULAR | Status: DC | PRN
  Administered 2016-11-21: 60 mL

## 2016-11-21 MED ORDER — SODIUM CHLORIDE 0.9% FLUSH: 3.0000 mL | Freq: Two times a day (BID) | INTRAVENOUS | Status: DC

## 2016-11-21 MED ORDER — MIDAZOLAM HCL 10 MG/2ML IJ SOLN
INTRAMUSCULAR | Status: DC | PRN
  Administered 2016-11-21: 2 mg via INTRAVENOUS
  Administered 2016-11-21: 1 mg via INTRAVENOUS
  Administered 2016-11-21: 2 mg via INTRAVENOUS

## 2016-11-21 MED ORDER — MIDAZOLAM HCL 5 MG/ML IJ SOLN
INTRAMUSCULAR | Status: AC
  Filled 2016-11-21: qty 2

## 2016-11-21 MED ORDER — METOPROLOL TARTRATE 50 MG PO TABS: 75.0000 mg | ORAL_TABLET | Freq: Two times a day (BID) | ORAL | Status: DC

## 2016-11-21 MED ORDER — FENTANYL CITRATE (PF) 100 MCG/2ML IJ SOLN
INTRAMUSCULAR | Status: DC | PRN
Start: 2016-11-21 — End: 2016-11-21
  Administered 2016-11-21 (×3): 25 ug via INTRAVENOUS

## 2016-11-21 MED ORDER — SODIUM CHLORIDE 0.9% FLUSH: 3.0000 mL | INTRAVENOUS | Status: DC | PRN

## 2016-11-21 MED ORDER — FENTANYL CITRATE (PF) 100 MCG/2ML IJ SOLN
INTRAMUSCULAR | Status: AC
  Filled 2016-11-21: qty 2

## 2016-11-21 MED ORDER — SODIUM CHLORIDE 0.9 % IV SOLN: INTRAVENOUS | Status: DC

## 2016-11-21 MED ORDER — OMEGA-3-ACID ETHYL ESTERS 1 G PO CAPS: 2.0000 g | ORAL_CAPSULE | Freq: Every day | ORAL | Status: DC

## 2016-11-21 MED ORDER — FENOFIBRATE 160 MG PO TABS: 160.0000 mg | ORAL_TABLET | Freq: Every day | ORAL | Status: DC

## 2016-11-21 MED ORDER — SODIUM CHLORIDE 0.9 % IV SOLN: 250.0000 mL | INTRAVENOUS | Status: DC | PRN

## 2016-11-21 MED ORDER — FENTANYL CITRATE (PF) 100 MCG/2ML IJ SOLN
INTRAMUSCULAR | Status: DC | PRN
  Administered 2016-11-21 (×2): 12.5 ug via INTRAVENOUS
  Administered 2016-11-21: 25 ug via INTRAVENOUS
  Administered 2016-11-21 (×2): 12.5 ug via INTRAVENOUS
  Administered 2016-11-21: 25 ug via INTRAVENOUS

## 2016-11-21 MED ORDER — ZOLPIDEM TARTRATE 5 MG PO TABS: 10.0000 mg | ORAL_TABLET | Freq: Every evening | ORAL | Status: DC | PRN

## 2016-11-21 MED ORDER — APIXABAN 5 MG PO TABS: 5.0000 mg | ORAL_TABLET | Freq: Two times a day (BID) | ORAL | Status: DC

## 2016-11-21 MED ORDER — BUPIVACAINE HCL (PF) 0.25 % IJ SOLN
INTRAMUSCULAR | Status: AC
  Filled 2016-11-21: qty 30

## 2016-11-21 MED ORDER — ONDANSETRON HCL 4 MG/2ML IJ SOLN: 4.0000 mg | Freq: Four times a day (QID) | INTRAMUSCULAR | Status: DC | PRN

## 2016-11-21 MED ORDER — MIDAZOLAM HCL 5 MG/5ML IJ SOLN
INTRAMUSCULAR | Status: AC
  Filled 2016-11-21: qty 5

## 2016-11-21 MED ORDER — ALBUTEROL SULFATE (2.5 MG/3ML) 0.083% IN NEBU: 3.0000 mL | INHALATION_SOLUTION | RESPIRATORY_TRACT | Status: DC | PRN

## 2016-11-21 MED ORDER — EZETIMIBE-SIMVASTATIN 10-40 MG PO TABS
1.0000 | ORAL_TABLET | Freq: Every day | ORAL | Status: DC
  Filled 2016-11-21: qty 1

## 2016-11-21 MED ORDER — HEPARIN (PORCINE) IN NACL 2-0.9 UNIT/ML-% IJ SOLN
INTRAMUSCULAR | Status: DC | PRN
  Administered 2016-11-21: 12:00:00

## 2016-11-21 MED ORDER — HEPARIN (PORCINE) IN NACL 2-0.9 UNIT/ML-% IJ SOLN
INTRAMUSCULAR | Status: AC
  Filled 2016-11-21: qty 500

## 2016-11-21 MED ORDER — BUTAMBEN-TETRACAINE-BENZOCAINE 2-2-14 % EX AERO
INHALATION_SPRAY | CUTANEOUS | Status: DC | PRN
  Administered 2016-11-21: 2 via TOPICAL

## 2016-11-21 MED ORDER — ACETAMINOPHEN 325 MG PO TABS: 650.0000 mg | ORAL_TABLET | ORAL | Status: DC | PRN

## 2016-11-21 SURGICAL SUPPLY — 12 items
BAG SNAP BAND KOVER 36X36 (MISCELLANEOUS) ×2 IMPLANT
CATH BLAZERPRIME XP (ABLATOR) ×2 IMPLANT
CATH DUODECA HALO/ISMUS 7FR (CATHETERS) ×2 IMPLANT
CATH HEX JOS 2-5-2 65CM 6F REP (CATHETERS) ×2 IMPLANT
CATH JOSEPHSON QUAD-ALLRED 6FR (CATHETERS) ×2 IMPLANT
PACK EP LATEX FREE (CUSTOM PROCEDURE TRAY) ×1
PACK EP LF (CUSTOM PROCEDURE TRAY) ×1 IMPLANT
PAD DEFIB LIFELINK (PAD) ×2 IMPLANT
SHEATH PINNACLE 6F 10CM (SHEATH) ×2 IMPLANT
SHEATH PINNACLE 7F 10CM (SHEATH) ×2 IMPLANT
SHEATH PINNACLE 8F 10CM (SHEATH) ×4 IMPLANT
SHIELD RADPAD SCOOP 12X17 (MISCELLANEOUS) ×2 IMPLANT

## 2016-11-21 NOTE — Discharge Instructions (Signed)
No driving for 3 days. No lifting over 5 lbs for 1 week. No sexual activity for 1 week. You may return to work in 1 week.  Keep procedure site clean & dry. If you notice increased pain, swelling, bleeding or pus, call/return!  You may shower, but no soaking baths/hot tubs/pools for 1 week.  ° ° °

## 2016-11-21 NOTE — Interval H&P Note (Signed)
History and Physical Interval Note:  11/21/2016 11:55 AM  [name]  has presented today for surgery, with the diagnosis of flutter  The various methods of treatment have been discussed with the patient and family. After consideration of risks, benefits and other options for treatment, the patient has consented to  Procedure(s): A-Flutter Ablation (N/A) as a surgical intervention .  The patient's history has been reviewed, patient examined, no change in status, stable for surgery.  I have reviewed the patient's chart and labs.  Questions were answered to the patient's satisfaction.     Dennis [name]

## 2016-11-21 NOTE — H&P (View-Only) (Signed)
HPI [name] is referred today by Dr. Domenic Polite to consider atrial flutter RFA. The patient is a pleasant 65 yo man with CAD, s/p CABG who developed atrial flutter with a CVR and an RVR a couple of years ago. He rate has been controlled. He has not had any stroke like symptoms. He does not have angina and has minimal palpitations. I have reviewed all the ECG's over the past 2 years and all demonstrate atrial fib with a RVR or CVR. He denies tobacco or ETOH abuse. Allergies  Allergen Reactions  . Ace Inhibitors Shortness Of Breath and Cough  . Enalapril Cough    Pt has been switched to Valsartan and is tolerating the different class.     Current Outpatient Prescriptions  Medication Sig Dispense Refill  . albuterol (PROVENTIL HFA;VENTOLIN HFA) 108 (90 Base) MCG/ACT inhaler Inhale 2 puffs into the lungs every 4 (four) hours as needed for wheezing. 2 Inhaler 12  . aspirin 325 MG EC tablet Take 325 mg by mouth daily.    . budesonide-formoterol (SYMBICORT) 160-4.5 MCG/ACT inhaler Inhale 2 puffs into the lungs 2 (two) times daily.    Marland Kitchen ezetimibe-simvastatin (VYTORIN) 10-40 MG tablet TAKE 1 TABLET BY MOUTH AT BEDTIME 90 tablet 1  . fenofibrate 160 MG tablet TAKE 1 TABLET BY MOUTH EVERY DAY 90 tablet 1  . fish oil-omega-3 fatty acids 1000 MG capsule Take 2 g by mouth daily.    . metoprolol (LOPRESSOR) 50 MG tablet Take 75 mg by mouth 2 (two) times daily.    . valsartan (DIOVAN) 160 MG tablet Take 160 mg by mouth daily.    Marland Kitchen zolpidem (AMBIEN) 10 MG tablet TAKE 1 TABLET BY MOUTH AT BEDTIME AS NEEDED FOR SLEEP 90 tablet 1  . apixaban (ELIQUIS) 5 MG TABS tablet Take 1 tablet (5 mg total) by mouth 2 (two) times daily. (Patient not taking: Reported on 11/05/2016) 60 tablet 6   No current facility-administered medications for this visit.      Past Medical History:  Diagnosis Date  . Atrial flutter ([place])    Diagnosed by ECG August 2015  . Colitis   . COPD (chronic obstructive pulmonary  disease) ([place])   . Coronary atherosclerosis of native coronary artery    Multivessel status post CABG  . Essential hypertension, benign   . Hyperlipidemia   . Insomnia     ROS:   All systems reviewed and negative except as noted in the HPI.   Past Surgical History:  Procedure Laterality Date  . COLONOSCOPY WITH PROPOFOL N/A 08/30/2015   Procedure: COLONOSCOPY WITH PROPOFOL at cecum at 0814; withdrawal time=8 minutes;  Surgeon: Daneil Dolin, MD;  Location: [facility];  Service: Endoscopy;  Laterality: N/A;  . CORONARY ARTERY BYPASS GRAFT  2005   Dr. Lucianne Lei Trigt: LIMA to LAD, right radial to circumflex, SVG to RCA  . CORONARY ARTERY BYPASS GRAFT  10/16/2004     Family History  Problem Relation Age of Onset  . Hypertension Mother   . Colon cancer Neg Hx      Social History   Social History  . Marital status: Married    Spouse name: N/A  . Number of children: N/A  . Years of education: N/A   Occupational History  . Not on file.   Social History Main Topics  . Smoking status: Former Smoker    Packs/day: 2.00    Years: 27.00    Types: Cigarettes    Start date:  02/01/1971    Quit date: 01/31/1998  . Smokeless tobacco: Current User    Types: Chew  . Alcohol use 0.0 oz/week     Comment: Drinks beer daily, typically 2-8 a day. Also 4 oz red wine an evening.  . Drug use: No  . Sexual activity: Not on file   Other Topics Concern  . Not on file   Social History Narrative  . No narrative on file     BP 112/82   Pulse 73   Ht 6\' 1"  (1.854 m)   Wt 218 lb 3.2 oz (99 kg)   BMI 28.79 kg/m   Physical Exam:  Well appearing 65 yo man, NAD HEENT: Unremarkable Neck:  6 cm JVD, no thyromegally Lymphatics:  No adenopathy Back:  No CVA tenderness Lungs:  Clear with no wheezes, rales or rhonchi HEART:  IRegular rate rhythm, no murmurs, no rubs, no clicks Abd:  soft, positive bowel sounds, no organomegally, no rebound, no guarding Ext:  2 plus pulses, no edema, no  cyanosis, no clubbing Skin:  No rashes no nodules Neuro:  CN II through XII intact, motor grossly intact  EKG - typical atrial flutter with a  CVR   Assess/Plan: 1. Atrial flutter - he has some symptoms and would like not to take long term systemic anti-coagulation. I have discussed the treatment options in detail. The risks/benefits/goals/expectations of EP study and ablation of atrial flutter was reviewed with his wife and he wishes to proceed. 2. CAD - he denies all anginal symptoms. 3. HTN - his blood pressue is well controlled. He will continue his current meds. 4. Coags - he has not started his Eliquis. I have instructed him to do so. He wants to have his ablation before the end of the year. He will need a TEE guided DCCV before the ablation.  [name].D.

## 2016-11-21 NOTE — Progress Notes (Signed)
  Echocardiogram Echocardiogram Transesophageal has been performed.  Dennis [name] 11/21/2016, 9:51 AM

## 2016-11-21 NOTE — Progress Notes (Signed)
Site area: rt IJ Site Prior to Removal:  Level 0 Pressure Applied For: 10 minutes Manual:   yes Patient Status During Pull:  stable Post Pull Site:  Level 0 Post Pull Instructions Given:  yes Post Pull Pulses Present: yes Dressing Applied:  Gauze/tegaderm Bedrest begins @  Comments:

## 2016-11-21 NOTE — Progress Notes (Signed)
Site area: rt groin Site Prior to Removal:  Level 0 Pressure Applied For: 15 minutes Manual:   yes Patient Status During Pull:  stable Post Pull Site:  Level 0 Post Pull Instructions Given:  yes Post Pull Pulses Present: yes Dressing Applied:  yes Bedrest begins @ 1425 Comments:  IV saline locked

## 2016-11-21 NOTE — Op Note (Signed)
INDICATIONS: atrial flutter pre-ablation  PROCEDURE:   Informed consent was obtained prior to the procedure. The risks, benefits and alternatives for the procedure were discussed and the patient comprehended these risks.  Risks include, but are not limited to, cough, sore throat, vomiting, nausea, somnolence, esophageal and stomach trauma or perforation, bleeding, low blood pressure, aspiration, pneumonia, infection, trauma to the teeth and death.    After a procedural time-out, the oropharynx was anesthetized with 20% benzocaine spray.   During this procedure the patient was administered a total of Versed 5 mg and Fentanyl 75 mcg to achieve and maintain moderate conscious sedation.  The patient's heart rate, blood pressure, and oxygen saturationweare monitored continuously during the procedure. The period of conscious sedation was 15 minutes, of which I was present face-to-face 100% of this time.  The transesophageal probe was inserted in the esophagus and stomach without difficulty and multiple views were obtained.  The patient was kept under observation until the patient left the procedure room.  The patient left the procedure room in stable condition.   Agitated microbubble saline contrast was not administered.  COMPLICATIONS:    There were no immediate complications.  FINDINGS:  No LA thrombus. Mild-moderate plaque in the descending aorta.  RECOMMENDATIONS:   Proceed with RF ablation.  Time Spent Directly with the Patient:  35 minutes   Dennis [name] 11/21/2016, 9:47 AM

## 2016-11-21 NOTE — Interval H&P Note (Signed)
History and Physical Interval Note:  11/21/2016 8:58 AM  Dennis [name]  has presented today for surgery, with the diagnosis of AFLUTTER  The various methods of treatment have been discussed with the patient and family. After consideration of risks, benefits and other options for treatment, the patient has consented to  Procedure(s): TRANSESOPHAGEAL ECHOCARDIOGRAM (TEE) (N/A) as a surgical intervention .  The patient's history has been reviewed, patient examined, no change in status, stable for surgery.  I have reviewed the patient's chart and labs.  Questions were answered to the patient's satisfaction.     Morrie Daywalt

## 2016-11-24 ENCOUNTER — Encounter (HOSPITAL_COMMUNITY): Payer: Self-pay | Admitting: Cardiovascular Disease

## 2016-11-26 ENCOUNTER — Other Ambulatory Visit: Payer: Self-pay | Admitting: Family Medicine

## 2016-11-28 ENCOUNTER — Ambulatory Visit: Payer: BLUE CROSS/BLUE SHIELD | Admitting: Cardiology

## 2016-12-01 ENCOUNTER — Telehealth: Payer: Self-pay | Admitting: Family Medicine

## 2016-12-01 ENCOUNTER — Telehealth: Payer: Self-pay | Admitting: Cardiology

## 2016-12-01 ENCOUNTER — Encounter: Payer: Self-pay | Admitting: Family Medicine

## 2016-12-01 ENCOUNTER — Ambulatory Visit (INDEPENDENT_AMBULATORY_CARE_PROVIDER_SITE_OTHER): Payer: BLUE CROSS/BLUE SHIELD | Admitting: Family Medicine

## 2016-12-01 VITALS — BP 132/86 | Ht 73.0 in | Wt 220.6 lb

## 2016-12-01 DIAGNOSIS — Z79899 Other long term (current) drug therapy: Secondary | ICD-10-CM

## 2016-12-01 DIAGNOSIS — F064 Anxiety disorder due to known physiological condition: Secondary | ICD-10-CM | POA: Diagnosis not present

## 2016-12-01 DIAGNOSIS — I10 Essential (primary) hypertension: Secondary | ICD-10-CM

## 2016-12-01 DIAGNOSIS — E785 Hyperlipidemia, unspecified: Secondary | ICD-10-CM

## 2016-12-01 DIAGNOSIS — E7800 Pure hypercholesterolemia, unspecified: Secondary | ICD-10-CM | POA: Diagnosis not present

## 2016-12-01 MED ORDER — ALPRAZOLAM 0.5 MG PO TABS: 0.5000 mg | ORAL_TABLET | Freq: Every day | ORAL | 1 refills | Status: DC | PRN

## 2016-12-01 MED ORDER — METOPROLOL TARTRATE 50 MG PO TABS: 50.0000 mg | ORAL_TABLET | Freq: Two times a day (BID) | ORAL | 1 refills | Status: DC

## 2016-12-01 NOTE — Telephone Encounter (Signed)
Patient has appointment for 6 month follow up feb.8 and needing lab paper done.

## 2016-12-01 NOTE — Telephone Encounter (Signed)
Pt stated that his blood pressure has been up a little more than usual for the past few days. ( 148/89, 173/86, 162/83, 165/90 ) He has taken an extra half of his medication and it seems to bring it back down to where it is suppose to be. (128/68) His heart rate is staying in the high 50's to low 60's . He stated that he is having a weird feeling in his head which is why he has started taking his bp more often, thinking his bp is the cause. He is also having sensitivity to light and sound, but no actual headaches. He did state that he fell off of a ladder a month ago and hit his head pretty hard. I did advise him to call his pcp doctor and discuss that with them. He has an appointment scheduled for 12/03/16 @ 9:20 to discuss his bp.

## 2016-12-01 NOTE — Telephone Encounter (Signed)
Orders ready. Pt notified.  

## 2016-12-01 NOTE — Telephone Encounter (Signed)
Pt called stating he's having high blood pressure and his heads feeling funny, almost called 911 last night

## 2016-12-01 NOTE — Telephone Encounter (Signed)
Lip liv 

## 2016-12-01 NOTE — Progress Notes (Signed)
   Subjective:    Patient ID: Dennis [name], male    DOB: 07-Apr-1951, 66 y.o.   MRN: [id number] Patient presents with rather complicated features HPI Patient with episode last night where [name] pressure was high and he was experiencing sensitivity to light and sound.   Patient had cardiac ablation 9 days ago and cant get in with cardiology till Wednesday but they stated possibility of migraine   Pt had ablation, felt the procedure had helped, went back into sinus rhytnm, and was happy that the procedure seemed to work  Taking half per day of the valsartan since his ablation.  Patient admits to feeling stressed and anxious at times. Next  Patient notes sweating of palms. He was worried this was coming from a cardiac etiology.  Admits to being irritable also.  Adjusted the [name] pressure medication on his own.  Due to high sensitivity to sound and light the cardiology nurse brought up the question of migraine. Patient has never had migraine with headache. 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   Review of Systems No headache, no major weight loss or weight gain, no chest pain no back pain abdominal pain no change in bowel habits complete ROS otherwise negative     Objective:   Physical Exam  Alert vitals stable, NAD. [name] pressure good on repeat. HEENT normal. Lungs clear. Heart regular rate and rhythm.       Assessment & Plan:  Impression 1 hypertension transiently subtherapeutic due to noncompliance patient change medicine on his own. Encouraged to get back to full dose of valsartan No. 2 status post ablation discussed at length #3 likely anxiety/stress with secondary physical features also discussed length. Patient willing to admit this could well be a problem plan Xanax initiate appropriate use discussed. Maintain valsartan at full dose. Warning signs discussed easily 25 minutes spent most in discussion

## 2016-12-03 ENCOUNTER — Ambulatory Visit: Payer: BLUE CROSS/BLUE SHIELD | Admitting: Cardiology

## 2016-12-05 ENCOUNTER — Telehealth: Payer: Self-pay | Admitting: Internal Medicine

## 2016-12-05 NOTE — Telephone Encounter (Signed)
Patient called in.  Patient states that he is having a little "fuzziness" in his head since his ablation. He also says that he has more sensitivity around his heart since the ablation. He wonders if he is over focusing on these issues since the ablation.  He talked to his PCP last week.  PCP prescribed Xanax, but he hasn't taken any.  I told patient that some folks describe a "fuzziness" in their head after coming out of anesthesia that goes away with time. He has an appt with Dr Lovena Le on 12-31-16.  I asked if he would like to come in earlier than that and see an extender.  He said that he would rather wait to see Dr Lovena Le.

## 2016-12-05 NOTE — Telephone Encounter (Signed)
New Message  Pt c/o of Chest Pain: STAT if CP now or developed within 24 hours  1. Are you having CP right now? Not right now  2. Are you experiencing any other symptoms (ex. SOB, nausea, vomiting, sweating)? Sweaty palms, feet, head does not feel right. Went to PCP as described wondering if it's anxiety.  3. How long have you been experiencing CP? Today  4. Is your CP continuous or coming and going? Coming and going more often prior to procedure.  5. Have you taken Nitroglycerin? No ?

## 2016-12-09 NOTE — Telephone Encounter (Signed)
Both sets of symptoms are almost certain to improve with time. GT

## 2016-12-12 NOTE — Telephone Encounter (Signed)
Called, pt unavailable. Spoke with pt's wife, Sonia Baller (on [place]). Informed Dr. Lovena Le stated symptoms are almost certain to improve. Pt should keep next appt. Pt should call our office if symptoms get worse or do not improve prior to next appt on 12/31/16.

## 2016-12-16 LAB — HEPATIC FUNCTION PANEL
ALBUMIN: 4.3 g/dL (ref 3.6–4.8)
ALT: 24 IU/L (ref 0–44)
AST: 29 IU/L (ref 0–40)
Alkaline Phosphatase: 29 IU/L — ABNORMAL LOW (ref 39–117)
Bilirubin Total: 0.5 mg/dL (ref 0.0–1.2)
Bilirubin, Direct: 0.13 mg/dL (ref 0.00–0.40)
TOTAL PROTEIN: 6.8 g/dL (ref 6.0–8.5)

## 2016-12-16 LAB — LIPID PANEL
CHOL/HDL RATIO: 7 ratio — AB (ref 0.0–5.0)
Cholesterol, Total: 302 mg/dL — ABNORMAL HIGH (ref 100–199)
HDL: 43 mg/dL (ref >39)
LDL CALC: 191 mg/dL — AB (ref 0–99)
Triglycerides: 338 mg/dL — ABNORMAL HIGH (ref 0–149)
VLDL CHOLESTEROL CAL: 68 mg/dL — AB (ref 5–40)

## 2016-12-17 ENCOUNTER — Encounter: Payer: Self-pay | Admitting: Family Medicine

## 2016-12-30 ENCOUNTER — Telehealth: Payer: Self-pay | Admitting: Cardiology

## 2016-12-30 MED ORDER — SIMVASTATIN 40 MG PO TABS: 40.0000 mg | ORAL_TABLET | Freq: Every day | ORAL | 3 refills | Status: DC

## 2016-12-30 NOTE — Addendum Note (Signed)
Addended by: Barbarann Ehlers A on: 12/30/2016 10:06 AM   Modules accepted: Orders

## 2016-12-30 NOTE — Telephone Encounter (Signed)
Is there another medication other than Vytorin patient can take ? We can see if he qualifies for patient assistance for Eliquis

## 2016-12-30 NOTE — Telephone Encounter (Signed)
Dr. [name] has had him on Vytorin. I would be okay with him switching to generic simvastatin 40 mg daily which should be cheaper option.

## 2016-12-30 NOTE — Telephone Encounter (Signed)
Pt is having a hard time affording his ezetimibe-simvastatin (VYTORIN) 10-40 MG tablet [id number] apixaban (ELIQUIS) 5 MG TABS tablet [id number] would like to know if he could get some samples

## 2016-12-30 NOTE — Telephone Encounter (Signed)
Lm that vytorin was d/c'd.simvastatin 40 mg escribed to pharmacy.I asked pt to bring financial proof of income and fill out form for pt assistance for eliquis

## 2016-12-31 ENCOUNTER — Encounter: Payer: Self-pay | Admitting: Internal Medicine

## 2016-12-31 ENCOUNTER — Ambulatory Visit (INDEPENDENT_AMBULATORY_CARE_PROVIDER_SITE_OTHER): Payer: BLUE CROSS/BLUE SHIELD | Admitting: Internal Medicine

## 2016-12-31 ENCOUNTER — Encounter (INDEPENDENT_AMBULATORY_CARE_PROVIDER_SITE_OTHER): Payer: Self-pay

## 2016-12-31 VITALS — BP 110/88 | HR 62 | Ht 73.0 in | Wt 225.6 lb

## 2016-12-31 DIAGNOSIS — I483 Typical atrial flutter: Secondary | ICD-10-CM

## 2016-12-31 MED ORDER — ASPIRIN EC 81 MG PO TBEC: 81.0000 mg | DELAYED_RELEASE_TABLET | Freq: Every day | ORAL | 11 refills | Status: DC

## 2016-12-31 NOTE — Patient Instructions (Addendum)
Medication Instructions:  Your physician has recommended you make the following change in your medication:  1) STOP Eliquis 2) START Aspirin 81 mg daily   Labwork: None Ordered   Testing/Procedures: None Ordered   Follow-Up: Follow-up with Dr. Lovena Le as needed  Any Other Special Instructions Will Be Listed Below (If Applicable).     If you need a refill on your cardiac medications before your next appointment, please call your pharmacy.

## 2016-12-31 NOTE — Progress Notes (Signed)
HPI [name] is returns today for ongoing followup, s/p atrial flutter RFA. The patient is a pleasant 66 yo man with CAD, s/p CABG who developed atrial flutter with a CVR and an RVR a couple of years ago. He rate had been controlled. He underwent catheter ablation several weeks ago. He now feels much better and denies any recurrent symptoms. Allergies  Allergen Reactions  . Ace Inhibitors Shortness Of Breath and Cough  . Enalapril Cough    Pt has been switched to Valsartan and is tolerating the different class.     Current Outpatient Prescriptions  Medication Sig Dispense Refill  . albuterol (PROVENTIL HFA;VENTOLIN HFA) 108 (90 Base) MCG/ACT inhaler Inhale 2 puffs into the lungs every 4 (four) hours as needed for wheezing. 2 Inhaler 12  . ALPRAZolam (XANAX) 0.5 MG tablet Take 1 tablet (0.5 mg total) by mouth daily as needed for anxiety. 24 tablet 1  . apixaban (ELIQUIS) 5 MG TABS tablet Take 1 tablet (5 mg total) by mouth 2 (two) times daily. 60 tablet 6  . budesonide-formoterol (SYMBICORT) 160-4.5 MCG/ACT inhaler Inhale 2 puffs into the lungs 2 (two) times daily.    . fenofibrate 160 MG tablet TAKE 1 TABLET BY MOUTH EVERY DAY 90 tablet 1  . fish oil-omega-3 fatty acids 1000 MG capsule Take 2 g by mouth daily.    . metoprolol (LOPRESSOR) 50 MG tablet Take 1 tablet (50 mg total) by mouth 2 (two) times daily. 180 tablet 1  . valsartan (DIOVAN) 160 MG tablet Take 160 mg by mouth daily.    Marland Kitchen zolpidem (AMBIEN) 10 MG tablet TAKE 1 TABLET BY MOUTH AT BEDTIME AS NEEDED FOR SLEEP 90 tablet 1  . simvastatin (ZOCOR) 40 MG tablet Take 1 tablet (40 mg total) by mouth at bedtime. (Patient not taking: Reported on 12/31/2016) 90 tablet 3   No current facility-administered medications for this visit.      Past Medical History:  Diagnosis Date  . Atrial flutter ([place])    Diagnosed by ECG August 2015  . Colitis   . COPD (chronic obstructive pulmonary disease) ([place])   . Coronary atherosclerosis  of native coronary artery    Multivessel status post CABG  . Essential hypertension, benign   . Hyperlipidemia   . Insomnia     ROS:   All systems reviewed and negative except as noted in the HPI.   Past Surgical History:  Procedure Laterality Date  . COLONOSCOPY WITH PROPOFOL N/A 08/30/2015   Procedure: COLONOSCOPY WITH PROPOFOL at cecum at 0814; withdrawal time=8 minutes;  Surgeon: Daneil Dolin, MD;  Location: [facility];  Service: Endoscopy;  Laterality: N/A;  . CORONARY ARTERY BYPASS GRAFT  2005   Dr. Lucianne Lei Trigt: LIMA to LAD, right radial to circumflex, SVG to RCA  . CORONARY ARTERY BYPASS GRAFT  10/16/2004  . ELECTROPHYSIOLOGIC STUDY N/A 11/21/2016   Procedure: A-Flutter Ablation;  Surgeon: Evans Lance, MD;  Location: [facility];  Service: Cardiovascular;  Laterality: N/A;  . TEE WITHOUT CARDIOVERSION N/A 11/21/2016   Procedure: TRANSESOPHAGEAL ECHOCARDIOGRAM (TEE);  Surgeon: Sanda Klein, MD;  Location: [facility];  Service: Cardiovascular;  Laterality: N/A;     Family History  Problem Relation Age of Onset  . Hypertension Mother   . Colon cancer Neg Hx      Social History   Social History  . Marital status: Married    Spouse name: N/A  . Number of children: N/A  . Years  of education: N/A   Occupational History  . Not on file.   Social History Main Topics  . Smoking status: Former Smoker    Packs/day: 2.00    Years: 27.00    Types: Cigarettes    Start date: 02/01/1971    Quit date: 01/31/1998  . Smokeless tobacco: Current User    Types: Chew  . Alcohol use 0.0 oz/week     Comment: Drinks beer daily, typically 2-8 a day. Also 4 oz red wine an evening.  . Drug use: No  . Sexual activity: Not on file   Other Topics Concern  . Not on file   Social History Narrative  . No narrative on file     Ht 6\' 1"  (1.854 m)   Wt 225 lb 9.6 oz (102.3 kg)   BMI 29.76 kg/m   Physical Exam:  Well appearing 66 yo man, NAD HEENT: Unremarkable Neck:   6 cm JVD, no thyromegally Lymphatics:  No adenopathy Back:  No CVA tenderness Lungs:  Clear with no wheezes, rales or rhonchi HEART:  IRegular rate rhythm, no murmurs, no rubs, no clicks Abd:  soft, positive bowel sounds, no organomegally, no rebound, no guarding Ext:  2 plus pulses, no edema, no cyanosis, no clubbing Skin:  No rashes no nodules Neuro:  CN II through XII intact, motor grossly intact  EKG - NSR   Assess/Plan: 1. Atrial flutter - he has done well since catheter ablation of atrial flutter. He will stop his Eliquis. 2. CAD - he denies all anginal symptoms. 3. HTN - his blood pressue is well controlled. He will continue his current meds. 4. Coags - he will stop his Eliquis. I think he can take an 81 mg of ASA.  [name].D.

## 2017-01-01 ENCOUNTER — Ambulatory Visit (INDEPENDENT_AMBULATORY_CARE_PROVIDER_SITE_OTHER): Payer: BLUE CROSS/BLUE SHIELD | Admitting: Family Medicine

## 2017-01-01 ENCOUNTER — Encounter: Payer: Self-pay | Admitting: Family Medicine

## 2017-01-01 VITALS — BP 118/72 | Ht 73.0 in | Wt 223.4 lb

## 2017-01-01 DIAGNOSIS — E785 Hyperlipidemia, unspecified: Secondary | ICD-10-CM

## 2017-01-01 DIAGNOSIS — I10 Essential (primary) hypertension: Secondary | ICD-10-CM | POA: Diagnosis not present

## 2017-01-01 DIAGNOSIS — I483 Typical atrial flutter: Secondary | ICD-10-CM | POA: Diagnosis not present

## 2017-01-01 MED ORDER — EZETIMIBE-SIMVASTATIN 10-40 MG PO TABS: 1.0000 | ORAL_TABLET | Freq: Every day | ORAL | 2 refills | Status: DC

## 2017-01-01 NOTE — Progress Notes (Signed)
   Subjective:    Patient ID: Dennis [name], male    DOB: 1951-08-07, 66 y.o.   MRN: [id number]  Hypertension  This is a chronic problem. The current episode started more than 1 year ago. Risk factors for coronary artery disease include dyslipidemia and male gender. Treatments tried: diovan, metoprolol. There are no compliance problems.    Discuss recent labs-discuss cholesterol meds  Just saw cardiologist ,   Blood pressure medicine and blood pressure levels reviewed today with patient. Compliant with blood pressure medicine. States does not miss a dose. No obvious side effects. Blood pressure generally good when checked elsewhere. Watching salt intake.  Numbers overall good   Patient continues to take lipid medication regularly. No obvious side effects from it. Generally does not miss a dose. Prior blood work results are reviewed with patient. Patient continues to work on fat intake in diet   Not exercising much  Review of Systems No headache, no major weight loss or weight gain, no chest pain no back pain abdominal pain no change in bowel habits complete ROS otherwise negative     Objective:   Physical Exam Alert vitals stable, NAD. Blood pressure good on repeat. HEENT normal. Lungs clear. Heart regular rate and rhythm.        Assessment & Plan:  Impression hypertension good control discussed maintain same meds #2 atrial flutter now post ablation handling well discussed with patient #3 hyperlipidemia ongoing challenge discussed patient would like to get back to Vytorin which I think is reasonable. #4 insomnia ongoing discussed plan maintain all medications. Resume Vytorin diet exercise discussed in encourage many aspects of patient's general care discussed 25 minutes spent greater than 50% discussed

## 2017-01-01 NOTE — Patient Instructions (Signed)
Results for orders placed or performed in visit on 12/01/16  Lipid panel  Result Value Ref Range   Cholesterol, Total 302 (H) 100 - 199 mg/dL   Triglycerides 338 (H) 0 - 149 mg/dL   HDL 43 >39 mg/dL   VLDL Cholesterol Cal 68 (H) 5 - 40 mg/dL   LDL Calculated 191 (H) 0 - 99 mg/dL   Chol/HDL Ratio 7.0 (H) 0.0 - 5.0 ratio units  Hepatic function panel  Result Value Ref Range   Total Protein 6.8 6.0 - 8.5 g/dL   Albumin 4.3 3.6 - 4.8 g/dL   Bilirubin Total 0.5 0.0 - 1.2 mg/dL   Bilirubin, Direct 0.13 0.00 - 0.40 mg/dL   Alkaline Phosphatase 29 (L) 39 - 117 IU/L   AST 29 0 - 40 IU/L   ALT 24 0 - 44 IU/L

## 2017-01-16 ENCOUNTER — Other Ambulatory Visit: Payer: Self-pay | Admitting: Internal Medicine

## 2017-01-18 ENCOUNTER — Other Ambulatory Visit: Payer: Self-pay | Admitting: Family Medicine

## 2017-01-19 NOTE — Telephone Encounter (Signed)
Six mo ok 

## 2017-01-20 DIAGNOSIS — H833X3 Noise effects on inner ear, bilateral: Secondary | ICD-10-CM | POA: Insufficient documentation

## 2017-01-20 DIAGNOSIS — H9313 Tinnitus, bilateral: Secondary | ICD-10-CM | POA: Insufficient documentation

## 2017-03-17 ENCOUNTER — Other Ambulatory Visit: Payer: Self-pay | Admitting: Internal Medicine

## 2017-03-17 ENCOUNTER — Other Ambulatory Visit: Payer: Self-pay | Admitting: Family Medicine

## 2017-04-27 ENCOUNTER — Ambulatory Visit (INDEPENDENT_AMBULATORY_CARE_PROVIDER_SITE_OTHER): Payer: BLUE CROSS/BLUE SHIELD | Admitting: Family Medicine

## 2017-04-27 ENCOUNTER — Encounter: Payer: Self-pay | Admitting: Family Medicine

## 2017-04-27 VITALS — BP 122/72 | Temp 98.4°F | Ht 73.0 in | Wt 222.4 lb

## 2017-04-27 DIAGNOSIS — R21 Rash and other nonspecific skin eruption: Secondary | ICD-10-CM | POA: Diagnosis not present

## 2017-04-27 DIAGNOSIS — I10 Essential (primary) hypertension: Secondary | ICD-10-CM

## 2017-04-27 DIAGNOSIS — E785 Hyperlipidemia, unspecified: Secondary | ICD-10-CM

## 2017-04-27 DIAGNOSIS — Z125 Encounter for screening for malignant neoplasm of prostate: Secondary | ICD-10-CM

## 2017-04-27 MED ORDER — ALPRAZOLAM 0.5 MG PO TABS: 0.5000 mg | ORAL_TABLET | Freq: Every day | ORAL | 0 refills | Status: DC | PRN

## 2017-04-27 MED ORDER — KETOCONAZOLE 2 % EX CREA: TOPICAL_CREAM | CUTANEOUS | 3 refills | Status: DC

## 2017-04-27 NOTE — Progress Notes (Signed)
   Subjective:    Patient ID: Dennis [name], male    DOB: 15-Dec-1950, 66 y.o.   MRN: [id number] Patient arrives office with numerous concerns Rash  This is a recurrent problem. The current episode started more than 1 month ago. The affected locations include the genitalia and groin. Past treatments include antibiotic cream and anti-itch cream.   Pt has hx of rash and irrit for the past eoght mpnths  Still experiencing redness despite hydrocort and  Lotrimin    Patient would also like to discuss blood pressure refills . Was switched to valsartan by Dr. Alvina Filbert. Patient continues to take. Tolerating well. Blood pressure is generally good. Patient wonders if we can manage his blood pressure going forward again. Watching salt intake.  and refill on Xanax. Uses Xanax intermittently and rarely. No significant negative side effects. Handles well. Definitely helps when he needs   Review of Systems  Skin: Positive for rash.  No headache, no major weight loss or weight gain, no chest pain no back pain abdominal pain no change in bowel habits complete ROS otherwise negative      Objective:   Physical Exam  Alert and oriented, vitals reviewed and stable, NAD ENT-TM's and ext canals WNL bilat via otoscopic exam Soft palate, tonsils and post pharynx WNL via oropharyngeal exam Neck-symmetric, no masses; thyroid nonpalpable and nontender Pulmonary-no tachypnea or accessory muscle use; Clear without wheezes via auscultation Card--no abnrml murmurs, rhythm reg and rate WNL Carotid pulses symmetric, without bruits Patchy fungal-like hypertrophic rash groin right side more than left side      Assessment & Plan:  Impression 1 hypertension good control discussed maintain same meds #2 chronic anxiety with occasional need for Xanax discussed meds refilled #3 rash. Likely tenia cruris discussed. Not responding Lotrimin. Will initiate ketoconazole cream twice a day to affected area. Symptom care  discussed

## 2017-05-28 ENCOUNTER — Other Ambulatory Visit: Payer: Self-pay | Admitting: Family Medicine

## 2017-06-03 LAB — LIPID PANEL
CHOLESTEROL TOTAL: 269 mg/dL — AB (ref 100–199)
Chol/HDL Ratio: 6.9 ratio — ABNORMAL HIGH (ref 0.0–5.0)
HDL: 39 mg/dL — AB (ref >39)
LDL CALC: 163 mg/dL — AB (ref 0–99)
TRIGLYCERIDES: 337 mg/dL — AB (ref 0–149)
VLDL CHOLESTEROL CAL: 67 mg/dL — AB (ref 5–40)

## 2017-06-03 LAB — HEPATIC FUNCTION PANEL
ALT: 22 IU/L (ref 0–44)
AST: 34 IU/L (ref 0–40)
Albumin: 4.2 g/dL (ref 3.6–4.8)
Alkaline Phosphatase: 26 IU/L — ABNORMAL LOW (ref 39–117)
Bilirubin Total: 0.3 mg/dL (ref 0.0–1.2)
Bilirubin, Direct: 0.14 mg/dL (ref 0.00–0.40)
Total Protein: 6.6 g/dL (ref 6.0–8.5)

## 2017-06-03 LAB — BASIC METABOLIC PANEL
BUN/Creatinine Ratio: 15 (ref 10–24)
BUN: 19 mg/dL (ref 8–27)
CALCIUM: 9.4 mg/dL (ref 8.6–10.2)
CHLORIDE: 102 mmol/L (ref 96–106)
CO2: 25 mmol/L (ref 20–29)
Creatinine, Ser: 1.24 mg/dL (ref 0.76–1.27)
GFR calc Af Amer: 71 mL/min/{1.73_m2} (ref >59)
GFR calc non Af Amer: 61 mL/min/{1.73_m2} (ref >59)
GLUCOSE: 98 mg/dL (ref 65–99)
POTASSIUM: 5 mmol/L (ref 3.5–5.2)
Sodium: 141 mmol/L (ref 134–144)

## 2017-06-03 LAB — PSA: PROSTATE SPECIFIC AG, SERUM: 0.9 ng/mL (ref 0.0–4.0)

## 2017-06-07 ENCOUNTER — Encounter: Payer: Self-pay | Admitting: Family Medicine

## 2017-06-12 ENCOUNTER — Other Ambulatory Visit: Payer: Self-pay | Admitting: Family Medicine

## 2017-07-01 ENCOUNTER — Ambulatory Visit: Payer: BLUE CROSS/BLUE SHIELD | Admitting: Family Medicine

## 2017-07-09 ENCOUNTER — Encounter: Payer: Self-pay | Admitting: Family Medicine

## 2017-07-09 ENCOUNTER — Other Ambulatory Visit: Payer: Self-pay | Admitting: Family Medicine

## 2017-07-09 ENCOUNTER — Ambulatory Visit (INDEPENDENT_AMBULATORY_CARE_PROVIDER_SITE_OTHER): Payer: BLUE CROSS/BLUE SHIELD | Admitting: Family Medicine

## 2017-07-09 VITALS — BP 120/86 | Ht 73.0 in | Wt 224.1 lb

## 2017-07-09 DIAGNOSIS — R21 Rash and other nonspecific skin eruption: Secondary | ICD-10-CM

## 2017-07-09 DIAGNOSIS — F5101 Primary insomnia: Secondary | ICD-10-CM | POA: Diagnosis not present

## 2017-07-09 DIAGNOSIS — Z0000 Encounter for general adult medical examination without abnormal findings: Secondary | ICD-10-CM | POA: Diagnosis not present

## 2017-07-09 DIAGNOSIS — E7800 Pure hypercholesterolemia, unspecified: Secondary | ICD-10-CM

## 2017-07-09 DIAGNOSIS — J449 Chronic obstructive pulmonary disease, unspecified: Secondary | ICD-10-CM | POA: Diagnosis not present

## 2017-07-09 DIAGNOSIS — E785 Hyperlipidemia, unspecified: Secondary | ICD-10-CM | POA: Diagnosis not present

## 2017-07-09 DIAGNOSIS — N411 Chronic prostatitis: Secondary | ICD-10-CM | POA: Diagnosis not present

## 2017-07-09 DIAGNOSIS — I10 Essential (primary) hypertension: Secondary | ICD-10-CM | POA: Diagnosis not present

## 2017-07-09 MED ORDER — ALPRAZOLAM 0.5 MG PO TABS: 0.5000 mg | ORAL_TABLET | Freq: Every day | ORAL | 0 refills | Status: DC | PRN

## 2017-07-09 MED ORDER — LOSARTAN POTASSIUM 100 MG PO TABS: ORAL_TABLET | ORAL | 1 refills | Status: DC

## 2017-07-09 MED ORDER — CLOTRIMAZOLE-BETAMETHASONE 1-0.05 % EX CREA: TOPICAL_CREAM | CUTANEOUS | 3 refills | Status: DC

## 2017-07-09 MED ORDER — FENOFIBRATE 160 MG PO TABS
160.0000 mg | ORAL_TABLET | Freq: Every day | ORAL | 1 refills | Status: DC
Start: 2017-07-09 — End: 2018-04-29

## 2017-07-09 MED ORDER — ZOLPIDEM TARTRATE 10 MG PO TABS: 10.0000 mg | ORAL_TABLET | Freq: Every evening | ORAL | 1 refills | Status: DC | PRN

## 2017-07-09 MED ORDER — EZETIMIBE-SIMVASTATIN 10-40 MG PO TABS: 1.0000 | ORAL_TABLET | Freq: Every day | ORAL | 1 refills | Status: DC

## 2017-07-09 MED ORDER — CIPROFLOXACIN HCL 500 MG PO TABS: ORAL_TABLET | ORAL | 0 refills | Status: DC

## 2017-07-09 NOTE — Patient Instructions (Signed)
Results for orders placed or performed in visit on 04/27/17  Lipid panel  Result Value Ref Range   Cholesterol, Total 269 (H) 100 - 199 mg/dL   Triglycerides 337 (H) 0 - 149 mg/dL   HDL 39 (L) >39 mg/dL   VLDL Cholesterol Cal 67 (H) 5 - 40 mg/dL   LDL Calculated 163 (H) 0 - 99 mg/dL   Chol/HDL Ratio 6.9 (H) 0.0 - 5.0 ratio  Hepatic function panel  Result Value Ref Range   Total Protein 6.6 6.0 - 8.5 g/dL   Albumin 4.2 3.6 - 4.8 g/dL   Bilirubin Total 0.3 0.0 - 1.2 mg/dL   Bilirubin, Direct 0.14 0.00 - 0.40 mg/dL   Alkaline Phosphatase 26 (L) 39 - 117 IU/L   AST 34 0 - 40 IU/L   ALT 22 0 - 44 IU/L  Basic metabolic panel  Result Value Ref Range   Glucose 98 65 - 99 mg/dL   BUN 19 8 - 27 mg/dL   Creatinine, Ser 1.24 0.76 - 1.27 mg/dL   GFR calc non Af Amer 61 >59 mL/min/1.73   GFR calc Af Amer 71 >59 mL/min/1.73   BUN/Creatinine Ratio 15 10 - 24   Sodium 141 134 - 144 mmol/L   Potassium 5.0 3.5 - 5.2 mmol/L   Chloride 102 96 - 106 mmol/L   CO2 25 20 - 29 mmol/L   Calcium 9.4 8.6 - 10.2 mg/dL  PSA  Result Value Ref Range   Prostate Specific Ag, Serum 0.9 0.0 - 4.0 ng/mL

## 2017-07-09 NOTE — Progress Notes (Signed)
Subjective:    Patient ID: Dennis [name], male    DOB: Jun 22, 1951, 66 y.o.   MRN: [id number]  HPI The patient comes in today for a wellness visit.   Plus chronic health concerns    Patient continues to take lipid medication regularly. No obvious side effects from it. Generally does not miss a dose. Prior blood work results are reviewed with patient. Patient continues to work on fat intake in diet   Blood pressure medicine and blood pressure levels reviewed today with patient. Compliant with blood pressure medicine. States does not miss a dose. No obvious side effects. Blood pressure generally good when checked elsewhere. Watching salt intake.  Ongoing challenges with sleep. Definitely needs the Ambien to help him sleep at night. Uses Xanax intermittently   A review of their health history was completed.  A review of medications was also completed.  Any needed refills; Yes on Xanax   and ambien  Eating habits: Patient states eating habits are good.   Falls/  MVA accidents in past few months:  None  Regular exercise:  Patient states walks.  Specialist pt sees on regular basis: Pulmonary specialist, Cardiologist,   Preventative health issues were discussed.   Additional   :  Patient has shoulder   , Fungus in groin area.   Some urinary incontinence  . Pain in right groin.  Results for orders placed or performed in visit on 04/27/17  Lipid panel  Result Value Ref Range   Cholesterol, Total 269 (H) 100 - 199 mg/dL   Triglycerides 337 (H) 0 - 149 mg/dL   HDL 39 (L) >39 mg/dL   VLDL Cholesterol Cal 67 (H) 5 - 40 mg/dL   LDL Calculated 163 (H) 0 - 99 mg/dL   Chol/HDL Ratio 6.9 (H) 0.0 - 5.0 ratio  Hepatic function panel  Result Value Ref Range   Total Protein 6.6 6.0 - 8.5 g/dL   Albumin 4.2 3.6 - 4.8 g/dL   Bilirubin Total 0.3 0.0 - 1.2 mg/dL   Bilirubin, Direct 0.14 0.00 - 0.40 mg/dL   Alkaline Phosphatase 26 (L) 39 - 117 IU/L   AST 34 0 - 40 IU/L   ALT  22 0 - 44 IU/L  Basic metabolic panel  Result Value Ref Range   Glucose 98 65 - 99 mg/dL   BUN 19 8 - 27 mg/dL   Creatinine, Ser 1.24 0.76 - 1.27 mg/dL   GFR calc non Af Amer 61 >59 mL/min/1.73   GFR calc Af Amer 71 >59 mL/min/1.73   BUN/Creatinine Ratio 15 10 - 24   Sodium 141 134 - 144 mmol/L   Potassium 5.0 3.5 - 5.2 mmol/L   Chloride 102 96 - 106 mmol/L   CO2 25 20 - 29 mmol/L   Calcium 9.4 8.6 - 10.2 mg/dL  PSA  Result Value Ref Range   Prostate Specific Ag, Serum 0.9 0.0 - 4.0 ng/mL   Valsartan takes faithfully  Shoulder pain gradually worsening  Pt started having urinating issues the past six weeks, pt having moisture in the groin, now wearing boxer shorts Reminds patient and discomfort he had when he had prostate infection. Now some mild incontinence at times. Having a bit dribbble at night, gets up at night to urinate   Heart rate around sixty, handling it well,  Review of Systems  Constitutional: Negative for activity change, appetite change and fever.  HENT: Negative for congestion and rhinorrhea.   Eyes: Negative for discharge.  Respiratory: Negative  for cough and wheezing.   Cardiovascular: Negative for chest pain.  Gastrointestinal: Negative for abdominal pain, blood in stool and vomiting.  Genitourinary: Negative for difficulty urinating and frequency.  Musculoskeletal: Negative for neck pain.  Skin: Negative for rash.  Allergic/Immunologic: Negative for environmental allergies and food allergies.  Neurological: Negative for weakness and headaches.  Psychiatric/Behavioral: Negative for agitation.  All other systems reviewed and are negative.      Objective:   Physical Exam  Constitutional: He appears well-developed and well-nourished.  HENT:  Head: Normocephalic and atraumatic.  Right Ear: External ear normal.  Left Ear: External ear normal.  Nose: Nose normal.  Mouth/Throat: Oropharynx is clear and moist.  Eyes: Pupils are equal, round, and  reactive to light. EOM are normal.  Neck: Normal range of motion. Neck supple. No thyromegaly present.  Cardiovascular: Normal rate, regular rhythm and normal heart sounds.   No murmur heard. Pulmonary/Chest: Effort normal and breath sounds normal. No respiratory distress. He has no wheezes.  Abdominal: Soft. Bowel sounds are normal. He exhibits no distension and no mass. There is no tenderness.  Genitourinary: Penis normal.  Musculoskeletal: Normal range of motion. He exhibits no edema.  Lymphadenopathy:    He has no cervical adenopathy.  Neurological: He is alert. He exhibits normal muscle tone.  Skin: Skin is warm and dry. No erythema.  Psychiatric: He has a normal mood and affect. His behavior is normal. Judgment normal.   Prostate exam boggy and tender  Scrotum hypertrophic patchy rash.       Assessment & Plan:  Impression 1 wellness exam up to date on vaccines. Diet exercise discussed. Up-to-date on colonoscopy. #2 hypertension good control discussed maintain same meds #3 hyperlipidemia. Improved control for this patient. Discussed maintain same meds. #4 subacute prostatitis discussed will intervene #5 scrotal rash likely fungal element. Not responding well to ketoconazole will go up to Lotrisone. Proper use discussed. Follow-up in 6 months

## 2017-07-10 ENCOUNTER — Telehealth: Payer: Self-pay | Admitting: Family Medicine

## 2017-07-10 ENCOUNTER — Other Ambulatory Visit: Payer: Self-pay | Admitting: Family Medicine

## 2017-07-10 NOTE — Telephone Encounter (Addendum)
Med not on med list according to med history it was taken off med list in February and says reason is change in therapy. Patient states he is taking med- was seen yesterday by Dr Richardson Landry for check up ? Patient states he is taking med still?

## 2017-07-10 NOTE — Telephone Encounter (Signed)
Pt is wanting to when and why he was taken off of metoprolol. I told him according to the chart that it was stopped in feb. Pt states that he was never told to stop taking this medication. Pt is wanting someone to call him back on this. Please advise.

## 2017-07-10 NOTE — Telephone Encounter (Signed)
I think it is fine for the patient to continue with this medication into early next week. Explained to the patient his primary care doctor is not in the office today and that it would be best for Dr. Richardson Landry to review over this then decide what would be best for the patient.

## 2017-07-10 NOTE — Telephone Encounter (Signed)
Patient notified and will continue med until Dr Richardson Landry reviews(patient has a week of pills left)

## 2017-07-12 NOTE — Telephone Encounter (Signed)
Ok six mo worth 

## 2017-07-13 ENCOUNTER — Other Ambulatory Visit: Payer: Self-pay

## 2017-07-13 MED ORDER — METOPROLOL TARTRATE 50 MG PO TABS: 50.0000 mg | ORAL_TABLET | Freq: Two times a day (BID) | ORAL | 1 refills | Status: DC

## 2017-07-13 NOTE — Telephone Encounter (Signed)
Spoke with patient and informed him per Dr.Steve Luking- You definitely need to be on this medication with your history of heart disease and atrial flutter. Patient verbalized understanding. Medication added back to med list.

## 2017-07-13 NOTE — Telephone Encounter (Signed)
Yes pt should be definitely on this med with his hx of heart disease and atriak flutter, let him know and put bk on med list

## 2017-07-14 ENCOUNTER — Telehealth: Payer: Self-pay | Admitting: Cardiology

## 2017-07-14 NOTE — Telephone Encounter (Signed)
Looking back in the pt's records, Dr. Domenic Polite referred this pt to EP. He also wanted to see him back in 1 month. The pt has seen Dr. Lovena Le since, with no follow up with Dr. Domenic Polite. I will let front office know to schedule pt. For a follow up with Dr. Domenic Polite.

## 2017-07-14 NOTE — Telephone Encounter (Signed)
Patient is calling to ask if he needs any testing. He has no follow up scheduled. / tg

## 2017-07-22 ENCOUNTER — Other Ambulatory Visit: Payer: Self-pay | Admitting: *Deleted

## 2017-07-22 ENCOUNTER — Telehealth: Payer: Self-pay | Admitting: Family Medicine

## 2017-07-22 MED ORDER — LOSARTAN POTASSIUM-HCTZ 100-25 MG PO TABS: 1.0000 | ORAL_TABLET | Freq: Every day | ORAL | 0 refills | Status: DC

## 2017-07-22 NOTE — Telephone Encounter (Signed)
Discussed with pt. Pt verbalized understanding. Med sent to pharm and pt transferred to front to schedule office visit in 7 days with dr Richardson Landry

## 2017-07-22 NOTE — Telephone Encounter (Signed)
Please let the patient know that losartan is at the maximum dose-it is recommended to add a diuretic component. I recommend losartan 100/25, 1 daily, #30, recommend follow-up office visit with Dr. Richardson Landry in approximately 7 days-blood pressure will gradually improve

## 2017-07-22 NOTE — Telephone Encounter (Signed)
Patient said that Dr. Richardson Landry changed his BP medication to losartan a couple of weeks ago.  He said his BP is still running high, especially for the last 2-3 days.  Last night his BP was 170/94 and running the same this morning.  He wants to know if his dosage should be increased?

## 2017-07-22 NOTE — Telephone Encounter (Signed)
Was on valsartan 160 and changed to losartan 100 mg

## 2017-07-28 ENCOUNTER — Encounter: Payer: Self-pay | Admitting: Family Medicine

## 2017-07-28 ENCOUNTER — Ambulatory Visit (INDEPENDENT_AMBULATORY_CARE_PROVIDER_SITE_OTHER): Payer: BLUE CROSS/BLUE SHIELD | Admitting: Family Medicine

## 2017-07-28 VITALS — BP 124/80 | Ht 73.0 in | Wt 221.0 lb

## 2017-07-28 DIAGNOSIS — I10 Essential (primary) hypertension: Secondary | ICD-10-CM | POA: Diagnosis not present

## 2017-07-28 MED ORDER — LOSARTAN POTASSIUM-HCTZ 100-25 MG PO TABS: 1.0000 | ORAL_TABLET | Freq: Every day | ORAL | 5 refills | Status: DC

## 2017-07-28 MED ORDER — CIPROFLOXACIN HCL 500 MG PO TABS: ORAL_TABLET | ORAL | 0 refills | Status: DC

## 2017-07-28 NOTE — Patient Instructions (Signed)
lamisil otc, purchase this, use on the scrotal rash twice perday  If this cont to cause ongoing problems we will set you up with a dermatologist l

## 2017-07-28 NOTE — Progress Notes (Signed)
   Subjective:    Patient ID: Dennis [name], male    DOB: 28-Jul-1951, 66 y.o.   MRN: [id number]  Hypertension  This is a recurrent problem. The current episode started more than 1 year ago.  Patient states he was on Losartan and it was discontinued,and was placed on another medication.Wants to discuss this today. Patient eats healthy and exercises.  BP went definitely up on the losartan  Was sithed to losartan plus hctz   Patient also feels he had a reaction to the clotrimazole betamethasone combination cream. Wonders what else to put on his irritated scrotum.   Review of Systems No headache, no major weight loss or weight gain, no chest pain no back pain abdominal pain no change in bowel habits complete ROS otherwise negative     Objective:   Physical Exam  Alert vitals stable, NAD. Blood pressure good on repeat. HEENT normal. Lungs clear. Heart regular rate and rhythm.       Assessment & Plan:  Impression hypertension clinically improved on losartan hydrochlorothiazide combination. Patient feels he may be getting a few more cramps on this. Advised to continue same blood pressure medicine and increase potassium intake #2 recurrent groin rash is Lamisil cream over-the-counter and try #3 prostatitis patient states clinically improved but feels he needs a bit more antibiotics Cipro extended

## 2017-09-04 ENCOUNTER — Ambulatory Visit: Payer: BLUE CROSS/BLUE SHIELD | Admitting: Cardiology

## 2017-09-22 ENCOUNTER — Ambulatory Visit (INDEPENDENT_AMBULATORY_CARE_PROVIDER_SITE_OTHER): Payer: BLUE CROSS/BLUE SHIELD | Admitting: Family Medicine

## 2017-09-22 ENCOUNTER — Encounter: Payer: Self-pay | Admitting: Family Medicine

## 2017-09-22 VITALS — BP 118/78 | Ht 73.0 in | Wt 227.0 lb

## 2017-09-22 DIAGNOSIS — F064 Anxiety disorder due to known physiological condition: Secondary | ICD-10-CM

## 2017-09-22 DIAGNOSIS — F4310 Post-traumatic stress disorder, unspecified: Secondary | ICD-10-CM | POA: Diagnosis not present

## 2017-09-22 MED ORDER — ALPRAZOLAM 0.5 MG PO TABS: ORAL_TABLET | ORAL | 3 refills | Status: DC

## 2017-09-22 NOTE — Progress Notes (Signed)
   Subjective:    Patient ID: Dennis [name], male    DOB: 02-22-1951, 66 y.o.   MRN: [id number] Patient arrives for very protracted discussion. HPI  Patient is here today for a follow up on Hypertension and Anxiety.He is on Losartan 100-25 one daily and Metoprolol 50 one BID. Eats healthy and does not exercise due to COPD. He is currently on Xanax .5 mg one prn. States the xanax does help with his anxiety,but he is still having some issues.GAD given to the pt.   Ongoing challenges with nerves.  Upset all the time.  Periods of rapid heart rate.  Periods of shortness of breath.  Patient very afraid that his wife might pass away.  See prior notes.  Wife was involved in a life threatening situation post op last year.  Since then patient has dwelt on this and cannot get it out of his mind.  No suicidal thoughts.  Ongoing challenges with anxiety.  Nervousness.  Feeling down.  Not interested in all his usual activities.  States he needs some help.  Would like to see both a counselor and a psychiatrist   Review of Systems No headache, no major weight loss or weight gain, no chest pain no back pain abdominal pain no change in bowel habits complete ROS otherwise negative     Objective:   Physical Exam  Alert and oriented, vitals reviewed and stable, NAD ENT-TM's and ext canals WNL bilat via otoscopic exam Soft palate, tonsils and post pharynx WNL via oropharyngeal exam Neck-symmetric, no masses; thyroid nonpalpable and nontender Pulmonary-no tachypnea or accessory muscle use; Clear without wheezes via auscultation Card--no abnrml murmurs, rhythm reg and rate WNL Carotid pulses symmetric, without bruits       Assessment & Plan:  Impression 1 hypertension control improved discussed to maintain same  2.  Insomnia likely related to #3  3.  Main reason for patient's presentation.  Features suggestive of posttraumatic stress disorder.  Anxiety and depression.  Patient would like a team  approach which I think is warranted considering severity of symptoms.  Fortunately no suicidal thoughts.  We will press on with referral.  Greater than 50% of this 25 minute face to face visit was spent in counseling and discussion and coordination of care regarding the above diagnosis/diagnosies

## 2017-09-25 ENCOUNTER — Encounter: Payer: Self-pay | Admitting: Family Medicine

## 2017-10-05 NOTE — Progress Notes (Signed)
Cardiology Office Note  Date: 10/06/2017   ID: Dennis [name], DOB 1950/12/16, MRN [id number]  PCP: Mikey Kirschner, MD  Primary Cardiologist: Rozann Lesches, MD   Chief Complaint  Patient presents with  . Cardiac follow-up    History of Present Illness: Dennis [name] is a 66 y.o. male last seen in December 2017.  He was since referred to see Dr. Lovena Le for EP consultation and underwent RFA of atrial flutter.  He was taken off Eliquis.  Last visit with Dr. Lovena Le was in February.  He presents today with his wife for follow-up.  States that he feels much better overall since ablation.  He does not report any angina symptoms.  We went over his medications which are outlined below.  He reports compliance.  States that his blood pressure has been adequately controlled.  I did review his lipid numbers from July.  Still not optimally controlled in the setting of CAD.  He reports previous intolerance to Lipitor, has been taking Vytorin but still has some leg fatigue with this.  We did discuss trying Crestor.  Otherwise, he reports no palpitations or unusual shortness of breath.  No dizziness or syncope.  Past Medical History:  Diagnosis Date  . Atrial flutter ([place])    Diagnosed by ECG August 2015  . Colitis   . COPD (chronic obstructive pulmonary disease) ([place])   . Coronary atherosclerosis of native coronary artery    Multivessel status post CABG  . Essential hypertension, benign   . Hyperlipidemia   . Insomnia     Past Surgical History:  Procedure Laterality Date  . CORONARY ARTERY BYPASS GRAFT  2005   Dr. Lucianne Lei Trigt: LIMA to LAD, right radial to circumflex, SVG to RCA  . CORONARY ARTERY BYPASS GRAFT  10/16/2004    Current Outpatient Medications  Medication Sig Dispense Refill  . albuterol (PROVENTIL HFA;VENTOLIN HFA) 108 (90 Base) MCG/ACT inhaler Inhale 2 puffs into the lungs every 4 (four) hours as needed for wheezing. 2 Inhaler 12  . ALPRAZolam (XANAX) 0.5 MG  tablet Take one up to bid prn anxiety 30 tablet 3  . aspirin EC 81 MG tablet Take 1 tablet (81 mg total) by mouth daily. 30 tablet 11  . ezetimibe-simvastatin (VYTORIN) 10-40 MG tablet Take 1 tablet by mouth daily. 90 tablet 1  . fenofibrate 160 MG tablet Take 1 tablet (160 mg total) by mouth daily. 90 tablet 1  . fish oil-omega-3 fatty acids 1000 MG capsule Take 2 g by mouth daily.    Marland Kitchen losartan-hydrochlorothiazide (HYZAAR) 100-25 MG tablet Take 1 tablet by mouth daily. 30 tablet 5  . metoprolol tartrate (LOPRESSOR) 50 MG tablet Take 1 tablet (50 mg total) by mouth 2 (two) times daily. 180 tablet 1  . SYMBICORT 160-4.5 MCG/ACT inhaler TAKE 2 PUFFS FIRST THING IN MORNING AND THEN ANOTHER 2 PUFFS ABOUT 12 HOURS LATER. 10.2 Inhaler 11  . zolpidem (AMBIEN) 10 MG tablet TAKE 1 TABLET BY MOUTH AT BEDTIME AS NEEDED FOR SLEEP 90 tablet 1   No current facility-administered medications for this visit.    Allergies:  Ace inhibitors; Enalapril; and Clotrimazole-betamethasone   Social History: The patient  reports that he quit smoking about 19 years ago. His smoking use included cigarettes. He started smoking about 46 years ago. He has a 54.00 pack-year smoking history. His smokeless tobacco use includes chew. He reports that he drinks alcohol. He reports that he does not use drugs.   ROS:  Please  see the history of present illness. Otherwise, complete review of systems is positive for leg weakness and fatigue.  All other systems are reviewed and negative.   Physical Exam: VS:  BP 130/80   Pulse 70   Ht 6\' 1"  (1.854 m)   Wt 227 lb (103 kg)   SpO2 98%   BMI 29.95 kg/m , BMI Body mass index is 29.95 kg/m.  Wt Readings from Last 3 Encounters:  10/06/17 227 lb (103 kg)  09/22/17 227 lb (103 kg)  07/28/17 221 lb (100.2 kg)    General: Patient appears comfortable at rest. HEENT: Conjunctiva and lids normal, oropharynx clear. Neck: Supple, no elevated JVP or carotid bruits, no thyromegaly. Lungs:  Clear to auscultation, nonlabored breathing at rest. Cardiac: Regular rate and rhythm, no S3 or significant systolic murmur, no pericardial rub. Abdomen: Soft, nontender, bowel sounds present, no guarding or rebound. Extremities: No pitting edema, distal pulses 2+. Skin: Warm and dry. Musculoskeletal: No kyphosis. Neuropsychiatric: Alert and oriented x3, affect grossly appropriate.  ECG: I personally reviewed the tracing from 12/31/2016 which showed sinus rhythm with nonspecific ST-T changes.  Recent Labwork: 11/14/2016: Hemoglobin 14.1; Platelets 145 06/02/2017: ALT 22; AST 34; BUN 19; Creatinine, Ser 1.24; Potassium 5.0; Sodium 141     Component Value Date/Time   CHOL 269 (H) 06/02/2017 0853   TRIG 337 (H) 06/02/2017 0853   HDL 39 (L) 06/02/2017 0853   CHOLHDL 6.9 (H) 06/02/2017 0853   CHOLHDL 5.6 08/25/2014 0922   VLDL 35 08/25/2014 0922   LDLCALC 163 (H) 06/02/2017 0853    Other Studies Reviewed Today:  TEE 11/21/2016: Study Conclusions  - Left ventricle: The cavity size was normal. Wall thickness was   normal. Systolic function was normal. The estimated ejection   fraction was in the range of 55% to 60%. Wall motion was normal;   there were no regional wall motion abnormalities. - Left atrium: No evidence of thrombus in the atrial cavity or   appendage. - Right atrium: No evidence of thrombus in the atrial cavity or   appendage.  Lexiscan Myoview 07/24/2014: IMPRESSION:  1. No reversible ischemia or infarction.  2. Normal left ventricular wall motion, pattern consistent with bundle branch block.  3. Left ventricular ejection fraction 51%  4. Low-risk stress test findings*.  Assessment and Plan:  1.  Multivessel CAD status post CABG in 2005 with low risk follow-up Myoview in 2015.  He does not report any active angina symptoms and will plan to continue medical therapy including aspirin and statin.  2.  History of typical atrial flutter status post  radiofrequency ablation by Dr. Lovena Le.  He is doing very well at this time and has been taken off of Eliquis.  3.  Probable familial hyperlipidemia.  We will plan to switch from Vytorin to Crestor and if he tolerates this recheck LFTs and FLP 8 weeks after instituting Crestor.  4.  Essential hypertension, blood pressure control is adequate today.  No changes made to present regimen.  Current medicines were reviewed with the patient today.  Disposition: Follow-up in 6 months.  Signed, Satira Sark, MD, [facility] 10/06/2017 9:21 AM    [facility]. [address] [place], [place] [address] Phone: [phone]; Fax: [phone]

## 2017-10-06 ENCOUNTER — Ambulatory Visit: Payer: BLUE CROSS/BLUE SHIELD | Admitting: Cardiology

## 2017-10-06 ENCOUNTER — Encounter: Payer: Self-pay | Admitting: Cardiology

## 2017-10-06 VITALS — BP 130/80 | HR 70 | Ht 73.0 in | Wt 227.0 lb

## 2017-10-06 DIAGNOSIS — I10 Essential (primary) hypertension: Secondary | ICD-10-CM | POA: Diagnosis not present

## 2017-10-06 DIAGNOSIS — E782 Mixed hyperlipidemia: Secondary | ICD-10-CM | POA: Diagnosis not present

## 2017-10-06 DIAGNOSIS — I2510 Atherosclerotic heart disease of native coronary artery without angina pectoris: Secondary | ICD-10-CM | POA: Diagnosis not present

## 2017-10-06 DIAGNOSIS — I483 Typical atrial flutter: Secondary | ICD-10-CM | POA: Diagnosis not present

## 2017-10-06 NOTE — Patient Instructions (Signed)
Your physician wants you to follow-up in: 6 months with Dr.McDowell You will receive a reminder letter in the mail two months in advance. If you don't receive a letter, please call our office to schedule the follow-up appointment.    After finishing Vytorin, please call our office and tell us.We will then start you on Crestor 5 mg at dinner for cholesterol.And after 8 weeks we will check your lipid profile-lab work     All other medications stay the same.     No tests or lab work ordered today.      Thank you for choosing [facility] !

## 2017-10-22 ENCOUNTER — Telehealth (HOSPITAL_COMMUNITY): Payer: Self-pay | Admitting: *Deleted

## 2017-10-22 NOTE — Telephone Encounter (Signed)
returned phone call to schedule appointment, left voice message

## 2017-11-04 NOTE — Progress Notes (Signed)
Psychiatric Initial Adult Assessment   Patient Identification: Dennis [name] MRN:  [id number] Date of Evaluation:  11/10/2017 Referral Source: Dr. Baltazar Apo Chief Complaint:   Chief Complaint    Depression; Psychiatric Evaluation    "I'm not gonna heal" Visit Diagnosis:    ICD-10-CM   1. PTSD (post-traumatic stress disorder) F43.10   2. Current moderate episode of major depressive disorder without prior episode (HCC) F32.1     History of Present Illness:   Dennis [name] is a 66 year old male with history of depression, anxiety, r/o PTSD, Atrial flutter s/p ablation, Multivessel CAD status post CABG in 2005, COPD, hypertension who is referred for anxiety, PTSD.  He states that he has been "mentally scared." He states that he experiences "obstacles one after another." He talks about an episode of his wife who was admitted for sixth spinal surgery in Oct 2017. On one day during admission, she did not appear well and he did not leave the hospital as his other family did. He then noticed her lip turned purple and became unresponsive. There was a time he needed to do CPR himself as there was no staff in the room. He was terrified that he thought he might lose her. He felt relieve the most in his life when he heard her vomiting in the room to know that she is alive while waiting outside of the room for her recovery. He remembers this episode whenever he sees her, although he also feels relieved and blessed that he can talk with her. He struggled with significant anxiety as she needed to undergo two more surgery since then. He believes that his symptoms has been getting worse lately as she expects more surgery for back pain for the rest of her life. He will hold her hands at night to make sure she is alive. He has had nightmares every day, waking up with diaphoresis. He feels irritable and he cannot stand with noises of his grandchildren. He feels exhausted and has anhedonia. He was told by his  family member that he changed in [place]. He feels hopeless, stating that "I'm not gonna heal." He himself also underwent heart surgery in 2005 and ablation in December. He has been unemployed since 2016 due to COPD. He states that his wife has been taking care of her father in 13's, mother with dementia, who live next to their house. He reports escalated alcohol use for insomnia. He currently drinks 18 pack per week, while he used to drink two to three beers per day. 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 He denies craving. He denies drug use. He takes Xanax a few times per week for anxiety with good effect. He takes Ambien with limited effect for insomnia.   Other psych ROS as below   Per PMP,  Zolpidem 10 mg, 90 tabs for 90 days, filled on 10/12/2017 Xanax 0.5 mg 30 tabs for 15 days, filled on 09/29/2017   Wt Readings from Last 3 Encounters:  11/10/17 223 lb (101.2 kg)  10/06/17 227 lb (103 kg)  09/22/17 227 lb (103 kg)    Associated Signs/Symptoms: Depression Symptoms:  depressed mood, anhedonia, insomnia, fatigue, anxiety, panic attacks, loss of energy/fatigue, (Hypo) Manic Symptoms:  denies decreased need for sleep or euphoria Anxiety Symptoms:  Excessive Worry, Panic Symptoms, Psychotic Symptoms:  enies AH, VH, paranoia PTSD Symptoms: Had a traumatic exposure:  witnessed his wife had CPA Re-experiencing:  Flashbacks Intrusive Thoughts  Nightmares Hypervigilance:  Yes Hyperarousal:  Difficulty Concentrating Emotional Numbness/Detachment Sleep Avoidance:  Decreased Interest/Participation  Past Psychiatric History:  Outpatient: denies Psychiatry admission: denies Previous suicide attempt: denies Past trials of medication: Ambien, Xanax History of violence:   Previous Psychotropic Medications: Yes   Substance Abuse History in the last 12 months:  Yes.    Consequences of Substance Abuse: Negative  Past Medical  History:  Past Medical History:  Diagnosis Date  . Atrial flutter ([place])    Diagnosed by ECG August 2015  . Colitis   . COPD (chronic obstructive pulmonary disease) ([place])   . Coronary atherosclerosis of native coronary artery    Multivessel status post CABG  . Essential hypertension, benign   . Hyperlipidemia   . Insomnia     Past Surgical History:  Procedure Laterality Date  . COLONOSCOPY WITH PROPOFOL N/A 08/30/2015   Procedure: COLONOSCOPY WITH PROPOFOL at cecum at 0814; withdrawal time=8 minutes;  Surgeon: Daneil Dolin, MD;  Location: [facility];  Service: Endoscopy;  Laterality: N/A;  . CORONARY ARTERY BYPASS GRAFT  2005   Dr. Lucianne Lei Trigt: LIMA to LAD, right radial to circumflex, SVG to RCA  . CORONARY ARTERY BYPASS GRAFT  10/16/2004  . ELECTROPHYSIOLOGIC STUDY N/A 11/21/2016   Procedure: A-Flutter Ablation;  Surgeon: Evans Lance, MD;  Location: [facility];  Service: Cardiovascular;  Laterality: N/A;  . TEE WITHOUT CARDIOVERSION N/A 11/21/2016   Procedure: TRANSESOPHAGEAL ECHOCARDIOGRAM (TEE);  Surgeon: Sanda Klein, MD;  Location: [facility];  Service: Cardiovascular;  Laterality: N/A;    Family Psychiatric History:  Father, mother- alcohol use "everybody in the house"  Family History:  Family History  Problem Relation Age of Onset  . Hypertension Mother   . Alcohol abuse Mother   . Alcohol abuse Father   . Colon cancer Neg Hx     Social History:   Social History   Socioeconomic History  . Marital status: Married    Spouse name: None  . Number of children: None  . Years of education: None  . Highest education level: None  Social Needs  . Financial resource strain: None  . Food insecurity - worry: None  . Food insecurity - inability: None  . Transportation needs - medical: None  . Transportation needs - non-medical: None  Occupational History  . None  Tobacco Use  . Smoking status: Former Smoker    Packs/day: 2.00    Years: 27.00    Pack years:  54.00    Types: Cigarettes    Start date: 02/01/1971    Last attempt to quit: 01/31/1998    Years since quitting: 19.7  . Smokeless tobacco: Current User    Types: Chew  Substance and Sexual Activity  . Alcohol use: Yes    Alcohol/week: 0.0 oz    Comment: Drinks beer daily, typically 2-8 a day. Also 4 oz red wine an evening.  . Drug use: No  . Sexual activity: None  Other Topics Concern  . None  Social History Narrative  . None    Additional Social History:  Lives with his wife of 42 years, has nine grandchildren Education: graduated from high school Work: unemployed since 2016 due to COPD He reports alcohol use in his "all of my family." He reports is father left wen he was 36 years old.  Allergies:   Allergies  Allergen Reactions  . Ace Inhibitors Shortness Of Breath and Cough  . Enalapril Cough    Pt has been switched  to Valsartan and is tolerating the different class.  . Clotrimazole-Betamethasone Rash    Metabolic Disorder Labs: Lab Results  Component Value Date   HGBA1C 5.3 10/10/2015   No results found for: PROLACTIN Lab Results  Component Value Date   CHOL 269 (H) 06/02/2017   TRIG 337 (H) 06/02/2017   HDL 39 (L) 06/02/2017   CHOLHDL 6.9 (H) 06/02/2017   VLDL 35 08/25/2014   LDLCALC 163 (H) 06/02/2017   LDLCALC 191 (H) 12/15/2016     Current Medications: Current Outpatient Medications  Medication Sig Dispense Refill  . albuterol (PROVENTIL HFA;VENTOLIN HFA) 108 (90 Base) MCG/ACT inhaler Inhale 2 puffs into the lungs every 4 (four) hours as needed for wheezing. 2 Inhaler 12  . ALPRAZolam (XANAX) 0.5 MG tablet Take one up to bid prn anxiety 30 tablet 3  . aspirin EC 81 MG tablet Take 1 tablet (81 mg total) by mouth daily. 30 tablet 11  . ezetimibe-simvastatin (VYTORIN) 10-40 MG tablet Take 1 tablet by mouth daily. 90 tablet 1  . fenofibrate 160 MG tablet Take 1 tablet (160 mg total) by mouth daily. 90 tablet 1  . fish oil-omega-3 fatty acids 1000 MG  capsule Take 2 g by mouth daily.    Marland Kitchen losartan-hydrochlorothiazide (HYZAAR) 100-25 MG tablet Take 1 tablet by mouth daily. 30 tablet 5  . metoprolol tartrate (LOPRESSOR) 50 MG tablet Take 1 tablet (50 mg total) by mouth 2 (two) times daily. 180 tablet 1  . SYMBICORT 160-4.5 MCG/ACT inhaler TAKE 2 PUFFS FIRST THING IN MORNING AND THEN ANOTHER 2 PUFFS ABOUT 12 HOURS LATER. 10.2 Inhaler 11  . zolpidem (AMBIEN) 10 MG tablet TAKE 1 TABLET BY MOUTH AT BEDTIME AS NEEDED FOR SLEEP 90 tablet 1  . sertraline (ZOLOFT) 50 MG tablet Start 25 mg daily for one week, then 50 mg daily 30 tablet 0   No current facility-administered medications for this visit.     Neurologic: Headache: No Seizure: No Paresthesias:No  Musculoskeletal: Strength & Muscle Tone: within normal limits Gait & Station: normal Patient leans: N/A  Psychiatric Specialty Exam: Review of Systems  Psychiatric/Behavioral: Positive for depression. Negative for hallucinations, substance abuse and suicidal ideas. The patient is nervous/anxious and has insomnia.   All other systems reviewed and are negative.   Blood pressure 132/84, pulse 73, height 6\' 1"  (1.854 m), weight 223 lb (101.2 kg), SpO2 93 %.Body mass index is 29.42 kg/m.  General Appearance: Fairly Groomed  Eye Contact:  Good  Speech:  Clear and Coherent  Volume:  Normal  Mood:  Depressed  Affect:  Appropriate, Congruent, Restricted and Tearful  Thought Process:  Coherent and Goal Directed  Orientation:  Full (Time, Place, and Person)  Thought Content:  Logical Perceptions: denies AH/VH  Suicidal Thoughts:  No  Homicidal Thoughts:  No  Memory:  Immediate;   Good Recent;   Good Remote;   Good  Judgement:  Good  Insight:  Fair  Psychomotor Activity:  Normal  Concentration:  Concentration: Good and Attention Span: Good  Recall:  Good  Fund of Knowledge:Good  Language: Good  Akathisia:  No  Handed:  Right  AIMS (if indicated):  N/A  Assets:  Communication  Skills Desire for Improvement  ADL's:  Intact  Cognition: WNL  Sleep:  poor   Assessment Ranard NICOLAOS MITRANO is a 66 year old male with history of depression, anxiety, r/o PTSD, Atrial flutter s/p ablation, Multivessel CAD status post CABG in 2005, COPD, hypertension who is referred for anxiety,  PTSD.  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  Will start sertraline to target PTSD and depression. He is on Xanax, prescribed by his PCP; discussed risk of dependence given his family history and alcohol use. He is not amenable to taper off this medication. Also discussed risk of Ambien; not amenable to change to Trazodone. Will continue to discuss as needed. Validated his experience. Discussed negative appraisal of trauma.  He will greatly benefit from supportive therapy/CBT; will make a referral.   # r/o alcohol use disorder  Patient reports family history of alcohol use disorder and escalating use for insomnia. He is motivated for sobriety. Will continue to monitor and provide motivational interview.   Plan 1. Start sertraline 25 mg daily for one week, then 50 mg daily  2. Return to clinic in one month for 30 mins  3. Referral to therapy - He is on Xanax 0.5 mg BID prn for anxiety, Ambien 10 mg qhs for insomnia, prescribed by PCP - consider checking TSH at the next encounter  The patient demonstrates the following risk factors for suicide: Chronic risk factors for suicide include: psychiatric disorder of PTSD, depression and substance use disorder. Acute risk factors for suicide include: unemployment. Protective factors for this patient include: positive social support, responsibility to others (children, family), coping skills and hope for the future. Considering these factors, the overall suicide risk at this point appears to be  low. Patient is appropriate for outpatient follow up.   Treatment Plan Summary: Plan as above   Norman Clay, MD 12/18/201811:11 AM

## 2017-11-10 ENCOUNTER — Ambulatory Visit (HOSPITAL_COMMUNITY): Payer: BLUE CROSS/BLUE SHIELD | Admitting: Psychiatry

## 2017-11-10 ENCOUNTER — Encounter (HOSPITAL_COMMUNITY): Payer: Self-pay | Admitting: Psychiatry

## 2017-11-10 VITALS — BP 132/84 | HR 73 | Ht 73.0 in | Wt 223.0 lb

## 2017-11-10 DIAGNOSIS — F410 Panic disorder [episodic paroxysmal anxiety] without agoraphobia: Secondary | ICD-10-CM

## 2017-11-10 DIAGNOSIS — G4700 Insomnia, unspecified: Secondary | ICD-10-CM | POA: Diagnosis not present

## 2017-11-10 DIAGNOSIS — F321 Major depressive disorder, single episode, moderate: Secondary | ICD-10-CM

## 2017-11-10 DIAGNOSIS — F4310 Post-traumatic stress disorder, unspecified: Secondary | ICD-10-CM

## 2017-11-10 DIAGNOSIS — R450 Nervousness: Secondary | ICD-10-CM | POA: Diagnosis not present

## 2017-11-10 DIAGNOSIS — F419 Anxiety disorder, unspecified: Secondary | ICD-10-CM

## 2017-11-10 DIAGNOSIS — Z811 Family history of alcohol abuse and dependence: Secondary | ICD-10-CM

## 2017-11-10 DIAGNOSIS — Z87891 Personal history of nicotine dependence: Secondary | ICD-10-CM | POA: Diagnosis not present

## 2017-11-10 MED ORDER — SERTRALINE HCL 50 MG PO TABS: ORAL_TABLET | ORAL | 0 refills | Status: DC

## 2017-11-10 NOTE — Patient Instructions (Signed)
1. Start sertraline 25 mg daily for one week, then 50 mg daily  2. Return to clinic in one month for 30 mins  3. Referral to therapy

## 2017-11-11 ENCOUNTER — Telehealth: Payer: Self-pay | Admitting: Cardiology

## 2017-11-11 DIAGNOSIS — Z79899 Other long term (current) drug therapy: Secondary | ICD-10-CM

## 2017-11-11 MED ORDER — ROSUVASTATIN CALCIUM 5 MG PO TABS: 5.0000 mg | ORAL_TABLET | Freq: Every day | ORAL | 3 refills | Status: DC

## 2017-11-11 NOTE — Telephone Encounter (Signed)
Please give pt a call concerning new cholesterol medication

## 2017-11-11 NOTE — Telephone Encounter (Signed)
Returned pt call. He stated that Dr. Domenic Polite wanted to change his cholesterol medication once he ran out of his other medication. Per office note on 10/06/2017 pt is to start crestor 5 mg daily and repeat labs in 8 weeks. Sent in RX for crestor 5 mg to [facility], mailed lab slip. Pt voiced understanding of plan.

## 2017-12-08 NOTE — Progress Notes (Signed)
BH MD/PA/NP OP Progress Note  12/11/2017 9:46 AM Dennis [name]  MRN:  [id number]  Chief Complaint:  Chief Complaint    Depression; Follow-up; Trauma     HPI:  Patient presents for follow-up appointment for PTSD and depression.  He states that he continues sertraline 25 mg due to worsening nausea.  He still complains of nausea after starting the medication.  He states that he still thinks very often about what happened to his wife last year.  He still holds her hands at night as he is concerned about her.  He states that he has shared his feelings with some of his family member; he felt better afterwards.  He states that he used to deal with things by putting things back in his head so that he does not need to think about it.  He does not believe any medication can "erase" his memory.  He states that he lost the ability to enjoy life anymore.  He also states that he tends to be busy taking care of his wife's parents, although he does not mind doing it.  Also he and his wife go out together sometimes, they eat meals separately.  He endorses insomnia.  He feels fatigue and has anhedonia.  He has fair concentration.  He has passive SI.  He denies AH, VH.  He feels anxious and tense.  He denies panic attacks.  He cut back his alcohol use; drinks 2-4 beers per week for the last few weeks.  He denies craving for alcohol.  Although he has cut down his Xanax use, he notices he has more headache with some migraine features of photophobia.   Per PMP,  Ambien filled on 10/12/2017 Xanax 0.5 mg filled on 12/04/2017 for 15 days, 30 tabs  Wt Readings from Last 3 Encounters:  12/11/17 226 lb (102.5 kg)  11/10/17 223 lb (101.2 kg)  10/06/17 227 lb (103 kg)    Visit Diagnosis:    ICD-10-CM   1. PTSD (post-traumatic stress disorder) F43.10   2. Current moderate episode of major depressive disorder without prior episode ([place]) F32.1     Past Psychiatric History:  I have reviewed and updated plans as  below Outpatient: denies Psychiatry admission: denies Previous suicide attempt: denies Past trials of medication: Ambien, Xanax History of violence:  Had a traumatic exposure:  witnessed his wife had CPA  Past Medical History:  Past Medical History:  Diagnosis Date  . Atrial flutter ([place])    Diagnosed by ECG August 2015  . Colitis   . COPD (chronic obstructive pulmonary disease) ([place])   . Coronary atherosclerosis of native coronary artery    Multivessel status post CABG  . Essential hypertension, benign   . Hyperlipidemia   . Insomnia     Past Surgical History:  Procedure Laterality Date  . COLONOSCOPY WITH PROPOFOL N/A 08/30/2015   Procedure: COLONOSCOPY WITH PROPOFOL at cecum at 0814; withdrawal time=8 minutes;  Surgeon: Daneil Dolin, MD;  Location: [facility];  Service: Endoscopy;  Laterality: N/A;  . CORONARY ARTERY BYPASS GRAFT  2005   Dr. Lucianne Lei Trigt: LIMA to LAD, right radial to circumflex, SVG to RCA  . CORONARY ARTERY BYPASS GRAFT  10/16/2004  . ELECTROPHYSIOLOGIC STUDY N/A 11/21/2016   Procedure: A-Flutter Ablation;  Surgeon: Evans Lance, MD;  Location: [facility];  Service: Cardiovascular;  Laterality: N/A;  . TEE WITHOUT CARDIOVERSION N/A 11/21/2016   Procedure: TRANSESOPHAGEAL ECHOCARDIOGRAM (TEE);  Surgeon: Sanda Klein, MD;  Location: [facility]  ENDOSCOPY;  Service: Cardiovascular;  Laterality: N/A;    Family Psychiatric History:  I have reviewed the patient's family history in detail and updated the patient record. Father, mother- alcohol use "everybody in the house"   Family History:  Family History  Problem Relation Age of Onset  . Hypertension Mother   . Alcohol abuse Mother   . Alcohol abuse Father   . Colon cancer Neg Hx     Social History:  Social History   Socioeconomic History  . Marital status: Married    Spouse name: Not on file  . Number of children: Not on file  . Years of education: Not on file  . Highest education level: Not on  file  Social Needs  . Financial resource strain: Not on file  . Food insecurity - worry: Not on file  . Food insecurity - inability: Not on file  . Transportation needs - medical: Not on file  . Transportation needs - non-medical: Not on file  Occupational History  . Not on file  Tobacco Use  . Smoking status: Former Smoker    Packs/day: 2.00    Years: 27.00    Pack years: 54.00    Types: Cigarettes    Start date: 02/01/1971    Last attempt to quit: 01/31/1998    Years since quitting: 19.8  . Smokeless tobacco: Current User    Types: Chew  Substance and Sexual Activity  . Alcohol use: Yes    Alcohol/week: 0.0 oz    Comment: Drinks beer daily, typically 2-8 a day. Also 4 oz red wine an evening.  . Drug use: No  . Sexual activity: Not on file  Other Topics Concern  . Not on file  Social History Narrative  . Not on file    Allergies:  Allergies  Allergen Reactions  . Ace Inhibitors Shortness Of Breath and Cough  . Enalapril Cough    Pt has been switched to Valsartan and is tolerating the different class.  . Clotrimazole-Betamethasone Rash    Metabolic Disorder Labs: Lab Results  Component Value Date   HGBA1C 5.3 10/10/2015   No results found for: PROLACTIN Lab Results  Component Value Date   CHOL 269 (H) 06/02/2017   TRIG 337 (H) 06/02/2017   HDL 39 (L) 06/02/2017   CHOLHDL 6.9 (H) 06/02/2017   VLDL 35 08/25/2014   LDLCALC 163 (H) 06/02/2017   LDLCALC 191 (H) 12/15/2016   Lab Results  Component Value Date   TSH 1.83 08/13/2015    Therapeutic Level Labs: No results found for: LITHIUM No results found for: VALPROATE No components found for:  CBMZ  Current Medications: Current Outpatient Medications  Medication Sig Dispense Refill  . albuterol (PROVENTIL HFA;VENTOLIN HFA) 108 (90 Base) MCG/ACT inhaler Inhale 2 puffs into the lungs every 4 (four) hours as needed for wheezing. 2 Inhaler 12  . ALPRAZolam (XANAX) 0.5 MG tablet Take one up to bid prn anxiety  30 tablet 3  . aspirin EC 81 MG tablet Take 1 tablet (81 mg total) by mouth daily. 30 tablet 11  . fenofibrate 160 MG tablet Take 1 tablet (160 mg total) by mouth daily. 90 tablet 1  . fish oil-omega-3 fatty acids 1000 MG capsule Take 2 g by mouth daily.    Marland Kitchen losartan-hydrochlorothiazide (HYZAAR) 100-25 MG tablet Take 1 tablet by mouth daily. 30 tablet 5  . metoprolol tartrate (LOPRESSOR) 50 MG tablet Take 1 tablet (50 mg total) by mouth 2 (two) times daily. 180 tablet  1  . rosuvastatin (CRESTOR) 5 MG tablet Take 1 tablet (5 mg total) by mouth daily. 90 tablet 3  . SYMBICORT 160-4.5 MCG/ACT inhaler TAKE 2 PUFFS FIRST THING IN MORNING AND THEN ANOTHER 2 PUFFS ABOUT 12 HOURS LATER. 10.2 Inhaler 11  . zolpidem (AMBIEN) 10 MG tablet TAKE 1 TABLET BY MOUTH AT BEDTIME AS NEEDED FOR SLEEP 90 tablet 1  . FLUoxetine (PROZAC) 10 MG tablet Take 10 mg daily for one week, then 20 mg daily 60 tablet 0   No current facility-administered medications for this visit.      Musculoskeletal: Strength & Muscle Tone: within normal limits Gait & Station: normal Patient leans: N/A  Psychiatric Specialty Exam: Review of Systems  Neurological: Positive for headaches.  Psychiatric/Behavioral: Positive for depression and suicidal ideas. Negative for hallucinations, memory loss and substance abuse. The patient is nervous/anxious and has insomnia.   All other systems reviewed and are negative.   Blood pressure 133/76, pulse 66, height 6\' 1"  (1.854 m), weight 226 lb (102.5 kg), SpO2 94 %.Body mass index is 29.82 kg/m.  General Appearance: Fairly Groomed  Eye Contact:  Good  Speech:  Clear and Coherent  Volume:  Normal  Mood:  Depressed  Affect:  Appropriate, Congruent, Restricted and down  Thought Process:  Coherent and Goal Directed  Orientation:  Full (Time, Place, and Person)  Thought Content: Logical   Suicidal Thoughts:  Yes.  without intent/plan  Homicidal Thoughts:  No  Memory:  Immediate;    Good Recent;   Good Remote;   Good  Judgement:  Good  Insight:  Fair  Psychomotor Activity:  Normal  Concentration:  Concentration: Good and Attention Span: Good  Recall:  Good  Fund of Knowledge: Good  Language: Good  Akathisia:  No  Handed:  Right  AIMS (if indicated): not done  Assets:  Communication Skills Desire for Improvement  ADL's:  Intact  Cognition: WNL  Sleep:  Poor   Screenings: GAD-7     Office Visit from 09/22/2017 in [facility]  Total GAD-7 Score  20    PHQ2-9     Office Visit from 07/09/2017 in [facility] from 12/19/2015 in [place]  PHQ-2 Total Score  0  1  PHQ-9 Total Score  5  2       Assessment and Plan:  Dennis [name] is a 67 y.o. year old male with a history of depression, anxiety, r/o PTSD, Atrial flutter s/p ablation, Multivessel CAD status post CABG in 2005, COPD, hypertension, who presents for follow up appointment for PTSD (post-traumatic stress disorder)  Current moderate episode of major depressive disorder without prior episode ([place])  # PTSD # MDD, moderate, single episode without psychotic features Patient  continues to endorse PTSD and neurovegetative symptoms in the setting of witnessing his wife experienced CPA, his old medical illness and unemployment.  He could not tolerate sertraline due to nausea.  Will switch to fluoxetine to target PTSD and depression.  He is on Xanax, prescribed by his PCP; he has been tapering it down its use.  Discussed risk of oversedation and dependence.  Although he was advised to try alternative option such as Trazodone instead of Ambien, he is not amenable to this. He is not interested in prazosin eigther at this time. Will continue to discuss as needed.  Validated his experience.  Explored his value of connection with his wife.  Explored value congruent behavior.  He agrees that  he may find some time together for  themselves.  He will greatly benefit from supportive therapy and CBT; we will make a referral.   # r/o alcohol use disorder Patient reports a family history of alcohol use disorder.  He has cut down his alcohol use.  Will continue motivational interview.   Plan 1. Discontinue sertraline  2. Start fluoxetine 10 mg daily for one week, then 20 mg daily  3. Referral to therapy 4. Return to clinic in one month for 30 mins 5. Check TSH (thyroid with your primary care doctor) - He is on Xanax 0.5 mg BID prn for anxiety, Ambien 10 mg qhs for insomnia, prescribed by PCP  The patient demonstrates the following risk factors for suicide: Chronic risk factors for suicide include: psychiatric disorder of PTSD, depression and substance use disorder. Acute risk factors for suicide include: unemployment. Protective factors for this patient include: positive social support, responsibility to others (children, family), coping skills and hope for the future. Considering these factors, the overall suicide risk at this point appears to be low. Patient is appropriate for outpatient follow up.  The duration of this appointment visit was 30 minutes of face-to-face time with the patient.  Greater than 50% of this time was spent in counseling, explanation of  diagnosis, planning of further management, and coordination of care.  Norman Clay, MD 12/11/2017, 9:46 AM

## 2017-12-11 ENCOUNTER — Ambulatory Visit (HOSPITAL_COMMUNITY): Payer: BLUE CROSS/BLUE SHIELD | Admitting: Psychiatry

## 2017-12-11 VITALS — BP 133/76 | HR 66 | Ht 73.0 in | Wt 226.0 lb

## 2017-12-11 DIAGNOSIS — R51 Headache: Secondary | ICD-10-CM

## 2017-12-11 DIAGNOSIS — F321 Major depressive disorder, single episode, moderate: Secondary | ICD-10-CM

## 2017-12-11 DIAGNOSIS — R110 Nausea: Secondary | ICD-10-CM

## 2017-12-11 DIAGNOSIS — G4700 Insomnia, unspecified: Secondary | ICD-10-CM

## 2017-12-11 DIAGNOSIS — F4310 Post-traumatic stress disorder, unspecified: Secondary | ICD-10-CM

## 2017-12-11 DIAGNOSIS — F1010 Alcohol abuse, uncomplicated: Secondary | ICD-10-CM | POA: Diagnosis not present

## 2017-12-11 DIAGNOSIS — R45851 Suicidal ideations: Secondary | ICD-10-CM | POA: Diagnosis not present

## 2017-12-11 DIAGNOSIS — F419 Anxiety disorder, unspecified: Secondary | ICD-10-CM

## 2017-12-11 DIAGNOSIS — Z87891 Personal history of nicotine dependence: Secondary | ICD-10-CM | POA: Diagnosis not present

## 2017-12-11 DIAGNOSIS — R450 Nervousness: Secondary | ICD-10-CM | POA: Diagnosis not present

## 2017-12-11 DIAGNOSIS — Z6379 Other stressful life events affecting family and household: Secondary | ICD-10-CM

## 2017-12-11 DIAGNOSIS — Z811 Family history of alcohol abuse and dependence: Secondary | ICD-10-CM | POA: Diagnosis not present

## 2017-12-11 MED ORDER — FLUOXETINE HCL 10 MG PO TABS: ORAL_TABLET | ORAL | 0 refills | Status: DC

## 2017-12-11 NOTE — Patient Instructions (Addendum)
1. Discontinue sertraline  2. Start fluoxetine 10 mg daily for one week, then 20 mg daily  3. Referral to therapy 4. Return to clinic in one month for 30 mins 5. Check TSH (thryoid with your primary care doctor)

## 2017-12-29 ENCOUNTER — Ambulatory Visit: Payer: Self-pay | Admitting: Internal Medicine

## 2017-12-30 ENCOUNTER — Other Ambulatory Visit: Payer: Self-pay | Admitting: Internal Medicine

## 2018-01-04 ENCOUNTER — Other Ambulatory Visit: Payer: Self-pay | Admitting: Family Medicine

## 2018-01-06 ENCOUNTER — Ambulatory Visit (INDEPENDENT_AMBULATORY_CARE_PROVIDER_SITE_OTHER): Payer: BLUE CROSS/BLUE SHIELD | Admitting: Orthopedic Surgery

## 2018-01-08 ENCOUNTER — Other Ambulatory Visit: Payer: Self-pay | Admitting: Family Medicine

## 2018-01-08 ENCOUNTER — Ambulatory Visit: Payer: BLUE CROSS/BLUE SHIELD | Admitting: Family Medicine

## 2018-01-11 LAB — LIPID PANEL
CHOLESTEROL: 385 mg/dL — AB (ref <200)
HDL: 41 mg/dL (ref >40)
Non-HDL Cholesterol (Calc): 344 mg/dL (calc) — ABNORMAL HIGH (ref <130)
Total CHOL/HDL Ratio: 9.4 (calc) — ABNORMAL HIGH (ref <5.0)
Triglycerides: 524 mg/dL — ABNORMAL HIGH (ref <150)

## 2018-01-11 LAB — HEPATIC FUNCTION PANEL
AG Ratio: 1.8 (calc) (ref 1.0–2.5)
ALBUMIN MSPROF: 4.5 g/dL (ref 3.6–5.1)
ALKALINE PHOSPHATASE (APISO): 28 U/L — AB (ref 40–115)
ALT: 15 U/L (ref 9–46)
AST: 23 U/L (ref 10–35)
BILIRUBIN DIRECT: 0.1 mg/dL (ref 0.0–0.2)
BILIRUBIN INDIRECT: 0.5 mg/dL (ref 0.2–1.2)
Globulin: 2.5 g/dL (calc) (ref 1.9–3.7)
Total Bilirubin: 0.6 mg/dL (ref 0.2–1.2)
Total Protein: 7 g/dL (ref 6.1–8.1)

## 2018-01-11 NOTE — Telephone Encounter (Signed)
6 mo ok 

## 2018-01-11 NOTE — Addendum Note (Signed)
Addended by: Levonne Hubert on: 01/11/2018 08:32 AM   Modules accepted: Orders

## 2018-01-13 ENCOUNTER — Ambulatory Visit (INDEPENDENT_AMBULATORY_CARE_PROVIDER_SITE_OTHER): Payer: BLUE CROSS/BLUE SHIELD

## 2018-01-13 ENCOUNTER — Ambulatory Visit (INDEPENDENT_AMBULATORY_CARE_PROVIDER_SITE_OTHER): Payer: BLUE CROSS/BLUE SHIELD | Admitting: Orthopedic Surgery

## 2018-01-13 ENCOUNTER — Encounter (INDEPENDENT_AMBULATORY_CARE_PROVIDER_SITE_OTHER): Payer: Self-pay | Admitting: Orthopedic Surgery

## 2018-01-13 DIAGNOSIS — M19011 Primary osteoarthritis, right shoulder: Secondary | ICD-10-CM

## 2018-01-13 DIAGNOSIS — M25511 Pain in right shoulder: Secondary | ICD-10-CM | POA: Diagnosis not present

## 2018-01-13 DIAGNOSIS — G8929 Other chronic pain: Secondary | ICD-10-CM | POA: Diagnosis not present

## 2018-01-13 MED ORDER — BUPIVACAINE HCL 0.25 % IJ SOLN
0.6600 mL | INTRAMUSCULAR | Status: AC | PRN
  Administered 2018-01-13: .66 mL via INTRA_ARTICULAR

## 2018-01-13 MED ORDER — LIDOCAINE HCL 1 % IJ SOLN
3.0000 mL | INTRAMUSCULAR | Status: AC | PRN
  Administered 2018-01-13: 3 mL

## 2018-01-13 MED ORDER — METHYLPREDNISOLONE ACETATE 40 MG/ML IJ SUSP
13.3300 mg | INTRAMUSCULAR | Status: AC | PRN
  Administered 2018-01-13: 13.33 mg via INTRA_ARTICULAR

## 2018-01-13 NOTE — Progress Notes (Signed)
Office Visit Note   Patient: Dennis [name]           Date of Birth: [birth year]           MRN: [id number] Visit Date: 01/13/2018 Requested by: Mikey Kirschner, [place],  [id number] PCP: Mikey Kirschner, MD  Subjective: Chief Complaint  Patient presents with  . Right Shoulder - Pain    HPI: Dennis [name] is a patient with right shoulder pain.  Has had pain for a long time.  Is been worse over the last 9 months and particularly worse over the last several weeks.  Patient states that he is waking up from sleep at night with pain.  Localizes the pain anterior and superior in the right shoulder.  Reports some range of motion.  Reports occasional burning in the arm but denies neck pain.  He does sleep on the right-hand side.  Does describe new popping.  Naprosyn did help.              ROS: All systems reviewed are negative as they relate to the chief complaint within the history of present illness.  Patient denies  fevers or chills.   Assessment & Plan: Visit Diagnoses:  1. Arthritis of right acromioclavicular joint   2. Chronic right shoulder pain     Plan: Impression is possible AC joint arthritis which is symptomatic.  Rotator cuff strength looks good and there is no evidence of frozen shoulder.  Plan is ultrasound-guided AC joint injection today.  Can see him back in 6-8 weeks for further imaging if necessary.  Alternatively if this helps him significantly and pain recurs then we can go with repeat injection before any further intervention.  Follow-Up Instructions: Return if symptoms worsen or fail to improve.   Orders:  Orders Placed This Encounter  Procedures  . XR Shoulder Right   No orders of the defined types were placed in this encounter.     Procedures: Medium Joint Inj on 01/13/2018 9:45 PM Indications: diagnostic evaluation and pain Details: 25 G 1.5 in needle, ultrasound-guided superior approach Medications: 3 mL lidocaine 1 %; 0.66 mL  bupivacaine 0.25 %; 13.33 mg methylPREDNISolone acetate 40 MG/ML Outcome: tolerated well, no immediate complications Procedure, treatment alternatives, risks and benefits explained, specific risks discussed. Consent was given by the patient. Immediately prior to procedure a time out was called to verify the correct patient, procedure, equipment, support staff and site/side marked as required. Patient was prepped and draped in the usual sterile fashion.       Clinical Data: No additional findings.  Objective: Vital Signs: There were no vitals taken for this visit.  Physical Exam:   Constitutional: Patient appears well-developed HEENT:  Head: Normocephalic Eyes:EOM are normal Neck: Normal range of motion Cardiovascular: Normal rate Pulmonary/chest: Effort normal Neurologic: Patient is alert Skin: Skin is warm Psychiatric: Patient has normal mood and affect    Ortho Exam: Orthopedic exam demonstrates good cervical spine range of motion.  There is tenderness over the [facility] joint on the right compared to the left.  Shoulder range of motion otherwise intact.  No rotator cuff weakness and no restriction of external rotation at 15 degrees of abduction.  Impingement signs are equivocal on the right negative on the left.  O'Brien's testing negative on the right negative on the left  Specialty Comments:  No specialty comments available.  Imaging: Xr Shoulder Right  Result Date: 01/13/2018 AP outlet axillary right shoulder reviewed.  Visualized lung fields clear.  There is no fracture or dislocation present.  Minimal acromial spurring.  There is an inferior clavicular spur at the distal end.  Mild AC joint arthritis noted    PMFS History: Patient Active Problem List   Diagnosis Date Noted  . PTSD (post-traumatic stress disorder) 11/10/2017  . Current moderate episode of major depressive disorder without prior episode ([place]) 11/10/2017  . Atrial flutter ([place]) 11/21/2016  . COPD GOLD III  with reversibility  10/01/2015  . Hematochezia   . Diverticulosis of colon without hemorrhage   . Hemorrhoid   . History of adenomatous polyp of colon 08/14/2015  . Rectal bleeding 08/14/2015  . Upper airway cough syndrome 08/13/2015  . Dyspnea 08/13/2015  . Impaired fasting glucose 02/26/2015  . Chronic left hip pain 09/17/2014  . Typical atrial flutter ([place]) 07/17/2014  . Hyperlipidemia LDL goal <100 04/08/2010  . Essential hypertension, benign 04/08/2010  . Coronary atherosclerosis of native coronary artery 04/08/2010  . COPD 04/08/2010   Past Medical History:  Diagnosis Date  . Atrial flutter ([place])    Diagnosed by ECG August 2015  . Colitis   . COPD (chronic obstructive pulmonary disease) ([place])   . Coronary atherosclerosis of native coronary artery    Multivessel status post CABG  . Essential hypertension, benign   . Hyperlipidemia   . Insomnia     Family History  Problem Relation Age of Onset  . Hypertension Mother   . Alcohol abuse Mother   . Alcohol abuse Father   . Colon cancer Neg Hx     Past Surgical History:  Procedure Laterality Date  . COLONOSCOPY WITH PROPOFOL N/A 08/30/2015   Procedure: COLONOSCOPY WITH PROPOFOL at cecum at 0814; withdrawal time=8 minutes;  Surgeon: Daneil Dolin, MD;  Location: [facility];  Service: Endoscopy;  Laterality: N/A;  . CORONARY ARTERY BYPASS GRAFT  2005   Dr. Lucianne Lei Trigt: LIMA to LAD, right radial to circumflex, SVG to RCA  . CORONARY ARTERY BYPASS GRAFT  10/16/2004  . ELECTROPHYSIOLOGIC STUDY N/A 11/21/2016   Procedure: A-Flutter Ablation;  Surgeon: Evans Lance, MD;  Location: [facility];  Service: Cardiovascular;  Laterality: N/A;  . TEE WITHOUT CARDIOVERSION N/A 11/21/2016   Procedure: TRANSESOPHAGEAL ECHOCARDIOGRAM (TEE);  Surgeon: Sanda Klein, MD;  Location: [facility] ENDOSCOPY;  Service: Cardiovascular;  Laterality: N/A;   Social History   Occupational History  . Not on file  Tobacco Use  . Smoking status: Former  Smoker    Packs/day: 2.00    Years: 27.00    Pack years: 54.00    Types: Cigarettes    Start date: 02/01/1971    Last attempt to quit: 01/31/1998    Years since quitting: 19.9  . Smokeless tobacco: Current User    Types: Chew  Substance and Sexual Activity  . Alcohol use: Yes    Alcohol/week: 0.0 oz    Comment: Drinks beer daily, typically 2-8 a day. Also 4 oz red wine an evening.  . Drug use: No  . Sexual activity: Not on file

## 2018-01-15 ENCOUNTER — Other Ambulatory Visit (HOSPITAL_COMMUNITY): Payer: Self-pay | Admitting: Psychiatry

## 2018-01-15 MED ORDER — FLUOXETINE HCL 20 MG PO TABS: 20.0000 mg | ORAL_TABLET | Freq: Every day | ORAL | 0 refills | Status: DC

## 2018-01-20 ENCOUNTER — Other Ambulatory Visit: Payer: Self-pay | Admitting: Internal Medicine

## 2018-01-21 ENCOUNTER — Telehealth (INDEPENDENT_AMBULATORY_CARE_PROVIDER_SITE_OTHER): Payer: Self-pay | Admitting: Orthopedic Surgery

## 2018-01-21 DIAGNOSIS — G8929 Other chronic pain: Secondary | ICD-10-CM

## 2018-01-21 DIAGNOSIS — M25511 Pain in right shoulder: Principal | ICD-10-CM

## 2018-01-21 NOTE — Telephone Encounter (Signed)
Please advise. Last note states Can see him back in 6-8 weeks for further imaging if necessary.  Alternatively if this helps him significantly and pain recurs then we can go with repeat injection before any further intervention.

## 2018-01-21 NOTE — Telephone Encounter (Signed)
Patient's wife called stating that the injection the patient received did not work and would like to proceed with the MRI.  She also stated that their insurance will cover 100% if they use the [employer] on [address] in [place].  They would like to get this done as soon as possible as the pain is now interrupting his sleep and the ability to get dressed.  CB#[id number] or [phone], you can leave a vm on this number.  Thank you.

## 2018-01-21 NOTE — Telephone Encounter (Signed)
Pls proceed with mri arthrogram of shoulder thxc

## 2018-01-22 NOTE — Telephone Encounter (Signed)
Order for scan entered. I advised done and would be called once auth'd by insurance.

## 2018-01-26 ENCOUNTER — Encounter: Payer: Self-pay | Admitting: Family Medicine

## 2018-01-26 ENCOUNTER — Ambulatory Visit: Payer: Medicare Other | Admitting: Family Medicine

## 2018-01-26 VITALS — BP 134/84 | Ht 73.0 in | Wt 226.4 lb

## 2018-01-26 DIAGNOSIS — Z125 Encounter for screening for malignant neoplasm of prostate: Secondary | ICD-10-CM | POA: Diagnosis not present

## 2018-01-26 DIAGNOSIS — I10 Essential (primary) hypertension: Secondary | ICD-10-CM

## 2018-01-26 DIAGNOSIS — E785 Hyperlipidemia, unspecified: Secondary | ICD-10-CM | POA: Diagnosis not present

## 2018-01-26 DIAGNOSIS — F5101 Primary insomnia: Secondary | ICD-10-CM

## 2018-01-26 MED ORDER — ALBUTEROL SULFATE HFA 108 (90 BASE) MCG/ACT IN AERS: 2.0000 | INHALATION_SPRAY | RESPIRATORY_TRACT | 12 refills | Status: DC | PRN

## 2018-01-26 MED ORDER — EZETIMIBE-SIMVASTATIN 10-40 MG PO TABS: 1.0000 | ORAL_TABLET | Freq: Every day | ORAL | 5 refills | Status: DC

## 2018-01-26 MED ORDER — ZOLPIDEM TARTRATE 10 MG PO TABS: 10.0000 mg | ORAL_TABLET | Freq: Every evening | ORAL | 1 refills | Status: DC | PRN

## 2018-01-26 NOTE — Progress Notes (Signed)
   Subjective:    Patient ID: Dennis [name], male    DOB: 1950-12-26, 67 y.o.   MRN: [id number]  HPI Pt here today for follow up on hypertension, blood work, and would like to speak with provider about prostate. Would like to discuss medications with providers such as Losartan.  Results for orders placed or performed in visit on 10/06/17  Hepatic function panel  Result Value Ref Range   Total Protein 7.0 6.1 - 8.1 g/dL   Albumin 4.5 3.6 - 5.1 g/dL   Globulin 2.5 1.9 - 3.7 g/dL (calc)   AG Ratio 1.8 1.0 - 2.5 (calc)   Total Bilirubin 0.6 0.2 - 1.2 mg/dL   Bilirubin, Direct 0.1 0.0 - 0.2 mg/dL   Indirect Bilirubin 0.5 0.2 - 1.2 mg/dL (calc)   Alkaline phosphatase (APISO) 28 (L) 40 - 115 U/L   AST 23 10 - 35 U/L   ALT 15 9 - 46 U/L  Lipid panel  Result Value Ref Range   Cholesterol 385 (H) <200 mg/dL   HDL 41 >40 mg/dL   Triglycerides 524 (H) <150 mg/dL   LDL Cholesterol (Calc)  mg/dL (calc)   Total CHOL/HDL Ratio 9.4 (H) <5.0 (calc)   Non-HDL Cholesterol (Calc) 344 (H) <130 mg/dL (calc)   Blood pressure medicine and blood pressure levels reviewed today with patient. Compliant with blood pressure medicine. States does not miss a dose. No obvious side effects. Blood pressure generally good when checked elsewhere. Watching salt intake.   Patient continues to take lipid medication regularly. No obvious side effects from it. Generally does not miss a dose. Prior blood work results are reviewed with patient. Patient continues to work on fat intake in diet  Patient compliant with insomnia medication. Generally takes most nights. No obvious morning drowsiness. Definitely helps patient sleep. Without it patient states would not get a good nights rest.   Review of Systems No headache, no major weight loss or weight gain, no chest pain no back pain abdominal pain no change in bowel habits complete ROS otherwise negative     Objective:   Physical Exam  Alert and oriented, vitals  reviewed and stable, NAD ENT-TM's and ext canals WNL bilat via otoscopic exam Soft palate, tonsils and post pharynx WNL via oropharyngeal exam Neck-symmetric, no masses; thyroid nonpalpable and nontender Pulmonary-no tachypnea or accessory muscle use; Clear without wheezes via auscultation Card--no abnrml murmurs, rhythm reg and rate WNL Carotid pulses symmetric, without bruits       Assessment & Plan:  Impression 1 hyperlipidemia.  Ongoing great challenge for patient.  Could not handle Crestor would like to go back to his old combination which actually did better form with the numbers.  We will honor his request  2.  Hypertension decent control discussed to maintain same meds  3.  PTSD continues to receive therapy for this.  Medications helping to.25   4.  Coronary artery disease followed by specialist  5.  COPD.  Generally stable.  Time for screening blood work discussed chronic meds refilled diet exercise discussed  Greater than 50% of this 25 minute face to face visit was spent in counseling and discussion and coordination of care regarding the above diagnosis/diagnosies patient would like to follow-up fairly closely in several months recheck blood work done to see how things are going

## 2018-01-27 NOTE — Telephone Encounter (Signed)
Please advise how you wish to proceed 

## 2018-01-27 NOTE — Telephone Encounter (Signed)
It is not ideal but we can still get some useful information from the non-arthrogram MRI

## 2018-01-27 NOTE — Telephone Encounter (Signed)
Patient is wondering since [facility] does not do the kind of MRI ordered if he can do one without the contrast? Please advise # [phone]

## 2018-01-28 NOTE — Telephone Encounter (Signed)
IC advised per Dr Marlou Sa. They are agreeable to go to [facility].  I advised per notes in chart on referral that this had been discussed with them on 01/25/18.  I gave patients wife number for [facility] and they will call them to schedule appt.

## 2018-02-02 ENCOUNTER — Ambulatory Visit (INDEPENDENT_AMBULATORY_CARE_PROVIDER_SITE_OTHER): Payer: BLUE CROSS/BLUE SHIELD | Admitting: Licensed Clinical Social Worker

## 2018-02-02 DIAGNOSIS — F4310 Post-traumatic stress disorder, unspecified: Secondary | ICD-10-CM

## 2018-02-02 DIAGNOSIS — F321 Major depressive disorder, single episode, moderate: Secondary | ICD-10-CM

## 2018-02-03 ENCOUNTER — Ambulatory Visit (HOSPITAL_COMMUNITY)
Admission: RE | Admit: 2018-02-03 | Discharge: 2018-02-03 | Disposition: A | Discharge status: Home / Self Care | Payer: BLUE CROSS/BLUE SHIELD | Source: Ambulatory Visit | Attending: Orthopedic Surgery | Admitting: Orthopedic Surgery

## 2018-02-03 ENCOUNTER — Encounter (HOSPITAL_COMMUNITY): Payer: Self-pay | Admitting: Licensed Clinical Social Worker

## 2018-02-03 DIAGNOSIS — G8929 Other chronic pain: Secondary | ICD-10-CM

## 2018-02-03 DIAGNOSIS — M75101 Unspecified rotator cuff tear or rupture of right shoulder, not specified as traumatic: Secondary | ICD-10-CM | POA: Diagnosis not present

## 2018-02-03 DIAGNOSIS — M75111 Incomplete rotator cuff tear or rupture of right shoulder, not specified as traumatic: Secondary | ICD-10-CM | POA: Diagnosis not present

## 2018-02-03 DIAGNOSIS — M25511 Pain in right shoulder: Principal | ICD-10-CM

## 2018-02-03 MED ORDER — IOPAMIDOL (ISOVUE-M 200) INJECTION 41%
INTRAMUSCULAR | Status: AC
  Filled 2018-02-03: qty 10

## 2018-02-03 MED ORDER — SODIUM CHLORIDE 0.9 % IJ SOLN
INTRAMUSCULAR | Status: AC
  Filled 2018-02-03: qty 50

## 2018-02-03 MED ORDER — GADOBENATE DIMEGLUMINE 529 MG/ML IV SOLN
5.0000 mL | Freq: Once | INTRAVENOUS | Status: AC | PRN
  Administered 2018-02-03: 0.01 mL via INTRA_ARTICULAR

## 2018-02-03 MED ORDER — IOPAMIDOL (ISOVUE-300) INJECTION 61%
INTRAVENOUS | Status: AC
  Administered 2018-02-03: 15 mL
  Filled 2018-02-03: qty 50

## 2018-02-03 MED ORDER — LIDOCAINE HCL (PF) 1 % IJ SOLN
INTRAMUSCULAR | Status: AC
  Administered 2018-02-03: 7 mL
  Filled 2018-02-03: qty 10

## 2018-02-03 MED ORDER — POVIDONE-IODINE 10 % EX SOLN
CUTANEOUS | Status: AC
  Filled 2018-02-03: qty 15
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
## 2018-02-08 ENCOUNTER — Ambulatory Visit (INDEPENDENT_AMBULATORY_CARE_PROVIDER_SITE_OTHER): Payer: BLUE CROSS/BLUE SHIELD | Admitting: Orthopedic Surgery

## 2018-02-09 ENCOUNTER — Ambulatory Visit (HOSPITAL_COMMUNITY): Payer: Self-pay | Admitting: Psychiatry

## 2018-02-10 ENCOUNTER — Encounter (INDEPENDENT_AMBULATORY_CARE_PROVIDER_SITE_OTHER): Payer: Self-pay | Admitting: Orthopedic Surgery

## 2018-02-10 ENCOUNTER — Ambulatory Visit (INDEPENDENT_AMBULATORY_CARE_PROVIDER_SITE_OTHER): Payer: BLUE CROSS/BLUE SHIELD | Admitting: Orthopedic Surgery

## 2018-02-10 DIAGNOSIS — M7501 Adhesive capsulitis of right shoulder: Secondary | ICD-10-CM

## 2018-02-12 ENCOUNTER — Telehealth (INDEPENDENT_AMBULATORY_CARE_PROVIDER_SITE_OTHER): Payer: Self-pay | Admitting: Radiology

## 2018-02-12 ENCOUNTER — Telehealth (HOSPITAL_COMMUNITY): Payer: Self-pay | Admitting: Psychiatry

## 2018-02-12 MED ORDER — FLUOXETINE HCL 20 MG PO TABS: 20.0000 mg | ORAL_TABLET | Freq: Every day | ORAL | 0 refills | Status: DC

## 2018-02-12 NOTE — Telephone Encounter (Signed)
Received refill request for fluoxetine, ordered for a month. Please contact the patient to have follow up appointment. No more refill to be ordered without evaluation.

## 2018-02-12 NOTE — Telephone Encounter (Signed)
LMOM for patient to call back to advise.

## 2018-02-12 NOTE — Telephone Encounter (Signed)
Spoke with Dennis [name] with Dr. Lorin Mercy advise. Also advise if any reactions become more severe over weekend to seek treatment at urgent care or ER. Patient can always call our office over weekend and will be routed to physician on-call.

## 2018-02-12 NOTE — Telephone Encounter (Signed)
Patients wife called asking for medication used in Right shoulder injection on 02/10/18 by Dr. Marlou Sa.  States patient started having skin reaction yesterday morning 24hrs after injection: redness and itching to tops of feet, groin redness and itching, and inner forearm redness and itching.  Patient has been taking benedryl since yesterday, has been helpful, however itching has continued but is not any worse.  Advised patient that Dr. Marlou Sa uses 1cc Depo 40mg /mL with 9cc 0.25% bupivicaine, along with 5cc lidocaine 1% in his shoulder injections.   Please advise if there is anything else patient can try for relief.  Call back: [phone]

## 2018-02-12 NOTE — Telephone Encounter (Signed)
Spoke with patient & he Thanks you for the refills. He stated that he has a lot going on right now. I asked if everything was okay ? He stated he's been having sever issues with shoulder & multiple MRI's. I encouraged him to call for a follow up as soon as he can get the previous resolved.

## 2018-02-12 NOTE — Telephone Encounter (Signed)
I spoke with Dr. Lorin Mercy.  He states that patient could use hydrocortisone cream on it as long as he has not had athletes foot before. If he has had athletes foot, do not use the hydrocortisone cream. He can continue the benadryl.

## 2018-02-14 ENCOUNTER — Encounter (INDEPENDENT_AMBULATORY_CARE_PROVIDER_SITE_OTHER): Payer: Self-pay | Admitting: Orthopedic Surgery

## 2018-02-14 DIAGNOSIS — M7501 Adhesive capsulitis of right shoulder: Secondary | ICD-10-CM | POA: Diagnosis not present

## 2018-02-14 MED ORDER — BUPIVACAINE HCL 0.5 % IJ SOLN
9.0000 mL | INTRAMUSCULAR | Status: AC | PRN
  Administered 2018-02-14: 9 mL via INTRA_ARTICULAR

## 2018-02-14 MED ORDER — METHYLPREDNISOLONE ACETATE 40 MG/ML IJ SUSP
40.0000 mg | INTRAMUSCULAR | Status: AC | PRN
  Administered 2018-02-14: 40 mg via INTRA_ARTICULAR

## 2018-02-14 MED ORDER — LIDOCAINE HCL 1 % IJ SOLN
5.0000 mL | INTRAMUSCULAR | Status: AC | PRN
  Administered 2018-02-14: 5 mL

## 2018-02-14 NOTE — Progress Notes (Signed)
Office Visit Note   Patient: Dennis [name]           Date of Birth: Jul 15, 1951           MRN: [id number] Visit Date: 02/10/2018 Requested by: Dennis [name], [address], [place] [address] PCP: Dennis Kirschner, MD  Subjective: Chief Complaint  Patient presents with  . Right Shoulder - Follow-up    HPI: Dennis [name] is a patient with right shoulder pain.  Since I have last seen him he has had an MRI scan.  Questionable signal in the superior labrum and there is a partial-thickness rotator cuff tear.  No full-thickness tear is seen.  Also changes on my evaluation of the scan consistent with early adhesive capsulitis.  Patient states it hurts him to come across his body.  Hard for him to get his hand behind his back.  He cannot lay on the right-hand side.  The pain radiates to the elbow.  He has been taking occasional pain medicine from his wife.  He did have a subacromial injection which only helped him for a few days.              ROS: All systems reviewed are negative as they relate to the chief complaint within the history of present illness.  Patient denies  fevers or chills.   Assessment & Plan: Visit Diagnoses:  1. Adhesive capsulitis of right shoulder     Plan: Impression is right shoulder pain with possible early adhesive capsulitis.  We will give him a range of motion exercise sheet and also try an intra-articular injection today.  6-week return for repeat clinical assessment and decision for or against surgical intervention at that time which would consist of possible biceps tenodesis and rotator interval release.  Follow-Up Instructions: Return in about 6 weeks (around 03/24/2018).   Orders:  No orders of the defined types were placed in this encounter.  No orders of the defined types were placed in this encounter.     Procedures: Large Joint Inj: R glenohumeral on 02/14/2018 9:25 AM Indications: diagnostic evaluation and pain Details: 18 G 1.5 in  needle, posterior approach  Arthrogram: No  Medications: 9 mL bupivacaine 0.5 %; 40 mg methylPREDNISolone acetate 40 MG/ML; 5 mL lidocaine 1 % Outcome: tolerated well, no immediate complications Procedure, treatment alternatives, risks and benefits explained, specific risks discussed. Consent was given by the patient. Immediately prior to procedure a time out was called to verify the correct patient, procedure, equipment, support staff and site/side marked as required. Patient was prepped and draped in the usual sterile fashion.       Clinical Data: No additional findings.  Objective: Vital Signs: There were no vitals taken for this visit.  Physical Exam:   Constitutional: Patient appears well-developed HEENT:  Head: Normocephalic Eyes:EOM are normal Neck: Normal range of motion Cardiovascular: Normal rate Pulmonary/chest: Effort normal Neurologic: Patient is alert Skin: Skin is warm Psychiatric: Patient has normal mood and affect    Ortho Exam: Orthopedic exam demonstrates full active and passive range of motion of that left shoulder.  On the right-hand side he is lacking about 20 degrees of full forward flexion and abduction.  Rotator cuff strength is good.  No discrete AC joint tenderness is noted.  Neck range of motion is full.  Motor sensory function in the right hand is intact.  O'Brien's testing equivocal on the right negative on the left.  No discrete AC joint tenderness right  versus left today.  Specialty Comments:  No specialty comments available.  Imaging: No results found.   PMFS History: Patient Active Problem List   Diagnosis Date Noted  . PTSD (post-traumatic stress disorder) 11/10/2017  . Current moderate episode of major depressive disorder without prior episode ([place]) 11/10/2017  . Atrial flutter ([place]) 11/21/2016  . COPD GOLD III with reversibility  10/01/2015  . Hematochezia   . Diverticulosis of colon without hemorrhage   . Hemorrhoid   . History  of adenomatous polyp of colon 08/14/2015  . Rectal bleeding 08/14/2015  . Upper airway cough syndrome 08/13/2015  . Dyspnea 08/13/2015  . Impaired fasting glucose 02/26/2015  . Chronic left hip pain 09/17/2014  . Typical atrial flutter ([place]) 07/17/2014  . Hyperlipidemia LDL goal <100 04/08/2010  . Essential hypertension, benign 04/08/2010  . Coronary atherosclerosis of native coronary artery 04/08/2010  . COPD 04/08/2010   Past Medical History:  Diagnosis Date  . Atrial flutter ([place])    Diagnosed by ECG August 2015  . Colitis   . COPD (chronic obstructive pulmonary disease) ([place])   . Coronary atherosclerosis of native coronary artery    Multivessel status post CABG  . Essential hypertension, benign   . Hyperlipidemia   . Insomnia     Family History  Problem Relation Age of Onset  . Hypertension Mother   . Alcohol abuse Mother   . Alcohol abuse Father   . Colon cancer Neg Hx     Past Surgical History:  Procedure Laterality Date  . COLONOSCOPY WITH PROPOFOL N/A 08/30/2015   Procedure: COLONOSCOPY WITH PROPOFOL at cecum at 0814; withdrawal time=8 minutes;  Surgeon: Daneil Dolin, MD;  Location: [facility];  Service: Endoscopy;  Laterality: N/A;  . CORONARY ARTERY BYPASS GRAFT  2005   Dr. Lucianne Lei Trigt: LIMA to LAD, right radial to circumflex, SVG to RCA  . CORONARY ARTERY BYPASS GRAFT  10/16/2004  . ELECTROPHYSIOLOGIC STUDY N/A 11/21/2016   Procedure: A-Flutter Ablation;  Surgeon: Evans Lance, MD;  Location: [facility];  Service: Cardiovascular;  Laterality: N/A;  . TEE WITHOUT CARDIOVERSION N/A 11/21/2016   Procedure: TRANSESOPHAGEAL ECHOCARDIOGRAM (TEE);  Surgeon: Sanda Klein, MD;  Location: [facility];  Service: Cardiovascular;  Laterality: N/A;   Social History   Occupational History  . Not on file  Tobacco Use  . Smoking status: Former Smoker    Packs/day: 2.00    Years: 27.00    Pack years: 54.00    Types: Cigarettes    Start date: 02/01/1971    Last  attempt to quit: 01/31/1998    Years since quitting: 20.0  . Smokeless tobacco: Current User    Types: Chew  Substance and Sexual Activity  . Alcohol use: Yes    Alcohol/week: 0.0 oz    Comment: Drinks beer daily, typically 2-8 a day. Also 4 oz red wine an evening.  . Drug use: No  . Sexual activity: Not on file

## 2018-02-15 NOTE — Telephone Encounter (Signed)
fyi

## 2018-02-19 ENCOUNTER — Other Ambulatory Visit: Payer: Self-pay | Admitting: Internal Medicine

## 2018-02-19 ENCOUNTER — Telehealth: Payer: Self-pay | Admitting: Family Medicine

## 2018-02-19 NOTE — Telephone Encounter (Signed)
Pt declines appointment states he will see how he feels over the weekend and will go to the ed or urgent care if feels getting worse or call back on Monday to get an appt her if pt does not feel any better by then.

## 2018-02-19 NOTE — Telephone Encounter (Signed)
Patient declines appt he states he will see how he feels over the week

## 2018-02-19 NOTE — Telephone Encounter (Signed)
Pt called stating that he has COPD and did yard work the other day. Pt states that he got into some lime and it has aggravated his COPD. Pt is requesting an antibiotic to be called in if possible. Pt states that he does not have a fever or any other symptoms. Pt is not wanting to come in and take a chance on catching something else. Please advise.    [facility]

## 2018-02-19 NOTE — Telephone Encounter (Signed)
1.  Please let the patient know it is in his best interest that we formally evaluate him meaning that we listen to his lungs check his temperature so we can decide if just an antibiotic is needed or potentially steroids.  This is too complex to prescribe over the phone safely.  I can work him in at 2:30 PM we will try to make his weight as short as possible to lessen his risk of being exposed to other stuff.(Please let him know that we are not over run with the flu)

## 2018-02-19 NOTE — Telephone Encounter (Signed)
Please advise 

## 2018-03-05 ENCOUNTER — Ambulatory Visit (HOSPITAL_COMMUNITY): Payer: BLUE CROSS/BLUE SHIELD | Admitting: Licensed Clinical Social Worker

## 2018-03-24 ENCOUNTER — Ambulatory Visit (INDEPENDENT_AMBULATORY_CARE_PROVIDER_SITE_OTHER): Payer: BLUE CROSS/BLUE SHIELD | Admitting: Orthopedic Surgery

## 2018-04-12 ENCOUNTER — Telehealth (HOSPITAL_COMMUNITY): Payer: Self-pay | Admitting: *Deleted

## 2018-04-12 ENCOUNTER — Telehealth (HOSPITAL_COMMUNITY): Payer: Self-pay | Admitting: Psychiatry

## 2018-04-12 DIAGNOSIS — Z125 Encounter for screening for malignant neoplasm of prostate: Secondary | ICD-10-CM | POA: Diagnosis not present

## 2018-04-12 DIAGNOSIS — E785 Hyperlipidemia, unspecified: Secondary | ICD-10-CM | POA: Diagnosis not present

## 2018-04-12 NOTE — Telephone Encounter (Signed)
Received fluoxetine refill request. I have not seen him since January and we have discussed that I would not be able to order refill without evaluation. Please contact the patient to make follow up appointment.

## 2018-04-12 NOTE — Telephone Encounter (Signed)
PER PROVIDER: Received fluoxetine refill request. I have not seen him since January and we have discussed that I would not be able to order refill without evaluation. Please contact the patient to make follow up appointment.   SPOKE WITH PATIENT & HE STATED HE IS QUITE AWARE OF THAT & AT THIS TIME HE WILL NOT BE MAKING A FOLLOW UP APPOINTMENT

## 2018-04-13 LAB — BASIC METABOLIC PANEL
BUN/Creatinine Ratio: 12 (ref 10–24)
BUN: 16 mg/dL (ref 8–27)
CALCIUM: 9.7 mg/dL (ref 8.6–10.2)
CO2: 25 mmol/L (ref 20–29)
CREATININE: 1.32 mg/dL — AB (ref 0.76–1.27)
Chloride: 99 mmol/L (ref 96–106)
GFR calc Af Amer: 65 mL/min/{1.73_m2} (ref >59)
GFR, EST NON AFRICAN AMERICAN: 56 mL/min/{1.73_m2} — AB (ref >59)
GLUCOSE: 108 mg/dL — AB (ref 65–99)
Potassium: 4.8 mmol/L (ref 3.5–5.2)
Sodium: 139 mmol/L (ref 134–144)

## 2018-04-13 LAB — PSA: PROSTATE SPECIFIC AG, SERUM: 0.8 ng/mL (ref 0.0–4.0)

## 2018-04-13 LAB — HEPATIC FUNCTION PANEL
ALBUMIN: 4.1 g/dL (ref 3.6–4.8)
ALT: 19 IU/L (ref 0–44)
AST: 33 IU/L (ref 0–40)
Alkaline Phosphatase: 25 IU/L — ABNORMAL LOW (ref 39–117)
Bilirubin Total: 0.4 mg/dL (ref 0.0–1.2)
Bilirubin, Direct: 0.15 mg/dL (ref 0.00–0.40)
TOTAL PROTEIN: 6.5 g/dL (ref 6.0–8.5)

## 2018-04-13 LAB — LIPID PANEL
CHOL/HDL RATIO: 6.4 ratio — AB (ref 0.0–5.0)
Cholesterol, Total: 268 mg/dL — ABNORMAL HIGH (ref 100–199)
HDL: 42 mg/dL (ref >39)
LDL CALC: 177 mg/dL — AB (ref 0–99)
Triglycerides: 244 mg/dL — ABNORMAL HIGH (ref 0–149)
VLDL CHOLESTEROL CAL: 49 mg/dL — AB (ref 5–40)

## 2018-04-29 ENCOUNTER — Ambulatory Visit (INDEPENDENT_AMBULATORY_CARE_PROVIDER_SITE_OTHER): Payer: BLUE CROSS/BLUE SHIELD | Admitting: Family Medicine

## 2018-04-29 ENCOUNTER — Encounter: Payer: Self-pay | Admitting: Family Medicine

## 2018-04-29 VITALS — BP 132/80 | Ht 73.0 in | Wt 227.6 lb

## 2018-04-29 DIAGNOSIS — I10 Essential (primary) hypertension: Secondary | ICD-10-CM | POA: Diagnosis not present

## 2018-04-29 DIAGNOSIS — F5101 Primary insomnia: Secondary | ICD-10-CM

## 2018-04-29 DIAGNOSIS — E785 Hyperlipidemia, unspecified: Secondary | ICD-10-CM

## 2018-04-29 DIAGNOSIS — J449 Chronic obstructive pulmonary disease, unspecified: Secondary | ICD-10-CM | POA: Diagnosis not present

## 2018-04-29 MED ORDER — LOSARTAN POTASSIUM-HCTZ 100-25 MG PO TABS: 1.0000 | ORAL_TABLET | Freq: Every day | ORAL | 1 refills | Status: DC

## 2018-04-29 MED ORDER — ALPRAZOLAM 0.5 MG PO TABS: ORAL_TABLET | ORAL | 2 refills | Status: DC

## 2018-04-29 MED ORDER — ALBUTEROL SULFATE HFA 108 (90 BASE) MCG/ACT IN AERS: 2.0000 | INHALATION_SPRAY | RESPIRATORY_TRACT | 5 refills | Status: DC | PRN

## 2018-04-29 MED ORDER — EZETIMIBE-SIMVASTATIN 10-40 MG PO TABS: 1.0000 | ORAL_TABLET | Freq: Every day | ORAL | 1 refills | Status: DC

## 2018-04-29 MED ORDER — ALBUTEROL SULFATE (2.5 MG/3ML) 0.083% IN NEBU: 2.5000 mg | INHALATION_SOLUTION | Freq: Four times a day (QID) | RESPIRATORY_TRACT | 5 refills | Status: DC | PRN

## 2018-04-29 MED ORDER — FENOFIBRATE 160 MG PO TABS: 160.0000 mg | ORAL_TABLET | Freq: Every day | ORAL | 1 refills | Status: DC

## 2018-04-29 MED ORDER — BUDESONIDE-FORMOTEROL FUMARATE 160-4.5 MCG/ACT IN AERO: INHALATION_SPRAY | RESPIRATORY_TRACT | 5 refills | Status: DC

## 2018-04-29 MED ORDER — METOPROLOL TARTRATE 50 MG PO TABS: 50.0000 mg | ORAL_TABLET | Freq: Two times a day (BID) | ORAL | 1 refills | Status: DC

## 2018-04-29 NOTE — Patient Instructions (Signed)
Results for orders placed or performed in visit on 01/26/18  Lipid Profile  Result Value Ref Range   Cholesterol, Total 268 (H) 100 - 199 mg/dL   Triglycerides 244 (H) 0 - 149 mg/dL   HDL 42 >39 mg/dL   VLDL Cholesterol Cal 49 (H) 5 - 40 mg/dL   LDL Calculated 177 (H) 0 - 99 mg/dL   Chol/HDL Ratio 6.4 (H) 0.0 - 5.0 ratio  Hepatic function panel  Result Value Ref Range   Total Protein 6.5 6.0 - 8.5 g/dL   Albumin 4.1 3.6 - 4.8 g/dL   Bilirubin Total 0.4 0.0 - 1.2 mg/dL   Bilirubin, Direct 0.15 0.00 - 0.40 mg/dL   Alkaline Phosphatase 25 (L) 39 - 117 IU/L   AST 33 0 - 40 IU/L   ALT 19 0 - 44 IU/L  Basic Metabolic Panel (BMET)  Result Value Ref Range   Glucose 108 (H) 65 - 99 mg/dL   BUN 16 8 - 27 mg/dL   Creatinine, Ser 1.32 (H) 0.76 - 1.27 mg/dL   GFR calc non Af Amer 56 (L) >59 mL/min/1.73   GFR calc Af Amer 65 >59 mL/min/1.73   BUN/Creatinine Ratio 12 10 - 24   Sodium 139 134 - 144 mmol/L   Potassium 4.8 3.5 - 5.2 mmol/L   Chloride 99 96 - 106 mmol/L   CO2 25 20 - 29 mmol/L   Calcium 9.7 8.6 - 10.2 mg/dL  PSA  Result Value Ref Range   Prostate Specific Ag, Serum 0.8 0.0 - 4.0 ng/mL    Total cholesterol in February was 385!

## 2018-04-29 NOTE — Progress Notes (Signed)
Subjective:    Patient ID: Dennis [name], male    DOB: 23-Aug-1951, 67 y.o.   MRN: [id number]  Hyperlipidemia  This is a chronic problem. Compliance problems include adherence to exercise (does not exercise due to lung issues and feeling tired all the time).    Would like a 90 day supply of xanax.  Still dealing with challenges of PTSD.  Sees a psychiatrist for this.  Received Xanax to [place] for insomnia.  States he handles it well.  Needs refill on symbicort and albuterol inhaler and neb solution. Breathing not the bes. Housebound with the humidity and th heat.  Known COPD.  No longer smoking  Patient continues to take lipid medication regularly. No obvious side effects from it. Generally does not miss a dose. Prior blood work results are reviewed with patient. Patient continues to work on fat intake in diet  Working o hard on [employer], and trying to cut down fats in the diet  Eating an ocawionl burger    Would like to get handicapp parking permit.   Results for orders placed or performed in visit on 01/26/18  Lipid Profile  Result Value Ref Range   Cholesterol, Total 268 (H) 100 - 199 mg/dL   Triglycerides 244 (H) 0 - 149 mg/dL   HDL 42 >39 mg/dL   VLDL Cholesterol Cal 49 (H) 5 - 40 mg/dL   LDL Calculated 177 (H) 0 - 99 mg/dL   Chol/HDL Ratio 6.4 (H) 0.0 - 5.0 ratio  Hepatic function panel  Result Value Ref Range   Total Protein 6.5 6.0 - 8.5 g/dL   Albumin 4.1 3.6 - 4.8 g/dL   Bilirubin Total 0.4 0.0 - 1.2 mg/dL   Bilirubin, Direct 0.15 0.00 - 0.40 mg/dL   Alkaline Phosphatase 25 (L) 39 - 117 IU/L   AST 33 0 - 40 IU/L   ALT 19 0 - 44 IU/L  Basic Metabolic Panel (BMET)  Result Value Ref Range   Glucose 108 (H) 65 - 99 mg/dL   BUN 16 8 - 27 mg/dL   Creatinine, Ser 1.32 (H) 0.76 - 1.27 mg/dL   GFR calc non Af Amer 56 (L) >59 mL/min/1.73   GFR calc Af Amer 65 >59 mL/min/1.73   BUN/Creatinine Ratio 12 10 - 24   Sodium 139 134 - 144 mmol/L   Potassium 4.8 3.5 - 5.2 mmol/L     Chloride 99 96 - 106 mmol/L   CO2 25 20 - 29 mmol/L   Calcium 9.7 8.6 - 10.2 mg/dL  PSA  Result Value Ref Range   Prostate Specific Ag, Serum 0.8 0.0 - 4.0 ng/mL    Review of Systems No headache, no major weight loss or weight gain, no chest pain no back pain abdominal pain no change in bowel habits complete ROS otherwise negative     Objective:   Physical Exam Alert and oriented, vitals reviewed and stable, NAD ENT-TM's and ext canals WNL bilat via otoscopic exam Soft palate, tonsils and post pharynx WNL via oropharyngeal exam Neck-symmetric, no masses; thyroid nonpalpable and nontender Pulmonary-no tachypnea or accessory muscle use; Clear without wheezes via auscultation Card--no abnrml murmurs, rhythm reg and rate WNL Carotid pulses symmetric, without bruits        Assessment & Plan:  1 impression hyperlipidemia.  Clinically improved discussed maintain same therapy.  Patient unable to tolerate other medications.  2.  COPD.  Also clinically stable on medication refill.  Exercise encouraged.  3.  Chronic anxiety  with element of insomnia.  Discussed.  Anxiolytic medication prescribed proper use discussed.  Knowing prescribed 1 month at a time with refills.  However patient takes sparingly as last well  Follow-up in 6 months for wellness exam plus chronic visit.  Diet exercise discussed.  Medication

## 2018-05-26 ENCOUNTER — Ambulatory Visit (INDEPENDENT_AMBULATORY_CARE_PROVIDER_SITE_OTHER): Payer: Medicare Other | Admitting: Family Medicine

## 2018-05-26 ENCOUNTER — Encounter: Payer: Self-pay | Admitting: Family Medicine

## 2018-05-26 VITALS — BP 110/72 | Ht 73.0 in | Wt 223.0 lb

## 2018-05-26 DIAGNOSIS — J449 Chronic obstructive pulmonary disease, unspecified: Secondary | ICD-10-CM | POA: Diagnosis not present

## 2018-05-26 DIAGNOSIS — J441 Chronic obstructive pulmonary disease with (acute) exacerbation: Secondary | ICD-10-CM

## 2018-05-26 MED ORDER — PREDNISONE 20 MG PO TABS: ORAL_TABLET | ORAL | 0 refills | Status: DC

## 2018-05-26 MED ORDER — AMOXICILLIN-POT CLAVULANATE 875-125 MG PO TABS: ORAL_TABLET | ORAL | 0 refills | Status: DC

## 2018-05-26 NOTE — Progress Notes (Signed)
   Subjective:    Patient ID: Dennis [name], male    DOB: August 16, 1951, 67 y.o.   MRN: [id number]  HPI Patient is here today with complaints of a cough for the last two days,also runny nose, a little sore throat,coughing up mucus and vomited this am from the cough. He has not taken anything for this. He states he has a hx of Copd and wanted to get under control.  Bad cough   Productive in nature    No feer   Vomiting with ba d coughng spell    Blood pressure good    Review of Systems No headache, no major weight loss or weight gain, no chest pain no back pain abdominal pain no change in bowel habits complete ROS otherwise negative   Alert Mytelase    Objective:   Physical Exam  Alert mild malaise HEENT moderate nasal congestion pharynx normal lungs bilateral wheezes no tachypnea no inspiratory crackles positive bronchial cough diminished breath sounds diffusely which is baseline      Assessment & Plan:  Impression exacerbation of COPD.  Discussed at length.  Antibiotics initiated.  Steroid taper initiated.  Warning signs discussed

## 2018-08-09 ENCOUNTER — Other Ambulatory Visit: Payer: Self-pay | Admitting: Family Medicine

## 2018-08-09 NOTE — Telephone Encounter (Signed)
Six mo worth 

## 2018-08-19 ENCOUNTER — Ambulatory Visit (INDEPENDENT_AMBULATORY_CARE_PROVIDER_SITE_OTHER): Payer: Medicare Other

## 2018-08-19 DIAGNOSIS — Z23 Encounter for immunization: Secondary | ICD-10-CM

## 2018-09-09 ENCOUNTER — Other Ambulatory Visit: Payer: Self-pay | Admitting: Family Medicine

## 2018-09-22 ENCOUNTER — Other Ambulatory Visit: Payer: Self-pay | Admitting: Family Medicine

## 2018-09-23 ENCOUNTER — Encounter: Payer: Self-pay | Admitting: Family Medicine

## 2018-09-23 ENCOUNTER — Ambulatory Visit (INDEPENDENT_AMBULATORY_CARE_PROVIDER_SITE_OTHER): Payer: Medicare Other | Admitting: Family Medicine

## 2018-09-23 VITALS — BP 122/80 | Ht 73.0 in | Wt 226.0 lb

## 2018-09-23 DIAGNOSIS — E785 Hyperlipidemia, unspecified: Secondary | ICD-10-CM

## 2018-09-23 DIAGNOSIS — J449 Chronic obstructive pulmonary disease, unspecified: Secondary | ICD-10-CM | POA: Diagnosis not present

## 2018-09-23 DIAGNOSIS — Z79899 Other long term (current) drug therapy: Secondary | ICD-10-CM | POA: Diagnosis not present

## 2018-09-23 DIAGNOSIS — I10 Essential (primary) hypertension: Secondary | ICD-10-CM

## 2018-09-23 MED ORDER — HYDROCODONE-HOMATROPINE 5-1.5 MG/5ML PO SYRP: ORAL_SOLUTION | ORAL | 0 refills | Status: DC

## 2018-09-23 MED ORDER — AMOXICILLIN-POT CLAVULANATE 875-125 MG PO TABS: ORAL_TABLET | ORAL | 0 refills | Status: DC

## 2018-09-23 MED ORDER — PREDNISONE 20 MG PO TABS: ORAL_TABLET | ORAL | 0 refills | Status: DC

## 2018-09-23 NOTE — Progress Notes (Signed)
   Subjective:    Patient ID: Dennis [name], male    DOB: May 14, 1951, 68 y.o.   MRN: [id number]  HPI Patient is here today with complaints of cough and mucus,runny nose,sore throat, unable to sleep. He has a hx of Copd and wanted to get checked before it progresses. This started Sunday.   Last night the cough ane cong was really bad   posprod  Cough    Now yellow gunkiness   Frontal h a    Wheezing worsenign   He has been taking cough syrup. Wants his cholesterol check today.   Review of Systems No headache, no major weight loss or weight gain, no chest pain no back pain abdominal pain no change in bowel habits complete ROS otherwise negative     Objective:   Physical Exam  Alert, mild malaise. Hydration good Vitals stable. frontal/ maxillary tenderness evident positive nasal congestion. pharynx normal neck supple  lungs clear/no crackles moderate positive wheezes. heart regular in rhythm       Assessment & Plan:  Impression rhinosinusitis/bronchitis/exacerbation COPD likely post viral, discussed with patient. plan antibiotics prescribed. Questions answered. Symptomatic care discussed. warning signs discussed. WSL

## 2018-10-12 ENCOUNTER — Encounter: Payer: Self-pay | Admitting: Family Medicine

## 2018-10-12 ENCOUNTER — Ambulatory Visit (INDEPENDENT_AMBULATORY_CARE_PROVIDER_SITE_OTHER): Payer: Medicare Other | Admitting: Family Medicine

## 2018-10-12 VITALS — Temp 98.4°F | Ht 73.0 in | Wt 226.2 lb

## 2018-10-12 DIAGNOSIS — R319 Hematuria, unspecified: Secondary | ICD-10-CM

## 2018-10-12 DIAGNOSIS — Z79899 Other long term (current) drug therapy: Secondary | ICD-10-CM | POA: Diagnosis not present

## 2018-10-12 DIAGNOSIS — R300 Dysuria: Secondary | ICD-10-CM | POA: Diagnosis not present

## 2018-10-12 DIAGNOSIS — Z125 Encounter for screening for malignant neoplasm of prostate: Secondary | ICD-10-CM

## 2018-10-12 DIAGNOSIS — I10 Essential (primary) hypertension: Secondary | ICD-10-CM | POA: Diagnosis not present

## 2018-10-12 DIAGNOSIS — E785 Hyperlipidemia, unspecified: Secondary | ICD-10-CM | POA: Diagnosis not present

## 2018-10-12 LAB — POCT URINALYSIS DIPSTICK
SPEC GRAV UA: 1.01 (ref 1.010–1.025)
pH, UA: 7 (ref 5.0–8.0)

## 2018-10-12 NOTE — Progress Notes (Signed)
   Subjective:    Patient ID: Dennis [name], male    DOB: 12-31-1950, 67 y.o.   MRN: [id number]  Flank Pain  This is a new problem. The current episode started 1 to 4 weeks ago. Radiates to: right side of abdomen. Exacerbated by: moving/walking. (Increasing urination. Pt states he has went about 5 times in 3 hours. Strong odor present at times)  pt states that he has seen small specs of blood in urine. Has not seen any more blood in urine at this time. Pt states he does see small specs of something in the bottom of toilet but unable to tell what they are.   Pt felt achiness in right flank aftr putting on a lawn mower implement  Pt uringating last week and saw four smal spots of blood   Pt has monitored urnie for blood, sees occas spec he cant identify   No dysuria   No x of kid stones   Pain overall imoroived Feels pain on that side when moves about  incr urine and odor  Took not tc meds for discomfort       Results for orders placed or performed in visit on 10/12/18  POCT Urinalysis Dipstick  Result Value Ref Range   Color, UA     Clarity, UA     Glucose, UA     Bilirubin, UA     Ketones, UA     Spec Grav, UA 1.010 1.010 - 1.025   Blood, UA     pH, UA 7.0 5.0 - 8.0   Protein, UA     Urobilinogen, UA     Nitrite, UA     Leukocytes, UA     Appearance     Odor      Review of Systems  Genitourinary: Positive for flank pain.  No headache, no major weight loss or weight gain, no chest pain no back pain abdominal pain no change in bowel habits complete ROS otherwise negative      Objective:   Physical Exam  Alert and oriented, vitals reviewed and stable, NAD ENT-TM's and ext canals WNL bilat via otoscopic exam Soft palate, tonsils and post pharynx WNL via oropharyngeal exam Neck-symmetric, no masses; thyroid nonpalpable and nontender Pulmonary-no tachypnea or accessory muscle use; Clear without wheezes via auscultation Card--no abnrml murmurs, rhythm reg  and rate WNL Carotid pulses symmetric, without bruits Plus minus tenderness right lateral flank Urinalysis occasional red blood cell high-power field     Assessment & Plan:  Impression right flank pain with episode of gross hematuria.  Discussed.  Now very minimal microscopic hematuria.  Patient understandably anxious about all of this.  With flank pain plus gross hematuria plus age plus smoking history plus industrial exposures feel neurology work-up warranted.  Ultrasound today.  Some of patient's pain may be musculoskeletal.  This is discussed symptom care discussed  Greater than 50% of this 25 minute face to face visit was spent in counseling and discussion and coordination of care regarding the above diagnosis/diagnosies

## 2018-10-12 NOTE — Patient Instructions (Signed)
The microscopic hematuria that I am seeing today, along with the gross hematuria tat you experienced, along with the flank pain, 99 % family doc would say that deserves a urology work up because of the small chance that something serious may be gog on

## 2018-10-13 ENCOUNTER — Telehealth: Payer: Self-pay | Admitting: *Deleted

## 2018-10-13 LAB — HEPATIC FUNCTION PANEL
ALBUMIN: 4.7 g/dL (ref 3.6–4.8)
ALK PHOS: 28 IU/L — AB (ref 39–117)
ALT: 21 IU/L (ref 0–44)
AST: 30 IU/L (ref 0–40)
Bilirubin Total: 0.5 mg/dL (ref 0.0–1.2)
Bilirubin, Direct: 0.18 mg/dL (ref 0.00–0.40)
Total Protein: 7.1 g/dL (ref 6.0–8.5)

## 2018-10-13 LAB — BASIC METABOLIC PANEL
BUN/Creatinine Ratio: 13 (ref 10–24)
BUN: 19 mg/dL (ref 8–27)
CALCIUM: 10.1 mg/dL (ref 8.6–10.2)
CO2: 23 mmol/L (ref 20–29)
Chloride: 99 mmol/L (ref 96–106)
Creatinine, Ser: 1.41 mg/dL — ABNORMAL HIGH (ref 0.76–1.27)
GFR calc Af Amer: 60 mL/min/{1.73_m2} (ref >59)
GFR calc non Af Amer: 52 mL/min/{1.73_m2} — ABNORMAL LOW (ref >59)
Glucose: 110 mg/dL — ABNORMAL HIGH (ref 65–99)
POTASSIUM: 4.5 mmol/L (ref 3.5–5.2)
Sodium: 141 mmol/L (ref 134–144)

## 2018-10-13 LAB — LIPID PANEL
CHOLESTEROL TOTAL: 276 mg/dL — AB (ref 100–199)
Chol/HDL Ratio: 6.6 ratio — ABNORMAL HIGH (ref 0.0–5.0)
HDL: 42 mg/dL (ref >39)
LDL CALC: 175 mg/dL — AB (ref 0–99)
TRIGLYCERIDES: 294 mg/dL — AB (ref 0–149)
VLDL Cholesterol Cal: 59 mg/dL — ABNORMAL HIGH (ref 5–40)

## 2018-10-13 NOTE — Telephone Encounter (Signed)
Patient is aware 

## 2018-10-13 NOTE — Telephone Encounter (Signed)
Pt called and wanted to know if the renal ultrasound ordered yesterday was to look for kidney stone or something else. Wants to know what was found in his urine yesterday. Pt states on his note from yesterday it had screening for prostate cancer but he states his prostate was not checked. Pt states he is sorry he did not ask these questions yesterday.   Renal US is scheduled for tomorrow.    And would like results of bloodwork done yesterday.     Call back on [phone].

## 2018-10-13 NOTE — Telephone Encounter (Signed)
bw was done for discussion in dec but since pt asking  All tests relatively the same as before,  Urine showed a few red blood cells, u s looking for stones or tumors, I dont know why prost ca screening was inserted sonmetimes the nurses insert this when ordering blood work Administrator, [employer]

## 2018-10-14 ENCOUNTER — Ambulatory Visit (HOSPITAL_COMMUNITY)
Admission: RE | Admit: 2018-10-14 | Discharge: 2018-10-14 | Disposition: A | Discharge status: Home / Self Care | Payer: Medicare Other | Source: Ambulatory Visit | Attending: Family Medicine | Admitting: Family Medicine

## 2018-10-14 DIAGNOSIS — R319 Hematuria, unspecified: Secondary | ICD-10-CM | POA: Insufficient documentation

## 2018-10-18 ENCOUNTER — Encounter: Payer: Self-pay | Admitting: Family Medicine

## 2018-10-19 ENCOUNTER — Ambulatory Visit (HOSPITAL_COMMUNITY): Payer: BLUE CROSS/BLUE SHIELD

## 2018-10-29 ENCOUNTER — Encounter: Payer: BLUE CROSS/BLUE SHIELD | Admitting: Family Medicine

## 2018-11-03 ENCOUNTER — Telehealth: Payer: Self-pay | Admitting: Family Medicine

## 2018-11-03 ENCOUNTER — Encounter: Payer: Self-pay | Admitting: Family Medicine

## 2018-11-03 ENCOUNTER — Ambulatory Visit (INDEPENDENT_AMBULATORY_CARE_PROVIDER_SITE_OTHER): Payer: Medicare Other | Admitting: Family Medicine

## 2018-11-03 VITALS — BP 132/90 | Ht 73.0 in | Wt 221.2 lb

## 2018-11-03 DIAGNOSIS — I10 Essential (primary) hypertension: Secondary | ICD-10-CM | POA: Diagnosis not present

## 2018-11-03 DIAGNOSIS — Z0000 Encounter for general adult medical examination without abnormal findings: Secondary | ICD-10-CM | POA: Diagnosis not present

## 2018-11-03 DIAGNOSIS — R319 Hematuria, unspecified: Secondary | ICD-10-CM

## 2018-11-03 DIAGNOSIS — F4310 Post-traumatic stress disorder, unspecified: Secondary | ICD-10-CM

## 2018-11-03 DIAGNOSIS — E7800 Pure hypercholesterolemia, unspecified: Secondary | ICD-10-CM

## 2018-11-03 DIAGNOSIS — R300 Dysuria: Secondary | ICD-10-CM

## 2018-11-03 DIAGNOSIS — F5101 Primary insomnia: Secondary | ICD-10-CM | POA: Diagnosis not present

## 2018-11-03 DIAGNOSIS — E785 Hyperlipidemia, unspecified: Secondary | ICD-10-CM

## 2018-11-03 DIAGNOSIS — J449 Chronic obstructive pulmonary disease, unspecified: Secondary | ICD-10-CM

## 2018-11-03 LAB — POCT URINALYSIS DIPSTICK
Spec Grav, UA: 1.015 (ref 1.010–1.025)
pH, UA: 5 (ref 5.0–8.0)

## 2018-11-03 MED ORDER — BUDESONIDE-FORMOTEROL FUMARATE 160-4.5 MCG/ACT IN AERO: INHALATION_SPRAY | RESPIRATORY_TRACT | 1 refills | Status: DC

## 2018-11-03 MED ORDER — EZETIMIBE-SIMVASTATIN 10-40 MG PO TABS: 1.0000 | ORAL_TABLET | Freq: Every day | ORAL | 1 refills | Status: DC

## 2018-11-03 MED ORDER — ALPRAZOLAM 0.5 MG PO TABS: ORAL_TABLET | ORAL | 2 refills | Status: DC

## 2018-11-03 MED ORDER — METOPROLOL TARTRATE 50 MG PO TABS: 50.0000 mg | ORAL_TABLET | Freq: Two times a day (BID) | ORAL | 1 refills | Status: DC

## 2018-11-03 MED ORDER — LOSARTAN POTASSIUM-HCTZ 100-25 MG PO TABS: 1.0000 | ORAL_TABLET | Freq: Every day | ORAL | 1 refills | Status: DC

## 2018-11-03 MED ORDER — ALBUTEROL SULFATE HFA 108 (90 BASE) MCG/ACT IN AERS: 2.0000 | INHALATION_SPRAY | RESPIRATORY_TRACT | 5 refills | Status: DC | PRN

## 2018-11-03 MED ORDER — ALBUTEROL SULFATE (2.5 MG/3ML) 0.083% IN NEBU: 2.5000 mg | INHALATION_SOLUTION | Freq: Four times a day (QID) | RESPIRATORY_TRACT | 5 refills | Status: DC | PRN

## 2018-11-03 NOTE — Telephone Encounter (Signed)
Provider looked at urine and stated that urine looked good. Tried to contact patient; left message to return call

## 2018-11-03 NOTE — Progress Notes (Signed)
Subjective:    Patient ID: Dennis [name], male    DOB: [birth year], 67 y.o.   MRN: [id number] Patient presents for wellness plus numerous chronic and acute concerns HPI AWV- Annual Wellness Visit  The patient was seen for their annual wellness visit. The patient's past medical history, surgical history, and family history were reviewed. Pertinent vaccines were reviewed ( tetanus, pneumonia, shingles, flu) The patient's medication list was reviewed and updated.  The height and weight were entered.  BMI recorded in electronic record elsewhere  Cognitive screening was completed. Outcome of Mini - Cog:    Falls /depression screening electronically recorded within record elsewhere  Current tobacco usage: does not smoke; does use chewing tobacco (All patients who use tobacco were given written and verbal information on quitting)  Recent listing of emergency department/hospitalizations over the past year were reviewed.  current specialist the patient sees on a regular basis:    Medicare annual wellness visit patient questionnaire was reviewed.  A written screening schedule for the patient for the next 5-10 years was given. Appropriate discussion of followup regarding next visit was discussed.   Pt also wanting to know if provider would like to recheck urine. Pt was in 10/12/18 for blood in urine. Pt had renal ultrasound and blood work. [place] came back OK. Pt states he is not having any problems at this time with urination. Pt was referred to Urology but states that when he contacted office; they stated that he was not in system or marked to be see. Wife therefore contacted [facility] office and has appt set up for January 2020.   Results for orders placed or performed in visit on 10/12/18  POCT Urinalysis Dipstick  Result Value Ref Range   Color, UA     Clarity, UA     Glucose, UA     Bilirubin, UA     Ketones, UA     Spec Grav, UA 1.010 1.010 - 1.025   Blood, UA     pH, UA 7.0  5.0 - 8.0   Protein, UA     Urobilinogen, UA     Nitrite, UA     Leukocytes, UA     Appearance     Odor     Recent Results (from the past 2160 hour(s))  POCT Urinalysis Dipstick     Status: None   Collection Time: 10/12/18 10:24 AM  Result Value Ref Range   Color, UA     Clarity, UA     Glucose, UA     Bilirubin, UA     Ketones, UA     Spec Grav, UA 1.010 1.010 - 1.025   Blood, UA     pH, UA 7.0 5.0 - 8.0   Protein, UA     Urobilinogen, UA     Nitrite, UA     Leukocytes, UA     Appearance     Odor    Basic metabolic panel     Status: Abnormal   Collection Time: 10/12/18 11:08 AM  Result Value Ref Range   Glucose 110 (H) 65 - 99 mg/dL   BUN 19 8 - 27 mg/dL   Creatinine, Ser 1.41 (H) 0.76 - 1.27 mg/dL   GFR calc non Af Amer 52 (L) >59 mL/min/1.73   GFR calc Af Amer 60 >59 mL/min/1.73   BUN/Creatinine Ratio 13 10 - 24   Sodium 141 134 - 144 mmol/L   Potassium 4.5 3.5 - 5.2 mmol/L   Chloride 99  96 - 106 mmol/L   CO2 23 20 - 29 mmol/L   Calcium 10.1 8.6 - 10.2 mg/dL  Lipid panel     Status: Abnormal   Collection Time: 10/12/18 11:08 AM  Result Value Ref Range   Cholesterol, Total 276 (H) 100 - 199 mg/dL   Triglycerides 294 (H) 0 - 149 mg/dL   HDL 42 >39 mg/dL   VLDL Cholesterol Cal 59 (H) 5 - 40 mg/dL   LDL Calculated 175 (H) 0 - 99 mg/dL   Chol/HDL Ratio 6.6 (H) 0.0 - 5.0 ratio    Comment:                                   T. Chol/HDL Ratio                                             Men  Women                               1/2 Avg.Risk  3.4    3.3                                   Avg.Risk  5.0    4.4                                2X Avg.Risk  9.6    7.1                                3X Avg.Risk 23.4   11.0   Hepatic function panel     Status: Abnormal   Collection Time: 10/12/18 11:08 AM  Result Value Ref Range   Total Protein 7.1 6.0 - 8.5 g/dL   Albumin 4.7 3.6 - 4.8 g/dL   Bilirubin Total 0.5 0.0 - 1.2 mg/dL   Bilirubin, Direct 0.18 0.00 - 0.40 mg/dL     Alkaline Phosphatase 28 (L) 39 - 117 IU/L   AST 30 0 - 40 IU/L   ALT 21 0 - 44 IU/L   No more episodes of blood noted, flank pain has cleared up, urine back to regular     .hytn Blood pressure medicine and blood pressure levels reviewed today with patient. Compliant with blood pressure medicine. States does not miss a dose. No obvious side effects. Blood pressure generally good when checked elsewhere. Watching salt intake.   Patient continues to take lipid medication regularly. No obvious side effects from it. Generally does not miss a dose. Prior blood work results are reviewed with patient. Patient continues to work on fat intake in diet  Patient has stopped using Symbicort.  Saw on TV may be related to more infections.  Therefore stopped having more flares now particularly with respiratory tract infections.  Has had a recent 1.  Notes substantial improvement at this time.  Has not used albuterol for several days   Not exercising much, has had recurrent lung infxns Review of Systems  Constitutional: Negative for activity change, appetite change and fever.  HENT: Negative for congestion and  rhinorrhea.   Eyes: Negative for discharge.  Respiratory: Negative for cough and wheezing.   Cardiovascular: Negative for chest pain.  Gastrointestinal: Negative for abdominal pain, blood in stool and vomiting.  Genitourinary: Negative for difficulty urinating and frequency.  Musculoskeletal: Negative for neck pain.  Skin: Negative for rash.  Allergic/Immunologic: Negative for environmental allergies and food allergies.  Neurological: Negative for weakness and headaches.  Psychiatric/Behavioral: Negative for agitation.  All other systems reviewed and are negative.      Objective:   Physical Exam  Constitutional: He appears well-developed and well-nourished.  HENT:  Head: Normocephalic and atraumatic.  Right Ear: External ear normal.  Left Ear: External ear normal.  Nose: Nose normal.   Mouth/Throat: Oropharynx is clear and moist.  Eyes: Pupils are equal, round, and reactive to light. EOM are normal.  Neck: Normal range of motion. Neck supple. No thyromegaly present.  Cardiovascular: Normal rate, regular rhythm and normal heart sounds.  No murmur heard. Pulmonary/Chest: Effort normal and breath sounds normal. No respiratory distress. He has no wheezes.  Abdominal: Soft. Bowel sounds are normal. He exhibits no distension and no mass. There is no tenderness.  Genitourinary: Penis normal.  Musculoskeletal: Normal range of motion. He exhibits no edema.  Lymphadenopathy:    He has no cervical adenopathy.  Neurological: He is alert. He exhibits normal muscle tone.  Skin: Skin is warm and dry. No erythema.  Psychiatric: He has a normal mood and affect. His behavior is normal. Judgment normal.  Vitals reviewed.         Assessment & Plan:  Impression wellness.  Up-to-date on flu shots and pneumonia shots.  Diet discussed exercise discussed.  Up-to-date on colonoscopy.  2.  Hematuria gross in nature.  Await urology follow-up.  Urine currently fine.  3.  Recent flare of COPD.  Symbicort discussed.  There is slight increased risk of respiratory infection.  Albuterol refill.  Patient to get back with his pulmonary specialist and I agree with this  4.  PTSD.  Clinically improved.  Patient started on Prozac by psychiatrist but elected not to take it which I think is okay  5.  Hypertension good control discussed maintain same  6.  Hyperlipidemia controlled..  Ongoing challenges. Taking medicine rationale discussed maintain same meds  As noted above follow-up in 6 months diet exercise discussed medications refilled

## 2018-11-03 NOTE — Telephone Encounter (Signed)
Pt had urine sample while done in office.

## 2018-11-04 NOTE — Telephone Encounter (Signed)
Dennis [name], patient would like copy of most recent labwork results. Could you print these off and mail to patient. Pt states he asked for them yesterday at visit, but did not receive them. Thank you

## 2018-12-02 DIAGNOSIS — H9193 Unspecified hearing loss, bilateral: Secondary | ICD-10-CM

## 2018-12-02 DIAGNOSIS — H903 Sensorineural hearing loss, bilateral: Secondary | ICD-10-CM | POA: Insufficient documentation

## 2018-12-02 HISTORY — DX: Unspecified hearing loss, bilateral: H91.93

## 2018-12-28 DIAGNOSIS — Z029 Encounter for administrative examinations, unspecified: Secondary | ICD-10-CM

## 2018-12-29 ENCOUNTER — Other Ambulatory Visit: Payer: Self-pay | Admitting: Family Medicine

## 2019-01-05 ENCOUNTER — Ambulatory Visit (INDEPENDENT_AMBULATORY_CARE_PROVIDER_SITE_OTHER): Payer: Medicare Other | Admitting: Urology

## 2019-01-05 DIAGNOSIS — R310 Gross hematuria: Secondary | ICD-10-CM

## 2019-01-07 ENCOUNTER — Other Ambulatory Visit (HOSPITAL_COMMUNITY): Payer: Self-pay | Admitting: Urology

## 2019-01-07 ENCOUNTER — Other Ambulatory Visit: Payer: Self-pay | Admitting: Urology

## 2019-01-07 DIAGNOSIS — R310 Gross hematuria: Secondary | ICD-10-CM

## 2019-01-10 ENCOUNTER — Ambulatory Visit (HOSPITAL_COMMUNITY)
Admission: RE | Admit: 2019-01-10 | Discharge: 2019-01-10 | Disposition: A | Discharge status: Home / Self Care | Payer: Medicare Other | Source: Ambulatory Visit | Attending: Urology | Admitting: Urology

## 2019-01-10 DIAGNOSIS — R319 Hematuria, unspecified: Secondary | ICD-10-CM | POA: Diagnosis not present

## 2019-01-10 DIAGNOSIS — R310 Gross hematuria: Secondary | ICD-10-CM | POA: Diagnosis not present

## 2019-01-10 LAB — POCT I-STAT CREATININE: Creatinine, Ser: 1.4 mg/dL — ABNORMAL HIGH (ref 0.61–1.24)

## 2019-01-10 MED ORDER — IOPAMIDOL (ISOVUE-300) INJECTION 61%
150.0000 mL | Freq: Once | INTRAVENOUS | Status: AC | PRN
  Administered 2019-01-10: 125 mL via INTRAVENOUS

## 2019-01-30 ENCOUNTER — Other Ambulatory Visit: Payer: Self-pay | Admitting: Family Medicine

## 2019-02-01 NOTE — Telephone Encounter (Signed)
Six mo ok 

## 2019-02-02 ENCOUNTER — Other Ambulatory Visit: Payer: Self-pay

## 2019-02-02 ENCOUNTER — Ambulatory Visit (INDEPENDENT_AMBULATORY_CARE_PROVIDER_SITE_OTHER): Payer: Medicare Other | Admitting: Urology

## 2019-02-02 DIAGNOSIS — R310 Gross hematuria: Secondary | ICD-10-CM

## 2019-03-17 ENCOUNTER — Other Ambulatory Visit: Payer: Self-pay | Admitting: Family Medicine

## 2019-03-25 ENCOUNTER — Other Ambulatory Visit: Payer: Self-pay | Admitting: Family Medicine

## 2019-05-05 ENCOUNTER — Ambulatory Visit: Payer: Medicare Other | Admitting: Family Medicine

## 2019-05-09 ENCOUNTER — Telehealth: Payer: Self-pay | Admitting: Family Medicine

## 2019-05-09 NOTE — Telephone Encounter (Signed)
Last labs 10/12/18 lipid, liver, bmp

## 2019-05-09 NOTE — Telephone Encounter (Signed)
Keep appointment (virtual) but hold off on blood work this time

## 2019-05-09 NOTE — Telephone Encounter (Signed)
Discussed with pt. Pt verbalized understanding.  °

## 2019-05-09 NOTE — Telephone Encounter (Signed)
Pt has follow up on cholesterol scheduled for July 16th. He would like to know if Dr. Richardson Landry would like him to have lab work done a week before appt or if we should push this appointment out some more.

## 2019-05-19 ENCOUNTER — Other Ambulatory Visit: Payer: Self-pay | Admitting: Family Medicine

## 2019-05-21 NOTE — Telephone Encounter (Signed)
Six mo worth 

## 2019-06-09 ENCOUNTER — Other Ambulatory Visit: Payer: Self-pay

## 2019-06-09 ENCOUNTER — Ambulatory Visit (INDEPENDENT_AMBULATORY_CARE_PROVIDER_SITE_OTHER): Payer: Medicare Other | Admitting: Family Medicine

## 2019-06-09 DIAGNOSIS — E785 Hyperlipidemia, unspecified: Secondary | ICD-10-CM | POA: Diagnosis not present

## 2019-06-09 DIAGNOSIS — I10 Essential (primary) hypertension: Secondary | ICD-10-CM

## 2019-06-09 DIAGNOSIS — J449 Chronic obstructive pulmonary disease, unspecified: Secondary | ICD-10-CM | POA: Diagnosis not present

## 2019-06-09 MED ORDER — LOSARTAN POTASSIUM 100 MG PO TABS: 100.0000 mg | ORAL_TABLET | Freq: Every day | ORAL | 1 refills | Status: DC

## 2019-06-09 MED ORDER — ALPRAZOLAM 0.5 MG PO TABS: ORAL_TABLET | ORAL | 5 refills | Status: DC

## 2019-06-09 MED ORDER — HYDROCHLOROTHIAZIDE 25 MG PO TABS: 25.0000 mg | ORAL_TABLET | Freq: Every day | ORAL | 1 refills | Status: DC

## 2019-06-09 MED ORDER — BUDESONIDE-FORMOTEROL FUMARATE 160-4.5 MCG/ACT IN AERO: INHALATION_SPRAY | RESPIRATORY_TRACT | 1 refills | Status: DC

## 2019-06-09 MED ORDER — METOPROLOL TARTRATE 50 MG PO TABS: 50.0000 mg | ORAL_TABLET | Freq: Two times a day (BID) | ORAL | 1 refills | Status: DC

## 2019-06-09 MED ORDER — ALBUTEROL SULFATE HFA 108 (90 BASE) MCG/ACT IN AERS: 2.0000 | INHALATION_SPRAY | RESPIRATORY_TRACT | 5 refills | Status: DC | PRN

## 2019-06-09 MED ORDER — ALBUTEROL SULFATE (2.5 MG/3ML) 0.083% IN NEBU: 2.5000 mg | INHALATION_SOLUTION | Freq: Four times a day (QID) | RESPIRATORY_TRACT | 5 refills | Status: DC | PRN

## 2019-06-09 MED ORDER — FENOFIBRATE 160 MG PO TABS: 160.0000 mg | ORAL_TABLET | Freq: Every day | ORAL | 1 refills | Status: DC

## 2019-06-09 MED ORDER — ZOLPIDEM TARTRATE 10 MG PO TABS: 10.0000 mg | ORAL_TABLET | Freq: Every evening | ORAL | 1 refills | Status: DC | PRN

## 2019-06-09 MED ORDER — EZETIMIBE-SIMVASTATIN 10-40 MG PO TABS: 1.0000 | ORAL_TABLET | Freq: Every day | ORAL | 1 refills | Status: DC

## 2019-06-09 NOTE — Progress Notes (Signed)
   Subjective:  Audio only  Patient ID: Dennis [name], male    DOB: [birth year], 68 y.o.   MRN: [id number]  Hypertension This is a chronic problem. There are no compliance problems.   pt states his BP has been running on average about 135/70-130/80. No issues with BP. Pt states he doesn't check BP often. Pt states he can tell when he is having an issue with BP. Pt states he is currently taking HCTZ 25 mg and Losartan 100 mg; the pharmacy ran out of the Hyzaar 100-25 mg that he was taking. Pt states that one he runs out of the HCTZ and losartan pills, the pharmacy will be able to give him the Hyzaar.  Virtual Visit via Video Note  I connected with Dennis [name] on 06/09/19 at  9:00 AM EDT by a video enabled telemedicine application and verified that I am speaking with the correct person using two identifiers.  Location: Patient: home Provider: office    I discussed the limitations of evaluation and management by telemedicine and the availability of in person appointments. The patient expressed understanding and agreed to proceed.  History of Present Illness:    Observations/Objective:   Assessment and Plan:   Follow Up Instructions:    I discussed the assessment and treatment plan with the patient. The patient was provided an opportunity to ask questions and all were answered. The patient agreed with the plan and demonstrated an understanding of the instructions.   The patient was advised to call back or seek an in-person evaluation if the symptoms worsen or if the condition fails to improve as anticipated.  I provided 43minutes of non-face-to-face time during this encounter.   Vicente Males, LPN  Blood pressure medicine and blood pressure levels reviewed today with patient. Compliant with blood pressure medicine. States does not miss a dose. No obvious side effects. Blood pressure generally good when checked elsewhere. Watching salt intake.   Patient continues to take lipid  medication regularly. No obvious side effects from it. Generally does not miss a dose. Prior blood work results are reviewed with patient. Patient continues to work on fat intake in diet  None COPD.  Followed by specialist for this.  Compliant with medications.  Shortness of breath is not worsened.  Coronavirus discussed.  Patient has numerous concerns educated in this regard   Review of Systems No headache, no major weight loss or weight gain, no chest pain no back pain abdominal pain no change in bowel habits complete ROS otherwise negative     Objective:   Physical Exam  Virtual      Assessment & Plan:  Impression hypertension.  Good control discussed to maintain same meds  2.  Hyperlipidemia.  Prior blood work reviewed.  Compliance discussed diet discussed to maintain same meds  3.  Coronary artery disease with atrial flutter followed by specialist  4.  COPD chronic followed by specialist compliance discussed  5.  Coronavirus concerns discussed  Follow-up in 6 months medications refilled diet exercise discussed blood work reviewed

## 2019-06-25 ENCOUNTER — Encounter: Payer: Self-pay | Admitting: Family Medicine

## 2019-06-29 ENCOUNTER — Other Ambulatory Visit: Payer: Self-pay | Admitting: *Deleted

## 2019-06-29 MED ORDER — METOPROLOL TARTRATE 50 MG PO TABS: 50.0000 mg | ORAL_TABLET | Freq: Two times a day (BID) | ORAL | 1 refills | Status: DC

## 2019-07-06 ENCOUNTER — Other Ambulatory Visit: Payer: Self-pay

## 2019-07-06 ENCOUNTER — Ambulatory Visit (INDEPENDENT_AMBULATORY_CARE_PROVIDER_SITE_OTHER): Payer: Medicare Other | Admitting: Family Medicine

## 2019-07-06 DIAGNOSIS — K2100 Gastro-esophageal reflux disease with esophagitis, without bleeding: Secondary | ICD-10-CM

## 2019-07-06 DIAGNOSIS — K5904 Chronic idiopathic constipation: Secondary | ICD-10-CM | POA: Diagnosis not present

## 2019-07-06 MED ORDER — PANTOPRAZOLE SODIUM 40 MG PO TBEC: 40.0000 mg | DELAYED_RELEASE_TABLET | ORAL | 11 refills | Status: DC

## 2019-07-06 NOTE — Progress Notes (Signed)
   Subjective:  Audio only  Patient ID: Dennis [name], male    DOB: 09-07-1951, 68 y.o.   MRN: [id number]  Abdominal Pain This is a new problem. Associated symptoms include belching and nausea. Associated symptoms comments: Change in bowel movements. Has always went in the mornings and no bm in 3 -4 days, feels bloated. Treatments tried: cutting back on tomatoes and alcholol, tums,    Virtual Visit via Telephone Note  I connected with HEITH HAIGLER on 07/06/19 at  3:50 PM EDT by telephone and verified that I am speaking with the correct person using two identifiers.  Location: Patient: home Provider: office   I discussed the limitations, risks, security and privacy concerns of performing an evaluation and management service by telephone and the availability of in person appointments. I also discussed with the patient that there may be a patient responsible charge related to this service. The patient expressed understanding and agreed to proceed.   History of Present Illness:    Observations/Objective:   Assessment and Plan:   Follow Up Instructions:    I discussed the assessment and treatment plan with the patient. The patient was provided an opportunity to ask questions and all were answered. The patient agreed with the plan and demonstrated an understanding of the instructions.   The patient was advised to call back or seek an in-person evaluation if the symptoms worsen or if the condition fails to improve as anticipated.  I provided 25 minutes of non-face-to-face time during this encounter.  Patient presents with burping upper abdominal discomfort belching.  Asked that he takes.  He does take aspirin.  Has also been drinking a fair amount of beer.  He has recently cut back on his beer intake.  Also experiencing intermittent constipation.  He is always had a little bit of blood from stools for his entire adult life.  Colonoscopy within the last several years   Review of  Systems  Gastrointestinal: Positive for abdominal pain and nausea.       Objective:   Physical Exam   Virtual     Assessment & Plan:  Impression reflux discussed.  Add proton pump inhibitor.  Cut back on beer intake  2.  Constipation.  Functional.  Add MiraLAX.  Call us in a couple weeks with progress should be better  Patient had numerous questions.  He means well but he has tremendous anxiety.  He was wondering about the need for full endoscopic work-up from above and below.  He was anxious about this.  On one hand was thinking he may need it.  On the other hand did not want to get it due to COVID-19.  COVID-19 questions answered

## 2019-07-09 ENCOUNTER — Encounter: Payer: Self-pay | Admitting: Family Medicine

## 2019-07-20 ENCOUNTER — Telehealth: Payer: Self-pay | Admitting: Family Medicine

## 2019-07-20 NOTE — Telephone Encounter (Signed)
Would stay on both the miralax and the protonix pretty regularly for the first month, then after that can change to prn if he can tolerate, if he needs to take daily long term, take daily after that

## 2019-07-20 NOTE — Telephone Encounter (Signed)
Patient had a virtual visit two weeks ago and wanting to let you know feeling better. He took medication for 10 days but not taking it now.He wanted to know if you wanted him to continue taking medication? Also he wanted to know if any intestine viruses going around because a member of his family is having the same issue. Please advise

## 2019-07-20 NOTE — Telephone Encounter (Signed)
Patient advised Dr Richardson Landry recommends to stay on both the miralax and the protonix pretty regularly for the first month, then after that can change to prn if he can tolerate, if he needs to take daily long term, take daily after that Patient verbalized understanding.

## 2019-08-01 ENCOUNTER — Other Ambulatory Visit: Payer: Self-pay | Admitting: Family Medicine

## 2019-08-03 NOTE — Telephone Encounter (Signed)
Six mo worth 

## 2019-08-05 DIAGNOSIS — Z23 Encounter for immunization: Secondary | ICD-10-CM | POA: Diagnosis not present

## 2019-09-27 ENCOUNTER — Ambulatory Visit (INDEPENDENT_AMBULATORY_CARE_PROVIDER_SITE_OTHER): Payer: Medicare Other | Admitting: Family Medicine

## 2019-09-27 ENCOUNTER — Other Ambulatory Visit: Payer: Self-pay

## 2019-09-27 DIAGNOSIS — Z20828 Contact with and (suspected) exposure to other viral communicable diseases: Secondary | ICD-10-CM | POA: Diagnosis not present

## 2019-09-27 DIAGNOSIS — J449 Chronic obstructive pulmonary disease, unspecified: Secondary | ICD-10-CM

## 2019-09-27 DIAGNOSIS — Z20822 Contact with and (suspected) exposure to covid-19: Secondary | ICD-10-CM

## 2019-09-27 MED ORDER — PREDNISONE 20 MG PO TABS: ORAL_TABLET | ORAL | 0 refills | Status: DC

## 2019-09-27 MED ORDER — HYDROCODONE-HOMATROPINE 5-1.5 MG/5ML PO SYRP: ORAL_SOLUTION | ORAL | 0 refills | Status: DC

## 2019-09-27 MED ORDER — ALBUTEROL SULFATE (2.5 MG/3ML) 0.083% IN NEBU: 2.5000 mg | INHALATION_SOLUTION | Freq: Four times a day (QID) | RESPIRATORY_TRACT | 3 refills | Status: DC | PRN

## 2019-09-27 MED ORDER — CEFPROZIL 500 MG PO TABS: ORAL_TABLET | ORAL | 0 refills | Status: DC

## 2019-09-27 NOTE — Progress Notes (Signed)
   Subjective:  Audio only  Patient ID: Dennis [name], male    DOB: [birth year], 68 y.o.   MRN: [id number]  Sore Throat  This is a new problem. Associated symptoms comments: Has sore throat on Sunday, went into sinus drainage and cough. Pt had an antibiotic left over that he had last year. No appetite on Monday. Clear mucus. Pt has COPD. No fever, chills off and on, trouble breathing at times. Pt went hunting on Saturday and became hot and sweating while tracking a deer and pulled off all clothes and it was cold that morning. Pt states that grandchildren have also had sore throat. . Treatments tried: Mucinex DM; neb treatments; inhaler.   Pt states he wants antibiotics for the sinus issues. Pt states he will also need refill for neb machine.   Virtual Visit via Telephone Note  I connected with Dennis [name] on 09/27/19 at  3:50 PM EST by telephone and verified that I am speaking with the correct person using two identifiers.  Location: Patient: home Provider: office   I discussed the limitations, risks, security and privacy concerns of performing an evaluation and management service by telephone and the availability of in person appointments. I also discussed with the patient that there may be a patient responsible charge related to this service. The patient expressed understanding and agreed to proceed.   History of Present Illness:    Observations/Objective:   Assessment and Plan:   Follow Up Instructions:    I discussed the assessment and treatment plan with the patient. The patient was provided an opportunity to ask questions and all were answered. The patient agreed with the plan and demonstrated an understanding of the instructions.   The patient was advised to call back or seek an in-person evaluation if the symptoms worsen or if the condition fails to improve as anticipated.  I provided 18 minutes of non-face-to-face time during this encounter.   Vicente Males, LPN   Patient has had development of congestion.  Some frontal pressure and headache.  Also cough.  Some wheeziness at times.  No excessive shortness of breath.  Cough is productive.  Unfortunately was around grandchildren who had some respiratory symptoms  Review of Systems No headache no chest pain no back pain    Objective:   Physical Exam   Virtual     Assessment & Plan:  Rhinosinusitis/exacerbation of COPD.  We will do prednisone taper.  Antibiotics.  Refill albuterol.  Warning signs discussed carefully.  Also feel COVID-19 testing is warranted discussed and we will press on

## 2019-09-28 ENCOUNTER — Telehealth: Payer: Self-pay | Admitting: Family Medicine

## 2019-09-28 ENCOUNTER — Encounter: Payer: Self-pay | Admitting: Family Medicine

## 2019-09-28 ENCOUNTER — Other Ambulatory Visit: Payer: Self-pay

## 2019-09-28 DIAGNOSIS — Z20822 Contact with and (suspected) exposure to covid-19: Secondary | ICD-10-CM

## 2019-09-28 DIAGNOSIS — Z20828 Contact with and (suspected) exposure to other viral communicable diseases: Secondary | ICD-10-CM | POA: Diagnosis not present

## 2019-09-28 MED ORDER — ALBUTEROL SULFATE (2.5 MG/3ML) 0.083% IN NEBU: 2.5000 mg | INHALATION_SOLUTION | Freq: Four times a day (QID) | RESPIRATORY_TRACT | 3 refills | Status: DC | PRN

## 2019-09-28 NOTE — Telephone Encounter (Signed)
So I do not think allergic to pred because that is  Closely related to a natural body hormone called cortisol, true allergy almost unheard of, since feeling much better, can go ahead and hold the dose for now, if wheezing was to get worse, would start the med but a t a redeuced dose, taking one less tablet per day than then prescription calls for I e two per day for three d one per day for three d, then stop

## 2019-09-28 NOTE — Telephone Encounter (Signed)
Left message to return call 

## 2019-09-28 NOTE — Telephone Encounter (Signed)
Patient calling to make sure Prednisone isn't the medicine that caused a rash last year.  I don't see anything in the chart about his other than patient had a rash from when he went to ortho doc and got an injection.  Also wanted to let Dr. Richardson Landry know that the antibiotic that he gave him yesterday is a wonder drug and he feels better today but is still going for his covid test this morning.

## 2019-09-28 NOTE — Telephone Encounter (Signed)
Exacerbation of COPD we will have to look up code

## 2019-09-28 NOTE — Telephone Encounter (Signed)
Pt contacted and verbalized understanding.  

## 2019-09-28 NOTE — Addendum Note (Signed)
Addended by: Vicente Males on: 09/28/2019 11:54 AM   Modules accepted: Orders

## 2019-09-28 NOTE — Telephone Encounter (Signed)
Medication sent to pharmacy. Informed pt that we would send this in when I had spoke with him regarding previous message.

## 2019-09-28 NOTE — Telephone Encounter (Signed)
Pharmacy is needing a diagnosis code and ICD 10 code on nebulizer solution script since pt had Medicare Part B. Once the diagnosis code is put on script, they will be able to fill it. Please advise. Thank you

## 2019-09-28 NOTE — Telephone Encounter (Signed)
Pt returned call and states that he is not sure if the Prednisone was one of the meds that caused him to have a rash. Per patient record he had an injection at ortho doctor and had a rash. Pt is wanting to know if he should take this medication or hold off. Pt has not taken any of the prednisone as of yet.  Pt states he is not allergic to Prednisone. Pt states he is feeling better this morning and did have COVID test done. Please advise. Thank you

## 2019-09-29 LAB — NOVEL CORONAVIRUS, NAA: SARS-CoV-2, NAA: NOT DETECTED

## 2019-10-09 ENCOUNTER — Other Ambulatory Visit: Payer: Self-pay | Admitting: Family Medicine

## 2019-10-09 DIAGNOSIS — E785 Hyperlipidemia, unspecified: Secondary | ICD-10-CM

## 2019-10-24 ENCOUNTER — Telehealth: Payer: Self-pay | Admitting: *Deleted

## 2019-10-24 ENCOUNTER — Other Ambulatory Visit: Payer: Self-pay | Admitting: *Deleted

## 2019-10-24 DIAGNOSIS — E785 Hyperlipidemia, unspecified: Secondary | ICD-10-CM

## 2019-10-24 MED ORDER — EZETIMIBE-SIMVASTATIN 10-40 MG PO TABS: ORAL_TABLET | ORAL | 3 refills | Status: DC

## 2019-10-24 MED ORDER — PREDNISONE 20 MG PO TABS: ORAL_TABLET | ORAL | 0 refills | Status: DC

## 2019-10-24 MED ORDER — CEPHALEXIN 500 MG PO CAPS: 500.0000 mg | ORAL_CAPSULE | Freq: Three times a day (TID) | ORAL | 0 refills | Status: DC

## 2019-10-24 NOTE — Telephone Encounter (Signed)
Ok on the Vytorin via paper prescription 1 years worth  Adult prednisone taper.  Keflex 500 mg 3 times daily 10 days symptom care discussed

## 2019-10-24 NOTE — Telephone Encounter (Signed)
Scripts ready and pt notified.

## 2019-10-24 NOTE — Telephone Encounter (Signed)
Pt would like a paper script to take [facility] for vytorin. He lost his med and insurance will not cover again so he found [facility] has the cheapest cash price.   Pt seen 11/3 and finished meds and got better. covid test negative 11/4. States he was exposed to some wood burning in a barrel on Thursday and since then he is having scratchy throat and sob when walking around the house. Inhaler and neb treatments help for a short while. Coughing up clear stuff.   [facility]  Call pt back on [phone]

## 2019-12-13 ENCOUNTER — Other Ambulatory Visit: Payer: Self-pay

## 2019-12-13 ENCOUNTER — Ambulatory Visit (INDEPENDENT_AMBULATORY_CARE_PROVIDER_SITE_OTHER): Payer: Medicare Other | Admitting: Family Medicine

## 2019-12-13 DIAGNOSIS — K921 Melena: Secondary | ICD-10-CM

## 2019-12-13 MED ORDER — HYDROCORTISONE ACETATE 25 MG RE SUPP: RECTAL | 0 refills | Status: DC

## 2019-12-13 MED ORDER — ALPRAZOLAM 0.5 MG PO TABS: ORAL_TABLET | ORAL | 5 refills | Status: DC

## 2019-12-13 NOTE — Progress Notes (Signed)
   Subjective:  Audio video  Patient ID: Dennis [name], male    DOB: April 25, 1951, 69 y.o.   MRN: [id number]  HPI Pt is having blood in stools. This issue comes and goes. Last 4-5 days it has become regular. Pt has been diagnosed with colitis. Usually bright red but sometimes some of the stool is black looking. Pt states that for a couple months he did fine. Pt does recognize that some types of food he eats can cause blood in stool.   Pt also would like provider recommendation on COVID vaccine.   Virtual Visit via Telephone Note  I connected with Dennis [name] on 12/13/19 at  8:30 AM EST by telephone and verified that I am speaking with the correct person using two identifiers.  Location: Patient: home Provider: office    I discussed the limitations, risks, security and privacy concerns of performing an evaluation and management service by telephone and the availability of in person appointments. I also discussed with the patient that there may be a patient responsible charge related to this service. The patient expressed understanding and agreed to proceed.   History of Present Illness:    Observations/Objective:   Assessment and Plan:   Follow Up Instructions:    I discussed the assessment and treatment plan with the patient. The patient was provided an opportunity to ask questions and all were answered. The patient agreed with the plan and demonstrated an understanding of the instructions.   The patient was advised to call back or seek an in-person evaluation if the symptoms worsen or if the condition fails to improve as anticipated.  I provided 20 minutes of non-face-to-face time during this encounter.       Review of Systems No headache, no major weight loss or weight gain, no chest pain no back pain abdominal pain no change in bowel habits complete ROS otherwise negative     Objective:   Physical Exam  Virtual     Assessment & Plan:  Impression blood per  stools the last 5 days.  Review of colonoscopy reveals internal hemorrhoids 4 years ago.  At that time Dr. Buford Dresser recommended a follow-up colonoscopy in 5 years which would be the fall of this year.  No change in bowel habits.  Has been having some hot spicy foods and may have stirred up hemorrhoids.  Discussed.  Will give trial of Anusol suppository twice daily.  Likely wait on colonoscopy till the fall.  Questions answered regarding Covid.  Recommend complete physical in April

## 2019-12-16 ENCOUNTER — Other Ambulatory Visit: Payer: Self-pay | Admitting: Family Medicine

## 2019-12-29 ENCOUNTER — Encounter: Payer: Self-pay | Admitting: Family Medicine

## 2020-01-02 ENCOUNTER — Telehealth: Payer: Self-pay | Admitting: Family Medicine

## 2020-01-02 DIAGNOSIS — E785 Hyperlipidemia, unspecified: Secondary | ICD-10-CM

## 2020-01-02 DIAGNOSIS — Z79899 Other long term (current) drug therapy: Secondary | ICD-10-CM

## 2020-01-02 DIAGNOSIS — I10 Essential (primary) hypertension: Secondary | ICD-10-CM

## 2020-01-02 DIAGNOSIS — Z125 Encounter for screening for malignant neoplasm of prostate: Secondary | ICD-10-CM

## 2020-01-02 NOTE — Telephone Encounter (Signed)
Blood work ordered in Epic. Left message to return call to notify patient. 

## 2020-01-02 NOTE — Telephone Encounter (Signed)
Pt is aware labs have been ordered.

## 2020-01-02 NOTE — Telephone Encounter (Signed)
Pt has CPE scheduled 2/22 and would like to have lab work done before.

## 2020-01-02 NOTE — Telephone Encounter (Signed)
Lip liv m7 psa cbc

## 2020-01-02 NOTE — Telephone Encounter (Signed)
Last labs 09/2018: Lipid, Liver and Met 7 

## 2020-01-04 DIAGNOSIS — Z125 Encounter for screening for malignant neoplasm of prostate: Secondary | ICD-10-CM | POA: Diagnosis not present

## 2020-01-04 DIAGNOSIS — I10 Essential (primary) hypertension: Secondary | ICD-10-CM | POA: Diagnosis not present

## 2020-01-04 DIAGNOSIS — E785 Hyperlipidemia, unspecified: Secondary | ICD-10-CM | POA: Diagnosis not present

## 2020-01-04 DIAGNOSIS — Z79899 Other long term (current) drug therapy: Secondary | ICD-10-CM | POA: Diagnosis not present

## 2020-01-05 LAB — CBC WITH DIFFERENTIAL/PLATELET
Basophils Absolute: 0.1 10*3/uL (ref 0.0–0.2)
Basos: 1 %
EOS (ABSOLUTE): 0.3 10*3/uL (ref 0.0–0.4)
Eos: 4 %
Hematocrit: 43.8 % (ref 37.5–51.0)
Hemoglobin: 14.2 g/dL (ref 13.0–17.7)
Immature Grans (Abs): 0 10*3/uL (ref 0.0–0.1)
Immature Granulocytes: 1 %
Lymphocytes Absolute: 2.2 10*3/uL (ref 0.7–3.1)
Lymphs: 30 %
MCH: 30.5 pg (ref 26.6–33.0)
MCHC: 32.4 g/dL (ref 31.5–35.7)
MCV: 94 fL (ref 79–97)
Monocytes Absolute: 0.8 10*3/uL (ref 0.1–0.9)
Monocytes: 11 %
Neutrophils Absolute: 3.9 10*3/uL (ref 1.4–7.0)
Neutrophils: 53 %
Platelets: 147 10*3/uL — ABNORMAL LOW (ref 150–450)
RBC: 4.65 x10E6/uL (ref 4.14–5.80)
RDW: 12 % (ref 11.6–15.4)
WBC: 7.4 10*3/uL (ref 3.4–10.8)

## 2020-01-05 LAB — LIPID PANEL
Chol/HDL Ratio: 6.6 ratio — ABNORMAL HIGH (ref 0.0–5.0)
Cholesterol, Total: 252 mg/dL — ABNORMAL HIGH (ref 100–199)
HDL: 38 mg/dL — ABNORMAL LOW (ref >39)
LDL Chol Calc (NIH): 162 mg/dL — ABNORMAL HIGH (ref 0–99)
Triglycerides: 278 mg/dL — ABNORMAL HIGH (ref 0–149)
VLDL Cholesterol Cal: 52 mg/dL — ABNORMAL HIGH (ref 5–40)

## 2020-01-05 LAB — HEPATIC FUNCTION PANEL
ALT: 15 IU/L (ref 0–44)
AST: 20 IU/L (ref 0–40)
Albumin: 4.3 g/dL (ref 3.8–4.8)
Alkaline Phosphatase: 28 IU/L — ABNORMAL LOW (ref 39–117)
Bilirubin Total: 0.4 mg/dL (ref 0.0–1.2)
Bilirubin, Direct: 0.16 mg/dL (ref 0.00–0.40)
Total Protein: 6.5 g/dL (ref 6.0–8.5)

## 2020-01-05 LAB — BASIC METABOLIC PANEL
BUN/Creatinine Ratio: 15 (ref 10–24)
BUN: 20 mg/dL (ref 8–27)
CO2: 30 mmol/L — ABNORMAL HIGH (ref 20–29)
Calcium: 9.2 mg/dL (ref 8.6–10.2)
Chloride: 99 mmol/L (ref 96–106)
Creatinine, Ser: 1.37 mg/dL — ABNORMAL HIGH (ref 0.76–1.27)
GFR calc Af Amer: 62 mL/min/{1.73_m2} (ref >59)
GFR calc non Af Amer: 53 mL/min/{1.73_m2} — ABNORMAL LOW (ref >59)
Glucose: 112 mg/dL — ABNORMAL HIGH (ref 65–99)
Potassium: 5 mmol/L (ref 3.5–5.2)
Sodium: 140 mmol/L (ref 134–144)

## 2020-01-05 LAB — PSA: Prostate Specific Ag, Serum: 0.8 ng/mL (ref 0.0–4.0)

## 2020-01-13 ENCOUNTER — Telehealth: Payer: Self-pay | Admitting: Family Medicine

## 2020-01-13 NOTE — Telephone Encounter (Signed)
Called pt to prescreen. He passed the prescreen and asked if Dr. Richardson Landry would like him to come in or hold off and push CPE out a month. He has not been able to get the vaccine yet. I did tell pt we are cleaning the lobby 4 times a day and pulling pts back as quick as we can so they are not sitting out in waiting area but he wanted to get Dr. Jeannine Kitten thoughts.

## 2020-01-13 NOTE — Telephone Encounter (Signed)
Please advise. Thank you

## 2020-01-13 NOTE — Telephone Encounter (Signed)
Wait  a month or six wks is fine

## 2020-01-13 NOTE — Telephone Encounter (Signed)
Pt contacted and states that he would like to reschedule for 6 weeks out. Pt transferred up front to reschedule

## 2020-01-16 ENCOUNTER — Ambulatory Visit (INDEPENDENT_AMBULATORY_CARE_PROVIDER_SITE_OTHER): Payer: Medicare Other | Admitting: Family Medicine

## 2020-01-16 ENCOUNTER — Ambulatory Visit: Payer: Medicare Other | Admitting: Family Medicine

## 2020-01-16 ENCOUNTER — Encounter: Payer: Self-pay | Admitting: Family Medicine

## 2020-01-16 ENCOUNTER — Other Ambulatory Visit: Payer: Self-pay

## 2020-01-16 ENCOUNTER — Telehealth: Payer: Self-pay | Admitting: Family Medicine

## 2020-01-16 VITALS — BP 120/72 | Temp 97.9°F | Ht 73.0 in | Wt 222.0 lb

## 2020-01-16 DIAGNOSIS — I10 Essential (primary) hypertension: Secondary | ICD-10-CM | POA: Diagnosis not present

## 2020-01-16 DIAGNOSIS — R7301 Impaired fasting glucose: Secondary | ICD-10-CM

## 2020-01-16 DIAGNOSIS — Z0000 Encounter for general adult medical examination without abnormal findings: Secondary | ICD-10-CM

## 2020-01-16 DIAGNOSIS — E785 Hyperlipidemia, unspecified: Secondary | ICD-10-CM | POA: Diagnosis not present

## 2020-01-16 DIAGNOSIS — J449 Chronic obstructive pulmonary disease, unspecified: Secondary | ICD-10-CM | POA: Diagnosis not present

## 2020-01-16 MED ORDER — LOSARTAN POTASSIUM-HCTZ 100-25 MG PO TABS: 1.0000 | ORAL_TABLET | Freq: Every day | ORAL | 1 refills | Status: DC

## 2020-01-16 MED ORDER — KETOCONAZOLE 2 % EX CREA: 1.0000 "application " | TOPICAL_CREAM | Freq: Two times a day (BID) | CUTANEOUS | 5 refills | Status: DC

## 2020-01-16 MED ORDER — BUDESONIDE-FORMOTEROL FUMARATE 160-4.5 MCG/ACT IN AERO: INHALATION_SPRAY | RESPIRATORY_TRACT | 1 refills | Status: DC

## 2020-01-16 MED ORDER — METOPROLOL TARTRATE 50 MG PO TABS: 50.0000 mg | ORAL_TABLET | Freq: Two times a day (BID) | ORAL | 1 refills | Status: DC

## 2020-01-16 MED ORDER — EZETIMIBE-SIMVASTATIN 10-40 MG PO TABS: ORAL_TABLET | ORAL | 1 refills | Status: DC

## 2020-01-16 MED ORDER — PANTOPRAZOLE SODIUM 40 MG PO TBEC: 40.0000 mg | DELAYED_RELEASE_TABLET | ORAL | 5 refills | Status: DC

## 2020-01-16 MED ORDER — FENOFIBRATE 160 MG PO TABS: 160.0000 mg | ORAL_TABLET | Freq: Every day | ORAL | 1 refills | Status: DC

## 2020-01-16 NOTE — Telephone Encounter (Signed)
yes

## 2020-01-16 NOTE — Progress Notes (Signed)
Subjective:    Patient ID: Dennis [name], male    DOB: 01-30-1951, 69 y.o.   MRN: [id number]  HPI  The patient comes in today for a wellness visit.    A review of their health history was completed.  A review of medications was also completed.  Any needed refills; yes  Eating habits: not as good as should  Falls/  MVA accidents in past few months: none  Regular exercise: very little  Specialist pt sees on regular basis: none  Preventative health issues were discussed.   Additional concerns: discuss recent labs, blood pressure runs high at night, itchy patch of skin on scrotum, depression-patient is completing depression screening.  Results for orders placed or performed in visit on 01/02/20  Lipid panel  Result Value Ref Range   Cholesterol, Total 252 (H) 100 - 199 mg/dL   Triglycerides 278 (H) 0 - 149 mg/dL   HDL 38 (L) >39 mg/dL   VLDL Cholesterol Cal 52 (H) 5 - 40 mg/dL   LDL Chol Calc (NIH) 162 (H) 0 - 99 mg/dL   Chol/HDL Ratio 6.6 (H) 0.0 - 5.0 ratio  Hepatic function panel  Result Value Ref Range   Total Protein 6.5 6.0 - 8.5 g/dL   Albumin 4.3 3.8 - 4.8 g/dL   Bilirubin Total 0.4 0.0 - 1.2 mg/dL   Bilirubin, Direct 0.16 0.00 - 0.40 mg/dL   Alkaline Phosphatase 28 (L) 39 - 117 IU/L   AST 20 0 - 40 IU/L   ALT 15 0 - 44 IU/L  Basic metabolic panel  Result Value Ref Range   Glucose 112 (H) 65 - 99 mg/dL   BUN 20 8 - 27 mg/dL   Creatinine, Ser 1.37 (H) 0.76 - 1.27 mg/dL   GFR calc non Af Amer 53 (L) >59 mL/min/1.73   GFR calc Af Amer 62 >59 mL/min/1.73   BUN/Creatinine Ratio 15 10 - 24   Sodium 140 134 - 144 mmol/L   Potassium 5.0 3.5 - 5.2 mmol/L   Chloride 99 96 - 106 mmol/L   CO2 30 (H) 20 - 29 mmol/L   Calcium 9.2 8.6 - 10.2 mg/dL  PSA  Result Value Ref Range   Prostate Specific Ag, Serum 0.8 0.0 - 4.0 ng/mL  CBC with Differential/Platelet  Result Value Ref Range   WBC 7.4 3.4 - 10.8 x10E3/uL   RBC 4.65 4.14 - 5.80 x10E6/uL   Hemoglobin 14.2  13.0 - 17.7 g/dL   Hematocrit 43.8 37.5 - 51.0 %   MCV 94 79 - 97 fL   MCH 30.5 26.6 - 33.0 pg   MCHC 32.4 31.5 - 35.7 g/dL   RDW 12.0 11.6 - 15.4 %   Platelets 147 (L) 150 - 450 x10E3/uL   Neutrophils 53 Not Estab. %   Lymphs 30 Not Estab. %   Monocytes 11 Not Estab. %   Eos 4 Not Estab. %   Basos 1 Not Estab. %   Neutrophils Absolute 3.9 1.4 - 7.0 x10E3/uL   Lymphocytes Absolute 2.2 0.7 - 3.1 x10E3/uL   Monocytes Absolute 0.8 0.1 - 0.9 x10E3/uL   EOS (ABSOLUTE) 0.3 0.0 - 0.4 x10E3/uL   Basophils Absolute 0.1 0.0 - 0.2 x10E3/uL   Immature Granulocytes 1 Not Estab. %   Immature Grans (Abs) 0.0 0.0 - 0.1 x10E3/uL    Blood pressure medicine and blood pressure levels reviewed today with patient. Compliant with blood pressure medicine. States does not miss a dose. No obvious side effects.  Blood pressure generally good when checked elsewhere. Watching salt intake.   Patient continues to take lipid medication regularly. No obvious side effects from it. Generally does not miss a dose. Prior blood work results are reviewed with patient. Patient continues to work on fat intake in diet   Eve bp has been on the high side, worse in the eve  bp good this morning  Patient Added one half tab in the eve in the past week  Patient also notes scrotal itching and a scaly rash that has come up.      Review of Systems No headache no chest pain no fever no cough no significant abdominal pain    Objective:   Physical Exam Alert active good hydration.  Blood pressure good on repeat 126/70  No acute distress  HEENT normal.  Neck supple.  No thyroid megaly carotid pulses good  Lungs clear.  Heart regular rate and rhythm.  Abdomen soft nontender no discrete masses  Scrotal region testicles within normal limits mild scaly rash noted no hernias  Prostate within normal limits  Feet arterial pulses good  Knees good range of motion cartilage no palpable abnormalities  No ankle  edema       Assessment & Plan:  Impression 1 wellness exam.  Diet discussed.  Exercise discussed.  Vaccines discussed planning on getting the Covid vaccine soon.  Blood work reviewed.  Prostate test fine.  Hoping to get a colonoscopy this fall as  pandemic calms down  2.  Hypertension.  Blood pressure good control.  Patient recently adjusted medications on his own.  He has decided to go back to his regional dose and work hard on diet and exercise and salt intake and see if he can maintain blood pressure in good range  3.  Hyperlipidemia.  Suboptimal but overall good for the patient, historically his LDL has been much higher.  4.  Renal function improved compared to last year and stable  5.  Elevated fasting sugar.  Discussed encouraged to cut down sugars  Follow-up in 6 months.  Diet exercise discussed.  Medications refilled.

## 2020-01-16 NOTE — Telephone Encounter (Signed)
Would you be able to finish today's note by tomorrow so pt can pickup. If not please let us know when pt can pick up

## 2020-01-16 NOTE — Telephone Encounter (Signed)
Note and labs will be ready for medical records to print and give to pt tomorrow.

## 2020-01-16 NOTE — Telephone Encounter (Signed)
Patient would like copy of today visit and his labs that were discuss at his physical. Patient would like to pick up tomorrow. Please advise

## 2020-01-19 DIAGNOSIS — Z23 Encounter for immunization: Secondary | ICD-10-CM | POA: Diagnosis not present

## 2020-01-25 ENCOUNTER — Telehealth: Payer: Self-pay | Admitting: Family Medicine

## 2020-01-25 ENCOUNTER — Other Ambulatory Visit: Payer: Self-pay | Admitting: Family Medicine

## 2020-01-25 ENCOUNTER — Other Ambulatory Visit: Payer: Self-pay | Admitting: *Deleted

## 2020-01-25 MED ORDER — ZOLPIDEM TARTRATE 10 MG PO TABS: 10.0000 mg | ORAL_TABLET | Freq: Every evening | ORAL | 1 refills | Status: DC | PRN

## 2020-01-25 NOTE — Telephone Encounter (Signed)
6 mo 

## 2020-01-25 NOTE — Telephone Encounter (Signed)
Script printed. Will fax and call pt after dr Richardson Landry signs

## 2020-01-25 NOTE — Telephone Encounter (Signed)
Patient notified

## 2020-01-25 NOTE — Telephone Encounter (Signed)
Patient is requesting refill on Ambien 10 mg will be out next week.He was last seen 01/16/2020.[facility]

## 2020-02-14 ENCOUNTER — Encounter: Payer: Medicare Other | Admitting: Family Medicine

## 2020-02-17 DIAGNOSIS — Z23 Encounter for immunization: Secondary | ICD-10-CM | POA: Diagnosis not present

## 2020-02-21 ENCOUNTER — Encounter: Payer: Self-pay | Admitting: Family Medicine

## 2020-02-21 ENCOUNTER — Ambulatory Visit (INDEPENDENT_AMBULATORY_CARE_PROVIDER_SITE_OTHER): Payer: Medicare Other | Admitting: Family Medicine

## 2020-02-21 ENCOUNTER — Other Ambulatory Visit: Payer: Self-pay

## 2020-02-21 VITALS — BP 128/74 | Temp 97.4°F | Ht 73.0 in | Wt 221.0 lb

## 2020-02-21 DIAGNOSIS — M792 Neuralgia and neuritis, unspecified: Secondary | ICD-10-CM | POA: Diagnosis not present

## 2020-02-21 DIAGNOSIS — I25119 Atherosclerotic heart disease of native coronary artery with unspecified angina pectoris: Secondary | ICD-10-CM

## 2020-02-21 MED ORDER — CLOTRIMAZOLE-BETAMETHASONE 1-0.05 % EX CREA: TOPICAL_CREAM | CUTANEOUS | 2 refills | Status: DC

## 2020-02-21 NOTE — Progress Notes (Signed)
   Subjective:    Patient ID: Dennis [name], male    DOB: 05/15/1951, 68 y.o.   MRN: [id number]  Headache  This is a new problem. Episode onset: one week. The pain is located in the right unilateral region. Treatments tried: aleve, xanax.  pt states he was with wife yesterday at her ENT appt and told him about his symptoms and he suggested it may be temporal arteritis.    Pt has been experiencing sensitivity in the scalp and in the ear  Every now and then sharp shooting pains  Took a pain pill and it got better  Pt is concerned about temporal arteritis  ketocon is not reall helping the rash  Tried it faithfully, but is did not help, not improved on the ketoconazole     Using ketoconazole cream for issue with scrotum and it is not helping.   Review of Systems  Neurological: Positive for headaches.       Objective:   Physical Exam Alert and oriented, vitals reviewed and stable, NAD ENT-TM's and ext canals WNL bilat via otoscopic exam Soft palate, tonsils and post pharynx WNL via oropharyngeal exam Neck-symmetric, no masses; thyroid nonpalpable and nontender Pulmonary-no tachypnea or accessory muscle use; Clear without wheezes via auscultation Card--no abnrml murmurs, rhythm reg and rate WNL Carotid pulses symmetric, without bruits        Assessment & Plan:  Impression neuropathic headaches.  Trigeminal distribution.  With sudden sharp shooting lancinating pains.  Hyper sensation across the ear.  No tenderness in the area of the temporal artery.  For these reasons recommend holding off on temporal arteritis work-up.  Discussed at length questions answered symptom care discussed expect gradual resolution  Greater than 50% of this 30 minute face to face visit was spent in counseling and discussion and coordination of care regarding the above diagnosis/diagnosies

## 2020-02-22 ENCOUNTER — Ambulatory Visit (INDEPENDENT_AMBULATORY_CARE_PROVIDER_SITE_OTHER): Payer: Medicare Other | Admitting: Cardiology

## 2020-02-22 ENCOUNTER — Telehealth: Payer: Self-pay | Admitting: Cardiology

## 2020-02-22 ENCOUNTER — Encounter: Payer: Self-pay | Admitting: Cardiology

## 2020-02-22 VITALS — BP 115/70 | HR 63 | Ht 73.0 in | Wt 221.0 lb

## 2020-02-22 DIAGNOSIS — I25119 Atherosclerotic heart disease of native coronary artery with unspecified angina pectoris: Secondary | ICD-10-CM

## 2020-02-22 DIAGNOSIS — Z8679 Personal history of other diseases of the circulatory system: Secondary | ICD-10-CM | POA: Diagnosis not present

## 2020-02-22 DIAGNOSIS — E782 Mixed hyperlipidemia: Secondary | ICD-10-CM | POA: Diagnosis not present

## 2020-02-22 MED ORDER — NITROGLYCERIN 0.4 MG SL SUBL: 0.4000 mg | SUBLINGUAL_TABLET | SUBLINGUAL | 3 refills | Status: DC | PRN

## 2020-02-22 NOTE — Telephone Encounter (Signed)
Has a question he'd like to ask Dr. Domenic Polite that he forgot to ask at his apt  [phone]

## 2020-02-22 NOTE — Patient Instructions (Addendum)
Medication Instructions:  Use nitroglycerin as directed  *If you need a refill on your cardiac medications before your next appointment, please call your pharmacy*   Lab Work: None today If you have labs (blood work) drawn today and your tests are completely normal, you will receive your results only by:  [place] (if you have MyChart) OR  A paper copy in the mail If you have any lab test that is abnormal or we need to change your treatment, we will call you to review the results.   Testing/Procedures: Your physician has requested that you have a lexiscan myoview. For further information please visit [email or web address]. Please follow instruction sheet, as given.     Follow-Up: At [facility], you and your health needs are our priority.  As part of our continuing mission to provide you with exceptional heart care, we have created designated Provider Care Teams.  These Care Teams include your primary Cardiologist (physician) and Advanced Practice Providers (APPs -  Physician Assistants and Nurse Practitioners) who all work together to provide you with the care you need, when you need it.  We recommend signing up for the patient portal called "MyChart".  Sign up information is provided on this After Visit Summary.  MyChart is used to connect with patients for Virtual Visits (Telemedicine).  Patients are able to view lab/test results, encounter notes, upcoming appointments, etc.  Non-urgent messages can be sent to your provider as well.   To learn more about what you can do with MyChart, go to [email or web address].    Your next appointment:  We will call you with results, follow up to be determined after test.     Thank you for choosing [facility] !  Nitroglycerin sublingual tablets What is this medicine? NITROGLYCERIN (nye troe GLI ser in) is a type of vasodilator. It relaxes blood vessels, increasing the blood and oxygen supply to your heart.  This medicine is used to relieve chest pain caused by angina. It is also used to prevent chest pain before activities like climbing stairs, going outdoors in cold weather, or sexual activity. This medicine may be used for other purposes; ask your health care provider or pharmacist if you have questions. COMMON BRAND NAME(S): Nitroquick, Nitrostat, Nitrotab What should I tell my health care provider before I take this medicine? They need to know if you have any of these conditions:  anemia  head injury, recent stroke, or bleeding in the brain  liver disease  previous heart attack  an unusual or allergic reaction to nitroglycerin, other medicines, foods, dyes, or preservatives  pregnant or trying to get pregnant  breast-feeding How should I use this medicine? Take this medicine by mouth as needed. At the first sign of an angina attack (chest pain or tightness) place one tablet under your tongue. You can also take this medicine 5 to 10 minutes before an event likely to produce chest pain. Follow the directions on the prescription label. Let the tablet dissolve under the tongue. Do not swallow whole. Replace the dose if you accidentally swallow it. It will help if your mouth is not dry. Saliva around the tablet will help it to dissolve more quickly. Do not eat or drink, smoke or chew tobacco while a tablet is dissolving. If you are not better within 5 minutes after taking ONE dose of nitroglycerin, call 9-1-1 immediately to seek emergency medical care. Do not take more than 3 nitroglycerin tablets over 15 minutes. If you take this medicine  often to relieve symptoms of angina, your doctor or health care professional may provide you with different instructions to manage your symptoms. If symptoms do not go away after following these instructions, it is important to call 9-1-1 immediately. Do not take more than 3 nitroglycerin tablets over 15 minutes. Talk to your pediatrician regarding the use of  this medicine in children. Special care may be needed. Overdosage: If you think you have taken too much of this medicine contact a poison control center or emergency room at once. NOTE: This medicine is only for you. Do not share this medicine with others. What if I miss a dose? This does not apply. This medicine is only used as needed. What may interact with this medicine? Do not take this medicine with any of the following medications:  certain migraine medicines like ergotamine and dihydroergotamine (DHE)  medicines used to treat erectile dysfunction like sildenafil, tadalafil, and vardenafil  riociguat This medicine may also interact with the following medications:  alteplase  aspirin  heparin  medicines for high blood pressure  medicines for mental depression  other medicines used to treat angina  phenothiazines like chlorpromazine, mesoridazine, prochlorperazine, thioridazine This list may not describe all possible interactions. Give your health care provider a list of all the medicines, herbs, non-prescription drugs, or dietary supplements you use. Also tell them if you smoke, drink alcohol, or use illegal drugs. Some items may interact with your medicine. What should I watch for while using this medicine? Tell your doctor or health care professional if you feel your medicine is no longer working. Keep this medicine with you at all times. Sit or lie down when you take your medicine to prevent falling if you feel dizzy or faint after using it. Try to remain calm. This will help you to feel better faster. If you feel dizzy, take several deep breaths and lie down with your feet propped up, or bend forward with your head resting between your knees. You may get drowsy or dizzy. Do not drive, use machinery, or do anything that needs mental alertness until you know how this drug affects you. Do not stand or sit up quickly, especially if you are an older patient. This reduces the risk  of dizzy or fainting spells. Alcohol can make you more drowsy and dizzy. Avoid alcoholic drinks. Do not treat yourself for coughs, colds, or pain while you are taking this medicine without asking your doctor or health care professional for advice. Some ingredients may increase your blood pressure. What side effects may I notice from receiving this medicine? Side effects that you should report to your doctor or health care professional as soon as possible:  blurred vision  dry mouth  skin rash  sweating  the feeling of extreme pressure in the head  unusually weak or tired Side effects that usually do not require medical attention (report to your doctor or health care professional if they continue or are bothersome):  flushing of the face or neck  headache  irregular heartbeat, palpitations  nausea, vomiting This list may not describe all possible side effects. Call your doctor for medical advice about side effects. You may report side effects to FDA at 1-800-FDA-1088. Where should I keep my medicine? Keep out of the reach of children. Store at room temperature between 20 and 25 degrees C (68 and 77 degrees F). Store in [employer]. Protect from light and moisture. Keep tightly closed. Throw away any unused medicine after the expiration date. NOTE:  This sheet is a summary. It may not cover all possible information. If you have questions about this medicine, talk to your doctor, pharmacist, or health care provider.  2020 Elsevier/Gold Standard (2013-09-08 17:57:36)

## 2020-02-22 NOTE — Telephone Encounter (Signed)
Patient  Has 2 questions, why does he hear his pulse in his ears for the past 8-10 months/ and he had a "bad feeling" after he was given medication to raise his heart rate and he does not want nuclear imaging test.He also cannot walk on a treadmill.

## 2020-02-22 NOTE — Telephone Encounter (Signed)
I spoke with wife as patient not home.He will call back to schedule carotid US

## 2020-02-22 NOTE — Progress Notes (Signed)
Cardiology Office Note  Date: 02/22/2020   ID: RAYE BICKHART, DOB 02-15-1951, MRN [id number]  PCP:  Mikey Kirschner, MD  Cardiologist:  Rozann Lesches, MD Electrophysiologist:  None   Chief Complaint  Patient presents with  . Cardiac follow-up    History of Present Illness: Dennis [name] is a 69 y.o. male last seen in November 2018.  He presents today with his wife for a follow-up visit.  He does not report any significant palpitations since last assessment.  Does mention occasional brief, shock-like sensations in the left side of his chest, not precipitated by exertion.  He does not have nitroglycerin at this time.  He is concerned about the status of his heart vessels and bypasses.  Last ischemic evaluation was in 2015.  Medical therapy includes aspirin, Vytorin, Hyzaar, and Lopressor.  I personally reviewed his ECG today which shows sinus rhythm with diffuse ST-T wave abnormalities, largely clonic, although anteroseptal T wave inversions are more prominent.  This is in the absence of any active symptoms.  I reviewed his most recent lab work per Dr. [name], LDL 162 on Vytorin.  Patient states that he has been compliant with his medication.  Did not tolerate Crestor previously.  I did talk with him about other options such as PCSK9 inhibitors, also potentially referral to lipid clinic.  For now he preferred to hold off.  We discussed arranging follow-up ischemic testing.  Past Medical History:  Diagnosis Date  . Atrial flutter ([place])    Diagnosed by ECG August 2015  . Colitis   . COPD (chronic obstructive pulmonary disease) ([place])   . Coronary atherosclerosis of native coronary artery    Multivessel status post CABG  . Essential hypertension   . Hyperlipidemia   . Insomnia     Past Surgical History:  Procedure Laterality Date  . COLONOSCOPY WITH PROPOFOL N/A 08/30/2015   Procedure: COLONOSCOPY WITH PROPOFOL at cecum at 0814; withdrawal time=8 minutes;  Surgeon: Daneil Dolin, MD;  Location: [facility];  Service: Endoscopy;  Laterality: N/A;  . CORONARY ARTERY BYPASS GRAFT  2005   Dr. Lucianne Lei Trigt: LIMA to LAD, right radial to circumflex, SVG to RCA  . CORONARY ARTERY BYPASS GRAFT  10/16/2004  . ELECTROPHYSIOLOGIC STUDY N/A 11/21/2016   Procedure: A-Flutter Ablation;  Surgeon: Evans Lance, MD;  Location: [facility];  Service: Cardiovascular;  Laterality: N/A;  . TEE WITHOUT CARDIOVERSION N/A 11/21/2016   Procedure: TRANSESOPHAGEAL ECHOCARDIOGRAM (TEE);  Surgeon: Sanda Klein, MD;  Location: [facility];  Service: Cardiovascular;  Laterality: N/A;    Current Outpatient Medications  Medication Sig Dispense Refill  . albuterol (PROVENTIL) (2.5 MG/3ML) 0.083% nebulizer solution Take 3 mLs (2.5 mg total) by nebulization every 6 (six) hours as needed for wheezing or shortness of breath. 150 mL 3  . albuterol (VENTOLIN HFA) 108 (90 Base) MCG/ACT inhaler Inhale 2 puffs into the lungs every 4 (four) hours as needed for wheezing. 18 g 5  . ALPRAZolam (XANAX) 0.5 MG tablet Take one tablet up to BID prn anxiety 30 tablet 5  . aspirin EC 81 MG tablet Take 1 tablet (81 mg total) by mouth daily. 30 tablet 11  . budesonide-formoterol (SYMBICORT) 160-4.5 MCG/ACT inhaler TAKE 2 PUFFS FIRST THING IN MORNING AND THEN ANOTHER 2 PUFFS ABOUT 12 HOURS LATER. 30.6 Inhaler 1  . ezetimibe-simvastatin (VYTORIN) 10-40 MG tablet TAKE 1 TABLET BY MOUTH EVERYDAY AT BEDTIME 90 tablet 1  . fenofibrate 160 MG tablet Take  1 tablet (160 mg total) by mouth daily. 90 tablet 1  . fish oil-omega-3 fatty acids 1000 MG capsule Take 2 g by mouth daily.    Marland Kitchen ketoconazole (NIZORAL) 2 % cream Apply 1 application topically as needed for irritation.    Marland Kitchen losartan-hydrochlorothiazide (HYZAAR) 100-25 MG tablet Take 1 tablet by mouth daily. 90 tablet 1  . metoprolol tartrate (LOPRESSOR) 50 MG tablet Take 1 tablet (50 mg total) by mouth 2 (two) times daily. 180 tablet 1  . pantoprazole (PROTONIX) 40 MG  tablet Take 40 mg by mouth as needed.    . zolpidem (AMBIEN) 10 MG tablet Take 1 tablet (10 mg total) by mouth at bedtime as needed. for sleep 90 tablet 1  . nitroGLYCERIN (NITROSTAT) 0.4 MG SL tablet Place 1 tablet (0.4 mg total) under the tongue every 5 (five) minutes as needed. 25 tablet 3   No current facility-administered medications for this visit.   Allergies:  Ace inhibitors, Enalapril, and Clotrimazole-betamethasone   ROS:   No palpitations or syncope.  No claudication.  Physical Exam: VS:  BP 115/70   Pulse 63   Ht 6\' 1"  (1.854 m)   Wt 221 lb (100.2 kg)   SpO2 97%   BMI 29.16 kg/m , BMI Body mass index is 29.16 kg/m.  Wt Readings from Last 3 Encounters:  02/22/20 221 lb (100.2 kg)  02/21/20 221 lb (100.2 kg)  01/16/20 222 lb (100.7 kg)    General: Patient appears comfortable at rest. HEENT: Conjunctiva and lids normal, wearing a mask. Neck: Supple, no elevated JVP or carotid bruits, no thyromegaly. Lungs: Clear to auscultation, nonlabored breathing at rest. Cardiac: Regular rate and rhythm, no S3 or significant systolic murmur. Abdomen: Soft, nontender, bowel sounds present. Extremities: No pitting edema, distal pulses 2+. Skin: Warm and dry. Musculoskeletal: No kyphosis. Neuropsychiatric: Alert and oriented x3, affect grossly appropriate.  ECG:  An ECG dated 12/31/2016 was personally reviewed today and demonstrated:  Normal sinus rhythm with nonspecific ST-T changes.  Recent Labwork: 01/04/2020: ALT 15; AST 20; BUN 20; Creatinine, Ser 1.37; Hemoglobin 14.2; Platelets 147; Potassium 5.0; Sodium 140     Component Value Date/Time   CHOL 252 (H) 01/04/2020 1000   TRIG 278 (H) 01/04/2020 1000   HDL 38 (L) 01/04/2020 1000   CHOLHDL 6.6 (H) 01/04/2020 1000   CHOLHDL 9.4 (H) 01/11/2018 0833   VLDL 35 08/25/2014 0922   LDLCALC 162 (H) 01/04/2020 1000   LDLCALC  01/11/2018 0833     Comment:     . LDL cholesterol not calculated. Triglyceride levels greater than 400  mg/dL invalidate calculated LDL results. . Reference range: <100 . Desirable range <100 mg/dL for primary prevention;   <70 mg/dL for patients with CHD or diabetic patients  with > or = 2 CHD risk factors. Marland Kitchen LDL-C is now calculated using the Martin-Hopkins  calculation, which is a validated novel method providing  better accuracy than the Friedewald equation in the  estimation of LDL-C.  Cresenciano Genre et al. Annamaria Helling. [id number]): 2061-2068  (http://education.[employer].com/faq/FAQ164)     Other Studies Reviewed Today:  Echocardiogram 10/28/2016: Study Conclusions   - Left ventricle: The cavity size was normal. Wall thickness was  increased in a pattern of moderate LVH. Systolic function was  normal. The estimated ejection fraction was 55%. Wall motion was  normal; there were no regional wall motion abnormalities. The  study is not technically sufficient to allow evaluation of LV  diastolic function.  - Aortic valve: Mildly to  moderately calcified annulus. Trileaflet.  - Aortic root: The aortic root was mildly ectatic.  - Mitral valve: Calcified annulus.  - Left atrium: The atrium was mildly dilated.  - Right ventricle: The cavity size was mildly dilated. Systolic  function was mildly reduced.  - Right atrium: Central venous pressure (est): 3 mm Hg.  - Tricuspid valve: There was physiologic regurgitation.  - Pulmonary arteries: Systolic pressure could not be accurately  estimated.  - Pericardium, extracardiac: There was no pericardial effusion.   Impressions:   - Moderate LVH with LVEF approximately 55%. Indeterminate diastolic  function in the setting of atrial flutter. Mild left atrial  enlargement. Mildly calcified mitral annulus. Mildly ectatic  aortic root. Mild to moderate aortic annular calcification.  Mildly reduced right ventricular contraction.   Assessment and Plan:  1.  Multivessel CAD status post CABG in 2005.  He reports atypical  chest symptoms recently, ECG chronically abnormal.  Medical therapy includes aspirin, Hyzaar, Vytorin, and Lopressor.  Prescription provided for as needed use of nitroglycerin.  We will obtain a Lexiscan Myoview to assess ischemic burden.  2.  History of typical atrial flutter status post successful radiofrequency ablation by Dr. Lovena Le.  He reports no recurring palpitations and is not anticoagulated.  3.  Severe mixed hyperlipidemia.  He reports tolerance of and compliance with Vytorin.  Did not tolerate Crestor previously.  Recent LDL 162.  I did talk with him about considering PCSK9 inhibitor and referral to lipid clinic.  For now he wanted to hold off and consider this further.  4.  Essential hypertension, blood pressure is well controlled today.  Medication Adjustments/Labs and Tests Ordered: Current medicines are reviewed at length with the patient today.  Concerns regarding medicines are outlined above.   Tests Ordered: Orders Placed This Encounter  Procedures  . NM Myocar Multi W/Spect W/Wall Motion / EF  . EKG 12-Lead    Medication Changes: Meds ordered this encounter  Medications  . nitroGLYCERIN (NITROSTAT) 0.4 MG SL tablet    Sig: Place 1 tablet (0.4 mg total) under the tongue every 5 (five) minutes as needed.    Dispense:  25 tablet    Refill:  3    Disposition:  Follow up test results and determine next step.  Signed, Satira Sark, MD, [facility] 02/22/2020 9:23 AM    [facility]. [address], [place], [place] [address] Phone: [phone]; Fax: [phone]

## 2020-02-22 NOTE — Telephone Encounter (Signed)
Noted.  He did not mention the feeling in his ears at our visit today.  We could always obtain carotid Dopplers to make sure that he does not have any carotid stenosis.  As it relates to the plan for Myoview, we do not give him a medication to increase the heart rate (I did not order a dobutamine study).  Carlton Adam is a vasodilator.

## 2020-03-08 ENCOUNTER — Ambulatory Visit (HOSPITAL_COMMUNITY)
Admission: RE | Admit: 2020-03-08 | Discharge: 2020-03-08 | Disposition: A | Discharge status: Home / Self Care | Payer: Medicare Other | Source: Ambulatory Visit | Attending: Cardiology | Admitting: Cardiology

## 2020-03-08 ENCOUNTER — Ambulatory Visit (HOSPITAL_BASED_OUTPATIENT_CLINIC_OR_DEPARTMENT_OTHER)
Admission: RE | Admit: 2020-03-08 | Discharge: 2020-03-08 | Disposition: A | Discharge status: Home / Self Care | Payer: Medicare Other | Source: Ambulatory Visit | Attending: Cardiology | Admitting: Cardiology

## 2020-03-08 ENCOUNTER — Encounter (HOSPITAL_COMMUNITY): Admission: RE | Admit: 2020-03-08 | Payer: Medicare Other | Source: Ambulatory Visit

## 2020-03-08 ENCOUNTER — Telehealth: Payer: Self-pay

## 2020-03-08 ENCOUNTER — Other Ambulatory Visit: Payer: Self-pay

## 2020-03-08 DIAGNOSIS — I25119 Atherosclerotic heart disease of native coronary artery with unspecified angina pectoris: Secondary | ICD-10-CM | POA: Diagnosis not present

## 2020-03-08 DIAGNOSIS — I6523 Occlusion and stenosis of bilateral carotid arteries: Secondary | ICD-10-CM | POA: Diagnosis not present

## 2020-03-08 LAB — NM MYOCAR MULTI W/SPECT W/WALL MOTION / EF
LV dias vol: 140 mL (ref 62–150)
LV sys vol: 63 mL
Peak HR: 79 {beats}/min
RATE: 0.35
Rest HR: 61 {beats}/min
SDS: 0
SRS: 4
SSS: 4
TID: 1.22

## 2020-03-08 MED ORDER — REGADENOSON 0.4 MG/5ML IV SOLN
INTRAVENOUS | Status: AC
  Administered 2020-03-08: 12:00:00 0.4 mg via INTRAVENOUS
  Filled 2020-03-08: qty 5

## 2020-03-08 MED ORDER — SODIUM CHLORIDE FLUSH 0.9 % IV SOLN
INTRAVENOUS | Status: AC
  Administered 2020-03-08: 12:00:00 10 mL via INTRAVENOUS
  Filled 2020-03-08: qty 10

## 2020-03-08 MED ORDER — TECHNETIUM TC 99M TETROFOSMIN IV KIT
10.0000 | PACK | Freq: Once | INTRAVENOUS | Status: AC | PRN
  Administered 2020-03-08: 11:00:00 11 via INTRAVENOUS

## 2020-03-08 MED ORDER — TECHNETIUM TC 99M TETROFOSMIN IV KIT
30.0000 | PACK | Freq: Once | INTRAVENOUS | Status: AC | PRN
  Administered 2020-03-08: 12:00:00 32 via INTRAVENOUS

## 2020-03-08 NOTE — Telephone Encounter (Signed)
Per Gabriel Cirri ,[facility] Pt refused testing.  You may call Pt [phone]  Thanks renee

## 2020-03-08 NOTE — Telephone Encounter (Signed)
Pt would like test results   Please call [phone]   Thanks renee

## 2020-03-08 NOTE — Telephone Encounter (Signed)
Called pt. Wife answered as pt is not home yet. I asked her to have him call me.

## 2020-03-08 NOTE — Telephone Encounter (Signed)
Spoke with pt. He was unsure about having stress test (LEXISCAN) due to having issues previously. I explained the test to him, and he agrees to have it done. He agreed to come back today.

## 2020-03-08 NOTE — Telephone Encounter (Signed)
Attempt to reach, left message to call back-cc

## 2020-03-09 ENCOUNTER — Encounter (HOSPITAL_COMMUNITY): Payer: Medicare Other

## 2020-03-14 ENCOUNTER — Telehealth (INDEPENDENT_AMBULATORY_CARE_PROVIDER_SITE_OTHER): Payer: Medicare Other | Admitting: Family Medicine

## 2020-03-14 ENCOUNTER — Telehealth: Payer: Self-pay | Admitting: *Deleted

## 2020-03-14 ENCOUNTER — Other Ambulatory Visit: Payer: Self-pay

## 2020-03-14 DIAGNOSIS — J449 Chronic obstructive pulmonary disease, unspecified: Secondary | ICD-10-CM | POA: Diagnosis not present

## 2020-03-14 DIAGNOSIS — J441 Chronic obstructive pulmonary disease with (acute) exacerbation: Secondary | ICD-10-CM | POA: Diagnosis not present

## 2020-03-14 DIAGNOSIS — I25119 Atherosclerotic heart disease of native coronary artery with unspecified angina pectoris: Secondary | ICD-10-CM

## 2020-03-14 MED ORDER — AMOXICILLIN-POT CLAVULANATE 875-125 MG PO TABS: 1.0000 | ORAL_TABLET | Freq: Two times a day (BID) | ORAL | 0 refills | Status: DC

## 2020-03-14 NOTE — Progress Notes (Signed)
   Subjective:  Audio plus video  Patient ID: Dennis [name], male    DOB: [birth year], 69 y.o.   MRN: [id number]  Cough This is a recurrent problem. Episode onset: ongoing with his copd. Treatments tried: took amoxil last night and this morning.  pt states since having stress test last Thursday he has been feeling weak and having breathing issues. Using his neb machine and inhalers. Does feel some better. Walked half a mile yesterday.   Virtual Visit via Telephone Note  I connected with Dennis [name] on 03/14/20 at  3:50 PM EDT by telephone and verified that I am speaking with the correct person using two identifiers.  Location: Patient: home Provider: office   I discussed the limitations, risks, security and privacy concerns of performing an evaluation and management service by telephone and the availability of in person appointments. I also discussed with the patient that there may be a patient responsible charge related to this service. The patient expressed understanding and agreed to proceed.   History of Present Illness:    Observations/Objective:   Assessment and Plan:   Follow Up Instructions:    I discussed the assessment and treatment plan with the patient. The patient was provided an opportunity to ask questions and all were answered. The patient agreed with the plan and demonstrated an understanding of the instructions.   The patient was advised to call back or seek an in-person evaluation if the symptoms worsen or if the condition fails to improve as anticipated.  I provided 20 minutes of non-face-to-face time during this encounter.  Cough is productive.  No noticeable fever.  Definitely wheezing more.  Using the albuterol more.  More shortness of breath with exertion but felt good enough to take a walk earlier    Review of Systems  Respiratory: Positive for cough.        Objective:   Physical Exam  Virtual      Assessment & Plan:  Impression  exacerbation of COPD.  Discussed.  Will add antibiotic.  Proper use of albuterol and preventive inhaler discussed.  Symptom care discussed.  Warning signs discussed

## 2020-03-14 NOTE — Telephone Encounter (Signed)
[name], kernaghan are scheduled for a virtual visit with your provider today.    Just as we do with appointments in the office, we must obtain your consent to participate.  Your consent will be active for this visit and any virtual visit you may have with one of our providers in the next 365 days.    If you have a MyChart account, I can also send a copy of this consent to you electronically.  All virtual visits are billed to your insurance company just like a traditional visit in the office.  As this is a virtual visit, video technology does not allow for your provider to perform a traditional examination.  This may limit your provider's ability to fully assess your condition.  If your provider identifies any concerns that need to be evaluated in person or the need to arrange testing such as labs, EKG, etc, we will make arrangements to do so.    Although advances in technology are sophisticated, we cannot ensure that it will always work on either your end or our end.  If the connection with a video visit is poor, we may have to switch to a telephone visit.  With either a video or telephone visit, we are not always able to ensure that we have a secure connection.   I need to obtain your verbal consent now.   Are you willing to proceed with your visit today?   Dennis [name] has provided verbal consent on 03/14/2020 for a virtual visit (video or telephone).   [name], LPN [id number]  [id number] PM

## 2020-03-25 ENCOUNTER — Observation Stay (HOSPITAL_COMMUNITY)
Admission: EM | Admit: 2020-03-25 | Discharge: 2020-03-26 | Disposition: A | Discharge status: Home / Self Care | Payer: Medicare Other | Attending: Internal Medicine | Admitting: Internal Medicine

## 2020-03-25 ENCOUNTER — Emergency Department (HOSPITAL_COMMUNITY): Payer: Medicare Other

## 2020-03-25 ENCOUNTER — Encounter (HOSPITAL_COMMUNITY): Payer: Self-pay | Admitting: Emergency Medicine

## 2020-03-25 ENCOUNTER — Other Ambulatory Visit: Payer: Self-pay

## 2020-03-25 DIAGNOSIS — R0689 Other abnormalities of breathing: Secondary | ICD-10-CM | POA: Diagnosis not present

## 2020-03-25 DIAGNOSIS — Z951 Presence of aortocoronary bypass graft: Secondary | ICD-10-CM | POA: Insufficient documentation

## 2020-03-25 DIAGNOSIS — I483 Typical atrial flutter: Secondary | ICD-10-CM | POA: Insufficient documentation

## 2020-03-25 DIAGNOSIS — R069 Unspecified abnormalities of breathing: Secondary | ICD-10-CM | POA: Diagnosis not present

## 2020-03-25 DIAGNOSIS — Z79899 Other long term (current) drug therapy: Secondary | ICD-10-CM | POA: Diagnosis not present

## 2020-03-25 DIAGNOSIS — Z7982 Long term (current) use of aspirin: Secondary | ICD-10-CM | POA: Insufficient documentation

## 2020-03-25 DIAGNOSIS — J449 Chronic obstructive pulmonary disease, unspecified: Secondary | ICD-10-CM | POA: Diagnosis present

## 2020-03-25 DIAGNOSIS — R0602 Shortness of breath: Secondary | ICD-10-CM | POA: Diagnosis not present

## 2020-03-25 DIAGNOSIS — E785 Hyperlipidemia, unspecified: Secondary | ICD-10-CM | POA: Diagnosis present

## 2020-03-25 DIAGNOSIS — J441 Chronic obstructive pulmonary disease with (acute) exacerbation: Principal | ICD-10-CM | POA: Insufficient documentation

## 2020-03-25 DIAGNOSIS — Z20822 Contact with and (suspected) exposure to covid-19: Secondary | ICD-10-CM | POA: Diagnosis not present

## 2020-03-25 DIAGNOSIS — I10 Essential (primary) hypertension: Secondary | ICD-10-CM | POA: Diagnosis present

## 2020-03-25 DIAGNOSIS — I2510 Atherosclerotic heart disease of native coronary artery without angina pectoris: Secondary | ICD-10-CM | POA: Diagnosis not present

## 2020-03-25 DIAGNOSIS — R0902 Hypoxemia: Secondary | ICD-10-CM | POA: Diagnosis not present

## 2020-03-25 DIAGNOSIS — Z87891 Personal history of nicotine dependence: Secondary | ICD-10-CM | POA: Diagnosis not present

## 2020-03-25 LAB — CBC WITH DIFFERENTIAL/PLATELET
Abs Immature Granulocytes: 0.04 10*3/uL (ref 0.00–0.07)
Basophils Absolute: 0 10*3/uL (ref 0.0–0.1)
Basophils Relative: 0 %
Eosinophils Absolute: 0.1 10*3/uL (ref 0.0–0.5)
Eosinophils Relative: 1 %
HCT: 46.4 % (ref 39.0–52.0)
Hemoglobin: 14.6 g/dL (ref 13.0–17.0)
Immature Granulocytes: 0 %
Lymphocytes Relative: 7 %
Lymphs Abs: 0.9 10*3/uL (ref 0.7–4.0)
MCH: 30 pg (ref 26.0–34.0)
MCHC: 31.5 g/dL (ref 30.0–36.0)
MCV: 95.3 fL (ref 80.0–100.0)
Monocytes Absolute: 0.6 10*3/uL (ref 0.1–1.0)
Monocytes Relative: 5 %
Neutro Abs: 10.6 10*3/uL — ABNORMAL HIGH (ref 1.7–7.7)
Neutrophils Relative %: 87 %
Platelets: 149 10*3/uL — ABNORMAL LOW (ref 150–400)
RBC: 4.87 MIL/uL (ref 4.22–5.81)
RDW: 12.5 % (ref 11.5–15.5)
WBC: 12.2 10*3/uL — ABNORMAL HIGH (ref 4.0–10.5)
nRBC: 0 % (ref 0.0–0.2)

## 2020-03-25 LAB — COMPREHENSIVE METABOLIC PANEL
ALT: 20 U/L (ref 0–44)
AST: 27 U/L (ref 15–41)
Albumin: 3.9 g/dL (ref 3.5–5.0)
Alkaline Phosphatase: 23 U/L — ABNORMAL LOW (ref 38–126)
Anion gap: 6 (ref 5–15)
BUN: 15 mg/dL (ref 8–23)
CO2: 29 mmol/L (ref 22–32)
Calcium: 8.9 mg/dL (ref 8.9–10.3)
Chloride: 101 mmol/L (ref 98–111)
Creatinine, Ser: 1.15 mg/dL (ref 0.61–1.24)
GFR calc Af Amer: >60 mL/min (ref >60)
GFR calc non Af Amer: >60 mL/min (ref >60)
Glucose, Bld: 123 mg/dL — ABNORMAL HIGH (ref 70–99)
Potassium: 3.8 mmol/L (ref 3.5–5.1)
Sodium: 136 mmol/L (ref 135–145)
Total Bilirubin: 0.6 mg/dL (ref 0.3–1.2)
Total Protein: 6.7 g/dL (ref 6.5–8.1)

## 2020-03-25 LAB — RESPIRATORY PANEL BY RT PCR (FLU A&B, COVID)
Influenza A by PCR: NEGATIVE
Influenza B by PCR: NEGATIVE
SARS Coronavirus 2 by RT PCR: NEGATIVE

## 2020-03-25 LAB — TROPONIN I (HIGH SENSITIVITY)
Troponin I (High Sensitivity): 6 ng/L (ref <18)
Troponin I (High Sensitivity): 6 ng/L (ref <18)

## 2020-03-25 LAB — D-DIMER, QUANTITATIVE: D-Dimer, Quant: 0.41 ug/mL-FEU (ref 0.00–0.50)

## 2020-03-25 MED ORDER — ACETAMINOPHEN 325 MG PO TABS: 650.0000 mg | ORAL_TABLET | Freq: Four times a day (QID) | ORAL | Status: DC | PRN

## 2020-03-25 MED ORDER — ENOXAPARIN SODIUM 40 MG/0.4ML ~~LOC~~ SOLN
40.0000 mg | Freq: Every day | SUBCUTANEOUS | Status: DC
  Administered 2020-03-26: 40 mg via SUBCUTANEOUS
  Filled 2020-03-25 (×2): qty 0.4

## 2020-03-25 MED ORDER — IPRATROPIUM-ALBUTEROL 0.5-2.5 (3) MG/3ML IN SOLN
3.0000 mL | Freq: Once | RESPIRATORY_TRACT | Status: AC
  Administered 2020-03-25: 3 mL via RESPIRATORY_TRACT
  Filled 2020-03-25: qty 3

## 2020-03-25 MED ORDER — METOPROLOL TARTRATE 50 MG PO TABS
50.0000 mg | ORAL_TABLET | Freq: Two times a day (BID) | ORAL | Status: DC
  Administered 2020-03-25: 50 mg via ORAL
  Administered 2020-03-26: 25 mg via ORAL
  Filled 2020-03-25: qty 1
  Filled 2020-03-25: qty 2

## 2020-03-25 MED ORDER — ZOLPIDEM TARTRATE 5 MG PO TABS
5.0000 mg | ORAL_TABLET | Freq: Every evening | ORAL | Status: DC | PRN
  Administered 2020-03-25: 5 mg via ORAL
  Filled 2020-03-25: qty 1

## 2020-03-25 MED ORDER — HYDROCHLOROTHIAZIDE 25 MG PO TABS
25.0000 mg | ORAL_TABLET | Freq: Every day | ORAL | Status: DC
  Administered 2020-03-26: 25 mg via ORAL
  Filled 2020-03-25: qty 1

## 2020-03-25 MED ORDER — ONDANSETRON HCL 4 MG/2ML IJ SOLN: 4.0000 mg | Freq: Four times a day (QID) | INTRAMUSCULAR | Status: DC | PRN

## 2020-03-25 MED ORDER — POLYETHYLENE GLYCOL 3350 17 G PO PACK: 17.0000 g | PACK | Freq: Every day | ORAL | Status: DC | PRN

## 2020-03-25 MED ORDER — GUAIFENESIN-DM 100-10 MG/5ML PO SYRP
5.0000 mL | ORAL_SOLUTION | Freq: Three times a day (TID) | ORAL | Status: DC
  Administered 2020-03-25: 5 mL via ORAL
  Filled 2020-03-25 (×2): qty 5

## 2020-03-25 MED ORDER — FENOFIBRATE 160 MG PO TABS
160.0000 mg | ORAL_TABLET | Freq: Every day | ORAL | Status: DC
  Administered 2020-03-26: 160 mg via ORAL
  Filled 2020-03-25: qty 1

## 2020-03-25 MED ORDER — ALBUTEROL SULFATE (2.5 MG/3ML) 0.083% IN NEBU
INHALATION_SOLUTION | RESPIRATORY_TRACT | Status: AC
  Administered 2020-03-25: 22:00:00 2.5 mg
  Filled 2020-03-25: qty 6

## 2020-03-25 MED ORDER — ALPRAZOLAM 0.5 MG PO TABS: 0.5000 mg | ORAL_TABLET | Freq: Two times a day (BID) | ORAL | Status: DC | PRN

## 2020-03-25 MED ORDER — ONDANSETRON HCL 4 MG PO TABS: 4.0000 mg | ORAL_TABLET | Freq: Four times a day (QID) | ORAL | Status: DC | PRN

## 2020-03-25 MED ORDER — MOMETASONE FURO-FORMOTEROL FUM 200-5 MCG/ACT IN AERO: 2.0000 | INHALATION_SPRAY | Freq: Two times a day (BID) | RESPIRATORY_TRACT | Status: DC

## 2020-03-25 MED ORDER — METHYLPREDNISOLONE SODIUM SUCC 125 MG IJ SOLR
60.0000 mg | Freq: Two times a day (BID) | INTRAMUSCULAR | Status: DC
  Administered 2020-03-26: 60 mg via INTRAVENOUS
  Filled 2020-03-25: qty 2

## 2020-03-25 MED ORDER — LOSARTAN POTASSIUM-HCTZ 100-25 MG PO TABS: 1.0000 | ORAL_TABLET | Freq: Every day | ORAL | Status: DC

## 2020-03-25 MED ORDER — EZETIMIBE-SIMVASTATIN 10-40 MG PO TABS
1.0000 | ORAL_TABLET | Freq: Every day | ORAL | Status: DC
  Filled 2020-03-25 (×2): qty 1

## 2020-03-25 MED ORDER — IPRATROPIUM-ALBUTEROL 0.5-2.5 (3) MG/3ML IN SOLN: 3.0000 mL | RESPIRATORY_TRACT | Status: DC | PRN

## 2020-03-25 MED ORDER — IPRATROPIUM-ALBUTEROL 0.5-2.5 (3) MG/3ML IN SOLN
3.0000 mL | Freq: Four times a day (QID) | RESPIRATORY_TRACT | Status: DC
  Administered 2020-03-26 (×2): 3 mL via RESPIRATORY_TRACT
  Filled 2020-03-25 (×2): qty 3

## 2020-03-25 MED ORDER — SODIUM CHLORIDE 0.9% FLUSH
3.0000 mL | Freq: Two times a day (BID) | INTRAVENOUS | Status: DC
  Administered 2020-03-25 – 2020-03-26 (×2): 3 mL via INTRAVENOUS

## 2020-03-25 MED ORDER — SODIUM CHLORIDE 0.9 % IV SOLN
500.0000 mg | INTRAVENOUS | Status: DC
  Administered 2020-03-25: 500 mg via INTRAVENOUS
  Filled 2020-03-25: qty 500

## 2020-03-25 MED ORDER — ASPIRIN EC 81 MG PO TBEC
81.0000 mg | DELAYED_RELEASE_TABLET | Freq: Every day | ORAL | Status: DC
  Administered 2020-03-26: 81 mg via ORAL
  Filled 2020-03-25: qty 1

## 2020-03-25 MED ORDER — ACETAMINOPHEN 650 MG RE SUPP: 650.0000 mg | Freq: Four times a day (QID) | RECTAL | Status: DC | PRN

## 2020-03-25 MED ORDER — LOSARTAN POTASSIUM 50 MG PO TABS
100.0000 mg | ORAL_TABLET | Freq: Every day | ORAL | Status: DC
  Administered 2020-03-26: 100 mg via ORAL
  Filled 2020-03-25: qty 2

## 2020-03-25 NOTE — ED Triage Notes (Signed)
Ems called out for shortness of breath, pt at 85% on r/a upon ems arrival, o2 titrated to 6 liters 97-98%.  Given 125 solu medrol

## 2020-03-25 NOTE — ED Provider Notes (Signed)
[facility] EMERGENCY DEPARTMENT Provider Note   CSN: [id number] Arrival date & time: 03/25/20  1832     History Chief Complaint  Patient presents with  . Shortness of Breath    Dennis [name] is a 69 y.o. male with past medical history significant for HTN, HLD, CAD s/p CABG, atrial flutter, and COPD who presents to the ED via EMS for shortness of breath.  I obtained history from EMS reports that patient was 85% on RA upon their arrival and he was immediately placed on 6 L O2 via [place] and given 125 mg Solu-Medrol IV.  On my examination, patient reports that he felt particularly short of breath after waking up from a afternoon nap.  He had obtained a pulse oximeter roughly 1 week ago and when he checked his oxygenation it was 79%.  He states that he had recently been treated for a COPD exacerbation with antibiotics by his primary care provider.  He uses albuterol and nebulizer treatments at home, as needed.  He does not use any steroid inhalers as he read that they will increase his risk of respiratory infection.  Patient reports that he has never required supplemental oxygen at home, but states that his shortness of breath symptoms limit his activities of daily living significantly and he rarely is able to go for walks with his wife.  HPI     Past Medical History:  Diagnosis Date  . Atrial flutter ([place])    Diagnosed by ECG August 2015  . Colitis   . COPD (chronic obstructive pulmonary disease) ([place])   . Coronary atherosclerosis of native coronary artery    Multivessel status post CABG  . Essential hypertension   . Hyperlipidemia   . Insomnia     Patient Active Problem List   Diagnosis Date Noted  . PTSD (post-traumatic stress disorder) 11/10/2017  . Atrial flutter ([place]) 11/21/2016  . COPD GOLD III with reversibility  10/01/2015  . Hematochezia   . Diverticulosis of colon without hemorrhage   . Hemorrhoid   . History of adenomatous polyp of colon 08/14/2015  . Rectal bleeding  08/14/2015  . Upper airway cough syndrome 08/13/2015  . Dyspnea 08/13/2015  . Impaired fasting glucose 02/26/2015  . Chronic left hip pain 09/17/2014  . Typical atrial flutter ([place]) 07/17/2014  . Hyperlipidemia LDL goal <100 04/08/2010  . Essential hypertension, benign 04/08/2010  . Coronary atherosclerosis of native coronary artery 04/08/2010  . COPD 04/08/2010    Past Surgical History:  Procedure Laterality Date  . COLONOSCOPY WITH PROPOFOL N/A 08/30/2015   Procedure: COLONOSCOPY WITH PROPOFOL at cecum at 0814; withdrawal time=8 minutes;  Surgeon: Daneil Dolin, MD;  Location: [facility];  Service: Endoscopy;  Laterality: N/A;  . CORONARY ARTERY BYPASS GRAFT  2005   Dr. Lucianne Lei Trigt: LIMA to LAD, right radial to circumflex, SVG to RCA  . CORONARY ARTERY BYPASS GRAFT  10/16/2004  . ELECTROPHYSIOLOGIC STUDY N/A 11/21/2016   Procedure: A-Flutter Ablation;  Surgeon: Evans Lance, MD;  Location: [place] CV LAB;  Service: Cardiovascular;  Laterality: N/A;  . TEE WITHOUT CARDIOVERSION N/A 11/21/2016   Procedure: TRANSESOPHAGEAL ECHOCARDIOGRAM (TEE);  Surgeon: Sanda Klein, MD;  Location: [facility] ENDOSCOPY;  Service: Cardiovascular;  Laterality: N/A;       Family History  Problem Relation Age of Onset  . Hypertension Mother   . Alcohol abuse Mother   . Alcohol abuse Father   . Colon cancer Neg Hx     Social History  Tobacco Use  . Smoking status: Former Smoker    Packs/day: 2.00    Years: 27.00    Pack years: 54.00    Types: Cigarettes    Start date: 02/01/1971    Quit date: 01/31/1998    Years since quitting: 22.1  . Smokeless tobacco: Current User    Types: Chew  Substance Use Topics  . Alcohol use: Yes    Alcohol/week: 0.0 standard drinks    Comment: Drinks beer daily, typically 2-8 a day. Also 4 oz red wine an evening.  . Drug use: No    Home Medications Prior to Admission medications   Medication Sig Start Date End Date Taking? Authorizing Provider  albuterol  (PROVENTIL) (2.5 MG/3ML) 0.083% nebulizer solution Take 3 mLs (2.5 mg total) by nebulization every 6 (six) hours as needed for wheezing or shortness of breath. 09/28/19  Yes Mikey Kirschner, MD  albuterol (VENTOLIN HFA) 108 (90 Base) MCG/ACT inhaler Inhale 2 puffs into the lungs every 4 (four) hours as needed for wheezing. 06/09/19  Yes Mikey Kirschner, MD  ALPRAZolam Duanne Moron) 0.5 MG tablet Take one tablet up to BID prn anxiety 12/13/19  Yes Mikey Kirschner, MD  aspirin EC 81 MG tablet Take 1 tablet (81 mg total) by mouth daily. 12/31/16  Yes Evans Lance, MD  budesonide-formoterol (SYMBICORT) 160-4.5 MCG/ACT inhaler TAKE 2 PUFFS FIRST THING IN MORNING AND THEN ANOTHER 2 PUFFS ABOUT 12 HOURS LATER. 01/16/20  Yes Mikey Kirschner, MD  ezetimibe-simvastatin (VYTORIN) 10-40 MG tablet TAKE 1 TABLET BY MOUTH EVERYDAY AT BEDTIME 01/16/20  Yes Mikey Kirschner, MD  fenofibrate 160 MG tablet Take 1 tablet (160 mg total) by mouth daily. 01/16/20  Yes Mikey Kirschner, MD  fish oil-omega-3 fatty acids 1000 MG capsule Take 2 g by mouth daily.   Yes [provider]  ketoconazole (NIZORAL) 2 % cream Apply 1 application topically as needed for irritation.   Yes [provider]  losartan-hydrochlorothiazide (HYZAAR) 100-25 MG tablet Take 1 tablet by mouth daily. 01/16/20  Yes Mikey Kirschner, MD  metoprolol tartrate (LOPRESSOR) 50 MG tablet Take 1 tablet (50 mg total) by mouth 2 (two) times daily. 01/16/20  Yes Mikey Kirschner, MD  nitroGLYCERIN (NITROSTAT) 0.4 MG SL tablet Place 1 tablet (0.4 mg total) under the tongue every 5 (five) minutes as needed. 02/22/20  Yes Satira Sark, MD  pantoprazole (PROTONIX) 40 MG tablet Take 40 mg by mouth as needed.   Yes [provider]  zolpidem (AMBIEN) 10 MG tablet Take 1 tablet (10 mg total) by mouth at bedtime as needed. for sleep 01/25/20  Yes Mikey Kirschner, MD    Allergies    Ace inhibitors, Enalapril, Clotrimazole-betamethasone,  and Prednisone  Review of Systems   Review of Systems  All other systems reviewed and are negative.   Physical Exam Updated Vital Signs BP (!) 144/71   Pulse 79   Temp 98.4 F (36.9 C) (Oral)   Resp 20   Ht 6\' 1"  (1.854 m)   Wt 101.6 kg   SpO2 95%   BMI 29.55 kg/m   Physical Exam Vitals and nursing note reviewed. Exam conducted with a chaperone present.  Constitutional:      Appearance: Normal appearance.  HENT:     Head: Normocephalic and atraumatic.  Eyes:     General: No scleral icterus.    Conjunctiva/sclera: Conjunctivae normal.  Cardiovascular:     Rate and Rhythm: Normal rate and regular  rhythm.     Pulses: Normal pulses.     Heart sounds: Normal heart sounds.  Pulmonary:     Comments: No significant increased work of breathing.  Chest rises symmetrically.  No abnormal breath sounds.  Borderline tachypneic. Musculoskeletal:     Cervical back: Normal range of motion. No rigidity.  Skin:    General: Skin is dry.     Capillary Refill: Capillary refill takes less than 2 seconds.  Neurological:     Mental Status: He is alert and oriented to person, place, and time.     GCS: GCS eye subscore is 4. GCS verbal subscore is 5. GCS motor subscore is 6.  Psychiatric:        Mood and Affect: Mood normal.        Behavior: Behavior normal.        Thought Content: Thought content normal.     ED Results / Procedures / Treatments   Labs (all labs ordered are listed, but only abnormal results are displayed) Labs Reviewed  CBC WITH DIFFERENTIAL/PLATELET - Abnormal; Notable for the following components:      Result Value   WBC 12.2 (*)    Platelets 149 (*)    Neutro Abs 10.6 (*)    All other components within normal limits  COMPREHENSIVE METABOLIC PANEL - Abnormal; Notable for the following components:   Glucose, Bld 123 (*)    Alkaline Phosphatase 23 (*)    All other components within normal limits  RESPIRATORY PANEL BY RT PCR (FLU A&B, COVID)  D-DIMER,  QUANTITATIVE (NOT AT [facility])  TROPONIN I (HIGH SENSITIVITY)  TROPONIN I (HIGH SENSITIVITY)    EKG EKG Interpretation  Date/Time:  Sunday Mar 25 2020 18:49:50 EDT Ventricular Rate:  84 PR Interval:    QRS Duration: 118 QT Interval:  371 QTC Calculation: 439 R Axis:   89 Text Interpretation: Sinus rhythm Nonspecific intraventricular conduction delay Minimal ST depression, anterior leads Baseline wander in lead(s) V6 Since last tracing now in Sinus rhythm Confirmed by Calvert Cantor [phone]) on 03/25/2020 6:52:18 PM   Radiology DG Chest Portable 1 View  Result Date: 03/25/2020 CLINICAL DATA:  Shortness of breath. 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 The lungs are hyperinflated. There is diffuse coarsening of the interstitium bilaterally. Bandlike opacity in the right mid lung favored to represent atelectasis or scarring. Otherwise lungs are clear. No pneumothorax or significant pleural effusion. No acute finding in the visualized skeleton. IMPRESSION: 1. Bandlike opacity in the right mid lung favored to represent atelectasis or scarring. No other acute finding. 2.  COPD.  Likely chronic bronchitic change. Electronically Signed   By: Audie Pinto M.D.   On: 03/25/2020 20:02    Procedures .Critical Care Performed by: Corena Herter, PA-C Authorized by: Corena Herter, PA-C   Critical care provider statement:    Critical care time (minutes):  45   Critical care was necessary to treat or prevent imminent or life-threatening deterioration of the following conditions:  Respiratory failure   Critical care was time spent personally by me on the following activities:  Discussions with consultants, evaluation of patient's response to treatment, examination of patient, ordering and performing treatments and interventions, ordering and review of laboratory studies, ordering and  review of radiographic studies, pulse oximetry, re-evaluation of patient's condition, obtaining history from patient or surrogate and review of old charts   (including critical care time)  Medications  Ordered in ED Medications - No data to display  ED Course  I have reviewed the triage vital signs and the nursing notes.  Pertinent labs & imaging results that were available during my care of the patient were reviewed by me and considered in my medical decision making (see chart for details).  Clinical Course as of Mar 25 2100  Nancy Fetter Mar 25, 2020  2100 Spoke with Dr. Denton Brick who will evaluate and admit patient for new onset hypoxia and oxygen requirement, likely in setting of COPD exacerbation.   [GG]    Clinical Course User Index [GG] Corena Herter, PA-C   MDM Rules/Calculators/A&P                      While initial vitals suggest 91% SPO2, that was while he was on supplemental oxygen via nasal cannula.  I temporarily dropped him to 0 L for observation as he reported feeling significantly improved after receiving 125 mg Solu-Medrol in route to the hospital.  However, he dipped to 89% so I placed him back on 2 L supplemental O2.    Plan is to admit patient to the hospitalist services for new onset oxygen requirement and hypoxia, likely in the setting of a COPD exacerbation.    Spoke with Dr. Denton Brick who will evaluate and admit patient for new onset hypoxia and oxygen requirement, likely in setting of COPD exacerbation.  Final Clinical Impression(s) / ED Diagnoses Final diagnoses:  Hypoxia    Rx / DC Orders ED Discharge Orders    None       Reita Chard 03/25/20 2102    Truddie Hidden, MD 03/25/20 2208

## 2020-03-25 NOTE — ED Notes (Signed)
Increased O2 to 4L O2 via , pt had desatted after some exertion.

## 2020-03-25 NOTE — H&P (Signed)
History and Physical    [name] [id number] DOB: 1951-08-24 DOA: 03/25/2020  PCP: Mikey Kirschner, MD   Patient coming from: Home  I have personally briefly reviewed patient's old medical records in [place]  Chief Complaint: low oxygen levels  HPI: Dennis [name] is a 69 y.o. male with medical history significant for COPD, CABG, atrial flutter, hypertension.  Patient presented to the ED via EMS with reports of low oxygen level.  Was evaluated about 2 weeks ago by his primary care provider and started on treatment for COPD exacerbation, treatment included Augmentin and prednisone.  Was prescribed about a 10-day course of Augmentin, but he took this for 3 days and stopped when he started feeling better.  Prednisone for about 3 days also, but stopped as he developed a rash, which he usually does, as an allergic reaction to prednisone. The rash usually developed on his thighs, legs and feet, is raised and itchy, Benadryl helps.  Today he woke up with difficulty breathing, markedly worse with activity, reports his COPD exacerbation has never been this severe.  Reports worsening cough productive of whitish sputum, and wheezing.  He recently obtained a pulse oximeter about a week ago and his O2 sats was 79%.  On EMS arrival patient's O2 sats were 85% on room air, he was placed on 6 L nasal cannula, 125 mg of Solu-Medrol was given.  ED course- O2 sats initially 90% on 2 L nasal cannula, on room air dropped to 89%, currently on 4 L nasal cannula.  WBC 12.  D-dimer  WNL 0.4.  Troponin 6.  Respiratory virus panel negative for COVID-19 and influenza.  Portable chest x-ray shows bandlike opacity-atelectasis versus scarring favored.  Hospitalist to admit for acute respiratory failure.    Review of Systems: As per HPI all other systems reviewed and negative.  Past Medical History:  Diagnosis Date  . Atrial flutter ([place])    Diagnosed by ECG August 2015  . Colitis   . COPD (chronic  obstructive pulmonary disease) ([place])   . Coronary atherosclerosis of native coronary artery    Multivessel status post CABG  . Essential hypertension   . Hyperlipidemia   . Insomnia     Past Surgical History:  Procedure Laterality Date  . COLONOSCOPY WITH PROPOFOL N/A 08/30/2015   Procedure: COLONOSCOPY WITH PROPOFOL at cecum at 0814; withdrawal time=8 minutes;  Surgeon: Daneil Dolin, MD;  Location: [facility];  Service: Endoscopy;  Laterality: N/A;  . CORONARY ARTERY BYPASS GRAFT  2005   Dr. Lucianne Lei Trigt: LIMA to LAD, right radial to circumflex, SVG to RCA  . CORONARY ARTERY BYPASS GRAFT  10/16/2004  . ELECTROPHYSIOLOGIC STUDY N/A 11/21/2016   Procedure: A-Flutter Ablation;  Surgeon: Evans Lance, MD;  Location: [place] CV LAB;  Service: Cardiovascular;  Laterality: N/A;  . TEE WITHOUT CARDIOVERSION N/A 11/21/2016   Procedure: TRANSESOPHAGEAL ECHOCARDIOGRAM (TEE);  Surgeon: Sanda Klein, MD;  Location: [facility] ENDOSCOPY;  Service: Cardiovascular;  Laterality: N/A;     reports that he quit smoking about 22 years ago. His smoking use included cigarettes. He started smoking about 49 years ago. He has a 54.00 pack-year smoking history. His smokeless tobacco use includes chew. He reports current alcohol use. He reports that he does not use drugs.  Allergies  Allergen Reactions  . Ace Inhibitors Shortness Of Breath and Cough  . Enalapril Cough    Pt has been switched to Valsartan and is tolerating the different class.  Marland Kitchen  Clotrimazole-Betamethasone Rash  . Prednisone Rash    Family History  Problem Relation Age of Onset  . Hypertension Mother   . Alcohol abuse Mother   . Alcohol abuse Father   . Colon cancer Neg Hx     Prior to Admission medications   Medication Sig Start Date End Date Taking? Authorizing Provider  albuterol (PROVENTIL) (2.5 MG/3ML) 0.083% nebulizer solution Take 3 mLs (2.5 mg total) by nebulization every 6 (six) hours as needed for wheezing or shortness of breath.  09/28/19  Yes Mikey Kirschner, MD  albuterol (VENTOLIN HFA) 108 (90 Base) MCG/ACT inhaler Inhale 2 puffs into the lungs every 4 (four) hours as needed for wheezing. 06/09/19  Yes Mikey Kirschner, MD  ALPRAZolam Duanne Moron) 0.5 MG tablet Take one tablet up to BID prn anxiety 12/13/19  Yes Mikey Kirschner, MD  aspirin EC 81 MG tablet Take 1 tablet (81 mg total) by mouth daily. 12/31/16  Yes Evans Lance, MD  budesonide-formoterol (SYMBICORT) 160-4.5 MCG/ACT inhaler TAKE 2 PUFFS FIRST THING IN MORNING AND THEN ANOTHER 2 PUFFS ABOUT 12 HOURS LATER. 01/16/20  Yes Mikey Kirschner, MD  ezetimibe-simvastatin (VYTORIN) 10-40 MG tablet TAKE 1 TABLET BY MOUTH EVERYDAY AT BEDTIME 01/16/20  Yes Mikey Kirschner, MD  fenofibrate 160 MG tablet Take 1 tablet (160 mg total) by mouth daily. 01/16/20  Yes Mikey Kirschner, MD  fish oil-omega-3 fatty acids 1000 MG capsule Take 2 g by mouth daily.   Yes [provider]  ketoconazole (NIZORAL) 2 % cream Apply 1 application topically as needed for irritation.   Yes [provider]  losartan-hydrochlorothiazide (HYZAAR) 100-25 MG tablet Take 1 tablet by mouth daily. 01/16/20  Yes Mikey Kirschner, MD  metoprolol tartrate (LOPRESSOR) 50 MG tablet Take 1 tablet (50 mg total) by mouth 2 (two) times daily. 01/16/20  Yes Mikey Kirschner, MD  nitroGLYCERIN (NITROSTAT) 0.4 MG SL tablet Place 1 tablet (0.4 mg total) under the tongue every 5 (five) minutes as needed. 02/22/20  Yes Satira Sark, MD  pantoprazole (PROTONIX) 40 MG tablet Take 40 mg by mouth as needed.   Yes [provider]  zolpidem (AMBIEN) 10 MG tablet Take 1 tablet (10 mg total) by mouth at bedtime as needed. for sleep 01/25/20  Yes Mikey Kirschner, MD    Physical Exam: Vitals:   03/25/20 1930 03/25/20 2000 03/25/20 2030 03/25/20 2045  BP: (!) 148/65 134/66 (!) 144/71   Pulse: 83 83 79   Resp: 19 20 20 20   Temp:      TempSrc:      SpO2: 91% 92% 92% 95%  Weight:        Height:        Constitutional: calm, comfortable,  Vitals:   03/25/20 1930 03/25/20 2000 03/25/20 2030 03/25/20 2045  BP: (!) 148/65 134/66 (!) 144/71   Pulse: 83 83 79   Resp: 19 20 20 20   Temp:      TempSrc:      SpO2: 91% 92% 92% 95%  Weight:      Height:       Eyes: PERRL, lids and conjunctivae normal ENMT: Mucous membranes are moist.  Neck: normal, supple, no masses, no thyromegaly Respiratory: Wheezing heard anteriorly, posteriorly Marked reduced air entry bilaterally, sounds tight, no crackles. . when making long senetences, he becomes winded, with mild accessory muscle use. Cardiovascular: Regular rate and rhythm, heart sounds faint, unable to auscultate heart sounds, no extremity edema. 2+  pedal pulses.   Abdomen: Soft, no tenderness, No hepatosplenomegaly.  Musculoskeletal: no clubbing / cyanosis. No joint deformity upper and lower extremities. Good ROM, no contractures. Normal muscle tone.  Skin: no rashes, lesions, ulcers. No induration Neurologic: No apparent cranial nerve abnormality, moving all extremities spontaneously Psychiatric: Normal judgment and insight. Alert and oriented x 3. Normal mood.   Labs on Admission: I have personally reviewed following labs and imaging studies  CBC: Recent Labs  Lab 03/25/20 1929  WBC 12.2*  NEUTROABS 10.6*  HGB 14.6  HCT 46.4  MCV 95.3  PLT 123456*   Basic Metabolic Panel: Recent Labs  Lab 03/25/20 1929  NA 136  K 3.8  CL 101  CO2 29  GLUCOSE 123*  BUN 15  CREATININE 1.15  CALCIUM 8.9   Liver Function Tests: Recent Labs  Lab 03/25/20 1929  AST 27  ALT 20  ALKPHOS 23*  BILITOT 0.6  PROT 6.7  ALBUMIN 3.9    Radiological Exams on Admission: DG Chest Portable 1 View  Result Date: 03/25/2020 CLINICAL DATA:  Shortness of breath. 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 The lungs are hyperinflated.  There is diffuse coarsening of the interstitium bilaterally. Bandlike opacity in the right mid lung favored to represent atelectasis or scarring. Otherwise lungs are clear. No pneumothorax or significant pleural effusion. No acute finding in the visualized skeleton. IMPRESSION: 1. Bandlike opacity in the right mid lung favored to represent atelectasis or scarring. No other acute finding. 2.  COPD.  Likely chronic bronchitic change. Electronically Signed   By: Audie Pinto M.D.   On: 03/25/2020 20:02    EKG: Independently reviewed.  Sinus rhythm rate 84, QTc 439. There are changes compared to last EKG, which heart ST abnormalities,, but compared to prior EKGs there are no significant abnormalities.  Assessment/Plan Principal Problem:   COPD exacerbation (HCC) Active Problems:   Hyperlipidemia LDL goal <100   Essential hypertension, benign   Typical atrial flutter (H[facility])   COPD GOLD III with reversibility    COPD GOLD III exacerbation with acute hypoxic respiratory failure-wheezing, reduced air entry, productive cough, worsening dyspnea, O2 sats down to 85% per EMS, 89% on room air in the ED.  Currently on 4 L sats greater than 95%.  Not on home O2.  Chest x-ray atelectasis versus scarring.  WBC 12.  D-dimer within normal limits. -IV azithromycin -Mucolytics scheduled -DuoNeb scheduled and as needed -Resume Symbicort inhaler - IV Solu-Medrol 125 mg given, tolerated without rash, continue 60 mg every 12 hourly. -Flutter valve -Resume Xanax 0.5 mg every 12 hourly as needed -Will need to assess if home O2 needed on discharge  History of atrial flutter- s/p successful radiofrequency ablation.  Not on anticoagulation.  Follows with Dr. Conni Elliot.  Currently in sinus rhythm. -Continue home aspirin  History of CABG 2005- troponin unremarkable - 6 x 2.  No chest pain.  EKG without significant ST or T wave abnormalities, but different from last EKG which was abnormal. -Continue home Vytorin,  fenofibrate  HTN-stable. -Home metoprolol, losartan HCTZ  DVT prophylaxis: Lovenox Code Status: Full code Family Communication: Spouse At bedside  disposition Plan:  1 - 2 days, pending improvement in respiratory status, Consults called: None Admission status: Observation, telemetry   Bethena Roys MD [facility]  03/25/2020, 9:53 PM

## 2020-03-26 DIAGNOSIS — I10 Essential (primary) hypertension: Secondary | ICD-10-CM | POA: Diagnosis not present

## 2020-03-26 DIAGNOSIS — E785 Hyperlipidemia, unspecified: Secondary | ICD-10-CM

## 2020-03-26 DIAGNOSIS — J441 Chronic obstructive pulmonary disease with (acute) exacerbation: Secondary | ICD-10-CM | POA: Diagnosis not present

## 2020-03-26 LAB — GLUCOSE, CAPILLARY: Glucose-Capillary: 177 mg/dL — ABNORMAL HIGH (ref 70–99)

## 2020-03-26 LAB — HIV ANTIBODY (ROUTINE TESTING W REFLEX): HIV Screen 4th Generation wRfx: NONREACTIVE

## 2020-03-26 MED ORDER — PREDNISONE 20 MG PO TABS
ORAL_TABLET | ORAL | 0 refills | Status: DC
Start: 2020-03-26 — End: 2020-07-23

## 2020-03-26 MED ORDER — AMOXICILLIN-POT CLAVULANATE 875-125 MG PO TABS: 1.0000 | ORAL_TABLET | Freq: Two times a day (BID) | ORAL | 0 refills | Status: AC

## 2020-03-26 MED ORDER — SIMVASTATIN 20 MG PO TABS: 40.0000 mg | ORAL_TABLET | Freq: Every day | ORAL | Status: DC

## 2020-03-26 MED ORDER — DIPHENHYDRAMINE HCL 50 MG/ML IJ SOLN
25.0000 mg | Freq: Four times a day (QID) | INTRAMUSCULAR | Status: DC | PRN
  Administered 2020-03-26: 25 mg via INTRAVENOUS
  Filled 2020-03-26: qty 1

## 2020-03-26 MED ORDER — MUCINEX 600 MG PO TB12
600.0000 mg | ORAL_TABLET | Freq: Two times a day (BID) | ORAL | 2 refills | Status: DC
Start: 2020-03-26 — End: 2020-07-23

## 2020-03-26 MED ORDER — EZETIMIBE 10 MG PO TABS: 10.0000 mg | ORAL_TABLET | Freq: Every day | ORAL | Status: DC

## 2020-03-26 NOTE — Discharge Summary (Signed)
Physician Discharge Summary  Dennis [name] [id number] DOB: May 25, 1951 DOA: 03/25/2020  PCP: Mikey Kirschner, MD  Admit date: 03/25/2020 Discharge date: 03/26/2020  Admitted From: Home Disposition: Home  Recommendations for Outpatient Follow-up:  1. Follow up with PCP in 1-2 weeks 2. Please obtain BMP/CBC in one week  Home Health: Equipment/Devices: Oxygen at 2 L/min  Discharge Condition: Stable CODE STATUS: Full code Diet recommendation: Heart healthy  Brief/Interim Summary: 69 year old male with a history of COPD, admitted to the hospital with shortness of breath.  He was found to have a COPD exacerbation.  He was admitted for intravenous steroids, bronchodilators and antibiotics.  Chest x-ray did not show any clear source of pneumonia.  He was treated with ceftriaxone and azithromycin.  Subsequently transitioned to a course of Augmentin.  Steroids were transitioned to prednisone taper.  The patient felt significantly improved the next day and wanted to discharge home.  His wheezing had resolved.  Upon ambulation, he did desaturate and qualified for home oxygen.  This was arranged prior to discharge.  The patient feels significantly improved and felt ready to go home.  Discharge Diagnoses:  Principal Problem:   COPD exacerbation ([place]) Active Problems:   Hyperlipidemia LDL goal <100   Essential hypertension, benign   Typical atrial flutter ([place])   COPD GOLD III with reversibility     Discharge Instructions  Discharge Instructions    Diet - low sodium heart healthy   Complete by: As directed    Increase activity slowly   Complete by: As directed      Allergies as of 03/26/2020      Reactions   Ace Inhibitors Shortness Of Breath, Cough   Enalapril Cough   Pt has been switched to Valsartan and is tolerating the different class.   Clotrimazole-betamethasone Rash   Prednisone Rash      Medication List    TAKE these medications   albuterol 108 (90 Base) MCG/ACT  inhaler Commonly known as: VENTOLIN HFA Inhale 2 puffs into the lungs every 4 (four) hours as needed for wheezing.   albuterol (2.5 MG/3ML) 0.083% nebulizer solution Commonly known as: PROVENTIL Take 3 mLs (2.5 mg total) by nebulization every 6 (six) hours as needed for wheezing or shortness of breath.   ALPRAZolam 0.5 MG tablet Commonly known as: XANAX Take one tablet up to BID prn anxiety   amoxicillin-clavulanate 875-125 MG tablet Commonly known as: Augmentin Take 1 tablet by mouth 2 (two) times daily for 5 days.   aspirin EC 81 MG tablet Take 1 tablet (81 mg total) by mouth daily.   budesonide-formoterol 160-4.5 MCG/ACT inhaler Commonly known as: Symbicort TAKE 2 PUFFS FIRST THING IN MORNING AND THEN ANOTHER 2 PUFFS ABOUT 12 HOURS LATER.   ezetimibe-simvastatin 10-40 MG tablet Commonly known as: VYTORIN TAKE 1 TABLET BY MOUTH EVERYDAY AT BEDTIME   fenofibrate 160 MG tablet Take 1 tablet (160 mg total) by mouth daily.   fish oil-omega-3 fatty acids 1000 MG capsule Take 2 g by mouth daily.   ketoconazole 2 % cream Commonly known as: NIZORAL Apply 1 application topically as needed for irritation.   losartan-hydrochlorothiazide 100-25 MG tablet Commonly known as: HYZAAR Take 1 tablet by mouth daily.   metoprolol tartrate 50 MG tablet Commonly known as: LOPRESSOR Take 1 tablet (50 mg total) by mouth 2 (two) times daily.   Mucinex 600 MG 12 hr tablet Generic drug: guaiFENesin Take 1 tablet (600 mg total) by mouth 2 (two) times daily.   nitroGLYCERIN  0.4 MG SL tablet Commonly known as: NITROSTAT Place 1 tablet (0.4 mg total) under the tongue every 5 (five) minutes as needed.   pantoprazole 40 MG tablet Commonly known as: PROTONIX Take 40 mg by mouth as needed.   predniSONE 20 MG tablet Commonly known as: DELTASONE Take 3 tabs po daily for 3 days then 2 tabs po daily for 3 days then 1 tab po daily for 3 days   zolpidem 10 MG tablet Commonly known as:  AMBIEN Take 1 tablet (10 mg total) by mouth at bedtime as needed. for sleep      Follow-up Information    [facility] Follow up.   Why:   oxygen         Allergies  Allergen Reactions  . Ace Inhibitors Shortness Of Breath and Cough  . Enalapril Cough    Pt has been switched to Valsartan and is tolerating the different class.  . Clotrimazole-Betamethasone Rash  . Prednisone Rash    Consultations:     Procedures/Studies: US Carotid Duplex Bilateral  Result Date: 03/08/2020 CLINICAL DATA:  69 year old male with a history of coronary artery disease EXAM: BILATERAL CAROTID DUPLEX ULTRASOUND TECHNIQUE: Pearline Cables scale imaging, color Doppler and duplex ultrasound were performed of bilateral carotid and vertebral arteries in the neck. COMPARISON:  None. FINDINGS: Criteria: Quantification of carotid stenosis is based on velocity parameters that correlate the residual internal carotid diameter with NASCET-based stenosis levels, using the diameter of the distal internal carotid lumen as the denominator for stenosis measurement. The following velocity measurements were obtained: RIGHT ICA:  Systolic A999333 cm/sec, Diastolic 40 cm/sec CCA:  76 cm/sec SYSTOLIC ICA/CCA RATIO:  1.7 ECA:  205 cm/sec LEFT ICA:  Systolic 123XX123 cm/sec, Diastolic 37 cm/sec CCA:  63 cm/sec SYSTOLIC ICA/CCA RATIO:  1.6 ECA:  113 cm/sec Right Brachial SBP: Not acquired Left Brachial SBP: Not acquired RIGHT CAROTID ARTERY: No significant calcifications of the right common carotid artery. Intermediate waveform maintained. Moderate heterogeneous and partially calcified plaque at the right carotid bifurcation. Lumen shadowing. Low resistance waveform of the right ICA. Some tortuosity. RIGHT VERTEBRAL ARTERY: Antegrade flow with low resistance waveform. LEFT CAROTID ARTERY: No significant calcifications of the left common carotid artery. Intermediate waveform maintained. Moderate heterogeneous and partially calcified plaque at the left  carotid bifurcation. Lumen shadowing. Low resistance waveform of the left ICA. Some tortuosity. LEFT VERTEBRAL ARTERY:  Antegrade flow with low resistance waveform. IMPRESSION: Color duplex indicates moderate heterogeneous and calcified plaque, with no hemodynamically significant stenosis by duplex criteria in the extracranial cerebrovascular circulation. Signed, Dulcy Fanny. Dellia Nims, RPVI [facility] Radiology Electronically Signed   By: Corrie Mckusick D.O.   On: 03/08/2020 10:28   NM Myocar Multi W/Spect W/Wall Motion / EF  Result Date: 03/08/2020  No diagnostic ST segment changes over baseline to indicate ischemia.  Small size, mild intensity, and predominantly apical inferior defect that is most consistent with soft tissue attenuation rather than scar in light of normal wall motion. No large ischemic territories noted.  This is a low risk study.  Nuclear stress EF: 55%.    DG Chest Portable 1 View  Result Date: 03/25/2020 CLINICAL DATA:  Shortness of breath. 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 The lungs are hyperinflated. There is diffuse coarsening of the interstitium bilaterally. Bandlike opacity in the right mid lung favored to represent atelectasis or scarring. Otherwise lungs are clear.  No pneumothorax or significant pleural effusion. No acute finding in the visualized skeleton. IMPRESSION: 1. Bandlike opacity in the right mid lung favored to represent atelectasis or scarring. No other acute finding. 2.  COPD.  Likely chronic bronchitic change. Electronically Signed   By: Audie Pinto M.D.   On: 03/25/2020 20:02       Subjective: Feels significantly improved.  He expectorated a significant amount of sputum and overall shortness of breath is better.  No wheezing at this time.  Discharge Exam: Vitals:   03/26/20 0735  03/26/20 0751 03/26/20 1200 03/26/20 1210  BP:  121/67 (!) 87/58 (!) 111/51  Pulse:  64 (!) 58   Resp:  18 18   Temp:      TempSrc:      SpO2: 96% 97% 92%   Weight:      Height:        General: Pt is alert, awake, not in acute distress Cardiovascular: RRR, S1/S2 +, no rubs, no gallops Respiratory: CTA bilaterally, no wheezing, no rhonchi Abdominal: Soft, NT, ND, bowel sounds + Extremities: no edema, no cyanosis    The results of significant diagnostics from this hospitalization (including imaging, microbiology, ancillary and laboratory) are listed below for reference.     Microbiology: Recent Results (from the past 240 hour(s))  Respiratory Panel by RT PCR (Flu A&B, Covid) - Nasopharyngeal Swab     Status: None   Collection Time: 03/25/20  7:39 PM   Specimen: Nasopharyngeal Swab  Result Value Ref Range Status   SARS Coronavirus 2 by RT PCR NEGATIVE NEGATIVE Final    Comment: (NOTE) SARS-CoV-2 target nucleic acids are NOT DETECTED. The SARS-CoV-2 RNA is generally detectable in upper respiratoy specimens during the acute phase of infection. The lowest concentration of SARS-CoV-2 viral copies this assay can detect is 131 copies/mL. A negative result does not preclude SARS-Cov-2 infection and should not be used as the sole basis for treatment or other patient management decisions. A negative result may occur with  improper specimen collection/handling, submission of specimen other than nasopharyngeal swab, presence of viral mutation(s) within the areas targeted by this assay, and inadequate number of viral copies (<131 copies/mL). A negative result must be combined with clinical observations, patient history, and epidemiological information. The expected result is Negative. Fact Sheet for Patients:  PinkCheek.be Fact Sheet for Healthcare Providers:  GravelBags.it This test is not yet ap proved or cleared by the [place] FDA and  has been authorized for detection and/or diagnosis of SARS-CoV-2 by FDA under an Emergency Use Authorization (EUA). This EUA will remain  in effect (meaning this test can be used) for the duration of the COVID-19 declaration under Section 564(b)(1) of the Act, 21 U.S.C. section 360bbb-3(b)(1), unless the authorization is terminated or revoked sooner.    Influenza A by PCR NEGATIVE NEGATIVE Final   Influenza B by PCR NEGATIVE NEGATIVE Final    Comment: (NOTE) The Xpert Xpress SARS-CoV-2/FLU/RSV assay is intended as an aid in  the diagnosis of influenza from Nasopharyngeal swab specimens and  should not be used as a sole basis for treatment. Nasal washings and  aspirates are unacceptable for Xpert Xpress SARS-CoV-2/FLU/RSV  testing. Fact Sheet for Patients: PinkCheek.be Fact Sheet for Healthcare Providers: GravelBags.it This test is not yet approved or cleared by the [place] FDA and  has been authorized for detection and/or diagnosis of SARS-CoV-2 by  FDA under an Emergency Use Authorization (EUA). This EUA will remain  in effect (meaning this  test can be used) for the duration of the  Covid-19 declaration under Section 564(b)(1) of the Act, 21  U.S.C. section 360bbb-3(b)(1), unless the authorization is  terminated or revoked. Performed at [facility], [address], [place], [place] [address]      Labs: BNP (last 3 results) No results for input(s): BNP in the last 8760 hours. Basic Metabolic Panel: Recent Labs  Lab 03/25/20 1929  NA 136  K 3.8  CL 101  CO2 29  GLUCOSE 123*  BUN 15  CREATININE 1.15  CALCIUM 8.9   Liver Function Tests: Recent Labs  Lab 03/25/20 1929  AST 27  ALT 20  ALKPHOS 23*  BILITOT 0.6  PROT 6.7  ALBUMIN 3.9   No results for input(s): LIPASE, AMYLASE in the last 168 hours. No results for input(s): AMMONIA in the last 168 hours. CBC: Recent Labs  Lab  03/25/20 1929  WBC 12.2*  NEUTROABS 10.6*  HGB 14.6  HCT 46.4  MCV 95.3  PLT 149*   Cardiac Enzymes: No results for input(s): CKTOTAL, CKMB, CKMBINDEX, TROPONINI in the last 168 hours. BNP: Invalid input(s): POCBNP CBG: Recent Labs  Lab 03/26/20 0134  GLUCAP 177*   D-Dimer Recent Labs    03/25/20 1929  DDIMER 0.41   Hgb A1c No results for input(s): HGBA1C in the last 72 hours. Lipid Profile No results for input(s): CHOL, HDL, LDLCALC, TRIG, CHOLHDL, LDLDIRECT in the last 72 hours. Thyroid function studies No results for input(s): TSH, T4TOTAL, T3FREE, THYROIDAB in the last 72 hours.  Invalid input(s): FREET3 Anemia work up No results for input(s): VITAMINB12, FOLATE, FERRITIN, TIBC, IRON, RETICCTPCT in the last 72 hours. Urinalysis No results found for: COLORURINE, APPEARANCEUR, [place], [place], [place], [place], [place], [place], PROTEINUR, UROBILINOGEN, NITRITE, LEUKOCYTESUR Sepsis Labs Invalid input(s): PROCALCITONIN,  WBC,  LACTICIDVEN Microbiology Recent Results (from the past 240 hour(s))  Respiratory Panel by RT PCR (Flu A&B, Covid) - Nasopharyngeal Swab     Status: None   Collection Time: 03/25/20  7:39 PM   Specimen: Nasopharyngeal Swab  Result Value Ref Range Status   SARS Coronavirus 2 by RT PCR NEGATIVE NEGATIVE Final    Comment: (NOTE) SARS-CoV-2 target nucleic acids are NOT DETECTED. The SARS-CoV-2 RNA is generally detectable in upper respiratoy specimens during the acute phase of infection. The lowest concentration of SARS-CoV-2 viral copies this assay can detect is 131 copies/mL. A negative result does not preclude SARS-Cov-2 infection and should not be used as the sole basis for treatment or other patient management decisions. A negative result may occur with  improper specimen collection/handling, submission of specimen other than nasopharyngeal swab, presence of viral mutation(s) within the areas targeted by this assay, and inadequate  number of viral copies (<131 copies/mL). A negative result must be combined with clinical observations, patient history, and epidemiological information. The expected result is Negative. Fact Sheet for Patients:  PinkCheek.be Fact Sheet for Healthcare Providers:  GravelBags.it This test is not yet ap proved or cleared by the [place] FDA and  has been authorized for detection and/or diagnosis of SARS-CoV-2 by FDA under an Emergency Use Authorization (EUA). This EUA will remain  in effect (meaning this test can be used) for the duration of the COVID-19 declaration under Section 564(b)(1) of the Act, 21 U.S.C. section 360bbb-3(b)(1), unless the authorization is terminated or revoked sooner.    Influenza A by PCR NEGATIVE NEGATIVE Final   Influenza B by PCR NEGATIVE NEGATIVE Final    Comment: (NOTE) The Xpert  Xpress SARS-CoV-2/FLU/RSV assay is intended as an aid in  the diagnosis of influenza from Nasopharyngeal swab specimens and  should not be used as a sole basis for treatment. Nasal washings and  aspirates are unacceptable for Xpert Xpress SARS-CoV-2/FLU/RSV  testing. Fact Sheet for Patients: PinkCheek.be Fact Sheet for Healthcare Providers: GravelBags.it This test is not yet approved or cleared by the [place] FDA and  has been authorized for detection and/or diagnosis of SARS-CoV-2 by  FDA under an Emergency Use Authorization (EUA). This EUA will remain  in effect (meaning this test can be used) for the duration of the  Covid-19 declaration under Section 564(b)(1) of the Act, 21  U.S.C. section 360bbb-3(b)(1), unless the authorization is  terminated or revoked. Performed at [facility], [address] [place], [place] [address]      Time coordinating discharge: 59mins  SIGNED:   Kathie Dike, MD  [facility] 03/26/2020, 8:57 PM   If  7PM-7AM, please contact night-coverage [email or web address]

## 2020-03-26 NOTE — Progress Notes (Signed)
Nsg Discharge Note  Admit Date:  03/25/2020 Discharge date: 03/26/2020   [name] to be D/C'd home per MD order.  AVS completed.  Copy for chart, and copy for patient signed, and dated. Patient/caregiver able to verbalize understanding.  Discharge Medication: Allergies as of 03/26/2020      Reactions   Ace Inhibitors Shortness Of Breath, Cough   Enalapril Cough   Pt has been switched to Valsartan and is tolerating the different class.   Clotrimazole-betamethasone Rash   Prednisone Rash      Medication List    TAKE these medications   albuterol 108 (90 Base) MCG/ACT inhaler Commonly known as: VENTOLIN HFA Inhale 2 puffs into the lungs every 4 (four) hours as needed for wheezing.   albuterol (2.5 MG/3ML) 0.083% nebulizer solution Commonly known as: PROVENTIL Take 3 mLs (2.5 mg total) by nebulization every 6 (six) hours as needed for wheezing or shortness of breath.   ALPRAZolam 0.5 MG tablet Commonly known as: XANAX Take one tablet up to BID prn anxiety   amoxicillin-clavulanate 875-125 MG tablet Commonly known as: Augmentin Take 1 tablet by mouth 2 (two) times daily for 5 days.   aspirin EC 81 MG tablet Take 1 tablet (81 mg total) by mouth daily.   budesonide-formoterol 160-4.5 MCG/ACT inhaler Commonly known as: Symbicort TAKE 2 PUFFS FIRST THING IN MORNING AND THEN ANOTHER 2 PUFFS ABOUT 12 HOURS LATER.   ezetimibe-simvastatin 10-40 MG tablet Commonly known as: VYTORIN TAKE 1 TABLET BY MOUTH EVERYDAY AT BEDTIME   fenofibrate 160 MG tablet Take 1 tablet (160 mg total) by mouth daily.   fish oil-omega-3 fatty acids 1000 MG capsule Take 2 g by mouth daily.   ketoconazole 2 % cream Commonly known as: NIZORAL Apply 1 application topically as needed for irritation.   losartan-hydrochlorothiazide 100-25 MG tablet Commonly known as: HYZAAR Take 1 tablet by mouth daily.   metoprolol tartrate 50 MG tablet Commonly known as: LOPRESSOR Take 1 tablet (50 mg total)  by mouth 2 (two) times daily.   Mucinex 600 MG 12 hr tablet Generic drug: guaiFENesin Take 1 tablet (600 mg total) by mouth 2 (two) times daily.   nitroGLYCERIN 0.4 MG SL tablet Commonly known as: NITROSTAT Place 1 tablet (0.4 mg total) under the tongue every 5 (five) minutes as needed.   pantoprazole 40 MG tablet Commonly known as: PROTONIX Take 40 mg by mouth as needed.   predniSONE 20 MG tablet Commonly known as: DELTASONE Take 3 tabs po daily for 3 days then 2 tabs po daily for 3 days then 1 tab po daily for 3 days   zolpidem 10 MG tablet Commonly known as: AMBIEN Take 1 tablet (10 mg total) by mouth at bedtime as needed. for sleep            Durable Medical Equipment  (From admission, onward)         Start     Ordered   03/26/20 1153  For home use only DME oxygen  Once    Question Answer Comment  Length of Need 6 Months   Mode or (Route) Nasal cannula   Liters per Minute 2   Frequency Continuous (stationary and portable oxygen unit needed)   Oxygen conserving device Yes   Oxygen delivery system Gas      03/26/20 1152          Discharge Assessment: Vitals:   03/26/20 1200 03/26/20 1210  BP: (!) 87/58 (!) 111/51  Pulse: (!) 58  Resp: 18   Temp:    SpO2: 92%    Skin clean, dry and intact without evidence of skin break down, no evidence of skin tears noted. IV catheter discontinued intact. Site without signs and symptoms of complications - no redness or edema noted at insertion site, patient denies c/o pain - only slight tenderness at site.  Dressing with slight pressure applied.  D/c Instructions-Education: Discharge instructions given to patient/family with verbalized understanding. D/c education completed with patient/family including follow up instructions, medication list, d/c activities limitations if indicated, with other d/c instructions as indicated by MD - patient able to verbalize understanding, all questions fully answered. Patient  instructed to return to ED, call 911, or call MD for any changes in condition.  Patient escorted via [place], and [place] home via private auto.  Zachery Conch, RN 03/26/2020 2:19 PM

## 2020-03-26 NOTE — Progress Notes (Signed)
SATURATION QUALIFICATIONS: (This note is used to comply with regulatory documentation for home oxygen)  Patient Saturations on Room Air at Rest =  94%  Patient Saturations on Room Air while Ambulating = 88%  Patient Saturations on 2 Liters of oxygen while Ambulating = 96%  Please briefly explain why patient needs home oxygen:  Patient becomes slightly short of breath while ambulating on RA   O2 saturation dropped to 88% while walking on RA  O2 saturation on 2L [place] while ambulating was 96%

## 2020-03-26 NOTE — TOC Transition Note (Signed)
Transition of Care [facility]) - CM/SW Discharge Note   Patient Details  Name: Dennis [name] MRN: [id number] Date of Birth: [birth year]  Transition of Care [facility]) CM/SW Contact:  Boneta Lucks, RN Phone Number: 03/26/2020, 11:46 AM   Clinical Narrative:   Patient admitted with COPD, discharging home today with need for oxygen.  Choices given. Juliann Pulse with [facility] accepted the referral.     Final next level of care: Home/Self Care Barriers to Discharge: Barriers Resolved   Patient Goals and CMS Choice Patient states their goals for this hospitalization and ongoing recovery are:: to go home. CMS Medicare.gov Compare Post Acute Care list provided to:: Patient Choice offered to / list presented to : Patient  Discharge Placement        Patient and family notified of of transfer: 03/26/20  Discharge Plan and Services                DME Arranged: 3-N-1, Oxygen DME Agency: [facility] Date DME Agency Contacted: 03/26/20 Time DME Agency Contacted: 52 Representative spoke with at DME Agency: Blake Divine

## 2020-04-16 ENCOUNTER — Telehealth: Payer: Self-pay | Admitting: Family Medicine

## 2020-04-16 ENCOUNTER — Telehealth: Payer: Self-pay

## 2020-04-16 NOTE — Telephone Encounter (Signed)
Pt called to report that he has been concerned about his BP readings. He says Dr. [name] typically manges it but he wanted to ask Dr. Domenic Polite if he thinks the BP changes has anything to do with his heart.   He has put in a call to Dr. [name] for his med management.   Bp has been 155/74, 161/84 both first thing in the morning pre meds.   After his meds 131/65, 143/76.  Last night after working in the yard and drinking 3-4 beers his BP was 121/56 and 104/54.  He says his has been happening for the last few weeks so he stopped his Losartan/ HCTZ 100-25 for 4 days then restarted it but only taking 1/2.   I have advised him to take some more"regular" Bp readings and he says he checks it 4-5 times a day. I asked that he does not check it som often but once in the morning after taking his meds and then once at night and to write them down with the times noted.   I also asked him to hydrate well but beer does not count and may alter his BP as well.   I will; forward to Dr. Domenic Polite per his request but if Dr. [name] has been manging his BP then it is best to just have one MD making changes.   Pt agreed and says he just wants to know if his heart worsening would be making his BP fluctuate.

## 2020-04-16 NOTE — Telephone Encounter (Signed)
Patient is having blood pressure issues and wanting to discuss with you. Last night it was 104/52 at 9:30 and 121/56 at 10:00. Today it was 161/84 -7:00am heart-65, 145/59 at 1:45 and 149/72 at 2:00. He is taking half pill for blood pressure. He states his lung doctor put him on symbicort 160-4.5mg   , 2 puffs in the morning and 2 puffs at night for his COPD and wondering if its affecting his blood pressure. Please advise.

## 2020-04-16 NOTE — Telephone Encounter (Signed)
Pt called and he has already spoke with Dr. [name]. He is going to monitor his BP regularly for a few more days then is going to call him back ion Thursday,. He will let us know what med changes if any will be made at that time.

## 2020-04-16 NOTE — Telephone Encounter (Signed)
Serevent should have no effect on blood pressure. The challenge with what eddie is saying is that yesterday the numbers were on the lowish side ,and today they are on the highish side. Obviously it is hard for me to respond with raising or lowering medicine in that situation. Ck twice daily, record, and call us Thursday morning with the numbers

## 2020-04-16 NOTE — Telephone Encounter (Signed)
Pt has concerns with BP readings  Please call [phone]   Thanks renee

## 2020-04-16 NOTE — Telephone Encounter (Signed)
I am glad that he has reached out to Dr. [name] regarding his blood pressure, it sounds like he may need further medication adjustments.  Although changes in cardiac status can affect blood pressure, his recent testing was reassuring. Myoview in April was overall low risk in terms of follow-up of his cardiac status.

## 2020-04-16 NOTE — Telephone Encounter (Signed)
Patient advised per Dr Richardson Landry: Symbicort should not have effect on blood pressure. The challenge with what Patient is saying is that yesterday the numbers were on the lowish side ,and today they are on the highish side. Obviously it is hard to respond with raising or lowering medicine in that situation. Check twice daily, record, and call us Thursday morning with the numbers. Patient verbalized understanding.

## 2020-04-19 ENCOUNTER — Telehealth: Payer: Self-pay | Admitting: Family Medicine

## 2020-04-19 NOTE — Telephone Encounter (Signed)
I think over5all these blood pressures are quite good. I would back off to only once per day measuring going forward . If the top number drops below 110 and he feels lightheaded with it, and it stays at that level for several days, he can back off to half a table. Bp is all about range of numbers , there is wide variation from measurement to measurement in all of [place]. As long as top number stays mostly below140 and bottom number mostly below 85, that will be very good

## 2020-04-19 NOTE — Telephone Encounter (Signed)
Patient notified of Dr. Jeannine Kitten recommendation and verbalized understanding.

## 2020-04-19 NOTE — Telephone Encounter (Signed)
Patient was told on Monday to called and give blood pressure readings. He is taking a whole pill now instead of half one. He wants to know how low should his BP get before he needs to get concern and what does he need to watch for and what can he do when it gets low low.Here are his readings from  5/25-150/83-7 am          133/85-11 am          133/72-2 pm           144/82-8 pm 5/26- 129/77-8 am           120/60- 11 am           124/74- 5 pm           119/67- 8 pm This was his reading on 5/23 -101/52 pm, 104/52,121/56

## 2020-05-30 DIAGNOSIS — S50811A Abrasion of right forearm, initial encounter: Secondary | ICD-10-CM | POA: Diagnosis not present

## 2020-05-30 DIAGNOSIS — W5321XA Bitten by squirrel, initial encounter: Secondary | ICD-10-CM | POA: Diagnosis not present

## 2020-05-30 DIAGNOSIS — S51831A Puncture wound without foreign body of right forearm, initial encounter: Secondary | ICD-10-CM | POA: Diagnosis not present

## 2020-05-30 DIAGNOSIS — Z23 Encounter for immunization: Secondary | ICD-10-CM | POA: Diagnosis not present

## 2020-06-13 ENCOUNTER — Telehealth: Payer: Self-pay | Admitting: Family Medicine

## 2020-06-13 NOTE — Telephone Encounter (Signed)
Patient needs to schedule appointment with labs prior. Please send back to nurses once scheduled and I will order labs.

## 2020-06-13 NOTE — Telephone Encounter (Signed)
Patient is requesting phone visit doesn't not want to come to office for medication follow up because of his severe COPD.He was requesting refill on xanax. Please advise

## 2020-06-13 NOTE — Telephone Encounter (Signed)
Patient is requesting refill on xanxa 0.5 mg last seen 02/21/2020. [facility]

## 2020-06-13 NOTE — Telephone Encounter (Signed)
I don't do video or phone visits for controlled substances.  Plus this pt is on 2 controlled meds over 67 yrs old [place] and xanax and pt needs an appt to follow up on this bc likely will be tapered off these meds due to safety concerns  with this combination of meds in pt over 65 yrs old.  Thx,   Dr. Lovena Le

## 2020-06-13 NOTE — Telephone Encounter (Signed)
I apologize I read the message wrong. Patient is requesting a PHONE visit. Please advise.   Patient is requesting phone visit doesn't not want to come to office for medication follow up because of his severe COPD.He was requesting refill on xanax. Please advise

## 2020-06-13 NOTE — Telephone Encounter (Signed)
Dr. Lovena Le will not be doing home visits and he will require office visit.

## 2020-06-14 ENCOUNTER — Telehealth: Payer: Self-pay | Admitting: Family Medicine

## 2020-06-14 MED ORDER — ALPRAZOLAM 0.5 MG PO TABS: ORAL_TABLET | ORAL | 0 refills | Status: DC

## 2020-06-14 NOTE — Telephone Encounter (Signed)
Pt called made med follow up appt for 8/30 he is wanting to know when he needs his lab work done as well

## 2020-06-14 NOTE — Telephone Encounter (Signed)
Last labs 01/04/20 lipid, liver, bmp, psa, cbc

## 2020-06-14 NOTE — Telephone Encounter (Signed)
Yes, pls order labs, ill look at the refill. Dr. Lovena Le

## 2020-06-15 NOTE — Telephone Encounter (Signed)
Patient was made aware of this yesterday when he was called.

## 2020-07-23 ENCOUNTER — Encounter: Payer: Self-pay | Admitting: Family Medicine

## 2020-07-23 ENCOUNTER — Other Ambulatory Visit: Payer: Self-pay

## 2020-07-23 ENCOUNTER — Ambulatory Visit (INDEPENDENT_AMBULATORY_CARE_PROVIDER_SITE_OTHER): Payer: Medicare Other | Admitting: Family Medicine

## 2020-07-23 VITALS — BP 134/80 | HR 73 | Temp 97.6°F | Ht 73.0 in | Wt 224.0 lb

## 2020-07-23 DIAGNOSIS — E785 Hyperlipidemia, unspecified: Secondary | ICD-10-CM | POA: Diagnosis not present

## 2020-07-23 DIAGNOSIS — G4700 Insomnia, unspecified: Secondary | ICD-10-CM | POA: Diagnosis not present

## 2020-07-23 DIAGNOSIS — F419 Anxiety disorder, unspecified: Secondary | ICD-10-CM

## 2020-07-23 MED ORDER — ZOLPIDEM TARTRATE 5 MG PO TABS
5.0000 mg | ORAL_TABLET | Freq: Every evening | ORAL | 2 refills | Status: DC | PRN
Start: 2020-07-23 — End: 2020-09-17

## 2020-07-23 NOTE — Patient Instructions (Signed)
  Taper off the xanax, only take 1 tab as needed every other day or less.

## 2020-07-23 NOTE — Progress Notes (Signed)
Patient ID: Dennis [name], male    DOB: [birth year], 69 y.o.   MRN: [id number]   Chief Complaint  Patient presents with  . Anxiety  . Insomnia   Subjective:    HPI  F/u insomnia/anxiety.  Pt needs refill on ambien.   Seeing pulm in past and has cards doctor. Seeing derm in past.   Pt seen for refills on sleep medications.  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 Seeing cards in past. Has very high cholesterol total was 268, 276, now at 252.  LDL- was 177, 175, 162.   Pt was on as needed xanax 0.5 taking it 1-2x per week. 02/21/13- on ambien $RemoveB'10mg'GmdrZoWT$  nightly. Due to working nights/days.   Feb 22, 2016- wife passed away and they brought her back, due to "overdosing." Started taking xanax due to the stress/anxiety.  Pt stated he saw psychiatry in past for PTSD and stating he didn't want to be on a medication that would make him a "zombie."  He was given medications and didn't take them.  Pt just wanting to stay on the ambien.   Pt willing to discontinue the xanax.  Only taking it 1-2x per week, for stress or "optical migraines or silent migraines"   Medical History Teja has a past medical history of Atrial flutter ([place]), Colitis, COPD (chronic obstructive pulmonary disease) ([place]), Coronary atherosclerosis of native coronary artery, Essential hypertension, Hyperlipidemia, and Insomnia.   Outpatient Encounter Medications as of 07/23/2020  Medication Sig  . albuterol (PROVENTIL) (2.5 MG/3ML) 0.083% nebulizer solution Take 3 mLs (2.5 mg total) by nebulization every 6 (six) hours as needed for wheezing or shortness of breath.  Marland Kitchen albuterol (VENTOLIN HFA) 108 (90 Base) MCG/ACT inhaler Inhale 2 puffs into the lungs every 4 (four) hours as needed for wheezing.  Marland Kitchen aspirin EC 81 MG tablet Take 1 tablet (81 mg total) by mouth daily.  . budesonide-formoterol (SYMBICORT) 160-4.5 MCG/ACT inhaler TAKE 2 PUFFS FIRST THING IN MORNING AND THEN ANOTHER 2 PUFFS ABOUT 12 HOURS LATER.  Marland Kitchen ezetimibe-simvastatin  (VYTORIN) 10-40 MG tablet TAKE 1 TABLET BY MOUTH EVERYDAY AT BEDTIME  . fenofibrate 160 MG tablet Take 1 tablet (160 mg total) by mouth daily.  . fish oil-omega-3 fatty acids 1000 MG capsule Take 2 g by mouth daily.  Marland Kitchen losartan-hydrochlorothiazide (HYZAAR) 100-25 MG tablet Take 1 tablet by mouth daily.  . metoprolol tartrate (LOPRESSOR) 50 MG tablet Take 1 tablet (50 mg total) by mouth 2 (two) times daily.  . nitroGLYCERIN (NITROSTAT) 0.4 MG SL tablet Place 1 tablet (0.4 mg total) under the tongue every 5 (five) minutes as needed.  . [DISCONTINUED] ALPRAZolam (XANAX) 0.5 MG tablet Take one tablet up to BID prn anxiety  . [DISCONTINUED] zolpidem (AMBIEN) 10 MG tablet Take 1 tablet (10 mg total) by mouth at bedtime as needed. for sleep  . zolpidem (AMBIEN) 5 MG tablet Take 1 tablet (5 mg total) by mouth at bedtime as needed for sleep.  . [DISCONTINUED] guaiFENesin (MUCINEX) 600 MG 12 hr tablet Take 1 tablet (600 mg total) by mouth 2 (two) times daily.  . [DISCONTINUED] ketoconazole (NIZORAL) 2 % cream Apply 1 application topically as needed for irritation.  . [DISCONTINUED] pantoprazole (PROTONIX) 40 MG tablet Take 40 mg by mouth as needed.  . [DISCONTINUED] predniSONE (DELTASONE) 20 MG tablet Take 3 tabs po daily for 3 days then 2 tabs po daily for 3 days then 1 tab po daily for 3 days   No facility-administered encounter medications on file as of  07/23/2020.     Review of Systems  Constitutional: Negative for chills and fever.  HENT: Negative for congestion, rhinorrhea and sore throat.   Respiratory: Negative for cough, shortness of breath and wheezing.   Cardiovascular: Negative for chest pain and leg swelling.  Gastrointestinal: Negative for abdominal pain, diarrhea, nausea and vomiting.  Genitourinary: Negative for dysuria and frequency.  Skin: Negative for rash.  Neurological: Negative for dizziness, weakness and headaches.     Vitals BP 134/80   Pulse 73   Temp 97.6 F (36.4 C)    Ht $R'6\' 1"'UZ$  (1.854 m)   Wt 224 lb (101.6 kg)   SpO2 98%   BMI 29.55 kg/m   Objective:   Physical Exam Vitals and nursing note reviewed.  Constitutional:      General: He is not in acute distress.    Appearance: Normal appearance. He is not ill-appearing.  HENT:     Head: Normocephalic.     Nose: Nose normal. No congestion.     Mouth/Throat:     Mouth: Mucous membranes are moist.     Pharynx: No oropharyngeal exudate.  Eyes:     Extraocular Movements: Extraocular movements intact.     Conjunctiva/sclera: Conjunctivae normal.     Pupils: Pupils are equal, round, and reactive to light.  Cardiovascular:     Rate and Rhythm: Normal rate and regular rhythm.     Pulses: Normal pulses.     Heart sounds: Normal heart sounds. No murmur heard.   Pulmonary:     Effort: Pulmonary effort is normal.     Breath sounds: Normal breath sounds. No wheezing, rhonchi or rales.  Musculoskeletal:        General: Normal range of motion.     Right lower leg: No edema.     Left lower leg: No edema.  Skin:    General: Skin is warm and dry.     Findings: No rash.  Neurological:     General: No focal deficit present.     Mental Status: He is alert and oriented to person, place, and time.     Cranial Nerves: No cranial nerve deficit.  Psychiatric:        Mood and Affect: Mood normal.        Behavior: Behavior normal.        Thought Content: Thought content normal.        Judgment: Judgment normal.      Assessment and Plan   1. Insomnia, unspecified type  2. Hyperlipidemia LDL goal <100 - CBC - CMP14+EGFR - Lipid panel  3. Anxiety    Anxiety/insomnia- Pt has some xanax in bottle recommending tapering off this.  Only taking it 1-2x per week. Pt stating wiling ot come off the xanax, but wanting the ambien for sleep. Declining seeing psychiatry for anxiety and insomnia.  Pt willing to decrease the ambien from$RemoveBefore'10mg'GNpLZttStWBiW$  to $R'5mg'XN$  qhs.  And recommended starting an alternative medication for  sleep.  Pt declining at this time.  HLD- uncontrolled.  Cont medications and f/u with cards.  Labs ordered today. Will call with results.  F/u 5mo or prn.   Pt may have video visit every other visit for his insomnia f/u for ambien.

## 2020-07-24 LAB — CMP14+EGFR
ALT: 21 IU/L (ref 0–44)
AST: 29 IU/L (ref 0–40)
Albumin/Globulin Ratio: 2 (ref 1.2–2.2)
Albumin: 4.6 g/dL (ref 3.8–4.8)
Alkaline Phosphatase: 29 IU/L — ABNORMAL LOW (ref 48–121)
BUN/Creatinine Ratio: 16 (ref 10–24)
BUN: 21 mg/dL (ref 8–27)
Bilirubin Total: 0.5 mg/dL (ref 0.0–1.2)
CO2: 29 mmol/L (ref 20–29)
Calcium: 9.6 mg/dL (ref 8.6–10.2)
Chloride: 98 mmol/L (ref 96–106)
Creatinine, Ser: 1.35 mg/dL — ABNORMAL HIGH (ref 0.76–1.27)
GFR calc Af Amer: 62 mL/min/{1.73_m2} (ref >59)
GFR calc non Af Amer: 54 mL/min/{1.73_m2} — ABNORMAL LOW (ref >59)
Globulin, Total: 2.3 g/dL (ref 1.5–4.5)
Glucose: 114 mg/dL — ABNORMAL HIGH (ref 65–99)
Potassium: 5 mmol/L (ref 3.5–5.2)
Sodium: 137 mmol/L (ref 134–144)
Total Protein: 6.9 g/dL (ref 6.0–8.5)

## 2020-07-24 LAB — LIPID PANEL
Chol/HDL Ratio: 7 ratio — ABNORMAL HIGH (ref 0.0–5.0)
Cholesterol, Total: 299 mg/dL — ABNORMAL HIGH (ref 100–199)
HDL: 43 mg/dL (ref >39)
LDL Chol Calc (NIH): 192 mg/dL — ABNORMAL HIGH (ref 0–99)
Triglycerides: 323 mg/dL — ABNORMAL HIGH (ref 0–149)
VLDL Cholesterol Cal: 64 mg/dL — ABNORMAL HIGH (ref 5–40)

## 2020-07-24 LAB — CBC
Hematocrit: 45 % (ref 37.5–51.0)
Hemoglobin: 14.3 g/dL (ref 13.0–17.7)
MCH: 30 pg (ref 26.6–33.0)
MCHC: 31.8 g/dL (ref 31.5–35.7)
MCV: 94 fL (ref 79–97)
Platelets: 164 10*3/uL (ref 150–450)
RBC: 4.77 x10E6/uL (ref 4.14–5.80)
RDW: 12.6 % (ref 11.6–15.4)
WBC: 8.2 10*3/uL (ref 3.4–10.8)

## 2020-07-26 DIAGNOSIS — Z23 Encounter for immunization: Secondary | ICD-10-CM | POA: Diagnosis not present

## 2020-07-27 ENCOUNTER — Telehealth: Payer: Self-pay | Admitting: Family Medicine

## 2020-07-27 MED ORDER — ALPRAZOLAM 0.25 MG PO TABS: ORAL_TABLET | ORAL | 0 refills | Status: DC

## 2020-07-27 NOTE — Telephone Encounter (Signed)
Pt wife contacted (Jenny-DPR) and verbalized understanding.

## 2020-07-27 NOTE — Telephone Encounter (Signed)
Pt had tablets left in his bottle at the visit.  Also pt was advised how to taper off, he was stating in office he only takes the xanax 1-2x per week.  But will give him a tapering dose of xanax, but this will be his last script and if he wants to try alternative for anxiety/insomnia to let me know.   Will send taper to pharmacy.  Dr. Lovena Le

## 2020-07-27 NOTE — Telephone Encounter (Signed)
Pt returned call for results. While speaking with pt about results, he brought up Ambien and Xanax. Pt states that he was on 10 mg Ambien but was reduced to 5 mg Ambien and is not getting any sleep. Pt states that he has taking the Ambien all week and is not sleeping well at all. Pt also discontinued from Xanax and would like provider to wean him from Xanax instead of stopping cold [place]. Pt has 4 tablets of Xanax left. Pt states the Xanax give him some relief in the evening. Pt states he would appreciate any and everything that provider could do. Please advise. Thank you.

## 2020-07-27 NOTE — Addendum Note (Signed)
Addended by: Erven Colla on: 07/27/2020 12:04 PM   Modules accepted: Orders

## 2020-09-06 ENCOUNTER — Telehealth: Payer: Self-pay

## 2020-09-06 NOTE — Telephone Encounter (Signed)
Left message to return call 

## 2020-09-06 NOTE — Telephone Encounter (Signed)
Patient having a really hard time right now.  He said that the Ambien 5mg  was not working.  Needs to go back up to 10mg .  He said that he isn't sleeping at all.  Takes his meds and goes to bed at 9:30 and then lays there till 3 am. Has tried several natural supplements and Melatonin with no success.  Really tired and needs some sleep.  Also having some anxiety but knows she won't give him anymore Xanax.  [facility]   *Also he wanted Dr. Lovena Le to know that he forgot to fast when he went for his last labwork so that is probably why his cholesterol was high.

## 2020-09-06 NOTE — Telephone Encounter (Signed)
Would recommend trying trazodone for sleep. Long discussion on last visit the need to decrease Ambien for pt over 67 yrs old.  Let us know if he wants to try this.   Thx,   Dr. Lovena Le

## 2020-09-07 NOTE — Telephone Encounter (Signed)
Called and discussed with pt's wife Sonia Baller who is on his dpr and she states she will discuss with him and let us know what he wants to do.

## 2020-09-07 NOTE — Telephone Encounter (Signed)
Would recommend referral to psychiatry if wanting to continue with those medications and evaluation of insomnia/anxiety.   Thx,   Dr. Lovena Le

## 2020-09-07 NOTE — Telephone Encounter (Signed)
Pt returned call. Pt states that he respects what provider is saying and doing and pt has made an honest effort to come off Ambien. Pt has tried OTC sleep meds but no help. Pt has tried Relaxium, Nyquil type of sleep aid, Melatonin and CBD oil. Pt states he had no luck with any of that and the CBD oil caused upset stomach. Pt states he had some of the 5 mg Ambien left so he took 2 to equal 10 mg. Pt states he was able to get 4-5 hours of sleep and woke up clear minded with no effects. Pt states he has always had issues with having a regular sleep schedule due to working 12 hours. Pt states he is very active but runs out of energy due to no sleep. Pt states he is 69 years old and clear minded. Pt becomes shaky and states that his body needs rest.   Pt was on Xanax because he saw his wife pass away in front of him and they had to revive her.   Please advise. Thank you! [phone]

## 2020-09-12 ENCOUNTER — Ambulatory Visit: Payer: Medicare Other | Admitting: Cardiology

## 2020-09-17 DIAGNOSIS — L728 Other follicular cysts of the skin and subcutaneous tissue: Secondary | ICD-10-CM | POA: Diagnosis not present

## 2020-09-17 DIAGNOSIS — L258 Unspecified contact dermatitis due to other agents: Secondary | ICD-10-CM | POA: Diagnosis not present

## 2020-09-17 DIAGNOSIS — D225 Melanocytic nevi of trunk: Secondary | ICD-10-CM | POA: Diagnosis not present

## 2020-09-17 MED ORDER — ZOLPIDEM TARTRATE 5 MG PO TABS: 5.0000 mg | ORAL_TABLET | Freq: Every evening | ORAL | 0 refills | Status: DC | PRN

## 2020-09-17 NOTE — Telephone Encounter (Addendum)
Patient states he is not changing doctors at this time and wants Dr Lovena Le to provide care. Patient does not want to see psychiatristt and would like to stick with the Ambien 5 mg daily as advised and needs refill sent [facility]

## 2020-09-17 NOTE — Addendum Note (Signed)
Addended by: Erven Colla on: 09/17/2020 04:45 PM   Modules accepted: Orders

## 2020-09-18 DIAGNOSIS — R220 Localized swelling, mass and lump, head: Secondary | ICD-10-CM | POA: Diagnosis not present

## 2020-09-20 ENCOUNTER — Other Ambulatory Visit (HOSPITAL_COMMUNITY): Payer: Self-pay | Admitting: Otolaryngology

## 2020-09-20 ENCOUNTER — Telehealth: Payer: Self-pay | Admitting: Family Medicine

## 2020-09-20 DIAGNOSIS — R220 Localized swelling, mass and lump, head: Secondary | ICD-10-CM

## 2020-09-20 NOTE — Telephone Encounter (Signed)
[facility] requesting refill on Losartan HCTZ 100-25 mg tablets. Take one tablet po every day. Pt last seen 07/23/20 for insomnia. Please advise. Thank you

## 2020-09-21 MED ORDER — LOSARTAN POTASSIUM-HCTZ 100-25 MG PO TABS: 1.0000 | ORAL_TABLET | Freq: Every day | ORAL | 0 refills | Status: DC

## 2020-09-21 NOTE — Addendum Note (Signed)
Addended by: Erven Colla on: 09/21/2020 12:22 PM   Modules accepted: Orders

## 2020-09-24 ENCOUNTER — Encounter: Payer: Self-pay | Admitting: Internal Medicine

## 2020-09-25 ENCOUNTER — Ambulatory Visit (INDEPENDENT_AMBULATORY_CARE_PROVIDER_SITE_OTHER): Payer: Medicare Other | Admitting: Internal Medicine

## 2020-09-25 ENCOUNTER — Other Ambulatory Visit: Payer: Self-pay

## 2020-09-25 ENCOUNTER — Telehealth: Payer: Self-pay

## 2020-09-25 ENCOUNTER — Encounter: Payer: Self-pay | Admitting: Internal Medicine

## 2020-09-25 VITALS — BP 133/77 | HR 57 | Temp 97.0°F | Resp 18 | Ht 73.0 in | Wt 218.0 lb

## 2020-09-25 DIAGNOSIS — R350 Frequency of micturition: Secondary | ICD-10-CM

## 2020-09-25 DIAGNOSIS — J439 Emphysema, unspecified: Secondary | ICD-10-CM

## 2020-09-25 DIAGNOSIS — Z7689 Persons encountering health services in other specified circumstances: Secondary | ICD-10-CM | POA: Diagnosis not present

## 2020-09-25 DIAGNOSIS — I483 Typical atrial flutter: Secondary | ICD-10-CM

## 2020-09-25 DIAGNOSIS — F419 Anxiety disorder, unspecified: Secondary | ICD-10-CM | POA: Diagnosis not present

## 2020-09-25 DIAGNOSIS — I2510 Atherosclerotic heart disease of native coronary artery without angina pectoris: Secondary | ICD-10-CM

## 2020-09-25 DIAGNOSIS — N401 Enlarged prostate with lower urinary tract symptoms: Secondary | ICD-10-CM | POA: Insufficient documentation

## 2020-09-25 DIAGNOSIS — Z1211 Encounter for screening for malignant neoplasm of colon: Secondary | ICD-10-CM

## 2020-09-25 DIAGNOSIS — I10 Essential (primary) hypertension: Secondary | ICD-10-CM | POA: Diagnosis not present

## 2020-09-25 DIAGNOSIS — E785 Hyperlipidemia, unspecified: Secondary | ICD-10-CM

## 2020-09-25 LAB — POCT URINALYSIS DIP (CLINITEK)
Bilirubin, UA: NEGATIVE
Blood, UA: NEGATIVE
Glucose, UA: NEGATIVE mg/dL
Ketones, POC UA: NEGATIVE mg/dL
Leukocytes, UA: NEGATIVE
Nitrite, UA: NEGATIVE
POC PROTEIN,UA: NEGATIVE
Spec Grav, UA: 1.015 (ref 1.010–1.025)
Urobilinogen, UA: 0.2 E.U./dL
pH, UA: 7 (ref 5.0–8.0)

## 2020-09-25 MED ORDER — TAMSULOSIN HCL 0.4 MG PO CAPS: 0.4000 mg | ORAL_CAPSULE | Freq: Every day | ORAL | 2 refills | Status: DC

## 2020-09-25 NOTE — Assessment & Plan Note (Signed)
Care established Previous chart reviewed History and medications reviewed with the patient 

## 2020-09-25 NOTE — Progress Notes (Signed)
u

## 2020-09-25 NOTE — Patient Instructions (Addendum)
Please continue to take medications as prescribed.  You are being referred to GI for screening colonoscopy.  Please use Symbicort inhaler regularly and albuterol inhaler as needed for shortness of breath or wheezing.  Please maintain simple sleep hygiene. - Maintain dark and non-noisy environment in the bedroom. - Please use the bedroom for sleep and sexual activity only. - Do not use electronic devices in the bedroom. - Please take dinner at least 2 hours before bedtime. - Please avoid caffeinated products in the evening, including coffee, soft drinks. - Please try to maintain the regular sleep-wake cycle - Go to bed and wake up at the same time.  It was a pleasure to meet you today.  We consider it our privilege to take care of you.

## 2020-09-25 NOTE — Assessment & Plan Note (Signed)
Takes Xanax PRN Associated with previous incident with his wife (cardiac arrest) Symptoms better now Takes Ambien 5 mg QD for insmonia, not able to sleep properly - sleep hygiene discussed, plan to restart Ambien 10 mg QD if persistent symptoms

## 2020-09-25 NOTE — Telephone Encounter (Signed)
No call back needed, patient walked in to let us know that he is moving his care to [facility] starting today.  I informed patient that I would make a note in his chart and that records would not need to be sent since they were a part on [facility].

## 2020-09-25 NOTE — Assessment & Plan Note (Signed)
S/p cardiac ablation In sinus rhythm currently On Metoprolol

## 2020-09-25 NOTE — Assessment & Plan Note (Addendum)
Quit smoking in 1999 On Symbicort and Albuterol Advised to comply with Symbicort to avoid frequent exacerbations

## 2020-09-25 NOTE — Assessment & Plan Note (Signed)
BP Readings from Last 1 Encounters:  09/25/20 133/77   Well-controlled with Hyzaar and Metoprolol Counseled for compliance with the medications Advised DASH diet and moderate exercise/walking, at least 150 mins/week  

## 2020-09-25 NOTE — Assessment & Plan Note (Signed)
S/p CABG in the past Recent nuclear study showed no active ischemia with LVEF 51%. Continue Aspirin and Vytorin Continue Metprolol

## 2020-09-25 NOTE — Progress Notes (Signed)
New Patient Office Visit  Subjective:  Patient ID: Dennis [name], male    DOB: October 02, 1951  Age: 69 y.o. MRN: [id number]  CC:  Chief Complaint  Patient presents with  . New Patient (Initial Visit)    new pt has cholesterol issues just had blood work drawn 07-23-20 will need repeat in feb. does have mouth issues but does have ent that handles that     HPI Dennis [name] is a 69 year old male with PMH of CAD s/p CABG, HTN, HLD, COPD, anxiety and insomnia presents for establishing care.  Patient takes Hyzaar and Metoprolol for HTN. His BP is well-controlled. He has had CABG for CAD and takes Aspirin 81 mg daily. He has a h/o atrial flutter, s/p cardiac ablation in 2017. He has been in sinus rhythm since. He follows up with Cardiologist. He denies headache, dizziness, chest pain, dyspnea, or palpitations.  He uses Symbicort infrequently for COPD and uses Albuterol as needed. He reports multiple episodes of COPD exacerbation in a year. He states that he gets mouth sores and infections due to Symbicort. Patient was counseled for proper use of Symbicort and mouth hygiene. Patient follows up with ENT for chronic mouth issues.  He also has h/o anxiety and insomnia, for which he takes Ambien 5 mg QD and Xanax 0.25 mg PRN. He was on Ambien 10 mg QD in the past, and has been having difficulty sleeping currently. His anxiety is well-controlled.  Patient c/o nocturia and increased urinary frequency for last few months. He denies dysuria, hematuria, or hesitancy. Patient has had Urologic evaluation including Cystoscopy for hematuria in the past, which was normal according to the patient. He currently denies hematuria.  Patient is due for colonoscopy, GI referral provided.  Patient has had COVID and flu vaccine. PCV13 in 2016. He denies Shingrix vaccine today.    Past Medical History:  Diagnosis Date  . Atrial flutter ([place])    Diagnosed by ECG August 2015  . Colitis   . COPD (chronic obstructive  pulmonary disease) ([place])   . Coronary atherosclerosis of native coronary artery    Multivessel status post CABG  . Essential hypertension   . Hyperlipidemia   . Insomnia     Past Surgical History:  Procedure Laterality Date  . COLONOSCOPY WITH PROPOFOL N/A 08/30/2015   Procedure: COLONOSCOPY WITH PROPOFOL at cecum at 0814; withdrawal time=8 minutes;  Surgeon: Daneil Dolin, MD;  Location: [facility];  Service: Endoscopy;  Laterality: N/A;  . CORONARY ARTERY BYPASS GRAFT  2005   Dr. Lucianne Lei Trigt: LIMA to LAD, right radial to circumflex, SVG to RCA  . CORONARY ARTERY BYPASS GRAFT  10/16/2004  . ELECTROPHYSIOLOGIC STUDY N/A 11/21/2016   Procedure: A-Flutter Ablation;  Surgeon: Evans Lance, MD;  Location: [facility];  Service: Cardiovascular;  Laterality: N/A;  . TEE WITHOUT CARDIOVERSION N/A 11/21/2016   Procedure: TRANSESOPHAGEAL ECHOCARDIOGRAM (TEE);  Surgeon: Sanda Klein, MD;  Location: [facility];  Service: Cardiovascular;  Laterality: N/A;    Family History  Problem Relation Age of Onset  . Hypertension Mother   . Alcohol abuse Mother   . Alcohol abuse Father   . Colon cancer Neg Hx     Social History   Socioeconomic History  . Marital status: Married    Spouse name: Not on file  . Number of children: Not on file  . Years of education: Not on file  . Highest education level: Not on file  Occupational History  .  Not on file  Tobacco Use  . Smoking status: Former Smoker    Packs/day: 2.00    Years: 27.00    Pack years: 54.00    Types: Cigarettes    Start date: 02/01/1971    Quit date: 01/31/1998    Years since quitting: 22.6  . Smokeless tobacco: Current User    Types: Chew  Vaping Use  . Vaping Use: Never used  Substance and Sexual Activity  . Alcohol use: Yes    Alcohol/week: 0.0 standard drinks    Comment: Drinks beer daily, typically 2-8 a day. Also 4 oz red wine an evening.  . Drug use: No  . Sexual activity: Not on file  Other Topics Concern  .  Not on file  Social History Narrative  . Not on file   Social Determinants of Health   Financial Resource Strain:   . Difficulty of Paying Living Expenses: Not on file  Food Insecurity:   . Worried About Charity fundraiser in the Last Year: Not on file  . Ran Out of Food in the Last Year: Not on file  Transportation Needs:   . Lack of Transportation (Medical): Not on file  . Lack of Transportation (Non-Medical): Not on file  Physical Activity:   . Days of Exercise per Week: Not on file  . Minutes of Exercise per Session: Not on file  Stress:   . Feeling of Stress : Not on file  Social Connections:   . Frequency of Communication with Friends and Family: Not on file  . Frequency of Social Gatherings with Friends and Family: Not on file  . Attends Religious Services: Not on file  . Active Member of Clubs or Organizations: Not on file  . Attends Archivist Meetings: Not on file  . Marital Status: Not on file  Intimate Partner Violence:   . Fear of Current or Ex-Partner: Not on file  . Emotionally Abused: Not on file  . Physically Abused: Not on file  . Sexually Abused: Not on file    ROS Review of Systems  Constitutional: Negative for chills and fever.  HENT: Negative for congestion and sore throat.   Eyes: Negative for pain and discharge.  Respiratory: Negative for cough and shortness of breath.   Cardiovascular: Negative for chest pain and palpitations.  Gastrointestinal: Negative for constipation, diarrhea, nausea and vomiting.  Endocrine: Negative for polydipsia and polyuria.  Genitourinary: Positive for frequency. Negative for dysuria, flank pain and hematuria.       Nocturia  Musculoskeletal: Negative for neck pain and neck stiffness.  Skin: Negative for rash.  Neurological: Negative for dizziness, weakness, numbness and headaches.  Psychiatric/Behavioral: Positive for sleep disturbance. Negative for agitation, behavioral problems, dysphoric mood and  suicidal ideas. The patient is nervous/anxious.     Objective:   Today's Vitals: BP 133/77 (BP Location: Right Arm, Patient Position: Sitting, Cuff Size: Normal)   Pulse (!) 57   Temp (!) 97 F (36.1 C) (Temporal)   Resp 18   Ht 6\' 1"  (1.854 m)   Wt 218 lb (98.9 kg)   SpO2 98%   BMI 28.76 kg/m   Physical Exam Vitals reviewed.  Constitutional:      General: He is not in acute distress.    Appearance: He is not diaphoretic.  HENT:     Head: Normocephalic and atraumatic.     Nose: Nose normal.     Mouth/Throat:     Mouth: Mucous membranes are moist.  Eyes:     General: No scleral icterus.    Extraocular Movements: Extraocular movements intact.     Pupils: Pupils are equal, round, and reactive to light.  Cardiovascular:     Rate and Rhythm: Normal rate and regular rhythm.     Pulses: Normal pulses.     Heart sounds: Normal heart sounds. No murmur heard.   Pulmonary:     Breath sounds: Normal breath sounds. No wheezing or rales.  Abdominal:     Palpations: Abdomen is soft.     Tenderness: There is no abdominal tenderness.  Musculoskeletal:     Cervical back: Neck supple. No tenderness.     Right lower leg: No edema.     Left lower leg: No edema.  Skin:    General: Skin is warm.     Findings: No rash.  Neurological:     General: No focal deficit present.     Mental Status: He is alert and oriented to person, place, and time.     Sensory: No sensory deficit.     Motor: No weakness.  Psychiatric:        Mood and Affect: Mood normal.        Behavior: Behavior normal.     Assessment & Plan:   Problem List Items Addressed This Visit      Encounter to establish care - Primary   Care established Previous chart reviewed History and medications reviewed with the patient       Cardiovascular and Mediastinum   Essential hypertension, benign    BP Readings from Last 1 Encounters:  09/25/20 133/77   Well-controlled with Hyzaar and Metoprolol Counseled for  compliance with the medications Advised DASH diet and moderate exercise/walking, at least 150 mins/week       Coronary atherosclerosis of native coronary artery    S/p CABG in the past Recent nuclear study showed no active ischemia with LVEF 51%. Continue Aspirin and Vytorin Continue Metprolol      Atrial flutter ([facility])    S/p cardiac ablation In sinus rhythm currently On Metoprolol        Respiratory   COPD (chronic obstructive pulmonary disease) ([facility])    Quit smoking in 1999 On Symbicort and Albuterol Advised to comply with Symbicort to avoid frequent exacerbations        Other   Hyperlipidemia LDL goal <100    Last lipid profile reviewed, was not done fasting On Vytorin and Fenofibrate      Anxiety    Takes Xanax PRN Associated with previous incident with his wife (cardiac arrest) Symptoms better now Takes Ambien 5 mg QD for insmonia, not able to sleep properly - sleep hygiene discussed, plan to restart Ambien 10 mg QD if persistent symptoms            Benign prostatic hyperplasia with urinary frequency    Lab Results  Component Value Date   PSA1 0.8 01/04/2020   PSA1 0.8 04/12/2018   PSA1 0.9 06/02/2017   PSA 0.8 02/09/2015   PSA 0.70 12/29/2013   C/o nocturia and frequency No dysuria or hematuria, UA negative for LE and nitrites Flomax 0.4 mg QD      Relevant Medications   tamsulosin (FLOMAX) 0.4 MG CAPS capsule    Other Visit Diagnoses    Special screening for malignant neoplasms, colon       Relevant Orders   Ambulatory referral to Gastroenterology      Outpatient Encounter Medications as of 09/25/2020  Medication  Sig  . albuterol (PROVENTIL) (2.5 MG/3ML) 0.083% nebulizer solution Take 3 mLs (2.5 mg total) by nebulization every 6 (six) hours as needed for wheezing or shortness of breath.  Marland Kitchen albuterol (VENTOLIN HFA) 108 (90 Base) MCG/ACT inhaler Inhale 2 puffs into the lungs every 4 (four) hours as needed for wheezing.  Marland Kitchen aspirin EC 81 MG  tablet Take 1 tablet (81 mg total) by mouth daily.  . budesonide-formoterol (SYMBICORT) 160-4.5 MCG/ACT inhaler TAKE 2 PUFFS FIRST THING IN MORNING AND THEN ANOTHER 2 PUFFS ABOUT 12 HOURS LATER.  Marland Kitchen ezetimibe-simvastatin (VYTORIN) 10-40 MG tablet TAKE 1 TABLET BY MOUTH EVERYDAY AT BEDTIME  . fenofibrate 160 MG tablet Take 1 tablet (160 mg total) by mouth daily.  . fish oil-omega-3 fatty acids 1000 MG capsule Take 2 g by mouth daily.  Marland Kitchen losartan-hydrochlorothiazide (HYZAAR) 100-25 MG tablet Take 1 tablet by mouth daily.  . metoprolol tartrate (LOPRESSOR) 50 MG tablet Take 1 tablet (50 mg total) by mouth 2 (two) times daily.  . nitroGLYCERIN (NITROSTAT) 0.4 MG SL tablet Place 1 tablet (0.4 mg total) under the tongue every 5 (five) minutes as needed.  . zolpidem (AMBIEN) 5 MG tablet Take 1 tablet (5 mg total) by mouth at bedtime as needed for sleep.  . tamsulosin (FLOMAX) 0.4 MG CAPS capsule Take 1 capsule (0.4 mg total) by mouth daily.   No facility-administered encounter medications on file as of 09/25/2020.    Follow-up: Return in about 3 weeks (around 10/16/2020).   Lindell Spar, MD

## 2020-09-25 NOTE — Assessment & Plan Note (Signed)
Last lipid profile reviewed, was not done fasting On Vytorin and Fenofibrate

## 2020-09-25 NOTE — Assessment & Plan Note (Signed)
Lab Results  Component Value Date   PSA1 0.8 01/04/2020   PSA1 0.8 04/12/2018   PSA1 0.9 06/02/2017   PSA 0.8 02/09/2015   PSA 0.70 12/29/2013   C/o nocturia and frequency No dysuria or hematuria, UA negative for LE and nitrites Flomax 0.4 mg QD

## 2020-09-26 ENCOUNTER — Other Ambulatory Visit: Payer: Self-pay | Admitting: *Deleted

## 2020-09-26 DIAGNOSIS — E785 Hyperlipidemia, unspecified: Secondary | ICD-10-CM

## 2020-09-28 ENCOUNTER — Other Ambulatory Visit: Payer: Self-pay | Admitting: *Deleted

## 2020-09-28 DIAGNOSIS — E785 Hyperlipidemia, unspecified: Secondary | ICD-10-CM

## 2020-09-28 MED ORDER — EZETIMIBE-SIMVASTATIN 10-40 MG PO TABS: ORAL_TABLET | ORAL | 1 refills | Status: DC

## 2020-10-03 DIAGNOSIS — Z23 Encounter for immunization: Secondary | ICD-10-CM | POA: Diagnosis not present

## 2020-10-08 DIAGNOSIS — R220 Localized swelling, mass and lump, head: Secondary | ICD-10-CM | POA: Diagnosis not present

## 2020-10-15 ENCOUNTER — Other Ambulatory Visit: Payer: Self-pay

## 2020-10-15 ENCOUNTER — Telehealth: Payer: Self-pay | Admitting: Internal Medicine

## 2020-10-15 DIAGNOSIS — G4700 Insomnia, unspecified: Secondary | ICD-10-CM

## 2020-10-15 MED ORDER — ZOLPIDEM TARTRATE 5 MG PO TABS: 5.0000 mg | ORAL_TABLET | Freq: Every evening | ORAL | 0 refills | Status: DC | PRN

## 2020-10-15 NOTE — Telephone Encounter (Signed)
Patient is needing a script for ambien 10mg   He was on 5mg  but is needing that sent to pharmacy per patient dr patel is aware of this change [facility] [place]

## 2020-10-15 NOTE — Telephone Encounter (Signed)
Refill sent.

## 2020-10-16 ENCOUNTER — Telehealth: Payer: Medicare Other | Admitting: Internal Medicine

## 2020-10-16 ENCOUNTER — Other Ambulatory Visit: Payer: Self-pay | Admitting: Internal Medicine

## 2020-10-16 DIAGNOSIS — G4700 Insomnia, unspecified: Secondary | ICD-10-CM

## 2020-10-16 MED ORDER — ZOLPIDEM TARTRATE 10 MG PO TABS: 10.0000 mg | ORAL_TABLET | Freq: Every evening | ORAL | 1 refills | Status: DC | PRN

## 2020-11-12 ENCOUNTER — Encounter: Payer: Self-pay | Admitting: Gastroenterology

## 2020-11-12 NOTE — Progress Notes (Signed)
Referring Provider: Lindell Spar, MD Primary Care Physician:  Lindell Spar, MD  Primary GI: Dr. Gala Romney  Patient Location: Home   Provider Location: [facility] [place] office   Reason for Visit:  History of adenomatous colon polyps, time for surveillance colonoscopy   Persons present on the virtual encounter, with roles:  Aliene Altes, PA (provider), Dennis [name] (patient)   Total time (minutes) spent on medical discussion:  13 minutes  Virtual Visit via Telephone Note Due to COVID-19, visit is conducted virtually and was requested by patient.   I connected with Dennis [name] on 11/14/20 at  9:00 AM EST by telephone and verified that I am speaking with the correct person using two identifiers.   I discussed the limitations, risks, security and privacy concerns of performing an evaluation and management service by telephone and the availability of in person appointments. I also discussed with the patient that there may be a patient responsible charge related to this service. The patient expressed understanding and agreed to proceed.  Chief Complaint  Patient presents with  . Colonoscopy     History of Present Illness: Dennis [name] is a 69 y.o. male presenting today at the request of Lindell Spar, MD for consult colonoscopy.  Patient's last colonoscopy was in October 2016 for hematochezia and history of advanced adenoma with findings including internal hemorrhoids-likely source of hematochezia, otherwise normal exam.  Recommended repeat colonoscopy in 5 years.  Notably, he has history of tubulovillous adenoma in 2006.  We have not seen patient since his last colonoscopy.  Today:  Occasional rectal bleeding. Bright red blood on toilet tissue and occasionally in toilet water or on stool. This is chronic without change. Occurs once every 2-3 months. Occasional dark spots in his stools, but no complete black stools. No lightheadedness, dizziness, pre-syncope, or syncope. No constipation  or diarrhea. Has a lot of gas and some discomfort in the epigastric area after eating a meal. Burping. This has been going on for about 4 weeks. When symptoms first started, he had fairly significant epigastric abdominal discomfort. Didn't eat for a couple of days. His appetite has returned to normal. Currently taking Tums as needed which helps quite a bit. Denies GERD symptoms or dysphagia. No nausea or vomiting.  No NSAIDs other than 81 mg aspirin.   Doesn't want EGD.   No chest pain or heart palpitation. SOB with exertion is at baseline with history of COPD. No SOB at rest. No cough. No oxygen.   Past Medical History:  Diagnosis Date  . Atrial flutter ([place])    Diagnosed by ECG August 2015  . Colitis   . COPD (chronic obstructive pulmonary disease) ([place])   . Coronary atherosclerosis of native coronary artery    Multivessel status post CABG  . Essential hypertension   . Hyperlipidemia   . Insomnia      Past Surgical History:  Procedure Laterality Date  . COLONOSCOPY  01/29/2005   Dr. Gala Romney; rectal polyp s/p polypectomy, otherwise normal.  Pathology with tubulovillous adenoma.  . COLONOSCOPY  02/06/2008   Dr. Gala Romney; minimal internal hemorrhoids, diminutive polyp in the splenic flexure s/p cold biopsy removal, otherwise normal.  Pathology with benign polypoid colonic mucosa.  . COLONOSCOPY WITH PROPOFOL N/A 08/30/2015   Procedure: COLONOSCOPY WITH PROPOFOL at cecum at 0814; withdrawal time=8 minutes;  Surgeon: Daneil Dolin, MD; internal hemorrhoids-likely source of hematochezia, otherwise normal exam.  Repeat in 5 years.  . CORONARY ARTERY BYPASS GRAFT  2005  Dr. Lucianne Lei Trigt: LIMA to LAD, right radial to circumflex, SVG to RCA  . CORONARY ARTERY BYPASS GRAFT  10/16/2004  . ELECTROPHYSIOLOGIC STUDY N/A 11/21/2016   Procedure: A-Flutter Ablation;  Surgeon: Evans Lance, MD;  Location: [facility] [facility];  Service: Cardiovascular;  Laterality: N/A;  . TEE WITHOUT CARDIOVERSION N/A  11/21/2016   Procedure: TRANSESOPHAGEAL ECHOCARDIOGRAM (TEE);  Surgeon: Sanda Klein, MD;  Location: [facility];  Service: Cardiovascular;  Laterality: N/A;     Current Meds  Medication Sig  . albuterol (PROVENTIL) (2.5 MG/3ML) 0.083% nebulizer solution Take 3 mLs (2.5 mg total) by nebulization every 6 (six) hours as needed for wheezing or shortness of breath.  Marland Kitchen albuterol (VENTOLIN HFA) 108 (90 Base) MCG/ACT inhaler Inhale 2 puffs into the lungs every 4 (four) hours as needed for wheezing.  Marland Kitchen aspirin EC 81 MG tablet Take 1 tablet (81 mg total) by mouth daily.  . budesonide-formoterol (SYMBICORT) 160-4.5 MCG/ACT inhaler TAKE 2 PUFFS FIRST THING IN MORNING AND THEN ANOTHER 2 PUFFS ABOUT 12 HOURS LATER.  Marland Kitchen ezetimibe-simvastatin (VYTORIN) 10-40 MG tablet TAKE 1 TABLET BY MOUTH EVERYDAY AT BEDTIME  . fenofibrate 160 MG tablet Take 1 tablet (160 mg total) by mouth daily.  . fish oil-omega-3 fatty acids 1000 MG capsule Take 2 g by mouth daily.  Marland Kitchen losartan-hydrochlorothiazide (HYZAAR) 100-25 MG tablet Take 1 tablet by mouth daily.  . metoprolol tartrate (LOPRESSOR) 50 MG tablet Take 1 tablet (50 mg total) by mouth 2 (two) times daily.  . nitroGLYCERIN (NITROSTAT) 0.4 MG SL tablet Place 1 tablet (0.4 mg total) under the tongue every 5 (five) minutes as needed.  . tamsulosin (FLOMAX) 0.4 MG CAPS capsule Take 1 capsule (0.4 mg total) by mouth daily.  Marland Kitchen zolpidem (AMBIEN) 10 MG tablet Take 1 tablet (10 mg total) by mouth at bedtime as needed for sleep.     Family History  Problem Relation Age of Onset  . Hypertension Mother   . Alcohol abuse Mother   . Alcohol abuse Father   . Colon cancer Neg Hx     Social History   Socioeconomic History  . Marital status: Married    Spouse name: Not on file  . Number of children: Not on file  . Years of education: Not on file  . Highest education level: Not on file  Occupational History  . Not on file  Tobacco Use  . Smoking status: Former Smoker     Packs/day: 2.00    Years: 27.00    Pack years: 54.00    Types: Cigarettes    Start date: 02/01/1971    Quit date: 01/31/1998    Years since quitting: 22.8  . Smokeless tobacco: Current User    Types: Chew  Vaping Use  . Vaping Use: Never used  Substance and Sexual Activity  . Alcohol use: Yes    Alcohol/week: 0.0 standard drinks    Comment: Drinks beer daily, typically 2-4 a day. Also 4 oz red wine an evening.  . Drug use: No  . Sexual activity: Not on file  Other Topics Concern  . Not on file  Social History Narrative  . Not on file   Social Determinants of Health   Financial Resource Strain: Not on file  Food Insecurity: Not on file  Transportation Needs: Not on file  Physical Activity: Not on file  Stress: Not on file  Social Connections: Not on file       Review of Systems: Gen: Denies fever, chills,  cold or flulike symptoms.  CV: See HPI Resp: See HPI. GI: see HPI Derm: Denies rash Heme: See HPI  Observations/Objective: No distress. Alert and oriented. Pleasant. Unable to perform complete physical exam due to telephone encounter. No video available.   Assessment and Plan: 69 year old male with history of adenomatous colon polyps presenting today to discuss scheduling surveillance colonoscopy as well as new onset epigastric abdominal pain x4 weeks.   History of adenomatous colon polyps: Tubulovillous adenoma in 2006. Last colonoscopy in 2016 for hematochezia revealing internal hemorrhoids-likely source of hematochezia, otherwise normal exam. Recommended repeat colonoscopy in 5 years. He continues to have intermittent low-volume rectal bleeding with no significant change. No other significant lower GI symptoms. No family history of colon cancer.  We will plan to proceed with colonoscopy with propofol with Dr. Gala Romney in the near future. The risks, benefits, and alternatives have been discussed with the patient in detail. The patient states understanding and desires  to proceed.  ASA III  Epigastric pain: New onset of epigastric abdominal pain that started 4 weeks ago with associated decreased appetite. Denies nausea, vomiting, or GERD symptoms. Overall, symptoms have significantly improved, but he continues to have mild epigastric discomfort after meals along with belching. Symptoms improved with Tums. Chronically on 81 mg aspirin but no other NSAIDs. Denies true melena, but reports occasional dark spots in the stools. Query whether he may have NSAID induced gastritis or possible H. pylori. Cannot rule out PUD.   Offered EGD, but patient declined. He was advised to hold all acid suppression medications for the next 14 days, then complete H. pylori breath test. If positive, we will treat. If negative, we will start PPI daily. Also discussed dietary changes including avoiding fried, fatty, greasy, spicy, citrus foods, chocolate, caffeine, carbonated beverages.   Follow Up Instructions: Follow-up after colonoscopy.   I discussed the assessment and treatment plan with the patient. The patient was provided an opportunity to ask questions and all were answered. The patient agreed with the plan and demonstrated an understanding of the instructions.   The patient was advised to call back or seek an in-person evaluation if the symptoms worsen or if the condition fails to improve as anticipated.  I provided 13 minutes of non-face-to-face time during this encounter.  Aliene Altes, PA-C [facility]

## 2020-11-14 ENCOUNTER — Ambulatory Visit (INDEPENDENT_AMBULATORY_CARE_PROVIDER_SITE_OTHER): Payer: Medicare Other | Admitting: Gastroenterology

## 2020-11-14 ENCOUNTER — Encounter: Payer: Self-pay | Admitting: Gastroenterology

## 2020-11-14 ENCOUNTER — Telehealth: Payer: Self-pay

## 2020-11-14 DIAGNOSIS — R1013 Epigastric pain: Secondary | ICD-10-CM | POA: Diagnosis not present

## 2020-11-14 DIAGNOSIS — Z86010 Personal history of colonic polyps: Secondary | ICD-10-CM

## 2020-11-14 NOTE — Telephone Encounter (Signed)
Tried to call pt to schedule TCS w/propofol w/Dr. Gala Romney ASA 3, someone answered and said he was unavailable.

## 2020-11-14 NOTE — Progress Notes (Signed)
Cc'ed to pcp °

## 2020-11-14 NOTE — Patient Instructions (Signed)
We will get you scheduled for a colonoscopy in the near future with Dr. Gala Romney.  Please hold all acid suppression medications for the next 14 days, then complete H. pylori breath test at [facility]. This should be January 6th or after.   To help with your upper abdominal discomfort: Avoid fried, fatty, greasy, spicy, citrus foods. Avoid caffeine and carbonated beverages. Avoid chocolate.  We will call you with your H. pylori test results and have further recommendations at that time.  We will see you back in the office after your colonoscopy. Do not hesitate to call if you have questions or concerns prior.  Dennis Altes, PA-C [facility]

## 2020-11-15 ENCOUNTER — Telehealth: Payer: Self-pay | Admitting: *Deleted

## 2020-11-15 NOTE — Telephone Encounter (Signed)
Pt consented to a telephone visit on 11/14/2020.

## 2020-11-15 NOTE — Telephone Encounter (Signed)
Dennis [name], you are scheduled for a virtual visit with your provider today.  Just as we do with appointments in the office, we must obtain your consent to participate.  Your consent will be active for this visit and any virtual visit you may have with one of our providers in the next 365 days.  If you have a MyChart account, I can also send a copy of this consent to you electronically.  All virtual visits are billed to your insurance company just like a traditional visit in the office.  As this is a virtual visit, video technology does not allow for your provider to perform a traditional examination.  This may limit your provider's ability to fully assess your condition.  If your provider identifies any concerns that need to be evaluated in person or the need to arrange testing such as labs, EKG, etc, we will make arrangements to do so.  Although advances in technology are sophisticated, we cannot ensure that it will always work on either your end or our end.  If the connection with a video visit is poor, we may have to switch to a telephone visit.  With either a video or telephone visit, we are not always able to ensure that we have a secure connection.   I need to obtain your verbal consent now.   Are you willing to proceed with your visit today?

## 2020-11-20 ENCOUNTER — Telehealth: Payer: Self-pay

## 2020-11-20 ENCOUNTER — Other Ambulatory Visit: Payer: Self-pay

## 2020-11-20 DIAGNOSIS — I10 Essential (primary) hypertension: Secondary | ICD-10-CM

## 2020-11-20 MED ORDER — LOSARTAN POTASSIUM-HCTZ 100-25 MG PO TABS: 1.0000 | ORAL_TABLET | Freq: Every day | ORAL | 0 refills | Status: DC

## 2020-11-20 MED ORDER — METOPROLOL TARTRATE 50 MG PO TABS: 50.0000 mg | ORAL_TABLET | Freq: Two times a day (BID) | ORAL | 1 refills | Status: DC

## 2020-11-20 NOTE — Telephone Encounter (Signed)
Please refill Metoprolol Tartrate & Losartan  [facility] to [facility]   If there is any question please reach out to the pt. Thanks

## 2020-11-20 NOTE — Telephone Encounter (Signed)
Medications refilled, left message for patient.

## 2020-11-21 NOTE — Telephone Encounter (Signed)
Letter mailed

## 2020-11-29 ENCOUNTER — Other Ambulatory Visit: Payer: Self-pay | Admitting: *Deleted

## 2020-11-29 ENCOUNTER — Telehealth: Payer: Self-pay | Admitting: Internal Medicine

## 2020-11-29 ENCOUNTER — Telehealth: Payer: Self-pay

## 2020-11-29 MED ORDER — FENOFIBRATE 160 MG PO TABS
160.0000 mg | ORAL_TABLET | Freq: Every day | ORAL | 1 refills | Status: DC
Start: 2020-11-29 — End: 2021-05-20

## 2020-11-29 NOTE — Telephone Encounter (Signed)
Noted. Routing to Ermalinda Memos, [place] as and [address]

## 2020-11-29 NOTE — Telephone Encounter (Signed)
Sent in to pt pharmacy

## 2020-11-29 NOTE — Telephone Encounter (Signed)
Patient called and said that he wants his procedure scheduled until march and he will go next week to have his labs because this week he feels bad

## 2020-11-29 NOTE — Telephone Encounter (Signed)
Noted.  Spoke with patient.  He is currently dealing with sinus congestion at this time.  He is starting to feel better.  No fever or worsening shortness of breath.  Considering another picking Covid cases, he prefers to have procedure scheduled in March.  No significant GI symptoms at this time.  Plans for H. pylori breath test next week.  [facility] Clinical Pool: Please reach out to patient in March to schedule colonoscopy.

## 2020-11-29 NOTE — Telephone Encounter (Signed)
   fenofibrate 160 MG tablet, please send this in to the pharmacy  If there is any questions or problems... please call the pt.  [phone]

## 2020-11-29 NOTE — Telephone Encounter (Signed)
noted 

## 2020-12-03 ENCOUNTER — Telehealth: Payer: Self-pay | Admitting: Internal Medicine

## 2020-12-03 NOTE — Telephone Encounter (Signed)
Spoke with pt. Pt is taking Prednisone, and Pepcid and wanted to know if he could take the H. Pylori breath test. Pt is aware that he can complete the H. Pylori breath test.

## 2020-12-03 NOTE — Telephone Encounter (Signed)
Pt wants to go over his medications with the nurse. [phone]

## 2020-12-16 ENCOUNTER — Other Ambulatory Visit: Payer: Self-pay | Admitting: Family Medicine

## 2020-12-18 ENCOUNTER — Other Ambulatory Visit: Payer: Self-pay | Admitting: *Deleted

## 2020-12-18 DIAGNOSIS — I10 Essential (primary) hypertension: Secondary | ICD-10-CM

## 2020-12-18 MED ORDER — LOSARTAN POTASSIUM-HCTZ 100-25 MG PO TABS: 1.0000 | ORAL_TABLET | Freq: Every day | ORAL | 0 refills | Status: DC

## 2020-12-19 ENCOUNTER — Other Ambulatory Visit: Payer: Self-pay | Admitting: Family Medicine

## 2020-12-19 ENCOUNTER — Other Ambulatory Visit: Payer: Self-pay | Admitting: Internal Medicine

## 2020-12-19 DIAGNOSIS — N401 Enlarged prostate with lower urinary tract symptoms: Secondary | ICD-10-CM

## 2020-12-19 NOTE — Telephone Encounter (Signed)
Called pt. He has been scheduled for 3/7, am appt. He is aware hospital will call him to let him know what time to arrive for procedure. Advised will mail prep instructions with pre-op/covid test appt. Confirmed mailing address.

## 2020-12-20 ENCOUNTER — Encounter: Payer: Self-pay | Admitting: *Deleted

## 2020-12-24 ENCOUNTER — Telehealth: Payer: Self-pay

## 2020-12-24 ENCOUNTER — Other Ambulatory Visit: Payer: Self-pay | Admitting: Internal Medicine

## 2020-12-24 DIAGNOSIS — R7301 Impaired fasting glucose: Secondary | ICD-10-CM

## 2020-12-24 DIAGNOSIS — I10 Essential (primary) hypertension: Secondary | ICD-10-CM

## 2020-12-24 DIAGNOSIS — E785 Hyperlipidemia, unspecified: Secondary | ICD-10-CM

## 2020-12-24 NOTE — Telephone Encounter (Signed)
Blood tests ordered. Thanks.

## 2020-12-24 NOTE — Telephone Encounter (Signed)
Patient called and would like blood work ordered. Patient has phone visit 2/9.

## 2020-12-24 NOTE — Telephone Encounter (Signed)
What lab orders would you like for pt

## 2020-12-24 NOTE — Telephone Encounter (Signed)
Pt notified with verbal understanding  °

## 2020-12-26 ENCOUNTER — Other Ambulatory Visit: Payer: Self-pay | Admitting: *Deleted

## 2020-12-26 DIAGNOSIS — R350 Frequency of micturition: Secondary | ICD-10-CM

## 2020-12-26 DIAGNOSIS — E785 Hyperlipidemia, unspecified: Secondary | ICD-10-CM | POA: Diagnosis not present

## 2020-12-26 DIAGNOSIS — Z125 Encounter for screening for malignant neoplasm of prostate: Secondary | ICD-10-CM

## 2020-12-26 DIAGNOSIS — I10 Essential (primary) hypertension: Secondary | ICD-10-CM | POA: Diagnosis not present

## 2020-12-26 DIAGNOSIS — I2510 Atherosclerotic heart disease of native coronary artery without angina pectoris: Secondary | ICD-10-CM | POA: Diagnosis not present

## 2020-12-26 DIAGNOSIS — R7301 Impaired fasting glucose: Secondary | ICD-10-CM | POA: Diagnosis not present

## 2020-12-26 DIAGNOSIS — N401 Enlarged prostate with lower urinary tract symptoms: Secondary | ICD-10-CM | POA: Diagnosis not present

## 2020-12-26 NOTE — Progress Notes (Signed)
hepaticv

## 2020-12-27 LAB — CBC WITH DIFFERENTIAL/PLATELET
Basophils Absolute: 0.1 10*3/uL (ref 0.0–0.2)
Basos: 1 %
EOS (ABSOLUTE): 0.2 10*3/uL (ref 0.0–0.4)
Eos: 3 %
Hematocrit: 44 % (ref 37.5–51.0)
Hemoglobin: 14.6 g/dL (ref 13.0–17.7)
Immature Grans (Abs): 0 10*3/uL (ref 0.0–0.1)
Immature Granulocytes: 0 %
Lymphocytes Absolute: 2.6 10*3/uL (ref 0.7–3.1)
Lymphs: 40 %
MCH: 31.2 pg (ref 26.6–33.0)
MCHC: 33.2 g/dL (ref 31.5–35.7)
MCV: 94 fL (ref 79–97)
Monocytes Absolute: 0.9 10*3/uL (ref 0.1–0.9)
Monocytes: 14 %
Neutrophils Absolute: 2.7 10*3/uL (ref 1.4–7.0)
Neutrophils: 42 %
Platelets: 150 10*3/uL (ref 150–450)
RBC: 4.68 x10E6/uL (ref 4.14–5.80)
RDW: 12.5 % (ref 11.6–15.4)
WBC: 6.5 10*3/uL (ref 3.4–10.8)

## 2020-12-27 LAB — CMP14+EGFR
ALT: 30 IU/L (ref 0–44)
AST: 38 IU/L (ref 0–40)
Albumin/Globulin Ratio: 2 (ref 1.2–2.2)
Albumin: 4.6 g/dL (ref 3.8–4.8)
Alkaline Phosphatase: 28 IU/L — ABNORMAL LOW (ref 44–121)
BUN/Creatinine Ratio: 19 (ref 10–24)
BUN: 26 mg/dL (ref 8–27)
Bilirubin Total: 0.7 mg/dL (ref 0.0–1.2)
CO2: 26 mmol/L (ref 20–29)
Calcium: 9.9 mg/dL (ref 8.6–10.2)
Chloride: 100 mmol/L (ref 96–106)
Creatinine, Ser: 1.4 mg/dL — ABNORMAL HIGH (ref 0.76–1.27)
GFR calc Af Amer: 60 mL/min/{1.73_m2} (ref >59)
GFR calc non Af Amer: 52 mL/min/{1.73_m2} — ABNORMAL LOW (ref >59)
Globulin, Total: 2.3 g/dL (ref 1.5–4.5)
Glucose: 108 mg/dL — ABNORMAL HIGH (ref 65–99)
Potassium: 4.2 mmol/L (ref 3.5–5.2)
Sodium: 140 mmol/L (ref 134–144)
Total Protein: 6.9 g/dL (ref 6.0–8.5)

## 2020-12-27 LAB — HEPATIC FUNCTION PANEL
ALT: 28 IU/L (ref 0–44)
AST: 37 IU/L (ref 0–40)
Albumin: 4.5 g/dL (ref 3.8–4.8)
Alkaline Phosphatase: 30 IU/L — ABNORMAL LOW (ref 44–121)
Bilirubin Total: 0.7 mg/dL (ref 0.0–1.2)
Bilirubin, Direct: 0.2 mg/dL (ref 0.00–0.40)
Total Protein: 6.7 g/dL (ref 6.0–8.5)

## 2020-12-27 LAB — LIPID PANEL
Chol/HDL Ratio: 6.7 ratio — ABNORMAL HIGH (ref 0.0–5.0)
Cholesterol, Total: 280 mg/dL — ABNORMAL HIGH (ref 100–199)
HDL: 42 mg/dL (ref >39)
LDL Chol Calc (NIH): 193 mg/dL — ABNORMAL HIGH (ref 0–99)
Triglycerides: 234 mg/dL — ABNORMAL HIGH (ref 0–149)
VLDL Cholesterol Cal: 45 mg/dL — ABNORMAL HIGH (ref 5–40)

## 2020-12-27 LAB — TSH: TSH: 2.83 u[IU]/mL (ref 0.450–4.500)

## 2020-12-27 LAB — PSA: Prostate Specific Ag, Serum: 1 ng/mL (ref 0.0–4.0)

## 2020-12-27 LAB — HEMOGLOBIN A1C
Est. average glucose Bld gHb Est-mCnc: 117 mg/dL
Hgb A1c MFr Bld: 5.7 % — ABNORMAL HIGH (ref 4.8–5.6)

## 2021-01-02 ENCOUNTER — Telehealth (INDEPENDENT_AMBULATORY_CARE_PROVIDER_SITE_OTHER): Payer: Medicare Other | Admitting: Internal Medicine

## 2021-01-02 ENCOUNTER — Other Ambulatory Visit: Payer: Self-pay

## 2021-01-02 ENCOUNTER — Encounter: Payer: Self-pay | Admitting: Internal Medicine

## 2021-01-02 DIAGNOSIS — N1832 Chronic kidney disease, stage 3b: Secondary | ICD-10-CM | POA: Insufficient documentation

## 2021-01-02 DIAGNOSIS — I10 Essential (primary) hypertension: Secondary | ICD-10-CM | POA: Diagnosis not present

## 2021-01-02 DIAGNOSIS — I483 Typical atrial flutter: Secondary | ICD-10-CM

## 2021-01-02 DIAGNOSIS — N1831 Chronic kidney disease, stage 3a: Secondary | ICD-10-CM | POA: Diagnosis not present

## 2021-01-02 DIAGNOSIS — L089 Local infection of the skin and subcutaneous tissue, unspecified: Secondary | ICD-10-CM | POA: Diagnosis not present

## 2021-01-02 DIAGNOSIS — B9689 Other specified bacterial agents as the cause of diseases classified elsewhere: Secondary | ICD-10-CM | POA: Diagnosis not present

## 2021-01-02 DIAGNOSIS — R1013 Epigastric pain: Secondary | ICD-10-CM

## 2021-01-02 DIAGNOSIS — E785 Hyperlipidemia, unspecified: Secondary | ICD-10-CM | POA: Diagnosis not present

## 2021-01-02 MED ORDER — MUPIROCIN 2 % EX OINT
1.0000 | TOPICAL_OINTMENT | Freq: Two times a day (BID) | CUTANEOUS | 0 refills | Status: DC
Start: 2021-01-02 — End: 2023-02-26

## 2021-01-02 NOTE — Assessment & Plan Note (Signed)
BP Readings from Last 1 Encounters:  09/25/20 133/77   Well-controlled with Hyzaar and Metoprolol Counseled for compliance with the medications Advised DASH diet and moderate exercise/walking, at least 150 mins/week

## 2021-01-02 NOTE — Assessment & Plan Note (Signed)
Last lipid profile reviewed On Vytorin and Fenofibrate Advised for PCSK9 inhibitors, denies for now. Also offered referral to lipid clinic, denies for now.

## 2021-01-02 NOTE — Patient Instructions (Signed)
Please continue to take medications regularly.  Please follow DASH diet and perform moderate exercise/walking at least 150 mins/week.  Please let us know if you would consider injectable treatment for cholesterol.  Please avoid taking tums 2 weeks before the breath test.

## 2021-01-02 NOTE — Assessment & Plan Note (Signed)
CMP reviewed S. Cr. 1.4, was 1.35 in the last CMP Will monitor for now On ARB Advised for proper hydration Avoid nephrotoxic agents

## 2021-01-02 NOTE — Assessment & Plan Note (Signed)
S/p cardiac ablation In sinus rhythm currently On Metoprolol

## 2021-01-02 NOTE — Progress Notes (Signed)
Virtual Visit via Telephone Note   This visit type was conducted due to national recommendations for restrictions regarding the COVID-19 Pandemic (e.g. social distancing) in an effort to limit this patient's exposure and mitigate transmission in our community.  Due to his co-morbid illnesses, this patient is at least at moderate risk for complications without adequate follow up.  This format is felt to be most appropriate for this patient at this time.  The patient did not have access to video technology/had technical difficulties with video requiring transitioning to audio format only (telephone).  All issues noted in this document were discussed and addressed.  No physical exam could be performed with this format.  Evaluation Performed:  Follow-up visit  Date:  01/02/2021   ID:  Hutchinson, Isenberg [birth year], MRN [id number]  Patient Location: Home Provider Location: Office/Clinic  Participants: Patient Location of Patient: Home Location of Provider: Telehealth Consent was obtain for visit to be over via telehealth. I verified that I am speaking with the correct person using two identifiers.  PCP:  Lindell Spar, MD   Chief Complaint:  Follow up of chronic medical conditions  History of Present Illness:    Dennis [name] is a 70 y.o. male with PMH of CAD s/p CABG, HTN, HLD, COPD, anxiety and insomnia who has a televisit for follow up of his chronic medical conditions and blood tests review.  He has been having epigastric pain and was having less appetite. He was scheduled for H Pylori testing and GI follow up for colonoscopy, but could not follow up as scheduled. He had about 10 lbs weight loss due to excessive nausea and loss of appetite, but states that his symptoms are better now and weight has been stable now. Denies diarrhea, melena or hematochezia. He has been taking Tums for GERD, advised him to avoid taking tums for 2 weeks before H Pylori breath test.  His BP has been  running around 130s/70s. BP is well-controlled. Takes medications regularly. Patient denies headache, dizziness, chest pain, dyspnea or palpitations.  Blood tests were reviewed and discussed with patient in detail. Had lengthy discussion about cholesterol management, but patient wants to stay with Vytorin for now.  Patient mentions having skin cuts around the nose area, for which he was prescribed Mupirocin in the past. He requests a refill of it.  The patient does not have symptoms concerning for COVID-19 infection (fever, chills, cough, or new shortness of breath).   Past Medical, Surgical, Social History, Allergies, and Medications have been Reviewed.  Past Medical History:  Diagnosis Date  . Atrial flutter ([place])    Diagnosed by ECG August 2015  . Colitis   . COPD (chronic obstructive pulmonary disease) ([place])   . Coronary atherosclerosis of native coronary artery    Multivessel status post CABG  . Essential hypertension   . Hyperlipidemia   . Insomnia    Past Surgical History:  Procedure Laterality Date  . COLONOSCOPY  01/29/2005   Dr. Gala Romney; rectal polyp s/p polypectomy, otherwise normal.  Pathology with tubulovillous adenoma.  . COLONOSCOPY  02/06/2008   Dr. Gala Romney; minimal internal hemorrhoids, diminutive polyp in the splenic flexure s/p cold biopsy removal, otherwise normal.  Pathology with benign polypoid colonic mucosa.  . COLONOSCOPY WITH PROPOFOL N/A 08/30/2015   Procedure: COLONOSCOPY WITH PROPOFOL at cecum at 0814; withdrawal time=8 minutes;  Surgeon: Daneil Dolin, MD; internal hemorrhoids-likely source of hematochezia, otherwise normal exam.  Repeat in 5 years.  . CORONARY  ARTERY BYPASS GRAFT  2005   Dr. Lucianne Lei Trigt: LIMA to LAD, right radial to circumflex, SVG to RCA  . CORONARY ARTERY BYPASS GRAFT  10/16/2004  . ELECTROPHYSIOLOGIC STUDY N/A 11/21/2016   Procedure: A-Flutter Ablation;  Surgeon: Evans Lance, MD;  Location: [facility];  Service: Cardiovascular;   Laterality: N/A;  . TEE WITHOUT CARDIOVERSION N/A 11/21/2016   Procedure: TRANSESOPHAGEAL ECHOCARDIOGRAM (TEE);  Surgeon: Sanda Klein, MD;  Location: [facility] ENDOSCOPY;  Service: Cardiovascular;  Laterality: N/A;     Current Meds  Medication Sig  . albuterol (PROVENTIL) (2.5 MG/3ML) 0.083% nebulizer solution Take 3 mLs (2.5 mg total) by nebulization every 6 (six) hours as needed for wheezing or shortness of breath.  Marland Kitchen albuterol (VENTOLIN HFA) 108 (90 Base) MCG/ACT inhaler Inhale 2 puffs into the lungs every 4 (four) hours as needed for wheezing.  Marland Kitchen aspirin EC 81 MG tablet Take 1 tablet (81 mg total) by mouth daily.  . budesonide-formoterol (SYMBICORT) 160-4.5 MCG/ACT inhaler TAKE 2 PUFFS FIRST THING IN MORNING AND THEN ANOTHER 2 PUFFS ABOUT 12 HOURS LATER.  Marland Kitchen ezetimibe-simvastatin (VYTORIN) 10-40 MG tablet TAKE 1 TABLET BY MOUTH EVERYDAY AT BEDTIME  . fenofibrate 160 MG tablet Take 1 tablet (160 mg total) by mouth daily.  . fish oil-omega-3 fatty acids 1000 MG capsule Take 2 g by mouth daily.  Marland Kitchen losartan-hydrochlorothiazide (HYZAAR) 100-25 MG tablet Take 1 tablet by mouth daily.  . metoprolol tartrate (LOPRESSOR) 50 MG tablet Take 1 tablet (50 mg total) by mouth 2 (two) times daily.  . mupirocin ointment (BACTROBAN) 2 % Apply 1 application topically 2 (two) times daily.  . nitroGLYCERIN (NITROSTAT) 0.4 MG SL tablet Place 1 tablet (0.4 mg total) under the tongue every 5 (five) minutes as needed.  . tamsulosin (FLOMAX) 0.4 MG CAPS capsule TAKE 1 CAPSULE BY MOUTH EVERY DAY     Allergies:   Ace inhibitors, Enalapril, Clotrimazole-betamethasone, and Prednisone   ROS:   Please see the history of present illness.    Review of Systems  Constitutional: Negative for chills and fever.  HENT: Negative for sinus pain and sore throat.   Eyes: Negative for pain and redness.  Respiratory: Negative for cough, sputum production and shortness of breath.   Cardiovascular: Negative for chest pain and  palpitations.  Gastrointestinal: Negative for constipation, diarrhea, nausea and vomiting.  Genitourinary: Negative for dysuria and hematuria.  Musculoskeletal: Negative for falls.  Neurological: Negative for dizziness, tingling, sensory change and focal weakness.  Psychiatric/Behavioral: Negative for depression. The patient is nervous/anxious and has insomnia.      Labs/Other Tests and Data Reviewed:    Recent Labs: 12/26/2020: ALT 30; ALT 28; BUN 26; Creatinine, Ser 1.40; Hemoglobin 14.6; Platelets 150; Potassium 4.2; Sodium 140; TSH 2.830   Recent Lipid Panel Lab Results  Component Value Date/Time   CHOL 280 (H) 12/26/2020 09:16 AM   TRIG 234 (H) 12/26/2020 09:16 AM   HDL 42 12/26/2020 09:16 AM   CHOLHDL 6.7 (H) 12/26/2020 09:16 AM   CHOLHDL 9.4 (H) 01/11/2018 08:33 AM   LDLCALC 193 (H) 12/26/2020 09:16 AM   LDLCALC  01/11/2018 08:33 AM     Comment:     . LDL cholesterol not calculated. Triglyceride levels greater than 400 mg/dL invalidate calculated LDL results. . Reference range: <100 . Desirable range <100 mg/dL for primary prevention;   <70 mg/dL for patients with CHD or diabetic patients  with > or = 2 CHD risk factors. Marland Kitchen LDL-C is now calculated using  the Martin-Hopkins  calculation, which is a validated novel method providing  better accuracy than the Friedewald equation in the  estimation of LDL-C.  Cresenciano Genre et al. Annamaria Helling. 8372;902(11): 2061-2068  (http://education.[employer].com/faq/FAQ164)     Wt Readings from Last 3 Encounters:  09/25/20 218 lb (98.9 kg)  07/23/20 224 lb (101.6 kg)  03/25/20 222 lb 0.1 oz (100.7 kg)     ASSESSMENT & PLAN:    Essential hypertension, benign BP Readings from Last 1 Encounters:  09/25/20 133/77   Well-controlled with Hyzaar and Metoprolol Counseled for compliance with the medications Advised DASH diet and moderate exercise/walking, at least 150 mins/week   Atrial flutter ([facility]) S/p cardiac ablation In sinus  rhythm currently On Metoprolol  Hyperlipidemia LDL goal <100 Last lipid profile reviewed On Vytorin and Fenofibrate Advised for PCSK9 inhibitors, denies for now. Also offered referral to lipid clinic, denies for now.  Stage 3a chronic kidney disease ([facility]) CMP reviewed S. Cr. 1.4, was 1.35 in the last CMP Will monitor for now On ARB Advised for proper hydration Avoid nephrotoxic agents  Epigastric pain Needs to get H Pylori breath test Avoid PPI or tums 2 weeks before the test Follow up with GI  Skin infection Refilled Mupirocin ointment    Time:   Today, I have spent 23 minutes reviewing the chart, including problem list, medications, and with the patient with telehealth technology discussing the above problems.   Medication Adjustments/Labs and Tests Ordered: Current medicines are reviewed at length with the patient today.  Concerns regarding medicines are outlined above.   Tests Ordered: No orders of the defined types were placed in this encounter.   Medication Changes: Meds ordered this encounter  Medications  . mupirocin ointment (BACTROBAN) 2 %    Sig: Apply 1 application topically 2 (two) times daily.    Dispense:  22 g    Refill:  0     Note: This dictation was prepared with Dragon dictation along with smaller phrase technology. Similar sounding words can be transcribed inadequately or may not be corrected upon review. Any transcriptional errors that result from this process are unintentional.      Disposition:  Follow up  Signed, Lindell Spar, MD  01/02/2021 10:34 AM     [place]

## 2021-01-18 ENCOUNTER — Other Ambulatory Visit: Payer: Self-pay | Admitting: Internal Medicine

## 2021-01-18 DIAGNOSIS — E785 Hyperlipidemia, unspecified: Secondary | ICD-10-CM

## 2021-01-23 NOTE — Patient Instructions (Signed)
Dennis [name]  01/23/2021     @PREFPERIOPPHARMACY @   Your procedure is scheduled on  01/28/2021   Report to [facility] at  [place]M.   Call this number if you have problems the morning of surgery:  [phone]   Remember:  Follow the diet and prep instructions given to you by the office.                       Take these medicines the morning of surgery with A SIP OF WATER  Metoprolol, flomax.   Use your nebulizer and your inhalers before you come and bring your rescue inhaler with you.    Please brush your teeth.  Do not wear jewelry, make-up or nail polish.  Do not wear lotions, powders, or perfumes, or deodorant.  Do not shave 48 hours prior to surgery.  Men may shave face and neck.  Do not bring valuables to the hospital.  [facility] is not responsible for any belongings or valuables.  Contacts, dentures or bridgework may not be worn into surgery.  Leave your suitcase in the car.  After surgery it may be brought to your room.  For patients admitted to the hospital, discharge time will be determined by your treatment team.  Patients discharged the day of surgery will not be allowed to drive home and must have someone with them for 24 hours.     Special instructions:  DO NOT smoke tobacco or vape the morning of your procedure.  Please read over the following fact sheets that you were given. Anesthesia Post-op Instructions and Care and Recovery After Surgery       Colonoscopy, Adult, Care After This sheet gives you information about how to care for yourself after your procedure. Your health care provider may also give you more specific instructions. If you have problems or questions, contact your health care provider. What can I expect after the procedure? After the procedure, it is common to have:  A small amount of blood in your stool for 24 hours after the procedure.  Some gas.  Mild cramping or bloating of your abdomen. Follow these  instructions at home: Eating and drinking  Drink enough fluid to keep your urine pale yellow.  Follow instructions from your health care provider about eating or drinking restrictions.  Resume your normal diet as instructed by your health care provider. Avoid heavy or fried foods that are hard to digest.   Activity  Rest as told by your health care provider.  Avoid sitting for a long time without moving. Get up to take short walks every 1-2 hours. This is important to improve blood flow and breathing. Ask for help if you feel weak or unsteady.  Return to your normal activities as told by your health care provider. Ask your health care provider what activities are safe for you. Managing cramping and bloating  Try walking around when you have cramps or feel bloated.  Apply heat to your abdomen as told by your health care provider. Use the heat source that your health care provider recommends, such as a moist heat pack or a heating pad. ? Place a towel between your skin and the heat source. ? Leave the heat on for 20-30 minutes. ? Remove the heat if your skin turns bright red. This is especially important if you are unable to feel pain, heat, or cold. You may have a greater risk of  getting burned.   General instructions  If you were given a sedative during the procedure, it can affect you for several hours. Do not drive or operate machinery until your health care provider says that it is safe.  For the first 24 hours after the procedure: ? Do not sign important documents. ? Do not drink alcohol. ? Do your regular daily activities at a slower pace than normal. ? Eat soft foods that are easy to digest.  Take over-the-counter and prescription medicines only as told by your health care provider.  Keep all follow-up visits as told by your health care provider. This is important. Contact a health care provider if:  You have blood in your stool 2-3 days after the procedure. Get help  right away if you have:  More than a small spotting of blood in your stool.  Large blood clots in your stool.  Swelling of your abdomen.  Nausea or vomiting.  A fever.  Increasing pain in your abdomen that is not relieved with medicine. Summary  After the procedure, it is common to have a small amount of blood in your stool. You may also have mild cramping and bloating of your abdomen.  If you were given a sedative during the procedure, it can affect you for several hours. Do not drive or operate machinery until your health care provider says that it is safe.  Get help right away if you have a lot of blood in your stool, nausea or vomiting, a fever, or increased pain in your abdomen. This information is not intended to replace advice given to you by your health care provider. Make sure you discuss any questions you have with your health care provider. Document Revised: 11/04/2019 Document Reviewed: 06/06/2019 Elsevier Patient Education  2021 [place] After This sheet gives you information about how to care for yourself after your procedure. Your health care provider may also give you more specific instructions. If you have problems or questions, contact your health care provider. What can I expect after the procedure? After the procedure, it is common to have:  Tiredness.  Forgetfulness about what happened after the procedure.  Impaired judgment for important decisions.  Nausea or vomiting.  Some difficulty with balance. Follow these instructions at home: For the time period you were told by your health care provider:  Rest as needed.  Do not participate in activities where you could fall or become injured.  Do not drive or use machinery.  Do not drink alcohol.  Do not take sleeping pills or medicines that cause drowsiness.  Do not make important decisions or sign legal documents.  Do not take care of children on your own.       Eating and drinking  Follow the diet that is recommended by your health care provider.  Drink enough fluid to keep your urine pale yellow.  If you vomit: ? Drink water, juice, or soup when you can drink without vomiting. ? Make sure you have little or no nausea before eating solid foods. General instructions  Have a responsible adult stay with you for the time you are told. It is important to have someone help care for you until you are awake and alert.  Take over-the-counter and prescription medicines only as told by your health care provider.  If you have sleep apnea, surgery and certain medicines can increase your risk for breathing problems. Follow instructions from your health care provider about wearing your sleep device: ?  Anytime you are sleeping, including during daytime naps. ? While taking prescription pain medicines, sleeping medicines, or medicines that make you drowsy.  Avoid smoking.  Keep all follow-up visits as told by your health care provider. This is important. Contact a health care provider if:  You keep feeling nauseous or you keep vomiting.  You feel light-headed.  You are still sleepy or having trouble with balance after 24 hours.  You develop a rash.  You have a fever.  You have redness or swelling around the IV site. Get help right away if:  You have trouble breathing.  You have new-onset confusion at home. Summary  For several hours after your procedure, you may feel tired. You may also be forgetful and have poor judgment.  Have a responsible adult stay with you for the time you are told. It is important to have someone help care for you until you are awake and alert.  Rest as told. Do not drive or operate machinery. Do not drink alcohol or take sleeping pills.  Get help right away if you have trouble breathing, or if you suddenly become confused. This information is not intended to replace advice given to you by your health care  provider. Make sure you discuss any questions you have with your health care provider. Document Revised: 07/26/2020 Document Reviewed: 10/13/2019 Elsevier Patient Education  2021 [employer].

## 2021-01-24 ENCOUNTER — Encounter (HOSPITAL_COMMUNITY)
Admission: RE | Admit: 2021-01-24 | Discharge: 2021-01-24 | Disposition: A | Discharge status: Home / Self Care | Payer: Medicare Other | Source: Ambulatory Visit | Attending: Internal Medicine | Admitting: Internal Medicine

## 2021-01-24 ENCOUNTER — Other Ambulatory Visit (HOSPITAL_COMMUNITY)
Admission: RE | Admit: 2021-01-24 | Discharge: 2021-01-24 | Disposition: A | Discharge status: Home / Self Care | Payer: Medicare Other | Source: Ambulatory Visit | Attending: Internal Medicine | Admitting: Internal Medicine

## 2021-01-24 ENCOUNTER — Other Ambulatory Visit (HOSPITAL_COMMUNITY): Payer: Medicare Other

## 2021-01-24 ENCOUNTER — Encounter (HOSPITAL_COMMUNITY): Payer: Self-pay

## 2021-01-24 ENCOUNTER — Other Ambulatory Visit: Payer: Self-pay

## 2021-01-24 DIAGNOSIS — Z01812 Encounter for preprocedural laboratory examination: Secondary | ICD-10-CM | POA: Diagnosis not present

## 2021-01-24 DIAGNOSIS — Z20822 Contact with and (suspected) exposure to covid-19: Secondary | ICD-10-CM | POA: Diagnosis not present

## 2021-01-24 LAB — SARS CORONAVIRUS 2 (TAT 6-24 HRS): SARS Coronavirus 2: NEGATIVE

## 2021-01-25 ENCOUNTER — Telehealth (INDEPENDENT_AMBULATORY_CARE_PROVIDER_SITE_OTHER): Payer: Medicare Other | Admitting: Internal Medicine

## 2021-01-25 ENCOUNTER — Encounter: Payer: Self-pay | Admitting: Internal Medicine

## 2021-01-25 DIAGNOSIS — S39012A Strain of muscle, fascia and tendon of lower back, initial encounter: Secondary | ICD-10-CM | POA: Diagnosis not present

## 2021-01-25 MED ORDER — TIZANIDINE HCL 4 MG PO TABS: 4.0000 mg | ORAL_TABLET | Freq: Four times a day (QID) | ORAL | 0 refills | Status: DC | PRN

## 2021-01-25 NOTE — Patient Instructions (Signed)
Please Tylenol up to 3 times in a day for back pain.  Please take Tizanidine as prescribed for muscle spasms.  Avoid lifting weight or unnecessary bending. Okay to apply heating pad or ice to help with local swelling.

## 2021-01-25 NOTE — Progress Notes (Signed)
Virtual Visit via Telephone Note   This visit type was conducted due to national recommendations for restrictions regarding the COVID-19 Pandemic (e.g. social distancing) in an effort to limit this patient's exposure and mitigate transmission in our community.  Due to his co-morbid illnesses, this patient is at least at moderate risk for complications without adequate follow up.  This format is felt to be most appropriate for this patient at this time.  The patient did not have access to video technology/had technical difficulties with video requiring transitioning to audio format only (telephone).  All issues noted in this document were discussed and addressed.  No physical exam could be performed with this format.  Please refer to the patient's chart for his  consent to telehealth for [facility].   Evaluation Performed:  Follow-up visit  Date:  01/25/2021   ID:  Dennis [name], DOB 15-May-1951, MRN [id number]  Patient Location: Home Provider Location: Office/Clinic  Location of Patient: Home Location of Provider: Telehealth Consent was obtain for visit to be over via telehealth. I verified that I am speaking with the correct person using two identifiers.  PCP:  Lindell Spar, MD   Chief Complaint:    History of Present Illness:    Dennis [name] is a 70 y.o. male with  PMH of CAD s/p CABG, HTN, HLD, COPD, anxiety and insomniawho has a televisit for c/o back pain for the last 5 days. He was helping his wife at home putting sheets on the bed. He has constant back pain, which is sharp, nonradiating. She tried taking Aleve and it helped a little bit. He went out to walk with his wife earlier in this week, which made his back pain worse. He denies any numbness, tingling or weakness of the LE.  The patient does not have symptoms concerning for COVID-19 infection (fever, chills, cough, or new shortness of breath).   Past Medical, Surgical, Social History, Allergies, and Medications  have been Reviewed.  Past Medical History:  Diagnosis Date  . Atrial flutter ([place])    Diagnosed by ECG August 2015  . Colitis   . COPD (chronic obstructive pulmonary disease) ([place])   . Coronary atherosclerosis of native coronary artery    Multivessel status post CABG  . Essential hypertension   . Hyperlipidemia   . Insomnia    Past Surgical History:  Procedure Laterality Date  . COLONOSCOPY  01/29/2005   Dr. Gala Romney; rectal polyp s/p polypectomy, otherwise normal.  Pathology with tubulovillous adenoma.  . COLONOSCOPY  02/06/2008   Dr. Gala Romney; minimal internal hemorrhoids, diminutive polyp in the splenic flexure s/p cold biopsy removal, otherwise normal.  Pathology with benign polypoid colonic mucosa.  . COLONOSCOPY WITH PROPOFOL N/A 08/30/2015   Procedure: COLONOSCOPY WITH PROPOFOL at cecum at 0814; withdrawal time=8 minutes;  Surgeon: Daneil Dolin, MD; internal hemorrhoids-likely source of hematochezia, otherwise normal exam.  Repeat in 5 years.  . CORONARY ARTERY BYPASS GRAFT  2005   Dr. Lucianne Lei Trigt: LIMA to LAD, right radial to circumflex, SVG to RCA  . CORONARY ARTERY BYPASS GRAFT  10/16/2004  . ELECTROPHYSIOLOGIC STUDY N/A 11/21/2016   Procedure: A-Flutter Ablation;  Surgeon: Evans Lance, MD;  Location: [place] [facility];  Service: Cardiovascular;  Laterality: N/A;  . TEE WITHOUT CARDIOVERSION N/A 11/21/2016   Procedure: TRANSESOPHAGEAL ECHOCARDIOGRAM (TEE);  Surgeon: Sanda Klein, MD;  Location: [facility] ENDOSCOPY;  Service: Cardiovascular;  Laterality: N/A;     Current Meds  Medication Sig  .  albuterol (PROVENTIL) (2.5 MG/3ML) 0.083% nebulizer solution Take 3 mLs (2.5 mg total) by nebulization every 6 (six) hours as needed for wheezing or shortness of breath.  Marland Kitchen albuterol (VENTOLIN HFA) 108 (90 Base) MCG/ACT inhaler Inhale 2 puffs into the lungs every 4 (four) hours as needed for wheezing.  Marland Kitchen aspirin EC 81 MG tablet Take 1 tablet (81 mg total) by mouth daily. (Patient taking  differently: Take 81 mg by mouth 2 (two) times a week.)  . budesonide-formoterol (SYMBICORT) 160-4.5 MCG/ACT inhaler TAKE 2 PUFFS FIRST THING IN MORNING AND THEN ANOTHER 2 PUFFS ABOUT 12 HOURS LATER. (Patient taking differently: Inhale 2 puffs into the lungs in the morning and at bedtime. TAKE 2 PUFFS FIRST THING IN MORNING AND THEN ANOTHER 2 PUFFS ABOUT 12 HOURS LATER.)  . ezetimibe-simvastatin (VYTORIN) 10-40 MG tablet Take 1 tablet by mouth at bedtime. TAKE 1 TABLET BY MOUTH EVERYDAY AT BEDTIME  . fenofibrate 160 MG tablet Take 1 tablet (160 mg total) by mouth daily.  . fish oil-omega-3 fatty acids 1000 MG capsule Take 1 g by mouth in the morning and at bedtime.  Marland Kitchen losartan-hydrochlorothiazide (HYZAAR) 100-25 MG tablet Take 1 tablet by mouth daily.  . metoprolol tartrate (LOPRESSOR) 50 MG tablet Take 1 tablet (50 mg total) by mouth 2 (two) times daily.  . mupirocin ointment (BACTROBAN) 2 % Apply 1 application topically 2 (two) times daily. (Patient taking differently: Apply 1 application topically 2 (two) times daily as needed (wound care).)  . nitroGLYCERIN (NITROSTAT) 0.4 MG SL tablet Place 1 tablet (0.4 mg total) under the tongue every 5 (five) minutes as needed.  . tamsulosin (FLOMAX) 0.4 MG CAPS capsule TAKE 1 CAPSULE BY MOUTH EVERY DAY (Patient taking differently: Take 0.4 mg by mouth daily.)  . zolpidem (AMBIEN) 10 MG tablet Take 10 mg by mouth at bedtime as needed for sleep.     Allergies:   Ace inhibitors, Enalapril, Clotrimazole-betamethasone, and Prednisone   ROS:   Please see the history of present illness.     All other systems reviewed and are negative.   Labs/Other Tests and Data Reviewed:    Recent Labs: 12/26/2020: ALT 30; ALT 28; BUN 26; Creatinine, Ser 1.40; Hemoglobin 14.6; Platelets 150; Potassium 4.2; Sodium 140; TSH 2.830   Recent Lipid Panel Lab Results  Component Value Date/Time   CHOL 280 (H) 12/26/2020 09:16 AM   TRIG 234 (H) 12/26/2020 09:16 AM   HDL 42  12/26/2020 09:16 AM   CHOLHDL 6.7 (H) 12/26/2020 09:16 AM   CHOLHDL 9.4 (H) 01/11/2018 08:33 AM   LDLCALC 193 (H) 12/26/2020 09:16 AM   LDLCALC  01/11/2018 08:33 AM     Comment:     . LDL cholesterol not calculated. Triglyceride levels greater than 400 mg/dL invalidate calculated LDL results. . Reference range: <100 . Desirable range <100 mg/dL for primary prevention;   <70 mg/dL for patients with CHD or diabetic patients  with > or = 2 CHD risk factors. Marland Kitchen LDL-C is now calculated using the Martin-Hopkins  calculation, which is a validated novel method providing  better accuracy than the Friedewald equation in the  estimation of LDL-C.  Cresenciano Genre et al. Annamaria Helling. 0174;944(96): 2061-2068  (http://education.[employer].com/faq/FAQ164)     Wt Readings from Last 3 Encounters:  09/25/20 218 lb (98.9 kg)  07/23/20 224 lb (101.6 kg)  03/25/20 222 lb 0.1 oz (100.7 kg)      ASSESSMENT & PLAN:    Muscle strain paraspinal muscle Low back pain, likely due  to muscle strain considering the history Tylenol as needed Zanaflex as needed for muscle spasms Avoid lifting weight or bending unnecessarily  Plan for procedure - colonoscopy after 3 days, avoid Tramadol or opioid medication to avoid any interaction with anesthetic agent  Time:   Today, I have spent 7 minutes reviewing the chart, including problem list, medications, and with the patient with telehealth technology discussing the above problems.   Medication Adjustments/Labs and Tests Ordered: Current medicines are reviewed at length with the patient today.  Concerns regarding medicines are outlined above.   Tests Ordered: No orders of the defined types were placed in this encounter.   Medication Changes: No orders of the defined types were placed in this encounter.    Note: This dictation was prepared with Dragon dictation along with smaller phrase technology. Similar sounding words can be transcribed inadequately or  may not be corrected upon review. Any transcriptional errors that result from this process are unintentional.      Disposition:  Follow up  Signed, Lindell Spar, MD  01/25/2021 9:54 AM     [facility]

## 2021-01-28 ENCOUNTER — Encounter (HOSPITAL_COMMUNITY)
Admission: RE | Disposition: A | Discharge status: Home / Self Care | Payer: Self-pay | Source: Home / Self Care | Attending: Internal Medicine

## 2021-01-28 ENCOUNTER — Encounter (HOSPITAL_COMMUNITY): Payer: Self-pay | Admitting: Internal Medicine

## 2021-01-28 ENCOUNTER — Ambulatory Visit (HOSPITAL_COMMUNITY)
Admission: RE | Admit: 2021-01-28 | Discharge: 2021-01-28 | Disposition: A | Discharge status: Home / Self Care | Payer: Medicare Other | Attending: Internal Medicine | Admitting: Internal Medicine

## 2021-01-28 ENCOUNTER — Ambulatory Visit (HOSPITAL_COMMUNITY): Payer: Medicare Other | Admitting: Anesthesiology

## 2021-01-28 DIAGNOSIS — K640 First degree hemorrhoids: Secondary | ICD-10-CM | POA: Diagnosis not present

## 2021-01-28 DIAGNOSIS — Z1211 Encounter for screening for malignant neoplasm of colon: Secondary | ICD-10-CM | POA: Insufficient documentation

## 2021-01-28 DIAGNOSIS — Z86010 Personal history of colonic polyps: Secondary | ICD-10-CM | POA: Diagnosis not present

## 2021-01-28 DIAGNOSIS — Z951 Presence of aortocoronary bypass graft: Secondary | ICD-10-CM | POA: Insufficient documentation

## 2021-01-28 DIAGNOSIS — J449 Chronic obstructive pulmonary disease, unspecified: Secondary | ICD-10-CM | POA: Diagnosis not present

## 2021-01-28 DIAGNOSIS — Z79899 Other long term (current) drug therapy: Secondary | ICD-10-CM | POA: Diagnosis not present

## 2021-01-28 DIAGNOSIS — Z7951 Long term (current) use of inhaled steroids: Secondary | ICD-10-CM | POA: Diagnosis not present

## 2021-01-28 DIAGNOSIS — K921 Melena: Secondary | ICD-10-CM | POA: Diagnosis not present

## 2021-01-28 DIAGNOSIS — Z87891 Personal history of nicotine dependence: Secondary | ICD-10-CM | POA: Insufficient documentation

## 2021-01-28 DIAGNOSIS — Z7982 Long term (current) use of aspirin: Secondary | ICD-10-CM | POA: Diagnosis not present

## 2021-01-28 HISTORY — PX: COLONOSCOPY WITH PROPOFOL: SHX5780

## 2021-01-28 SURGERY — COLONOSCOPY WITH PROPOFOL
Anesthesia: General

## 2021-01-28 MED ORDER — LACTATED RINGERS IV SOLN: INTRAVENOUS | Status: DC

## 2021-01-28 MED ORDER — EPHEDRINE SULFATE-NACL 50-0.9 MG/10ML-% IV SOSY
PREFILLED_SYRINGE | INTRAVENOUS | Status: DC | PRN
  Administered 2021-01-28: 5 mg via INTRAVENOUS
  Administered 2021-01-28: 10 mg via INTRAVENOUS

## 2021-01-28 MED ORDER — LIDOCAINE HCL (CARDIAC) PF 100 MG/5ML IV SOSY
PREFILLED_SYRINGE | INTRAVENOUS | Status: DC | PRN
  Administered 2021-01-28: 50 mg via INTRAVENOUS

## 2021-01-28 MED ORDER — EPHEDRINE 5 MG/ML INJ
INTRAVENOUS | Status: AC
  Filled 2021-01-28: qty 10

## 2021-01-28 MED ORDER — PROPOFOL 500 MG/50ML IV EMUL
INTRAVENOUS | Status: DC | PRN
  Administered 2021-01-28: 150 ug/kg/min via INTRAVENOUS

## 2021-01-28 MED ORDER — PROPOFOL 10 MG/ML IV BOLUS
INTRAVENOUS | Status: DC | PRN
  Administered 2021-01-28: 50 mg via INTRAVENOUS
  Administered 2021-01-28: 100 mg via INTRAVENOUS

## 2021-01-28 MED ORDER — STERILE WATER FOR IRRIGATION IR SOLN
Status: DC | PRN
  Administered 2021-01-28: 1.5 mL

## 2021-01-28 NOTE — Discharge Instructions (Signed)
  Colonoscopy Discharge Instructions  Read the instructions outlined below and refer to this sheet in the next few weeks. These discharge instructions provide you with general information on caring for yourself after you leave the hospital. Your doctor may also give you specific instructions. While your treatment has been planned according to the most current medical practices available, unavoidable complications occasionally occur. If you have any problems or questions after discharge, call Dr. Gala Romney at [phone]. ACTIVITY  You may resume your regular activity, but move at a slower pace for the next 24 hours.   Take frequent rest periods for the next 24 hours.   Walking will help get rid of the air and reduce the bloated feeling in your belly (abdomen).   No driving for 24 hours (because of the medicine (anesthesia) used during the test).    Do not sign any important legal documents or operate any machinery for 24 hours (because of the anesthesia used during the test).  NUTRITION  Drink plenty of fluids.   You may resume your normal diet as instructed by your doctor.   Begin with a light meal and progress to your normal diet. Heavy or fried foods are harder to digest and may make you feel sick to your stomach (nauseated).   Avoid alcoholic beverages for 24 hours or as instructed.  MEDICATIONS  You may resume your normal medications unless your doctor tells you otherwise.  WHAT YOU CAN EXPECT TODAY  Some feelings of bloating in the abdomen.   Passage of more gas than usual.   Spotting of blood in your stool or on the toilet paper.  IF YOU HAD POLYPS REMOVED DURING THE COLONOSCOPY:  No aspirin products for 7 days or as instructed.   No alcohol for 7 days or as instructed.   Eat a soft diet for the next 24 hours.  FINDING OUT THE RESULTS OF YOUR TEST Not all test results are available during your visit. If your test results are not back during the visit, make an appointment  with your caregiver to find out the results. Do not assume everything is normal if you have not heard from your caregiver or the medical facility. It is important for you to follow up on all of your test results.  SEEK IMMEDIATE MEDICAL ATTENTION IF:  You have more than a spotting of blood in your stool.   Your belly is swollen (abdominal distention).   You are nauseated or vomiting.   You have a temperature over 101.   You have abdominal pain or discomfort that is severe or gets worse throughout the day.   Repeat colonoscopy in 5 years. No polyps found today.  Hemorrhoids likely source of bleeding  Pamphlet on hemorrhoid banding provided  Turn to the office in 3 months for possible hemorrhoid banding Roseanne Kaufman)  At patient request, I called Ruger Saxer at [phone] -rolled to voicemail "not set up"

## 2021-01-28 NOTE — Anesthesia Procedure Notes (Signed)
Date/Time: 01/28/2021 8:41 AM Performed by: Orlie Dakin, CRNA Pre-anesthesia Checklist: Patient identified, Emergency Drugs available, Suction available and Patient being monitored Patient Re-evaluated:Patient Re-evaluated prior to induction Oxygen Delivery Method: Nasal cannula Induction Type: IV induction Placement Confirmation: positive ETCO2

## 2021-01-28 NOTE — Op Note (Signed)
[facility] Patient Name: Dennis [name] Procedure Date: 01/28/2021 7:38 AM MRN: [id number] Date of Birth: Jan 31, 1951 Attending MD: Norvel Richards , MD CSN: [id number] Age: 70 Admit Type: Outpatient Procedure:                Colonoscopy Indications:              High risk colon cancer surveillance: Personal                            history of colonic polyps Providers:                Norvel Richards, MD, Caprice Kluver, Raphael Gibney, Technician Referring MD:              Medicines:                Propofol per Anesthesia Complications:            No immediate complications. Estimated Blood Loss:     Estimated blood loss: none. Procedure:                Pre-Anesthesia Assessment:                           - Prior to the procedure, a History and Physical                            was performed, and patient medications and                            allergies were reviewed. The patient's tolerance of                            previous anesthesia was also reviewed. The risks                            and benefits of the procedure and the sedation                            options and risks were discussed with the patient.                            All questions were answered, and informed consent                            was obtained. Prior Anticoagulants: The patient has                            taken no previous anticoagulant or antiplatelet                            agents. ASA Grade Assessment: II - A patient with  mild systemic disease. After reviewing the risks                            and benefits, the patient was deemed in                            satisfactory condition to undergo the procedure.                           After obtaining informed consent, the colonoscope                            was passed under direct vision. Throughout the                            procedure, the patient's blood  pressure, pulse, and                            oxygen saturations were monitored continuously. The                            CF-HQ190L ([id number]) scope was introduced through                            the anus and advanced to the the cecum, identified                            by appendiceal orifice and ileocecal valve. The                            colonoscopy was performed without difficulty. The                            patient tolerated the procedure well. The ileocecal                            valve, appendiceal orifice, and rectum were                            photographed. Scope In: 8:40:16 AM Scope Out: 8:50:34 AM Scope Withdrawal Time: 0 hours 6 minutes 1 second  Total Procedure Duration: 0 hours 10 minutes 18 seconds  Findings:      The perianal and digital rectal examinations were normal.      The colon (entire examined portion) appeared normal.      The retroflexed view of the distal rectum and anal verge revealed mild       to moderate grade 1 internal hemorrhoids. . Impression:               - The entire examined colon is normal.                           - The distal rectum and anal verge are normal  except for grade 1 hemorrhoids.                           -No specimens collected. Moderate Sedation:      Moderate (conscious) sedation was personally administered by an       anesthesia professional. The following parameters were monitored: oxygen       saturation, heart rate, blood pressure, respiratory rate, EKG, adequacy       of pulmonary ventilation, and response to care. Recommendation:           - Patient has a contact number available for                            emergencies. The signs and symptoms of potential                            delayed complications were discussed with the                            patient. Return to normal activities tomorrow.                            Written discharge instructions were provided  to the                            patient.                           - Advance diet as tolerated.                           - Continue present medications.                           - Repeat colonoscopy in 5 years for surveillance.                           - Return to GI office in 3 months for consideration                            of hemorrhoid banding. Pamphlet on hemorrhoid                            banding provided today.. Procedure Code(s):        --- Professional ---                           [phone], Colonoscopy, flexible; diagnostic, including                            collection of specimen(s) by brushing or washing,                            when performed (separate procedure) Diagnosis Code(s):        --- Professional ---  Z86.010, Personal history of colonic polyps CPT copyright 2019 American Medical Association. All rights reserved. The codes documented in this report are preliminary and upon coder review may  be revised to meet current compliance requirements. Cristopher Estimable. Marykathleen Russi, MD Norvel Richards, MD 01/28/2021 8:59:38 AM This report has been signed electronically. Number of Addenda: 0

## 2021-01-28 NOTE — H&P (Signed)
@LOGO @   Primary Care Physician:  Lindell Spar, MD Primary Gastroenterologist:  Dr. Gala Romney  Pre-Procedure History & Physical: HPI:  Dennis [name] is a 70 y.o. male here for surveillance colonoscopy. History of advanced adenoma removed previously.  Paper hematochezia when he eats spicy foods.  Known internal hemorrhoids.  Past Medical History:  Diagnosis Date  . Atrial flutter ([place])    Diagnosed by ECG August 2015  . Colitis   . COPD (chronic obstructive pulmonary disease) ([place])   . Coronary atherosclerosis of native coronary artery    Multivessel status post CABG  . Essential hypertension   . Hyperlipidemia   . Insomnia     Past Surgical History:  Procedure Laterality Date  . COLONOSCOPY  01/29/2005   Dr. Gala Romney; rectal polyp s/p polypectomy, otherwise normal.  Pathology with tubulovillous adenoma.  . COLONOSCOPY  02/06/2008   Dr. Gala Romney; minimal internal hemorrhoids, diminutive polyp in the splenic flexure s/p cold biopsy removal, otherwise normal.  Pathology with benign polypoid colonic mucosa.  . COLONOSCOPY WITH PROPOFOL N/A 08/30/2015   Procedure: COLONOSCOPY WITH PROPOFOL at cecum at 0814; withdrawal time=8 minutes;  Surgeon: Daneil Dolin, MD; internal hemorrhoids-likely source of hematochezia, otherwise normal exam.  Repeat in 5 years.  . CORONARY ARTERY BYPASS GRAFT  2005   Dr. Lucianne Lei Trigt: LIMA to LAD, right radial to circumflex, SVG to RCA  . CORONARY ARTERY BYPASS GRAFT  10/16/2004  . ELECTROPHYSIOLOGIC STUDY N/A 11/21/2016   Procedure: A-Flutter Ablation;  Surgeon: Evans Lance, MD;  Location: [place] [facility];  Service: Cardiovascular;  Laterality: N/A;  . TEE WITHOUT CARDIOVERSION N/A 11/21/2016   Procedure: TRANSESOPHAGEAL ECHOCARDIOGRAM (TEE);  Surgeon: Sanda Klein, MD;  Location: [facility] ENDOSCOPY;  Service: Cardiovascular;  Laterality: N/A;    Prior to Admission medications   Medication Sig Start Date End Date Taking? Authorizing Provider   budesonide-formoterol (SYMBICORT) 160-4.5 MCG/ACT inhaler TAKE 2 PUFFS FIRST THING IN MORNING AND THEN ANOTHER 2 PUFFS ABOUT 12 HOURS LATER. Patient taking differently: Inhale 2 puffs into the lungs in the morning and at bedtime. TAKE 2 PUFFS FIRST THING IN MORNING AND THEN ANOTHER 2 PUFFS ABOUT 12 HOURS LATER. 01/16/20  Yes Mikey Kirschner, MD  ezetimibe-simvastatin (VYTORIN) 10-40 MG tablet Take 1 tablet by mouth at bedtime. TAKE 1 TABLET BY MOUTH EVERYDAY AT BEDTIME 01/18/21  Yes Lindell Spar, MD  fenofibrate 160 MG tablet Take 1 tablet (160 mg total) by mouth daily. 11/29/20  Yes Lindell Spar, MD  fish oil-omega-3 fatty acids 1000 MG capsule Take 1 g by mouth in the morning and at bedtime.   Yes [provider]  losartan-hydrochlorothiazide (HYZAAR) 100-25 MG tablet Take 1 tablet by mouth daily. 12/18/20  Yes Lindell Spar, MD  metoprolol tartrate (LOPRESSOR) 50 MG tablet Take 1 tablet (50 mg total) by mouth 2 (two) times daily. 11/20/20  Yes Lindell Spar, MD  mupirocin ointment (BACTROBAN) 2 % Apply 1 application topically 2 (two) times daily. Patient taking differently: Apply 1 application topically 2 (two) times daily as needed (wound care). 01/02/21  Yes Lindell Spar, MD  nitroGLYCERIN (NITROSTAT) 0.4 MG SL tablet Place 1 tablet (0.4 mg total) under the tongue every 5 (five) minutes as needed. 02/22/20  Yes Satira Sark, MD  tamsulosin (FLOMAX) 0.4 MG CAPS capsule TAKE 1 CAPSULE BY MOUTH EVERY DAY Patient taking differently: Take 0.4 mg by mouth daily. 12/19/20  Yes Lindell Spar, MD  tiZANidine (ZANAFLEX) 4 MG  tablet Take 1 tablet (4 mg total) by mouth every 6 (six) hours as needed for muscle spasms. 01/25/21  Yes Lindell Spar, MD  zolpidem (AMBIEN) 10 MG tablet Take 10 mg by mouth at bedtime as needed for sleep.   Yes [provider]  albuterol (PROVENTIL) (2.5 MG/3ML) 0.083% nebulizer solution Take 3 mLs (2.5 mg total) by nebulization every 6 (six) hours  as needed for wheezing or shortness of breath. 09/28/19   Mikey Kirschner, MD  albuterol (VENTOLIN HFA) 108 (90 Base) MCG/ACT inhaler Inhale 2 puffs into the lungs every 4 (four) hours as needed for wheezing. 06/09/19   Mikey Kirschner, MD  aspirin EC 81 MG tablet Take 1 tablet (81 mg total) by mouth daily. Patient taking differently: Take 81 mg by mouth 2 (two) times a week. 12/31/16   Evans Lance, MD    Allergies as of 12/19/2020 - Review Complete 11/14/2020  Allergen Reaction Noted  . Ace inhibitors Shortness Of Breath and Cough 08/22/2015  . Enalapril Cough 12/19/2015  . Clotrimazole-betamethasone Rash 07/28/2017  . Prednisone Rash 03/25/2020    Family History  Problem Relation Age of Onset  . Hypertension Mother   . Alcohol abuse Mother   . Alcohol abuse Father   . Colon cancer Neg Hx     Social History   Socioeconomic History  . Marital status: Married    Spouse name: Not on file  . Number of children: Not on file  . Years of education: Not on file  . Highest education level: Not on file  Occupational History  . Not on file  Tobacco Use  . Smoking status: Former Smoker    Packs/day: 2.00    Years: 27.00    Pack years: 54.00    Types: Cigarettes    Start date: 02/01/1971    Quit date: 01/31/1998    Years since quitting: 23.0  . Smokeless tobacco: Current User    Types: Chew  Vaping Use  . Vaping Use: Never used  Substance and Sexual Activity  . Alcohol use: Yes    Alcohol/week: 0.0 standard drinks    Comment: Drinks beer daily, typically 2-4 a day. Also 4 oz red wine an evening.  . Drug use: No  . Sexual activity: Not on file  Other Topics Concern  . Not on file  Social History Narrative  . Not on file   Social Determinants of Health   Financial Resource Strain: Not on file  Food Insecurity: Not on file  Transportation Needs: Not on file  Physical Activity: Not on file  Stress: Not on file  Social Connections: Not on file  Intimate Partner  Violence: Not on file    Review of Systems: See HPI, otherwise negative ROS  Physical Exam: BP (!) 141/77   Pulse (!) 56   Temp 97.8 F (36.6 C) (Oral)   Resp 18   Ht 6\' 1"  (1.854 m)   Wt 94.8 kg   SpO2 98%   BMI 27.57 kg/m  General:   Alert,  Well-developed, well-nourished, pleasant and cooperative in NAD Neck:  Supple; no masses or thyromegaly. No significant cervical adenopathy. Lungs:  Clear throughout to auscultation.   No wheezes, crackles, or rhonchi. No acute distress. Heart:  Regular rate and rhythm; no murmurs, clicks, rubs,  or gallops. Abdomen: Non-distended, normal bowel sounds.  Soft and nontender without appreciable mass or hepatosplenomegaly.  Pulses:  Normal pulses noted. Extremities:  Without clubbing or edema.  Impression/Plan: 70 year old  gentleman here for surveillance colonoscopy. History of advanced adenoma.  Paper hematochezia.  Known hemorrhoids.  I have offered the patient a surveillance colonoscopy today per plan.  The risks, benefits, limitations, alternatives and imponderables have been reviewed with the patient. Questions have been answered. All parties are agreeable.      Notice: This dictation was prepared with Dragon dictation along with smaller phrase technology. Any transcriptional errors that result from this process are unintentional and may not be corrected upon review.

## 2021-01-28 NOTE — Transfer of Care (Signed)
Immediate Anesthesia Transfer of Care Note  Patient: Dennis [name]  Procedure(s) Performed: COLONOSCOPY WITH PROPOFOL (N/A )  Patient Location: Short Stay  Anesthesia Type:General  Level of Consciousness: awake, alert  and oriented  Airway & Oxygen Therapy: Patient Spontanous Breathing  Post-op Assessment: Report given to RN, Post -op Vital signs reviewed and stable and Patient moving all extremities X 4  Post vital signs: Reviewed and stable  Last Vitals:  Vitals Value Taken Time  BP    Temp    Pulse    Resp    SpO2      Last Pain:  Vitals:   01/28/21 0836  TempSrc:   PainSc: 0-No pain      Patients Stated Pain Goal: 5 (25/83/46 2194)  Complications: No complications documented.

## 2021-01-28 NOTE — Anesthesia Postprocedure Evaluation (Signed)
Anesthesia Post Note  Patient: Dennis [name]  Procedure(s) Performed: COLONOSCOPY WITH PROPOFOL (N/A )  Patient location during evaluation: Phase II Anesthesia Type: General Level of consciousness: awake and alert and oriented Pain management: pain level controlled Vital Signs Assessment: post-procedure vital signs reviewed and stable Respiratory status: spontaneous breathing, nonlabored ventilation and respiratory function stable Cardiovascular status: blood pressure returned to baseline and stable Postop Assessment: no apparent nausea or vomiting Anesthetic complications: no   No complications documented.   Last Vitals:  Vitals:   01/28/21 0710 01/28/21 0855  BP: (!) 141/77 (!) 104/54  Pulse: (!) 56 (!) 56  Resp: 18 18  Temp: 36.6 C (!) 36.4 C  SpO2: 98% 100%    Last Pain:  Vitals:   01/28/21 0855  TempSrc: Oral  PainSc: 0-No pain                 Orlie Dakin

## 2021-01-28 NOTE — Anesthesia Preprocedure Evaluation (Signed)
Anesthesia Evaluation  Patient identified by MRN, date of birth, ID band Patient awake    Reviewed: Allergy & Precautions, NPO status , Patient's Chart, lab work & pertinent test results  History of Anesthesia Complications Negative for: history of anesthetic complications  Airway Mallampati: II  TM Distance: >3 FB Neck ROM: Full    Dental  (+) Dental Advisory Given, Edentulous Upper, Missing   Pulmonary shortness of breath and with exertion, COPD, former smoker,    Pulmonary exam normal breath sounds clear to auscultation       Cardiovascular Exercise Tolerance: Good hypertension, Pt. on medications + CAD and + CABG  Normal cardiovascular exam+ dysrhythmias Atrial Fibrillation  Rhythm:Regular Rate:Bradycardia  Study Conclusions   - Left ventricle: The cavity size was normal. Wall thickness was  normal. Systolic function was normal. The estimated ejection  fraction was in the range of 55% to 60%. Wall motion was normal;  there were no regional wall motion abnormalities.  - Left atrium: No evidence of thrombus in the atrial cavity or  appendage.  - Right atrium: No evidence of thrombus in the atrial cavity or  appendage.    Neuro/Psych PSYCHIATRIC DISORDERS Anxiety    GI/Hepatic negative GI ROS, (+)     substance abuse  alcohol use,   Endo/Other  negative endocrine ROS  Renal/GU Renal InsufficiencyRenal disease     Musculoskeletal Mandibular mass   Abdominal   Peds  Hematology negative hematology ROS (+)   Anesthesia Other Findings   Reproductive/Obstetrics negative OB ROS                           Anesthesia Physical Anesthesia Plan  ASA: III  Anesthesia Plan: General   Post-op Pain Management:    Induction:   PONV Risk Score and Plan: TIVA and Propofol infusion  Airway Management Planned: Nasal Cannula and Natural Airway  Additional Equipment:   Intra-op  Plan:   Post-operative Plan:   Informed Consent: I have reviewed the patients History and Physical, chart, labs and discussed the procedure including the risks, benefits and alternatives for the proposed anesthesia with the patient or authorized representative who has indicated his/her understanding and acceptance.     Dental advisory given  Plan Discussed with: CRNA and Surgeon  Anesthesia Plan Comments:         Anesthesia Quick Evaluation

## 2021-01-31 ENCOUNTER — Encounter (HOSPITAL_COMMUNITY): Payer: Self-pay | Admitting: Internal Medicine

## 2021-02-15 ENCOUNTER — Encounter: Payer: Self-pay | Admitting: Internal Medicine

## 2021-02-15 ENCOUNTER — Ambulatory Visit (INDEPENDENT_AMBULATORY_CARE_PROVIDER_SITE_OTHER): Payer: Medicare Other | Admitting: Internal Medicine

## 2021-02-15 ENCOUNTER — Other Ambulatory Visit: Payer: Self-pay

## 2021-02-15 VITALS — BP 156/73 | HR 62 | Resp 15 | Ht 73.0 in | Wt 216.0 lb

## 2021-02-15 DIAGNOSIS — B029 Zoster without complications: Secondary | ICD-10-CM | POA: Diagnosis not present

## 2021-02-15 DIAGNOSIS — L309 Dermatitis, unspecified: Secondary | ICD-10-CM | POA: Diagnosis not present

## 2021-02-15 DIAGNOSIS — G4700 Insomnia, unspecified: Secondary | ICD-10-CM | POA: Diagnosis not present

## 2021-02-15 DIAGNOSIS — I10 Essential (primary) hypertension: Secondary | ICD-10-CM | POA: Diagnosis not present

## 2021-02-15 MED ORDER — ZOLPIDEM TARTRATE 10 MG PO TABS
10.0000 mg | ORAL_TABLET | Freq: Every evening | ORAL | 3 refills | Status: DC | PRN
Start: 2021-03-18 — End: 2021-07-15

## 2021-02-15 NOTE — Assessment & Plan Note (Signed)
Elevated today, but remains stable at home Continue Losartan-HCTZ and Metoprolol

## 2021-02-15 NOTE — Progress Notes (Signed)
Acute Office Visit  Subjective:    Patient ID: Dennis [name], male    DOB: [birth year], 70 y.o.   MRN: [id number]  Chief Complaint  Patient presents with  . Rash    On ribcage about 3 months ago, itches sometimes but doesn't hurt   . 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 Rash started as small vesicles with surrounding redness, which appear to be crusted now. He denies any burning pain, but itches occasionally.  He has been having rash over scrotal area, which has been itching. He has tried applying Clobatesol cream, which has been helping with the rash for now. He had itching in the groin area as well, which has resolved now.  His BP was elevated in the office today, but he checks his BP at home and has been around 110s-120s/70s. He denies any headache, dizziness, chest pain, dyspnea or palpitations.  Past Medical History:  Diagnosis Date  . Atrial flutter ([place])    Diagnosed by ECG August 2015  . Colitis   . COPD (chronic obstructive pulmonary disease) ([place])   . Coronary atherosclerosis of native coronary artery    Multivessel status post CABG  . Essential hypertension   . Hyperlipidemia   . Insomnia     Past Surgical History:  Procedure Laterality Date  . COLONOSCOPY  01/29/2005   Dr. Gala Romney; rectal polyp s/p polypectomy, otherwise normal.  Pathology with tubulovillous adenoma.  . COLONOSCOPY  02/06/2008   Dr. Gala Romney; minimal internal hemorrhoids, diminutive polyp in the splenic flexure s/p cold biopsy removal, otherwise normal.  Pathology with benign polypoid colonic mucosa.  . COLONOSCOPY WITH PROPOFOL N/A 08/30/2015   Procedure: COLONOSCOPY WITH PROPOFOL at cecum at 0814; withdrawal time=8 minutes;  Surgeon: Daneil Dolin, MD; internal hemorrhoids-likely source of hematochezia,  otherwise normal exam.  Repeat in 5 years.  . COLONOSCOPY WITH PROPOFOL N/A 01/28/2021   Procedure: COLONOSCOPY WITH PROPOFOL;  Surgeon: Daneil Dolin, MD;  Location: [facility];  Service: Endoscopy;  Laterality: N/A;  am appt  . CORONARY ARTERY BYPASS GRAFT  2005   Dr. Lucianne Lei Trigt: LIMA to LAD, right radial to circumflex, SVG to RCA  . CORONARY ARTERY BYPASS GRAFT  10/16/2004  . ELECTROPHYSIOLOGIC STUDY N/A 11/21/2016   Procedure: A-Flutter Ablation;  Surgeon: Evans Lance, MD;  Location: [facility];  Service: Cardiovascular;  Laterality: N/A;  . TEE WITHOUT CARDIOVERSION N/A 11/21/2016   Procedure: TRANSESOPHAGEAL ECHOCARDIOGRAM (TEE);  Surgeon: Sanda Klein, MD;  Location: [facility] ENDOSCOPY;  Service: Cardiovascular;  Laterality: N/A;    Family History  Problem Relation Age of Onset  . Hypertension Mother   . Alcohol abuse Mother   . Alcohol abuse Father   . Colon cancer Neg Hx     Social History   Socioeconomic History  . Marital status: Married    Spouse name: Not on file  . Number of children: Not on file  . Years of education: Not on file  . Highest education level: Not on file  Occupational History  . Not on file  Tobacco Use  . Smoking status: Former Smoker    Packs/day: 2.00    Years: 27.00    Pack years: 54.00    Types: Cigarettes  Start date: 02/01/1971    Quit date: 01/31/1998    Years since quitting: 23.0  . Smokeless tobacco: Current User    Types: Chew  Vaping Use  . Vaping Use: Never used  Substance and Sexual Activity  . Alcohol use: Yes    Alcohol/week: 0.0 standard drinks    Comment: Drinks beer daily, typically 2-4 a day. Also 4 oz red wine an evening.  . Drug use: No  . Sexual activity: Not on file  Other Topics Concern  . Not on file  Social History Narrative  . Not on file   Social Determinants of Health   Financial Resource Strain: Not on file  Food Insecurity: Not on file  Transportation Needs: Not on file  Physical  Activity: Not on file  Stress: Not on file  Social Connections: Not on file  Intimate Partner Violence: Not on file    Outpatient Medications Prior to Visit  Medication Sig Dispense Refill  . albuterol (PROVENTIL) (2.5 MG/3ML) 0.083% nebulizer solution Take 3 mLs (2.5 mg total) by nebulization every 6 (six) hours as needed for wheezing or shortness of breath. 150 mL 3  . albuterol (VENTOLIN HFA) 108 (90 Base) MCG/ACT inhaler Inhale 2 puffs into the lungs every 4 (four) hours as needed for wheezing. 18 g 5  . aspirin EC 81 MG tablet Take 1 tablet (81 mg total) by mouth daily. (Patient taking differently: Take 81 mg by mouth 2 (two) times a week.) 30 tablet 11  . budesonide-formoterol (SYMBICORT) 160-4.5 MCG/ACT inhaler TAKE 2 PUFFS FIRST THING IN MORNING AND THEN ANOTHER 2 PUFFS ABOUT 12 HOURS LATER. (Patient taking differently: Inhale 2 puffs into the lungs in the morning and at bedtime. TAKE 2 PUFFS FIRST THING IN MORNING AND THEN ANOTHER 2 PUFFS ABOUT 12 HOURS LATER.) 30.6 Inhaler 1  . ezetimibe-simvastatin (VYTORIN) 10-40 MG tablet Take 1 tablet by mouth at bedtime. TAKE 1 TABLET BY MOUTH EVERYDAY AT BEDTIME 90 tablet 1  . fenofibrate 160 MG tablet Take 1 tablet (160 mg total) by mouth daily. 90 tablet 1  . fish oil-omega-3 fatty acids 1000 MG capsule Take 1 g by mouth in the morning and at bedtime.    Marland Kitchen losartan-hydrochlorothiazide (HYZAAR) 100-25 MG tablet Take 1 tablet by mouth daily. 90 tablet 0  . metoprolol tartrate (LOPRESSOR) 50 MG tablet Take 1 tablet (50 mg total) by mouth 2 (two) times daily. 180 tablet 1  . mupirocin ointment (BACTROBAN) 2 % Apply 1 application topically 2 (two) times daily. (Patient taking differently: Apply 1 application topically 2 (two) times daily as needed (wound care).) 22 g 0  . nitroGLYCERIN (NITROSTAT) 0.4 MG SL tablet Place 1 tablet (0.4 mg total) under the tongue every 5 (five) minutes as needed. 25 tablet 3  . tamsulosin (FLOMAX) 0.4 MG CAPS capsule  TAKE 1 CAPSULE BY MOUTH EVERY DAY (Patient taking differently: Take 0.4 mg by mouth daily.) 90 capsule 0  . tiZANidine (ZANAFLEX) 4 MG tablet Take 1 tablet (4 mg total) by mouth every 6 (six) hours as needed for muscle spasms. 30 tablet 0  . zolpidem (AMBIEN) 10 MG tablet Take 10 mg by mouth at bedtime as needed for sleep.     No facility-administered medications prior to visit.    Allergies  Allergen Reactions  . Ace Inhibitors Shortness Of Breath and Cough  . Enalapril Cough    Pt has been switched to Valsartan and is tolerating the different class.  . Clotrimazole-Betamethasone Rash  .  Prednisone Rash    Review of Systems  Constitutional: Negative for chills and fever.  HENT: Negative for congestion and sore throat.   Eyes: Negative for pain and discharge.  Respiratory: Negative for cough and shortness of breath.   Cardiovascular: Negative for chest pain and palpitations.  Gastrointestinal: Negative for constipation, diarrhea, nausea and vomiting.  Endocrine: Negative for polydipsia and polyuria.  Genitourinary: Negative for dysuria, flank pain and hematuria.       Nocturia  Musculoskeletal: Negative for neck pain and neck stiffness.  Skin: Positive for rash.  Neurological: Negative for dizziness, weakness, numbness and headaches.  Psychiatric/Behavioral: Positive for sleep disturbance. Negative for agitation, behavioral problems, dysphoric mood and suicidal ideas. The patient is nervous/anxious.        Objective:    Physical Exam Vitals reviewed.  Constitutional:      General: He is not in acute distress.    Appearance: He is not diaphoretic.  HENT:     Head: Normocephalic and atraumatic.     Nose: Nose normal.     Mouth/Throat:     Mouth: Mucous membranes are moist.  Eyes:     General: No scleral icterus.    Extraocular Movements: Extraocular movements intact.     Pupils: Pupils are equal, round, and reactive to light.  Cardiovascular:     Rate and Rhythm:  Normal rate and regular rhythm.     Pulses: Normal pulses.     Heart sounds: Normal heart sounds. No murmur heard.   Pulmonary:     Breath sounds: Normal breath sounds. No wheezing or rales.  Abdominal:     Palpations: Abdomen is soft.     Tenderness: There is no abdominal tenderness.  Musculoskeletal:     Cervical back: Neck supple. No tenderness.     Right lower leg: No edema.     Left lower leg: No edema.  Skin:    General: Skin is warm.     Findings: Rash present.     Comments: Crusted vesicles over b/l lower rib cage area Eczematous rash over groin and scrotal area  Neurological:     General: No focal deficit present.     Mental Status: He is alert and oriented to person, place, and time.     Sensory: No sensory deficit.     Motor: No weakness.  Psychiatric:        Mood and Affect: Mood normal.        Behavior: Behavior normal.     BP (!) 156/73   Pulse 62   Resp 15   Ht 6\' 1"  (1.854 m)   Wt 216 lb (98 kg)   SpO2 94%   BMI 28.50 kg/m  Wt Readings from Last 3 Encounters:  02/15/21 216 lb (98 kg)  01/24/21 209 lb (94.8 kg)  09/25/20 218 lb (98.9 kg)    Health Maintenance Due  Topic Date Due  . Hepatitis C Screening  Never done  . TETANUS/TDAP  Never done  . PNA vac Low Risk Adult (2 of 2 - PPSV23) 01/05/2018  . COVID-19 Vaccine (3 - Booster for Moderna series) 08/19/2020    There are no preventive care reminders to display for this patient.   Lab Results  Component Value Date   TSH 2.830 12/26/2020   Lab Results  Component Value Date   WBC 6.5 12/26/2020   HGB 14.6 12/26/2020   HCT 44.0 12/26/2020   MCV 94 12/26/2020   PLT 150 12/26/2020   Lab Results  Component Value Date   NA 140 12/26/2020   K 4.2 12/26/2020   CO2 26 12/26/2020   GLUCOSE 108 (H) 12/26/2020   BUN 26 12/26/2020   CREATININE 1.40 (H) 12/26/2020   BILITOT 0.7 12/26/2020   BILITOT 0.7 12/26/2020   ALKPHOS 28 (L) 12/26/2020   ALKPHOS 30 (L) 12/26/2020   AST 38 12/26/2020    AST 37 12/26/2020   ALT 30 12/26/2020   ALT 28 12/26/2020   PROT 6.9 12/26/2020   PROT 6.7 12/26/2020   ALBUMIN 4.6 12/26/2020   ALBUMIN 4.5 12/26/2020   CALCIUM 9.9 12/26/2020   ANIONGAP 6 03/25/2020   GFR 60.41 08/13/2015   Lab Results  Component Value Date   CHOL 280 (H) 12/26/2020   Lab Results  Component Value Date   HDL 42 12/26/2020   Lab Results  Component Value Date   LDLCALC 193 (H) 12/26/2020   Lab Results  Component Value Date   TRIG 234 (H) 12/26/2020   Lab Results  Component Value Date   CHOLHDL 6.7 (H) 12/26/2020   Lab Results  Component Value Date   HGBA1C 5.7 (H) 12/26/2020       Assessment & Plan:   Problem List Items Addressed This Visit      Cardiovascular and Mediastinum   Essential hypertension, benign    Elevated today, but remains stable at home Continue Losartan-HCTZ and Metoprolol        Other   Insomnia   Relevant Medications   zolpidem (AMBIEN) 10 MG tablet (Start on 03/18/2021)   Herpes zoster without complication - Primary    In the rib cage area b/l Crusted lesions, no role of antiviral Denies burning pain Neosporin cream PRN       Other Visit Diagnoses    Eczema, unspecified type    Continue Clobetasol for now If whitish plaques or dark skin, contact us.        Meds ordered this encounter  Medications  . zolpidem (AMBIEN) 10 MG tablet    Sig: Take 1 tablet (10 mg total) by mouth at bedtime as needed for sleep.    Dispense:  30 tablet    Refill:  3     Shawntee Mainwaring Keith Rake, MD

## 2021-02-15 NOTE — Assessment & Plan Note (Signed)
In the rib cage area b/l Crusted lesions, no role of antiviral Denies burning pain Neosporin cream PRN

## 2021-02-15 NOTE — Patient Instructions (Signed)
Continue to apply Clobatesol cream over groin area and scrotal rash area.  If persistent rash, please contact us.

## 2021-02-25 ENCOUNTER — Telehealth: Payer: Self-pay | Admitting: Cardiology

## 2021-02-25 NOTE — Telephone Encounter (Signed)
I spoke with patient. He had intermittent CP 2 days ago which lasted 8 hours. His wife tried to get him to use NTG but he declined. He reports a lot of stress in his life now and he currently has shingles but states he is not uncomfortable. He requests an appointment to discuss issues with Dr.McDowell.   Apt made in [place] tomorrow, 02/26/21 at 11 am

## 2021-02-25 NOTE — Progress Notes (Signed)
Cardiology Office Note  Date: 02/26/2021   ID: Dennis [name], DOB March 01, 1951, MRN [id number]  PCP:  Lindell Spar, MD  Cardiologist:  Rozann Lesches, MD Electrophysiologist:  None   Chief Complaint  Patient presents with  . Cardiac follow-up    History of Present Illness: Dennis [name] is a 70 y.o. male last seen in March 2021.  He presents for a follow-up visit.  States that on Saturday he developed a prolonged episode of chest tightness.  Did not take nitroglycerin, but did take an extra aspirin.  The next day he had a few recurrent episodes that were less prominent, and ultimately the symptoms resolved completely.  He states that he has been under a lot of emotional stress and feels like this could be contributing.  Also does have indigestion symptoms.  I personally reviewed his ECG today which is chronically abnormal.  He has ST segment changes in the inferolateral leads that are similar to prior tracing in March of last year, actually anterolateral T wave inversions are less prominent.  He did undergo a follow-up Myoview last year which was low risk as noted below.  We reviewed his medications.  Also discussed addition of low-dose Imdur to his regimen.  More recently in February of this year, lab work showed LDL 193.  He reports compliance with Vytorin, does have history of statin intolerance, is also on fenofibrate.  I have talked with him again about referral to the lipid clinic.   Past Medical History:  Diagnosis Date  . Atrial flutter ([place])    Diagnosed by ECG August 2015  . Colitis   . COPD (chronic obstructive pulmonary disease) ([place])   . Coronary atherosclerosis of native coronary artery    Multivessel status post CABG  . Essential hypertension   . Hyperlipidemia   . Insomnia     Past Surgical History:  Procedure Laterality Date  . COLONOSCOPY  01/29/2005   Dr. Gala Romney; rectal polyp s/p polypectomy, otherwise normal.  Pathology with tubulovillous adenoma.   . COLONOSCOPY  02/06/2008   Dr. Gala Romney; minimal internal hemorrhoids, diminutive polyp in the splenic flexure s/p cold biopsy removal, otherwise normal.  Pathology with benign polypoid colonic mucosa.  . COLONOSCOPY WITH PROPOFOL N/A 08/30/2015   Procedure: COLONOSCOPY WITH PROPOFOL at cecum at 0814; withdrawal time=8 minutes;  Surgeon: Daneil Dolin, MD; internal hemorrhoids-likely source of hematochezia, otherwise normal exam.  Repeat in 5 years.  . COLONOSCOPY WITH PROPOFOL N/A 01/28/2021   Procedure: COLONOSCOPY WITH PROPOFOL;  Surgeon: Daneil Dolin, MD;  [facility];  Service: Endoscopy;  Laterality: N/A;  am appt  . CORONARY ARTERY BYPASS GRAFT  2005   Dr. Lucianne Lei Trigt: LIMA to LAD, right radial to circumflex, SVG to RCA  . CORONARY ARTERY BYPASS GRAFT  10/16/2004  . ELECTROPHYSIOLOGIC STUDY N/A 11/21/2016   Procedure: A-Flutter Ablation;  Surgeon: Evans Lance, MD;  Location: [facility] LAB;  Service: Cardiovascular;  Laterality: N/A;  . TEE WITHOUT CARDIOVERSION N/A 11/21/2016   Procedure: TRANSESOPHAGEAL ECHOCARDIOGRAM (TEE);  Surgeon: Sanda Klein, MD;  Location: [facility] ENDOSCOPY;  Service: Cardiovascular;  Laterality: N/A;    Current Outpatient Medications  Medication Sig Dispense Refill  . albuterol (PROVENTIL) (2.5 MG/3ML) 0.083% nebulizer solution Take 3 mLs (2.5 mg total) by nebulization every 6 (six) hours as needed for wheezing or shortness of breath. 150 mL 3  . albuterol (VENTOLIN HFA) 108 (90 Base) MCG/ACT inhaler Inhale 2 puffs into the lungs every  4 (four) hours as needed for wheezing. 18 g 5  . aspirin EC 81 MG tablet Take 1 tablet (81 mg total) by mouth daily. (Patient taking differently: Take 81 mg by mouth 2 (two) times a week.) 30 tablet 11  . budesonide-formoterol (SYMBICORT) 160-4.5 MCG/ACT inhaler TAKE 2 PUFFS FIRST THING IN MORNING AND THEN ANOTHER 2 PUFFS ABOUT 12 HOURS LATER. (Patient taking differently: Inhale 2 puffs into the lungs in the morning  and at bedtime. TAKE 2 PUFFS FIRST THING IN MORNING AND THEN ANOTHER 2 PUFFS ABOUT 12 HOURS LATER.) 30.6 Inhaler 1  . ezetimibe-simvastatin (VYTORIN) 10-40 MG tablet Take 1 tablet by mouth at bedtime. TAKE 1 TABLET BY MOUTH EVERYDAY AT BEDTIME 90 tablet 1  . fenofibrate 160 MG tablet Take 1 tablet (160 mg total) by mouth daily. 90 tablet 1  . fish oil-omega-3 fatty acids 1000 MG capsule Take 1 g by mouth in the morning and at bedtime.    . isosorbide mononitrate (IMDUR) 30 MG 24 hr tablet Take 0.5 tablets (15 mg total) by mouth every evening. 45 tablet 1  . losartan-hydrochlorothiazide (HYZAAR) 100-25 MG tablet Take 1 tablet by mouth daily. 90 tablet 0  . metoprolol tartrate (LOPRESSOR) 50 MG tablet Take 1 tablet (50 mg total) by mouth 2 (two) times daily. 180 tablet 1  . mupirocin ointment (BACTROBAN) 2 % Apply 1 application topically 2 (two) times daily. (Patient taking differently: Apply 1 application topically 2 (two) times daily as needed (wound care).) 22 g 0  . nitroGLYCERIN (NITROSTAT) 0.4 MG SL tablet Place 1 tablet (0.4 mg total) under the tongue every 5 (five) minutes as needed. 25 tablet 3  . tamsulosin (FLOMAX) 0.4 MG CAPS capsule TAKE 1 CAPSULE BY MOUTH EVERY DAY (Patient taking differently: Take 0.4 mg by mouth daily.) 90 capsule 0  . tiZANidine (ZANAFLEX) 4 MG tablet Take 1 tablet (4 mg total) by mouth every 6 (six) hours as needed for muscle spasms. 30 tablet 0  . [START ON 03/18/2021] zolpidem (AMBIEN) 10 MG tablet Take 1 tablet (10 mg total) by mouth at bedtime as needed for sleep. 30 tablet 3   No current facility-administered medications for this visit.   Allergies:  Ace inhibitors, Enalapril, Clotrimazole-betamethasone, and Prednisone   ROS: No palpitations or syncope.  Physical Exam: VS:  BP 118/72   Pulse 62   Ht 6\' 1"  (1.854 m)   Wt 212 lb (96.2 kg)   SpO2 99%   BMI 27.97 kg/m , BMI Body mass index is 27.97 kg/m.  Wt Readings from Last 3 Encounters:  02/26/21 212  lb (96.2 kg)  02/15/21 216 lb (98 kg)  01/24/21 209 lb (94.8 kg)    General: Patient appears comfortable at rest. HEENT: Conjunctiva and lids normal, wearing a mask. Neck: Supple, no elevated JVP or carotid bruits, no thyromegaly. Lungs: Clear to auscultation, nonlabored breathing at rest. Cardiac: Regular rate and rhythm, no S3 or significant systolic murmur, no pericardial rub. Extremities: No pitting edema.  ECG:  An ECG dated 03/25/2020 was personally reviewed today and demonstrated:  Sinus rhythm with IVCD, nonspecific ST changes and lead motion artifact.  Recent Labwork: 12/26/2020: ALT 30; ALT 28; AST 38; AST 37; BUN 26; Creatinine, Ser 1.40; Hemoglobin 14.6; Platelets 150; Potassium 4.2; Sodium 140; TSH 2.830     Component Value Date/Time   CHOL 280 (H) 12/26/2020 0916   TRIG 234 (H) 12/26/2020 0916   HDL 42 12/26/2020 0916   CHOLHDL 6.7 (H) 12/26/2020 5784  CHOLHDL 9.4 (H) 01/11/2018 0833   VLDL 35 08/25/2014 0922   LDLCALC 193 (H) 12/26/2020 0916   LDLCALC  01/11/2018 0932     Comment:     . LDL cholesterol not calculated. Triglyceride levels greater than 400 mg/dL invalidate calculated LDL results. . Reference range: <100 . Desirable range <100 mg/dL for primary prevention;   <70 mg/dL for patients with CHD or diabetic patients  with > or = 2 CHD risk factors. Marland Kitchen LDL-C is now calculated using the Martin-Hopkins  calculation, which is a validated novel method providing  better accuracy than the Friedewald equation in the  estimation of LDL-C.  Cresenciano Genre et al. Annamaria Helling. 3557;322(02): 2061-2068  (http://education.[employer].com/faq/FAQ164)     Other Studies Reviewed Today:  Lexiscan Myoview 03/08/2020:  No diagnostic ST segment changes over baseline to indicate ischemia.  Small size, mild intensity, and predominantly apical inferior defect that is most consistent with soft tissue attenuation rather than scar in light of normal wall motion. No large ischemic  territories noted.  This is a low risk study.  Nuclear stress EF: 55%.  Carotid Dopplers 03/08/2020: IMPRESSION: Color duplex indicates moderate heterogeneous and calcified plaque, with no hemodynamically significant stenosis by duplex criteria in the extracranial cerebrovascular circulation.  Assessment and Plan:  1.  Recent episode of prolonged chest pain as outlined above.  ECG abnormalities are chronic.  We went over his medications and have discussed addition of Imdur beginning at 15 mg once in the evening for additional antianginal effect.  2.  Multivessel CAD status post CABG in 2005.  Follow-up Myoview from April of last year was low risk.  Imdur being added as noted above.  Otherwise continue aspirin, Vytorin, Hyzaar, Lopressor, and as needed nitroglycerin.  3.  Severe hyperlipidemia with statin intolerance.  He has been able to take Vytorin although with suboptimal LDL control, recently 193.  I have discussed options with him and recommended referral to the lipid clinic for further evaluation..  Medication Adjustments/Labs and Tests Ordered: Current medicines are reviewed at length with the patient today.  Concerns regarding medicines are outlined above.   Tests Ordered: Orders Placed This Encounter  Procedures  . AMB Referral to [facility]  . EKG 12-Lead    Medication Changes: Meds ordered this encounter  Medications  . isosorbide mononitrate (IMDUR) 30 MG 24 hr tablet    Sig: Take 0.5 tablets (15 mg total) by mouth every evening.    Dispense:  45 tablet    Refill:  1    02/26/2021 NEW    Disposition:  Follow up 3 months in the [place] office.  Signed, Satira Sark, MD, [facility] 02/26/2021 11:39 AM    [facility], [place], [place] [address] Phone: [phone]; Fax: [phone]

## 2021-02-25 NOTE — Telephone Encounter (Signed)
Pt c/o of Chest Pain:  1. Are you having CP right now? No   2. Are you experiencing any other symptoms (ex. SOB, nausea, vomiting, sweating)? NO  3. How long have you been experiencing CP?  Off and On since Saturday   4. Is your CP continuous or coming and going?   5. Have you taken Nitroglycerin? No  Patient states that he wants to make an appointment  States that he is having stressful issues also.

## 2021-02-26 ENCOUNTER — Other Ambulatory Visit: Payer: Self-pay

## 2021-02-26 ENCOUNTER — Encounter: Payer: Self-pay | Admitting: Cardiology

## 2021-02-26 ENCOUNTER — Ambulatory Visit (INDEPENDENT_AMBULATORY_CARE_PROVIDER_SITE_OTHER): Payer: Medicare Other | Admitting: Cardiology

## 2021-02-26 VITALS — BP 118/72 | HR 62 | Ht 73.0 in | Wt 212.0 lb

## 2021-02-26 DIAGNOSIS — E782 Mixed hyperlipidemia: Secondary | ICD-10-CM | POA: Diagnosis not present

## 2021-02-26 DIAGNOSIS — I2510 Atherosclerotic heart disease of native coronary artery without angina pectoris: Secondary | ICD-10-CM | POA: Diagnosis not present

## 2021-02-26 DIAGNOSIS — I25119 Atherosclerotic heart disease of native coronary artery with unspecified angina pectoris: Secondary | ICD-10-CM | POA: Diagnosis not present

## 2021-02-26 MED ORDER — ISOSORBIDE MONONITRATE ER 30 MG PO TB24: 15.0000 mg | ORAL_TABLET | Freq: Every evening | ORAL | 1 refills | Status: DC

## 2021-02-26 NOTE — Patient Instructions (Addendum)
Medication Instructions:   Your physician has recommended you make the following change in your medication:   Start isosorbide mononitrate (imdur) 15 mg daily in the evening  Continue other medications the same  Labwork:  none  Testing/Procedures:  none  Follow-Up:  Your physician recommends that you schedule a follow-up appointment in: 3 months in [place]  Any Other Special Instructions Will Be Listed Below (If Applicable).  You have been referred to the [facility]  If you need a refill on your cardiac medications before your next appointment, please call your pharmacy.

## 2021-02-27 ENCOUNTER — Telehealth: Payer: Self-pay | Admitting: Cardiology

## 2021-02-27 NOTE — Telephone Encounter (Signed)
Called patient to schedule lipid clinic appointment with Dr. Debara Pickett. He states he wants to have an idea of how much all of it will cost him, before scheduling.

## 2021-02-27 NOTE — Telephone Encounter (Signed)
Advised that his insurance would be billed for the visit and that the providers would determine which medication would be more cost effective with his insurance. Verbalized understanding and says he will call back to schedule.

## 2021-02-27 NOTE — Telephone Encounter (Signed)
Returned call to pt. No answer. Left msg to call back.  

## 2021-03-03 ENCOUNTER — Other Ambulatory Visit: Payer: Self-pay | Admitting: Internal Medicine

## 2021-03-03 DIAGNOSIS — I10 Essential (primary) hypertension: Secondary | ICD-10-CM

## 2021-03-20 ENCOUNTER — Ambulatory Visit (INDEPENDENT_AMBULATORY_CARE_PROVIDER_SITE_OTHER): Payer: Medicare Other

## 2021-03-20 ENCOUNTER — Other Ambulatory Visit: Payer: Self-pay

## 2021-03-20 ENCOUNTER — Encounter: Payer: Self-pay | Admitting: Orthopedic Surgery

## 2021-03-20 ENCOUNTER — Ambulatory Visit (INDEPENDENT_AMBULATORY_CARE_PROVIDER_SITE_OTHER): Payer: Medicare Other | Admitting: Orthopedic Surgery

## 2021-03-20 DIAGNOSIS — M545 Low back pain, unspecified: Secondary | ICD-10-CM | POA: Diagnosis not present

## 2021-03-20 DIAGNOSIS — I2510 Atherosclerotic heart disease of native coronary artery without angina pectoris: Secondary | ICD-10-CM

## 2021-03-20 MED ORDER — MELOXICAM 15 MG PO TABS: 15.0000 mg | ORAL_TABLET | Freq: Every day | ORAL | 0 refills | Status: DC

## 2021-03-20 MED ORDER — METHOCARBAMOL 500 MG PO TABS
500.0000 mg | ORAL_TABLET | Freq: Three times a day (TID) | ORAL | 0 refills | Status: DC | PRN
Start: 2021-03-20 — End: 2021-04-04

## 2021-03-20 NOTE — Progress Notes (Signed)
Office Visit Note   Patient: Dennis [name]           Date of Birth: 1951/01/08           MRN: [id number] Visit Date: 03/20/2021 Requested by: Lindell Spar, MD [address] [place],  [place] [address] PCP: Lindell Spar, MD  Subjective: Chief Complaint  Patient presents with  . Lower Back - Pain    HPI: Dennis [name] is a 70 year old patient with low back pain.  Denies any history of injury.  Cute onset 2 months ago and it was quite severe at that time but it has been improving.  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  This did improve without injections.  Patient states that it is stiff for him to move but he is able to work out the stiffness early in the morning.  Takes Tylenol muscle relaxer to help him move.  Has improved significantly compared to 2 months ago.  Currently he is about 80% normal.  He also reports having some numbness in both great toes when he lies supine.  This improves when he gets up.  No new medications.  All of his pain is in the back.  Does not have any radicular symptoms except for bilateral great toe numbness when supine.              ROS: All systems reviewed are negative as they relate to the chief complaint within the history of present illness.  Patient denies  fevers or chills.   Assessment & Plan: Visit Diagnoses:  1. Low back pain, unspecified back pain laterality, unspecified chronicity, unspecified whether sciatica present     Plan: Impression is low back pain with trace palpable pedal pulses warm perfused.  Good ankle dorsiflexion plantarflexion strength with no muscle atrophy and no nerve root tension signs.  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 Patient  .  Has improved since then.  Wrote him a prescription for Mobic and Robaxin to take should his symptoms recur.  Could consider repeat MRI scanning and injections if his symptoms do not  improve.  Also discussed physical therapy for hamstring stretching but he wants to hold off on that for now.  Follow-up as needed.  Follow-Up Instructions: Return if symptoms worsen or fail to improve.   Orders:  Orders Placed This Encounter  Procedures  . XR Lumbar Spine 2-3 Views   Meds ordered this encounter  Medications  . meloxicam (MOBIC) 15 MG tablet    Sig: Take 1 tablet (15 mg total) by mouth daily.    Dispense:  30 tablet    Refill:  0  . methocarbamol (ROBAXIN) 500 MG tablet    Sig: Take 1 tablet (500 mg total) by mouth every 8 (eight) hours as needed for muscle spasms.    Dispense:  30 tablet    Refill:  0      Procedures: No procedures performed   Clinical Data: No additional findings.  Objective: Vital Signs: There were no vitals taken for this visit.  Physical Exam:   Constitutional: Patient appears well-developed HEENT:  Head: Normocephalic Eyes:EOM are normal Neck: Normal range of motion Cardiovascular: Normal rate Pulmonary/chest: Effort normal Neurologic: Patient is alert Skin: Skin is warm Psychiatric: Patient has normal mood and affect  Is Ortho Exam: Ortho exam demonstrates no nerve root tension signs.  +5 ankle dorsiflexion plantarflexion quad  hamstring strength.  Has perfused feet with trace palpable pedal pulses.  Both feet are warm.  Mild pain with forward lateral bending but fairly minimal.  No groin pain with internal or external Tatian of the leg.  Reflexes symmetric 1+ out of 4 bilateral patella and Achilles.  Gait is normal.  Specialty Comments:  No specialty comments available.  Imaging: XR Lumbar Spine 2-3 Views  Result Date: 03/20/2021 [facility] lateral radiographs lumbar spine reviewed.  Calcification of the aorta is present.  Degenerative disc base disease is present diffusely throughout the lumbar spine with no spondylolisthesis or compression fractures.  Moderate facet arthritis is present in the lower levels.      PMFS  History: Patient Active Problem List   Diagnosis Date Noted  . Herpes zoster without complication 28/36/6294  . Stage 3a chronic kidney disease ([place]) 01/02/2021  . Abdominal pain, epigastric 11/14/2020  . Encounter to establish care 09/25/2020  . Benign prostatic hyperplasia with urinary frequency 09/25/2020  . 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 Anxiety 07/23/2020  . Insomnia 07/23/2020  . COPD exacerbation ([place]) 03/25/2020  . Sensorineural hearing loss (SNHL) of both ears 12/02/2018  . PTSD (post-traumatic stress disorder) 11/10/2017  . Noise effect on both inner ears 01/20/2017  . Tinnitus aurium, 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  . Atrial flutter ([place]) 11/21/2016  . COPD GOLD III with reversibility  10/01/2015  . Hematochezia   . Diverticulosis of colon without hemorrhage   . Hemorrhoid   . History of adenomatous polyp of colon 08/14/2015  . Rectal bleeding 08/14/2015  . Upper airway cough syndrome 08/13/2015  . Dyspnea 08/13/2015  . Impaired fasting glucose 02/26/2015  . Chronic left hip pain 09/17/2014  . Typical atrial flutter ([place]) 07/17/2014  . Hyperlipidemia LDL goal <100 04/08/2010  . Essential hypertension, benign 04/08/2010  . Coronary atherosclerosis of native coronary artery 04/08/2010  . COPD (chronic obstructive pulmonary disease) ([place]) 04/08/2010   Past Medical History:  Diagnosis Date  . Atrial flutter ([place])    Diagnosed by ECG August 2015  . Colitis   . COPD (chronic obstructive pulmonary disease) ([place])   . Coronary atherosclerosis of native coronary artery    Multivessel status post CABG  . Essential hypertension   . Hyperlipidemia   . Insomnia     Family History  Problem Relation Age of Onset  . Hypertension Mother   . Alcohol abuse Mother   . Alcohol abuse Father   . Colon cancer Neg Hx     Past Surgical History:  Procedure Laterality Date  . COLONOSCOPY  01/29/2005   Dr. Gala Romney; rectal polyp s/p polypectomy, otherwise normal.  Pathology with tubulovillous  adenoma.  . COLONOSCOPY  02/06/2008   Dr. Gala Romney; minimal internal hemorrhoids, diminutive polyp in the splenic flexure s/p cold biopsy removal, otherwise normal.  Pathology with benign polypoid colonic mucosa.  . COLONOSCOPY WITH PROPOFOL N/A 08/30/2015   Procedure: COLONOSCOPY WITH PROPOFOL at cecum at 0814; withdrawal time=8 minutes;  Surgeon: Daneil Dolin, MD; internal hemorrhoids-likely source of hematochezia, otherwise normal exam.  Repeat in 5 years.  . COLONOSCOPY WITH PROPOFOL N/A 01/28/2021   Procedure: COLONOSCOPY WITH PROPOFOL;  Surgeon: Daneil Dolin, MD;  [facility];  Service: Endoscopy;  Laterality: N/A;  am appt  . CORONARY ARTERY BYPASS GRAFT  2005   Dr. Lucianne Lei Trigt: LIMA to LAD, right radial to circumflex, SVG to RCA  . CORONARY ARTERY BYPASS GRAFT  10/16/2004  . ELECTROPHYSIOLOGIC STUDY N/A 11/21/2016   Procedure: A-Flutter Ablation;  Surgeon: Evans Lance, MD;  Location: [facility] CV LAB;  Service: Cardiovascular;  Laterality: N/A;  . TEE WITHOUT CARDIOVERSION N/A 11/21/2016   Procedure: TRANSESOPHAGEAL ECHOCARDIOGRAM (TEE);  Surgeon: Sanda Klein, MD;  Location: [facility] ENDOSCOPY;  Service: Cardiovascular;  Laterality: N/A;   Social History   Occupational History  . Not on file  Tobacco Use  . Smoking status: Former Smoker    Packs/day: 2.00    Years: 27.00    Pack years: 54.00    Types: Cigarettes    Start date: 02/01/1971    Quit date: 01/31/1998    Years since quitting: 23.1  . Smokeless tobacco: Current User    Types: Chew  Vaping Use  . Vaping Use: Never used  Substance and Sexual Activity  . Alcohol use: Yes    Alcohol/week: 0.0 standard drinks    Comment: Drinks beer daily, typically 2-4 a day. Also 4 oz red wine an evening.  . Drug use: No  . Sexual activity: Not on file

## 2021-03-28 ENCOUNTER — Telehealth: Payer: Self-pay | Admitting: Orthopedic Surgery

## 2021-03-28 ENCOUNTER — Ambulatory Visit: Payer: Medicare Other | Admitting: Internal Medicine

## 2021-03-28 NOTE — Telephone Encounter (Signed)
Pt states pain has gotten worse and he can barley walk at this point. He wants to know what Dr.Dean recommends for him to do? [phone]

## 2021-03-29 ENCOUNTER — Telehealth: Payer: Medicare Other | Admitting: Internal Medicine

## 2021-03-29 ENCOUNTER — Other Ambulatory Visit: Payer: Self-pay

## 2021-03-29 DIAGNOSIS — M545 Low back pain, unspecified: Secondary | ICD-10-CM

## 2021-03-29 NOTE — Telephone Encounter (Signed)
Order made

## 2021-03-29 NOTE — Telephone Encounter (Signed)
Pls advise.  

## 2021-03-29 NOTE — Telephone Encounter (Signed)
Patient aware.

## 2021-03-29 NOTE — Telephone Encounter (Signed)
Needs mri scan lspine thx

## 2021-03-30 ENCOUNTER — Other Ambulatory Visit: Payer: Self-pay | Admitting: Family Medicine

## 2021-04-01 ENCOUNTER — Other Ambulatory Visit: Payer: Self-pay

## 2021-04-01 ENCOUNTER — Ambulatory Visit
Admission: RE | Admit: 2021-04-01 | Discharge: 2021-04-01 | Disposition: A | Discharge status: Home / Self Care | Payer: Medicare Other | Source: Ambulatory Visit | Attending: Orthopedic Surgery | Admitting: Orthopedic Surgery

## 2021-04-01 DIAGNOSIS — M545 Low back pain, unspecified: Secondary | ICD-10-CM

## 2021-04-01 NOTE — Progress Notes (Signed)
I called and discussed his MRI scan with him.  He has L4-5 bulging disks which are abutting but not necessarily compressing the descending nerve roots.  I think he would do well with 1 or 2 injections.  Can you please arrange them for [facility] imaging.  He wants to try to avoid coming up having a conference and then getting the shot on another day.Marland Kitchen  He would rather just go into [facility] imaging and get the shot.  Thanks

## 2021-04-02 ENCOUNTER — Other Ambulatory Visit: Payer: Self-pay

## 2021-04-02 DIAGNOSIS — M545 Low back pain, unspecified: Secondary | ICD-10-CM

## 2021-04-03 ENCOUNTER — Other Ambulatory Visit: Payer: Self-pay

## 2021-04-03 ENCOUNTER — Ambulatory Visit
Admission: RE | Admit: 2021-04-03 | Discharge: 2021-04-03 | Disposition: A | Discharge status: Home / Self Care | Payer: Medicare Other | Source: Ambulatory Visit | Attending: Orthopedic Surgery | Admitting: Orthopedic Surgery

## 2021-04-03 DIAGNOSIS — M545 Low back pain, unspecified: Secondary | ICD-10-CM

## 2021-04-03 DIAGNOSIS — M47816 Spondylosis without myelopathy or radiculopathy, lumbar region: Secondary | ICD-10-CM | POA: Diagnosis not present

## 2021-04-03 MED ORDER — METHYLPREDNISOLONE ACETATE 40 MG/ML INJ SUSP (RADIOLOG
80.0000 mg | Freq: Once | INTRAMUSCULAR | Status: AC
  Administered 2021-04-03: 80 mg via EPIDURAL

## 2021-04-03 MED ORDER — IOPAMIDOL (ISOVUE-M 200) INJECTION 41%
1.0000 mL | Freq: Once | INTRAMUSCULAR | Status: AC
  Administered 2021-04-03: 1 mL via EPIDURAL

## 2021-04-03 NOTE — Discharge Instructions (Signed)

## 2021-04-04 ENCOUNTER — Other Ambulatory Visit: Payer: Self-pay | Admitting: Orthopedic Surgery

## 2021-04-04 NOTE — Telephone Encounter (Signed)
Pls advise.  

## 2021-04-17 ENCOUNTER — Other Ambulatory Visit: Payer: Self-pay | Admitting: Orthopedic Surgery

## 2021-04-18 NOTE — Telephone Encounter (Signed)
Please advise 

## 2021-04-24 ENCOUNTER — Ambulatory Visit: Payer: Medicare Other | Admitting: Gastroenterology

## 2021-05-17 ENCOUNTER — Other Ambulatory Visit: Payer: Self-pay | Admitting: Internal Medicine

## 2021-05-24 DIAGNOSIS — Z20822 Contact with and (suspected) exposure to covid-19: Secondary | ICD-10-CM | POA: Diagnosis not present

## 2021-05-29 ENCOUNTER — Ambulatory Visit: Payer: Medicare Other | Admitting: Student

## 2021-06-10 ENCOUNTER — Telehealth: Payer: Self-pay | Admitting: *Deleted

## 2021-06-10 ENCOUNTER — Telehealth: Payer: Self-pay

## 2021-06-10 NOTE — Telephone Encounter (Signed)
Patient informed. 

## 2021-06-10 NOTE — Telephone Encounter (Signed)
Please call and let pt know dr patel will advise on this but it will be after 06-24-21

## 2021-06-10 NOTE — Telephone Encounter (Signed)
Patient called can bloodwork be order prior to 8/23 appointment  Return in about 4 months (around 06/17/2021) for HTN and HLD.

## 2021-06-10 NOTE — Telephone Encounter (Signed)
Please advise if you would like pt to have labs drawn before 07-22-21 appt. Advised him we would notify him after your return on 06-24-21

## 2021-06-17 ENCOUNTER — Ambulatory Visit: Payer: Medicare Other | Admitting: Internal Medicine

## 2021-06-17 ENCOUNTER — Other Ambulatory Visit: Payer: Self-pay

## 2021-06-17 DIAGNOSIS — E785 Hyperlipidemia, unspecified: Secondary | ICD-10-CM

## 2021-06-17 DIAGNOSIS — I10 Essential (primary) hypertension: Secondary | ICD-10-CM

## 2021-06-17 DIAGNOSIS — R7301 Impaired fasting glucose: Secondary | ICD-10-CM

## 2021-06-17 NOTE — Telephone Encounter (Signed)
Lab order placed.

## 2021-06-18 NOTE — Telephone Encounter (Signed)
Pt aware will have drawn before next visit

## 2021-06-19 ENCOUNTER — Encounter (HOSPITAL_BASED_OUTPATIENT_CLINIC_OR_DEPARTMENT_OTHER): Payer: Self-pay | Admitting: Internal Medicine

## 2021-06-19 ENCOUNTER — Other Ambulatory Visit (HOSPITAL_BASED_OUTPATIENT_CLINIC_OR_DEPARTMENT_OTHER): Payer: Self-pay | Admitting: Internal Medicine

## 2021-06-19 ENCOUNTER — Other Ambulatory Visit: Payer: Self-pay

## 2021-06-19 ENCOUNTER — Ambulatory Visit (INDEPENDENT_AMBULATORY_CARE_PROVIDER_SITE_OTHER): Payer: Medicare Other | Admitting: Internal Medicine

## 2021-06-19 VITALS — BP 142/80 | HR 56 | Ht 73.0 in | Wt 214.0 lb

## 2021-06-19 DIAGNOSIS — E7801 Familial hypercholesterolemia: Secondary | ICD-10-CM

## 2021-06-19 DIAGNOSIS — I25119 Atherosclerotic heart disease of native coronary artery with unspecified angina pectoris: Secondary | ICD-10-CM

## 2021-06-19 DIAGNOSIS — Z951 Presence of aortocoronary bypass graft: Secondary | ICD-10-CM

## 2021-06-19 DIAGNOSIS — I2510 Atherosclerotic heart disease of native coronary artery without angina pectoris: Secondary | ICD-10-CM | POA: Diagnosis not present

## 2021-06-19 MED ORDER — REPATHA SURECLICK 140 MG/ML ~~LOC~~ SOAJ: 1.0000 | SUBCUTANEOUS | 11 refills | Status: DC

## 2021-06-19 NOTE — Progress Notes (Signed)
LIPID CLINIC CONSULT NOTE  Chief Complaint:  Manage dyslipidemia  Primary Care Physician: Lindell Spar, MD  Primary Cardiologist:  Rozann Lesches, MD  HPI:  Dennis [name] is a 70 y.o. male who is being seen today for the evaluation of dyslipidemia at the request of Satira Sark, MD. this is a pleasant 70 year old male kindly referred by Dr. Domenic Polite for evaluation and management of dyslipidemia.  He has a history of coronary artery disease and prior CABG as well as hypertension, COPD and atrial flutter in the past.  He had recent lipids in February 2022 which showed total cholesterol 280, HDL 42, LDL 193 and triglycerides 234.  This is essentially his cholesterol level for many years saying that prior to this in the past he is noted to have LDL cholesterol in the 2-300 range but had done very well on Vytorin 10/40 mg daily which significantly reduced his lipids.  He is also on fenofibrate 160 mg daily.  He has high cholesterol is certainly suggestive of familial hyperlipidemia.  He notes that he has 3 sons all with high cholesterol.  He has not previously been tested for FH.  His bypass was 17 years ago when he was 40.  This is premature onset coronary disease in the male again suggestive of familial hyperlipidemia.  PMHx:  Past Medical History:  Diagnosis Date   Atrial flutter ([place])    Diagnosed by ECG August 2015   Colitis    COPD (chronic obstructive pulmonary disease) ([place])    Coronary atherosclerosis of native coronary artery    Multivessel status post CABG   Essential hypertension    Hyperlipidemia    Insomnia     Past Surgical History:  Procedure Laterality Date   COLONOSCOPY  01/29/2005   Dr. Gala Romney; rectal polyp s/p polypectomy, otherwise normal.  Pathology with tubulovillous adenoma.   COLONOSCOPY  02/06/2008   Dr. Gala Romney; minimal internal hemorrhoids, diminutive polyp in the splenic flexure s/p cold biopsy removal, otherwise normal.  Pathology with benign  polypoid colonic mucosa.   COLONOSCOPY WITH PROPOFOL N/A 08/30/2015   Procedure: COLONOSCOPY WITH PROPOFOL at cecum at 0814; withdrawal time=8 minutes;  Surgeon: Daneil Dolin, MD; internal hemorrhoids-likely source of hematochezia, otherwise normal exam.  Repeat in 5 years.   COLONOSCOPY WITH PROPOFOL N/A 01/28/2021   Procedure: COLONOSCOPY WITH PROPOFOL;  Surgeon: Daneil Dolin, MD;  Location: [facility];  Service: Endoscopy;  Laterality: N/A;  am appt   CORONARY ARTERY BYPASS GRAFT  2005   Dr. Lucianne Lei Trigt: LIMA to LAD, right radial to circumflex, SVG to RCA   CORONARY ARTERY BYPASS GRAFT  10/16/2004   ELECTROPHYSIOLOGIC STUDY N/A 11/21/2016   Procedure: A-Flutter Ablation;  Surgeon: Evans Lance, MD;  Location: [place] [facility];  Service: Cardiovascular;  Laterality: N/A;   TEE WITHOUT CARDIOVERSION N/A 11/21/2016   Procedure: TRANSESOPHAGEAL ECHOCARDIOGRAM (TEE);  Surgeon: Sanda Klein, MD;  Location: [facility] ENDOSCOPY;  Service: Cardiovascular;  Laterality: N/A;    FAMHx:  Family History  Problem Relation Age of Onset   Hypertension Mother    Alcohol abuse Mother    Alcohol abuse Father    Colon cancer Neg Hx     SOCHx:   reports that he quit smoking about 23 years ago. His smoking use included cigarettes. He started smoking about 50 years ago. He has a 54.00 pack-year smoking history. His smokeless tobacco use includes chew. He reports current alcohol use. He reports that he does not  use drugs.  ALLERGIES:  Allergies  Allergen Reactions   Ace Inhibitors Shortness Of Breath and Cough   Enalapril Cough    Pt has been switched to Valsartan and is tolerating the different class.   Clotrimazole-Betamethasone Rash   Prednisone Rash    ROS: Pertinent items noted in HPI and remainder of comprehensive ROS otherwise negative.  HOME MEDS: Current Outpatient Medications on File Prior to Visit  Medication Sig Dispense Refill   albuterol (PROVENTIL) (2.5 MG/3ML) 0.083% nebulizer  solution Take 3 mLs (2.5 mg total) by nebulization every 6 (six) hours as needed for wheezing or shortness of breath. 150 mL 3   albuterol (VENTOLIN HFA) 108 (90 Base) MCG/ACT inhaler Inhale 2 puffs into the lungs every 4 (four) hours as needed for wheezing. 18 g 5   aspirin EC 81 MG tablet Take 1 tablet (81 mg total) by mouth daily. (Patient taking differently: Take 81 mg by mouth 2 (two) times a week.) 30 tablet 11   budesonide-formoterol (SYMBICORT) 160-4.5 MCG/ACT inhaler TAKE 2 PUFFS FIRST THING IN MORNING AND THEN ANOTHER 2 PUFFS ABOUT 12 HOURS LATER. (Patient taking differently: Inhale 2 puffs into the lungs in the morning and at bedtime. TAKE 2 PUFFS FIRST THING IN MORNING AND THEN ANOTHER 2 PUFFS ABOUT 12 HOURS LATER.) 30.6 Inhaler 1   ezetimibe-simvastatin (VYTORIN) 10-40 MG tablet Take 1 tablet by mouth at bedtime. TAKE 1 TABLET BY MOUTH EVERYDAY AT BEDTIME 90 tablet 1   fenofibrate 160 MG tablet TAKE 1 TABLET BY MOUTH EVERY DAY 90 tablet 1   fish oil-omega-3 fatty acids 1000 MG capsule Take 1 g by mouth in the morning and at bedtime.     losartan-hydrochlorothiazide (HYZAAR) 100-25 MG tablet Take 1 tablet by mouth daily. 90 tablet 0   metoprolol tartrate (LOPRESSOR) 50 MG tablet TAKE 1 TABLET BY MOUTH TWICE A DAY 180 tablet 1   mupirocin ointment (BACTROBAN) 2 % Apply 1 application topically 2 (two) times daily. (Patient taking differently: Apply 1 application topically 2 (two) times daily as needed (wound care).) 22 g 0   nitroGLYCERIN (NITROSTAT) 0.4 MG SL tablet Place 1 tablet (0.4 mg total) under the tongue every 5 (five) minutes as needed. 25 tablet 3   zolpidem (AMBIEN) 10 MG tablet Take 1 tablet (10 mg total) by mouth at bedtime as needed for sleep. 30 tablet 3   isosorbide mononitrate (IMDUR) 30 MG 24 hr tablet Take 0.5 tablets (15 mg total) by mouth every evening. (Patient not taking: Reported on 06/19/2021) 45 tablet 1   meloxicam (MOBIC) 15 MG tablet Take 0.5 tablets (7.5 mg total)  by mouth daily as needed for pain. (Patient not taking: Reported on 06/19/2021) 30 tablet 0   methocarbamol (ROBAXIN) 500 MG tablet TAKE 1 TABLET BY MOUTH EVERY 8 HOURS AS NEEDED FOR MUSCLE SPASMS. (Patient not taking: Reported on 06/19/2021) 30 tablet 0   tamsulosin (FLOMAX) 0.4 MG CAPS capsule TAKE 1 CAPSULE BY MOUTH EVERY DAY (Patient not taking: Reported on 06/19/2021) 90 capsule 0   No current facility-administered medications on file prior to visit.    LABS/IMAGING: No results found for this or any previous visit (from the past 48 hour(s)). No results found.  LIPID PANEL:    Component Value Date/Time   CHOL 280 (H) 12/26/2020 0916   TRIG 234 (H) 12/26/2020 0916   HDL 42 12/26/2020 0916   CHOLHDL 6.7 (H) 12/26/2020 0916   CHOLHDL 9.4 (H) 01/11/2018 0833   VLDL 35 08/25/2014 [id number]  LDLCALC 193 (H) 12/26/2020 0916   [place]  01/11/2018 [id number]     Comment:     . LDL cholesterol not calculated. Triglyceride levels greater than 400 mg/dL invalidate calculated LDL results. . Reference range: <100 . Desirable range <100 mg/dL for primary prevention;   <70 mg/dL for patients with CHD or diabetic patients  with > or = 2 CHD risk factors. Marland Kitchen LDL-C is now calculated using the Martin-Hopkins  calculation, which is a validated novel method providing  better accuracy than the Friedewald equation in the  estimation of LDL-C.  Cresenciano Genre et al. Annamaria Helling. [id number]): 2061-2068  (http://education.[employer].com/faq/FAQ164)     WEIGHTS: Wt Readings from Last 3 Encounters:  06/19/21 214 lb (97.1 kg)  02/26/21 212 lb (96.2 kg)  02/15/21 216 lb (98 kg)    VITALS: BP (!) 142/80   Pulse (!) 56   Ht '6\' 1"'$  (1.854 m)   Wt 214 lb (97.1 kg)   SpO2 99%   BMI 28.23 kg/m   EXAM: General appearance: alert and no distress Neck: no carotid bruit, no JVD, and thyroid not enlarged, symmetric, no tenderness/mass/nodules Lungs: clear to auscultation bilaterally Heart: regular rate and  rhythm, S1, S2 normal, no murmur, click, rub or gallop Abdomen: soft, non-tender; bowel sounds normal; no masses,  no organomegaly Extremities: extremities normal, atraumatic, no cyanosis or edema Pulses: 2+ and symmetric Skin: Skin color, texture, turgor normal. No rashes or lesions Neurologic: Grossly normal Psych: Pleasant  EKG: Deferred  ASSESSMENT: Familial hyperlipidemia Premature onset coronary disease status post CABG at age 26 Family history of multiple children with high cholesterol  PLAN: 1.   Dennis [name] has a familial hyperlipidemia without question as he is currently on combination therapy and LDL cholesterol remains well above 190.  He reports having 3 sons with elevated cholesterol as well and I advised that they get further testing if not genetic testing.  I offered genetic testing today and he would consider that.  He has done very well on Vytorin however he has still a long way to go with regards to improvement in his lipids.  He is a great candidate for PCSK9 inhibitor and based on his insurance would recommend Praluent 150 mg every 2 weeks.  We will proceed with prior authorization plan repeat lipids in about 3 to 4 months.  Thanks again for the kind referral.  Dennis Casino, MD, FACC, [place] Director of the Advanced Lipid Disorders &  Cardiovascular Risk Reduction Clinic Diplomate of the American Board of Clinical Lipidology Attending Cardiologist  Direct Dial: [phone]  Fax: [phone]  Website:  [email or web address]  Dennis [name] Dennis [name] 06/19/2021, 2:13 PM

## 2021-06-19 NOTE — Patient Instructions (Signed)
Medication Instructions:  Dr. Debara Pickett recommends Repatha '140mg'$ /mL (PCSK9). This is an injectable cholesterol medication self-administered once every 14 days. This medication will likely need prior approval with your insurance company, which we will work on. If the medication is not approved initially, we may need to do an appeal with your insurance.   Administer medication in area of fatty tissue such as abdomen, outer thigh, back of upper arm - and rotate site with each injection Store medication in refrigerator until ready to administer - allow to sit at room temp for 30 mins - 1 hour prior to injection Dispose of medication in a SHARPS container - your pharmacy should be able to direct you on this and proper disposal   If you need a co-pay card for Repatha: [email or web address]-[email or web address] >> paying for Repatha or red box that says "[place]" in top right If you need a co-pay card for Praluent: WedMap.it >> starting & paying for Praluent  Patient Assistance:   The PAN Foundation: https://www.panfoundation.org/disease-funds/hypercholesterolemia/ -- can sign up for wait list  The [employer] Well foundation offers assistance to help pay for medication copays.  They will cover copays for all cholesterol lowering meds, including statins, fibrates, omega-3 fish oils like Vascepa, ezetimibe, Repatha, Praluent, Nexletol, Nexlizet.  The cards are usually good for $2,500 or 12 months, whichever comes first. Go to healthwellfoundation.org Click on "Apply Now" Answer questions as to whom is applying (patient or representative) Your disease fund will be "hypercholesterolemia - Medicare access" They will ask questions about finances and which medications you are taking for cholesterol When you submit, the approval is usually within minutes.  You will need to print the card information from the site You will need to show this information to your pharmacy, they will bill your Medicare Part D plan first -then bill Health  Well --for the copay.   You can also call them at [phone], although the hold times can be quite long.     *If you need a refill on your cardiac medications before your next appointment, please call your pharmacy*   Lab Work: FASTING lab work in 3-4 months to check cholesterol   If you have labs (blood work) drawn today and your tests are completely normal, you will receive your results only by: [place] (if you have MyChart) OR A paper copy in the mail If you have any lab test that is abnormal or we need to change your treatment, we will call you to review the results.   Follow-Up: At [facility], you and your health needs are our priority.  As part of our continuing mission to provide you with exceptional heart care, we have created designated Provider Care Teams.  These Care Teams include your primary Cardiologist (physician) and Advanced Practice Providers (APPs -  Physician Assistants and Nurse Practitioners) who all work together to provide you with the care you need, when you need it.  We recommend signing up for the patient portal called "MyChart".  Sign up information is provided on this After Visit Summary.  MyChart is used to connect with patients for Virtual Visits (Telemedicine).  Patients are able to view lab/test results, encounter notes, upcoming appointments, etc.  Non-urgent messages can be sent to your provider as well.   To learn more about what you can do with MyChart, go to [email or web address].    Your next appointment:   3-4 month(s) - lipid clinic  The format for your next appointment:   In Person or Virtual  Provider:   K. Mali Hilty, MD   Other Instructions

## 2021-06-20 ENCOUNTER — Telehealth: Payer: Self-pay | Admitting: Internal Medicine

## 2021-06-20 DIAGNOSIS — L308 Other specified dermatitis: Secondary | ICD-10-CM | POA: Diagnosis not present

## 2021-06-20 DIAGNOSIS — B356 Tinea cruris: Secondary | ICD-10-CM | POA: Diagnosis not present

## 2021-06-20 DIAGNOSIS — D225 Melanocytic nevi of trunk: Secondary | ICD-10-CM | POA: Diagnosis not present

## 2021-06-20 NOTE — Telephone Encounter (Signed)
Patient has seen another doctor for his cholesterol.  They are giving him something new.   Please call him back.

## 2021-06-20 NOTE — Telephone Encounter (Signed)
PA for praluent '150mg'$ /mL submitted via CMM (Key: [id number]) [id number]

## 2021-06-20 NOTE — Telephone Encounter (Signed)
I have tried to call patient apparently phone is not working on his end I have tried multiple times if he calls back or you get in touch with him please let me know

## 2021-06-21 ENCOUNTER — Telehealth: Payer: Self-pay | Admitting: *Deleted

## 2021-06-21 NOTE — Telephone Encounter (Signed)
He was recommended to see cardiac specialist they want him to take shot twice a month and is taking vytorin. He has cholesterol reading scheduled for next month he cancelled this as the cardiac dr  dr hyltie wants him to get cholesterol checks every so often with him because of these shots. He just wanted to let us know.

## 2021-07-01 ENCOUNTER — Other Ambulatory Visit: Payer: Self-pay | Admitting: Internal Medicine

## 2021-07-01 DIAGNOSIS — I10 Essential (primary) hypertension: Secondary | ICD-10-CM

## 2021-07-01 MED ORDER — PRALUENT 150 MG/ML ~~LOC~~ SOAJ: 1.0000 | SUBCUTANEOUS | 11 refills | Status: DC

## 2021-07-01 NOTE — Telephone Encounter (Signed)
Patient approved for Praluent from 06/20/2022 - until further notice [facility] Medicare

## 2021-07-01 NOTE — Addendum Note (Signed)
Addended by: Fidel Levy on: 07/01/2021 12:04 PM   Modules accepted: Orders

## 2021-07-01 NOTE — Telephone Encounter (Signed)
Spoke with patient about med approval. Reviewed dosing schedule, possible side effects, to check fasting labs prior to 09/24/21 visit.   Rx sent to [facility]. Informed him that staff there will notify him of co-pay

## 2021-07-03 ENCOUNTER — Other Ambulatory Visit: Payer: Self-pay | Admitting: Internal Medicine

## 2021-07-03 DIAGNOSIS — Z1159 Encounter for screening for other viral diseases: Secondary | ICD-10-CM

## 2021-07-03 DIAGNOSIS — E785 Hyperlipidemia, unspecified: Secondary | ICD-10-CM

## 2021-07-03 DIAGNOSIS — R7303 Prediabetes: Secondary | ICD-10-CM

## 2021-07-03 DIAGNOSIS — I10 Essential (primary) hypertension: Secondary | ICD-10-CM | POA: Diagnosis not present

## 2021-07-04 LAB — TSH: TSH: 3.62 u[IU]/mL (ref 0.450–4.500)

## 2021-07-04 LAB — HEMOGLOBIN A1C
Est. average glucose Bld gHb Est-mCnc: 126 mg/dL
Hgb A1c MFr Bld: 6 % — ABNORMAL HIGH (ref 4.8–5.6)

## 2021-07-04 LAB — CMP14+EGFR
ALT: 18 IU/L (ref 0–44)
AST: 21 IU/L (ref 0–40)
Albumin/Globulin Ratio: 2.6 — ABNORMAL HIGH (ref 1.2–2.2)
Albumin: 4.4 g/dL (ref 3.8–4.8)
Alkaline Phosphatase: 25 IU/L — ABNORMAL LOW (ref 44–121)
BUN/Creatinine Ratio: 17 (ref 10–24)
BUN: 22 mg/dL (ref 8–27)
Bilirubin Total: 0.4 mg/dL (ref 0.0–1.2)
CO2: 26 mmol/L (ref 20–29)
Calcium: 9.4 mg/dL (ref 8.6–10.2)
Chloride: 100 mmol/L (ref 96–106)
Creatinine, Ser: 1.3 mg/dL — ABNORMAL HIGH (ref 0.76–1.27)
Globulin, Total: 1.7 g/dL (ref 1.5–4.5)
Glucose: 112 mg/dL — ABNORMAL HIGH (ref 65–99)
Potassium: 4.7 mmol/L (ref 3.5–5.2)
Sodium: 138 mmol/L (ref 134–144)
Total Protein: 6.1 g/dL (ref 6.0–8.5)
eGFR: 60 mL/min/{1.73_m2} (ref >59)

## 2021-07-04 LAB — LIPID PANEL
Chol/HDL Ratio: 5.4 ratio — ABNORMAL HIGH (ref 0.0–5.0)
Cholesterol, Total: 232 mg/dL — ABNORMAL HIGH (ref 100–199)
HDL: 43 mg/dL (ref >39)
LDL Chol Calc (NIH): 162 mg/dL — ABNORMAL HIGH (ref 0–99)
Triglycerides: 146 mg/dL (ref 0–149)
VLDL Cholesterol Cal: 27 mg/dL (ref 5–40)

## 2021-07-04 LAB — CBC WITH DIFFERENTIAL/PLATELET
Basophils Absolute: 0.1 10*3/uL (ref 0.0–0.2)
Basos: 1 %
EOS (ABSOLUTE): 0.2 10*3/uL (ref 0.0–0.4)
Eos: 2 %
Hematocrit: 42.2 % (ref 37.5–51.0)
Hemoglobin: 13.7 g/dL (ref 13.0–17.7)
Immature Grans (Abs): 0 10*3/uL (ref 0.0–0.1)
Immature Granulocytes: 0 %
Lymphocytes Absolute: 2.3 10*3/uL (ref 0.7–3.1)
Lymphs: 31 %
MCH: 30.6 pg (ref 26.6–33.0)
MCHC: 32.5 g/dL (ref 31.5–35.7)
MCV: 94 fL (ref 79–97)
Monocytes Absolute: 0.8 10*3/uL (ref 0.1–0.9)
Monocytes: 11 %
Neutrophils Absolute: 4 10*3/uL (ref 1.4–7.0)
Neutrophils: 55 %
Platelets: 162 10*3/uL (ref 150–450)
RBC: 4.47 x10E6/uL (ref 4.14–5.80)
RDW: 12.1 % (ref 11.6–15.4)
WBC: 7.4 10*3/uL (ref 3.4–10.8)

## 2021-07-04 LAB — HEPATITIS C ANTIBODY: Hep C Virus Ab: <0.1 s/co ratio (ref 0.0–0.9)

## 2021-07-14 ENCOUNTER — Other Ambulatory Visit: Payer: Self-pay | Admitting: Internal Medicine

## 2021-07-14 DIAGNOSIS — G4700 Insomnia, unspecified: Secondary | ICD-10-CM

## 2021-07-15 ENCOUNTER — Telehealth: Payer: Self-pay

## 2021-07-15 NOTE — Telephone Encounter (Signed)
Pt called requesting refill for sleep med.  Does not understand why the refill was not approved.  He has appt tomorrow but would like refill today.

## 2021-07-16 ENCOUNTER — Encounter: Payer: Self-pay | Admitting: Internal Medicine

## 2021-07-16 ENCOUNTER — Other Ambulatory Visit: Payer: Self-pay

## 2021-07-16 ENCOUNTER — Ambulatory Visit (INDEPENDENT_AMBULATORY_CARE_PROVIDER_SITE_OTHER): Payer: Medicare Other | Admitting: Internal Medicine

## 2021-07-16 VITALS — BP 124/78 | HR 61 | Temp 98.0°F | Resp 20 | Ht 73.0 in | Wt 216.0 lb

## 2021-07-16 DIAGNOSIS — F4310 Post-traumatic stress disorder, unspecified: Secondary | ICD-10-CM | POA: Diagnosis not present

## 2021-07-16 DIAGNOSIS — J42 Unspecified chronic bronchitis: Secondary | ICD-10-CM

## 2021-07-16 DIAGNOSIS — E785 Hyperlipidemia, unspecified: Secondary | ICD-10-CM | POA: Diagnosis not present

## 2021-07-16 DIAGNOSIS — R21 Rash and other nonspecific skin eruption: Secondary | ICD-10-CM

## 2021-07-16 DIAGNOSIS — F419 Anxiety disorder, unspecified: Secondary | ICD-10-CM

## 2021-07-16 DIAGNOSIS — I10 Essential (primary) hypertension: Secondary | ICD-10-CM

## 2021-07-16 DIAGNOSIS — I483 Typical atrial flutter: Secondary | ICD-10-CM

## 2021-07-16 MED ORDER — ALPRAZOLAM 0.5 MG PO TABS: 0.2500 mg | ORAL_TABLET | Freq: Two times a day (BID) | ORAL | 1 refills | Status: DC | PRN

## 2021-07-16 MED ORDER — NYSTATIN 100000 UNIT/GM EX POWD: 1.0000 "application " | Freq: Three times a day (TID) | CUTANEOUS | 0 refills | Status: DC
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
## 2021-07-16 NOTE — Assessment & Plan Note (Signed)
BP Readings from Last 1 Encounters:  07/16/21 124/78   Well-controlled with Hyzaar and Metoprolol Counseled for compliance with the medications Advised DASH diet and moderate exercise/walking, at least 150 mins/week

## 2021-07-16 NOTE — Assessment & Plan Note (Signed)
Concern for candidal infection, advised to use Nystatin powder Buttock rash likely tinea - now improved with Clotrimazole

## 2021-07-16 NOTE — Assessment & Plan Note (Signed)
Quit smoking in 1999 On Symbicort and Albuterol Advised to comply with Symbicort to avoid frequent exacerbations

## 2021-07-16 NOTE — Assessment & Plan Note (Signed)
Concern for PTSD Would like to start SSRI - Zoloft, but patient has been resistant to any antidepressant. Will readdress in the next visit.

## 2021-07-16 NOTE — Assessment & Plan Note (Signed)
S/p cardiac ablation In sinus rhythm currently On Metoprolol

## 2021-07-16 NOTE — Assessment & Plan Note (Signed)
Followed by lipid clinic On Praluent now
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
## 2021-07-16 NOTE — Patient Instructions (Signed)
Please start taking Xanax as needed for anxiety or panic episode.  Please continue taking other medications as prescribed.  Please continue to follow DASH diet and ambulate as tolerated.

## 2021-08-05 ENCOUNTER — Other Ambulatory Visit: Payer: Self-pay

## 2021-08-05 ENCOUNTER — Ambulatory Visit (INDEPENDENT_AMBULATORY_CARE_PROVIDER_SITE_OTHER): Payer: Medicare Other

## 2021-08-05 ENCOUNTER — Telehealth: Payer: Self-pay | Admitting: *Deleted

## 2021-08-05 VITALS — Ht 73.0 in | Wt 216.0 lb

## 2021-08-05 DIAGNOSIS — J449 Chronic obstructive pulmonary disease, unspecified: Secondary | ICD-10-CM

## 2021-08-05 DIAGNOSIS — E785 Hyperlipidemia, unspecified: Secondary | ICD-10-CM

## 2021-08-05 DIAGNOSIS — Z0000 Encounter for general adult medical examination without abnormal findings: Secondary | ICD-10-CM | POA: Diagnosis not present

## 2021-08-05 NOTE — Patient Instructions (Signed)
Dennis [name] , Thank you for taking time to come for your Medicare Wellness Visit. I appreciate your ongoing commitment to your health goals. Please review the following plan we discussed and let me know if I can assist you in the future.   Screening recommendations/referrals: Colonoscopy: 01/28/2021, Repeat in 10 years  Recommended yearly ophthalmology/optometry visit for glaucoma screening and checkup Recommended yearly dental visit for hygiene and checkup  Vaccinations: Influenza vaccine: 07/26/2020, due in fall Pneumococcal vaccine: 08/21/2015 06/24/2009 Tdap vaccine: Due Shingles vaccine: Shingrix discussed. Please contact your pharmacy for coverage information.     Covid-19: 02/17/2020 01/19/2020  Advanced directives: Please bring a copy of your health care power of attorney and living will to the office to be added to your chart at your convenience.   Conditions/risks identified: Aim for 30 minutes of exercise or brisk walking each day, drink 6-8 glasses of water and eat lots of fruits and vegetables.   Next appointment: Follow up in one year for your annual wellness visit.  Preventive Care 14 Years and Older, Male  Preventive care refers to lifestyle choices and visits with your health care provider that can promote health and wellness. What does preventive care include? A yearly physical exam. This is also called an annual well check. Dental exams once or twice a year. Routine eye exams. Ask your health care provider how often you should have your eyes checked. Personal lifestyle choices, including: Daily care of your teeth and gums. Regular physical activity. Eating a healthy diet. Avoiding tobacco and drug use. Limiting alcohol use. Practicing safe sex. Taking low doses of aspirin every day. Taking vitamin and mineral supplements as recommended by your health care provider. What happens during an annual well check? The services and screenings done by your health care  provider during your annual well check will depend on your age, overall health, lifestyle risk factors, and family history of disease. Counseling  Your health care provider may ask you questions about your: Alcohol use. Tobacco use. Drug use. Emotional well-being. Home and relationship well-being. Sexual activity. Eating habits. History of falls. Memory and ability to understand (cognition). Work and work Statistician. Screening  You may have the following tests or measurements: Height, weight, and BMI. Blood pressure. Lipid and cholesterol levels. These may be checked every 5 years, or more frequently if you are over 37 years old. Skin check. Lung cancer screening. You may have this screening every year starting at age 82 if you have a 30-pack-year history of smoking and currently smoke or have quit within the past 15 years. Fecal occult blood test (FOBT) of the stool. You may have this test every year starting at age 37. Flexible sigmoidoscopy or colonoscopy. You may have a sigmoidoscopy every 5 years or a colonoscopy every 10 years starting at age 27. Prostate cancer screening. Recommendations will vary depending on your family history and other risks. Hepatitis C blood test. Hepatitis B blood test. Sexually transmitted disease (STD) testing. Diabetes screening. This is done by checking your blood sugar (glucose) after you have not eaten for a while (fasting). You may have this done every 1-3 years. Abdominal aortic aneurysm (AAA) screening. You may need this if you are a current or former smoker. Osteoporosis. You may be screened starting at age 69 if you are at high risk. Talk with your health care provider about your test results, treatment options, and if necessary, the need for more tests. Vaccines  Your health care provider may recommend certain vaccines, such  as: Influenza vaccine. This is recommended every year. Tetanus, diphtheria, and acellular pertussis (Tdap, Td)  vaccine. You may need a Td booster every 10 years. Zoster vaccine. You may need this after age 49. Pneumococcal 13-valent conjugate (PCV13) vaccine. One dose is recommended after age 26. Pneumococcal polysaccharide (PPSV23) vaccine. One dose is recommended after age 31. Talk to your health care provider about which screenings and vaccines you need and how often you need them. This information is not intended to replace advice given to you by your health care provider. Make sure you discuss any questions you have with your health care provider. Document Released: 12/07/2015 Document Revised: 07/30/2016 Document Reviewed: 09/11/2015 Elsevier Interactive Patient Education  2017 [place] Prevention in the Home Falls can cause injuries. They can happen to people of all ages. There are many things you can do to make your home safe and to help prevent falls. What can I do on the outside of my home? Regularly fix the edges of walkways and driveways and fix any cracks. Remove anything that might make you trip as you walk through a door, such as a raised step or threshold. Trim any bushes or trees on the path to your home. Use bright outdoor lighting. Clear any walking paths of anything that might make someone trip, such as rocks or tools. Regularly check to see if handrails are loose or broken. Make sure that both sides of any steps have handrails. Any raised decks and porches should have guardrails on the edges. Have any leaves, snow, or ice cleared regularly. Use sand or salt on walking paths during winter. Clean up any spills in your garage right away. This includes oil or grease spills. What can I do in the bathroom? Use night lights. Install grab bars by the toilet and in the tub and shower. Do not use towel bars as grab bars. Use non-skid mats or decals in the tub or shower. If you need to sit down in the shower, use a plastic, non-slip stool. Keep the floor dry. Clean up any  water that spills on the floor as soon as it happens. Remove soap buildup in the tub or shower regularly. Attach bath mats securely with double-sided non-slip rug tape. Do not have throw rugs and other things on the floor that can make you trip. What can I do in the bedroom? Use night lights. Make sure that you have a light by your bed that is easy to reach. Do not use any sheets or blankets that are too big for your bed. They should not hang down onto the floor. Have a firm chair that has side arms. You can use this for support while you get dressed. Do not have throw rugs and other things on the floor that can make you trip. What can I do in the kitchen? Clean up any spills right away. Avoid walking on wet floors. Keep items that you use a lot in easy-to-reach places. If you need to reach something above you, use a strong step stool that has a grab bar. Keep electrical cords out of the way. Do not use floor polish or wax that makes floors slippery. If you must use wax, use non-skid floor wax. Do not have throw rugs and other things on the floor that can make you trip. What can I do with my stairs? Do not leave any items on the stairs. Make sure that there are handrails on both sides of the stairs and use  them. Fix handrails that are broken or loose. Make sure that handrails are as long as the stairways. Check any carpeting to make sure that it is firmly attached to the stairs. Fix any carpet that is loose or worn. Avoid having throw rugs at the top or bottom of the stairs. If you do have throw rugs, attach them to the floor with carpet tape. Make sure that you have a light switch at the top of the stairs and the bottom of the stairs. If you do not have them, ask someone to add them for you. What else can I do to help prevent falls? Wear shoes that: Do not have high heels. Have rubber bottoms. Are comfortable and fit you well. Are closed at the toe. Do not wear sandals. If you use a  stepladder: Make sure that it is fully opened. Do not climb a closed stepladder. Make sure that both sides of the stepladder are locked into place. Ask someone to hold it for you, if possible. Clearly mark and make sure that you can see: Any grab bars or handrails. First and last steps. Where the edge of each step is. Use tools that help you move around (mobility aids) if they are needed. These include: Canes. Walkers. Scooters. Crutches. Turn on the lights when you go into a dark area. Replace any light bulbs as soon as they burn out. Set up your furniture so you have a clear path. Avoid moving your furniture around. If any of your floors are uneven, fix them. If there are any pets around you, be aware of where they are. Review your medicines with your doctor. Some medicines can make you feel dizzy. This can increase your chance of falling. Ask your doctor what other things that you can do to help prevent falls. This information is not intended to replace advice given to you by your health care provider. Make sure you discuss any questions you have with your health care provider. Document Released: 09/06/2009 Document Revised: 04/17/2016 Document Reviewed: 12/15/2014 Elsevier Interactive Patient Education  2017 [employer].

## 2021-08-05 NOTE — Progress Notes (Signed)
Subjective:   Dennis [name] is a 70 y.o. male who presents for Medicare Annual/Subsequent preventive examination. Virtual Visit via Telephone Note  I connected with  Dennis [name] on 08/05/21 at 10:20 AM EDT by telephone and verified that I am speaking with the correct person using two identifiers.  Location: Patient: Home Provider: [place] participating in the virtual visit: patient/Nurse Health Advisor   I discussed the limitations, risks, security and privacy concerns of performing an evaluation and management service by telephone and the availability of in person appointments. The patient expressed understanding and agreed to proceed.  Interactive audio and video telecommunications were attempted between this nurse and patient, however failed, due to patient having technical difficulties OR patient did not have access to video capability.  We continued and completed visit with audio only.  Some vital signs may be absent or patient reported.   Chriss Driver, LPN  Review of Systems     Cardiac Risk Factors include: advanced age (>55mn, >>62women);dyslipidemia;hypertension;male gender;sedentary lifestyle     Objective:    Today's Vitals   08/05/21 1020 08/05/21 1022  Weight: 216 lb (98 kg)   Height: '6\' 1"'$  (1.854 m)   PainSc:  0-No pain   Body mass index is 28.5 kg/m.  Advanced Directives 08/05/2021 01/28/2021 03/25/2020 03/25/2020 11/21/2016 11/21/2016 12/19/2015  Does Patient Have a Medical Advance Directive? Yes No No No Yes Yes Yes  Type of Advance Directive Living will - - - Living will Living will [place] will  Does patient want to make changes to medical advance directive? - - - - No - Patient declined - No - Patient declined  Copy of [place]? - - - - - - No - copy requested  Would patient like information on creating a medical advance directive? - No - Patient declined No - Patient  declined No - Patient declined - - -    Current Medications (verified) Outpatient Encounter Medications as of 08/05/2021  Medication Sig   albuterol (PROVENTIL) (2.5 MG/3ML) 0.083% nebulizer solution Take 3 mLs (2.5 mg total) by nebulization every 6 (six) hours as needed for wheezing or shortness of breath.   albuterol (VENTOLIN HFA) 108 (90 Base) MCG/ACT inhaler Inhale 2 puffs into the lungs every 4 (four) hours as needed for wheezing.   Alirocumab (PRALUENT) 150 MG/ML SOAJ Inject 1 Dose into the skin every 14 (fourteen) days.   ALPRAZolam (XANAX) 0.5 MG tablet Take 0.5 tablets (0.25 mg total) by mouth 2 (two) times daily as needed for anxiety.   aspirin EC 81 MG tablet Take 1 tablet (81 mg total) by mouth daily. (Patient taking differently: Take 81 mg by mouth 2 (two) times a week.)   budesonide-formoterol (SYMBICORT) 160-4.5 MCG/ACT inhaler TAKE 2 PUFFS FIRST THING IN MORNING AND THEN ANOTHER 2 PUFFS ABOUT 12 HOURS LATER. (Patient taking differently: Inhale 2 puffs into the lungs in the morning and at bedtime. TAKE 2 PUFFS FIRST THING IN MORNING AND THEN ANOTHER 2 PUFFS ABOUT 12 HOURS LATER.)   ezetimibe-simvastatin (VYTORIN) 10-40 MG tablet Take 1 tablet by mouth at bedtime. TAKE 1 TABLET BY MOUTH EVERYDAY AT BEDTIME   fenofibrate 160 MG tablet TAKE 1 TABLET BY MOUTH EVERY DAY   fish oil-omega-3 fatty acids 1000 MG capsule Take 1 g by mouth in the morning and at bedtime.   losartan-hydrochlorothiazide (HYZAAR) 100-25 MG tablet TAKE 1 TABLET BY MOUTH EVERY DAY  metoprolol tartrate (LOPRESSOR) 50 MG tablet TAKE 1 TABLET BY MOUTH TWICE A DAY   mupirocin ointment (BACTROBAN) 2 % Apply 1 application topically 2 (two) times daily. (Patient taking differently: Apply 1 application topically 2 (two) times daily as needed (wound care).)   nitroGLYCERIN (NITROSTAT) 0.4 MG SL tablet Place 1 tablet (0.4 mg total) under the tongue every 5 (five) minutes as needed.   nystatin (MYCOSTATIN/NYSTOP) powder Apply  1 application topically 3 (three) times daily.   tamsulosin (FLOMAX) 0.4 MG CAPS capsule TAKE 1 CAPSULE BY MOUTH EVERY DAY   zolpidem (AMBIEN) 10 MG tablet TAKE 1 TABLET BY MOUTH AT BEDTIME AS NEEDED FOR SLEEP.   isosorbide mononitrate (IMDUR) 30 MG 24 hr tablet Take 0.5 tablets (15 mg total) by mouth every evening. (Patient not taking: Reported on 06/19/2021)   meloxicam (MOBIC) 7.5 MG tablet Take 7.5 mg by mouth daily as needed.   methocarbamol (ROBAXIN) 500 MG tablet TAKE 1 TABLET BY MOUTH EVERY 8 HOURS AS NEEDED FOR MUSCLE SPASMS. (Patient not taking: Reported on 08/05/2021)   [DISCONTINUED] augmented betamethasone dipropionate (DIPROLENE-AF) 0.05 % cream Apply topically 2 (two) times daily as needed.   [DISCONTINUED] ketoconazole (NIZORAL) 2 % cream Apply topically 2 (two) times daily as needed.   [DISCONTINUED] meloxicam (MOBIC) 15 MG tablet Take 0.5 tablets (7.5 mg total) by mouth daily as needed for pain. (Patient not taking: Reported on 08/05/2021)   No facility-administered encounter medications on file as of 08/05/2021.    Allergies (verified) Ace inhibitors, Enalapril, Clotrimazole-betamethasone, and Prednisone   History: Past Medical History:  Diagnosis Date   Atrial flutter ([place])    Diagnosed by ECG August 2015   Colitis    COPD (chronic obstructive pulmonary disease) ([place])    Coronary atherosclerosis of native coronary artery    Multivessel status post CABG   Essential hypertension    Hyperlipidemia    Insomnia    Past Surgical History:  Procedure Laterality Date   COLONOSCOPY  01/29/2005   Dr. Gala Romney; rectal polyp s/p polypectomy, otherwise normal.  Pathology with tubulovillous adenoma.   COLONOSCOPY  02/06/2008   Dr. Gala Romney; minimal internal hemorrhoids, diminutive polyp in the splenic flexure s/p cold biopsy removal, otherwise normal.  Pathology with benign polypoid colonic mucosa.   COLONOSCOPY WITH PROPOFOL N/A 08/30/2015   Procedure: COLONOSCOPY WITH PROPOFOL at  cecum at 0814; withdrawal time=8 minutes;  Surgeon: Daneil Dolin, MD; internal hemorrhoids-likely source of hematochezia, otherwise normal exam.  Repeat in 5 years.   COLONOSCOPY WITH PROPOFOL N/A 01/28/2021   Procedure: COLONOSCOPY WITH PROPOFOL;  Surgeon: Daneil Dolin, MD;  Location: [facility];  Service: Endoscopy;  Laterality: N/A;  am appt   CORONARY ARTERY BYPASS GRAFT  2005   Dr. Lucianne Lei Trigt: LIMA to LAD, right radial to circumflex, SVG to RCA   CORONARY ARTERY BYPASS GRAFT  10/16/2004   ELECTROPHYSIOLOGIC STUDY N/A 11/21/2016   Procedure: A-Flutter Ablation;  Surgeon: Evans Lance, MD;  Location: [facility];  Service: Cardiovascular;  Laterality: N/A;   TEE WITHOUT CARDIOVERSION N/A 11/21/2016   Procedure: TRANSESOPHAGEAL ECHOCARDIOGRAM (TEE);  Surgeon: Sanda Klein, MD;  Location: [facility];  Service: Cardiovascular;  Laterality: N/A;   Family History  Problem Relation Age of Onset   Hypertension Mother    Alcohol abuse Mother    Alcohol abuse Father    Colon cancer Neg Hx    Social History   Socioeconomic History   Marital status: Married    Spouse name:  Not on file   Number of children: 3   Years of education: Not on file   Highest education level: Not on file  Occupational History   Not on file  Tobacco Use   Smoking status: Former    Packs/day: 2.00    Years: 27.00    Pack years: 54.00    Types: Cigarettes    Start date: 02/01/1971    Quit date: 01/31/1998    Years since quitting: 23.5   Smokeless tobacco: Current    Types: Chew  Vaping Use   Vaping Use: Never used  Substance and Sexual Activity   Alcohol use: Yes    Alcohol/week: 0.0 standard drinks    Comment: Drinks beer daily, typically 2-4 a day. Also 4 oz red wine an evening.   Drug use: No   Sexual activity: Not on file  Other Topics Concern   Not on file  Social History Narrative   Lives with wife, Anderson Malta   Social Determinants of Health   Financial Resource Strain: Medium  Risk   Difficulty of Paying Living Expenses: Somewhat hard  Food Insecurity: No Food Insecurity   Worried About Charity fundraiser in the Last Year: Never true   Arboriculturist in the Last Year: Never true  Transportation Needs: No Transportation Needs   Lack of Transportation (Medical): No   Lack of Transportation (Non-Medical): No  Physical Activity: Insufficiently Active   Days of Exercise per Week: 3 days   Minutes of Exercise per Session: 30 min  Stress: No Stress Concern Present   Feeling of Stress : Only a little  Social Connections: Moderately Isolated   Frequency of Communication with Friends and Family: More than three times a week   Frequency of Social Gatherings with Friends and Family: More than three times a week   Attends Religious Services: Never   Marine scientist or Organizations: No   Attends Music therapist: Never   Marital Status: Married    Tobacco Counseling Ready to quit: No Counseling given: Not Answered   Clinical Intake:  Pre-visit preparation completed: Yes  Pain : No/denies pain Pain Score: 0-No pain     BMI - recorded: 28.5 Nutritional Status: BMI 25 -29 Overweight Nutritional Risks: None Diabetes: No  How often do you need to have someone help you when you read instructions, pamphlets, or other written materials from your doctor or pharmacy?: 1 - Never  Diabetic?NO  Interpreter Needed?: No  Information entered by :: Randal Buba, LPN   Activities of Daily Living In your present state of health, do you have any difficulty performing the following activities: 08/05/2021  Hearing? Y  Vision? N  Difficulty concentrating or making decisions? N  Walking or climbing stairs? N  Dressing or bathing? N  Doing errands, shopping? N  Preparing Food and eating ? N  Using the Toilet? N  In the past six months, have you accidently leaked urine? N  Do you have problems with loss of bowel control? N  Managing your  Medications? N  Managing your Finances? N  Housekeeping or managing your Housekeeping? N  Some recent data might be hidden    Patient Care Team: Lindell Spar, MD as PCP - General (Internal Medicine) Satira Sark, MD as PCP - Cardiology (Cardiology) Satira Sark, MD as Consulting Physician (Cardiology) Debara Pickett Nadean Corwin, MD as Consulting Physician (Cardiology)  Indicate any recent Medical Services you may have received from other  than [facility] providers in the past year (date may be approximate).     Assessment:   This is a routine wellness examination for Dennis [name].  Hearing/Vision screen Hearing Screening - Comments:: Pt states he does have hearing issues due to work environment. Does not wear hearing aids. Vision Screening - Comments:: Readers. Eye exam 2 years ago. Goes to a &quot;doctor in [place].&quot;  Dietary issues and exercise activities discussed: Current Exercise Habits: Home exercise routine, Type of exercise: walking, Time (Minutes): 30, Frequency (Times/Week): 3, Weekly Exercise (Minutes/Week): 90, Intensity: Mild, Exercise limited by: cardiac condition(s);respiratory conditions(s) (Chronic Kidney dz, Aortic aneurysm.)   Goals Addressed             This Visit's Progress    Exercise 3x per week (30 min per time)       Pt states he would like to exercise more. "Still be living and looking at [facility] creation."       Depression Screen PHQ 2/9 Scores 08/05/2021 08/05/2021 07/16/2021 01/25/2021 01/02/2021 01/02/2021 09/25/2020  PHQ - 2 Score 0 2 2 0 0 0 0  PHQ- 9 Score - 2 8 - - - -  Exception Documentation - - - - - - -    Fall Risk Fall Risk  08/05/2021 07/16/2021 02/15/2021 01/25/2021 01/02/2021  Falls in the past year? 0 0 0 0 0  Number falls in past yr: 0 0 0 0 0  Injury with Fall? 0 0 0 0 0  Risk for fall due to : - No Fall Risks - No Fall Risks No Fall Risks  Risk for fall due to: Comment - - - - -  Follow up - Falls evaluation completed - Falls evaluation  completed Falls evaluation completed    FALL RISK PREVENTION PERTAINING TO THE HOME:  Any stairs in or around the home? No  If so, are there any without handrails? No  Home free of loose throw rugs in walkways, pet beds, electrical cords, etc? Yes  Adequate lighting in your home to reduce risk of falls? Yes   ASSISTIVE DEVICES UTILIZED TO PREVENT FALLS:  Life alert? No  Use of a cane, walker or w/c? No  Grab bars in the bathroom? Yes  Shower chair or bench in shower? No  Elevated toilet seat or a handicapped toilet? No   TIMED UP AND GO:  Was the test performed? No . PHONE VISIT.   Cognitive Function: Normal cognitive status assessed by direct observation by this Nurse Health Advisor. No abnormalities found.          Immunizations Immunization History  Administered Date(s) Administered   Influenza, High Dose Seasonal PF 08/05/2019   Influenza,inj,Quad PF,6+ Mos 08/21/2015, 07/09/2017, 08/19/2018   Influenza-Unspecified 09/24/2012, 09/24/2013, 08/07/2016, 07/26/2020   Moderna Sars-Covid-2 Vaccination 01/19/2020, 02/17/2020   Pneumococcal Conjugate-13 08/21/2015   Pneumococcal Polysaccharide-23 06/24/2009    TDAP status: Due, Education has been provided regarding the importance of this vaccine. Advised may receive this vaccine at local pharmacy or Health Dept. Aware to provide a copy of the vaccination record if obtained from local pharmacy or Health Dept. Verbalized acceptance and understanding.  Flu Vaccine status: Due, Education has been provided regarding the importance of this vaccine. Advised may receive this vaccine at local pharmacy or Health Dept. Aware to provide a copy of the vaccination record if obtained from local pharmacy or Health Dept. Verbalized acceptance and understanding.  Pneumococcal vaccine status: Up to date  Covid-19 vaccine status: Information provided on how to obtain vaccines.  Qualifies for Shingles Vaccine? Yes   Zostavax completed No    Shingrix Completed?: No.    Education has been provided regarding the importance of this vaccine. Patient has been advised to call insurance company to determine out of pocket expense if they have not yet received this vaccine. Advised may also receive vaccine at local pharmacy or Health Dept. Verbalized acceptance and understanding.  Screening Tests Health Maintenance  Topic Date Due   TETANUS/TDAP  Never done   Zoster Vaccines- Shingrix (1 of 2) Never done   PNA vac Low Risk Adult (2 of 2 - PPSV23) 01/05/2018   COVID-19 Vaccine (3 - Moderna risk series) 03/16/2020   INFLUENZA VACCINE  06/24/2021   COLONOSCOPY (Pts 45-99yr Insurance coverage will need to be confirmed)  01/29/2031   Hepatitis C Screening  Completed   HPV VACCINES  Aged Out    Health Maintenance  Health Maintenance Due  Topic Date Due   TETANUS/TDAP  Never done   Zoster Vaccines- Shingrix (1 of 2) Never done   PNA vac Low Risk Adult (2 of 2 - PPSV23) 01/05/2018   COVID-19 Vaccine (3 - Moderna risk series) 03/16/2020   INFLUENZA VACCINE  06/24/2021    Colorectal cancer screening: Type of screening: Colonoscopy. Completed 01/28/2021. Repeat every 10 years  Lung Cancer Screening: (Low Dose CT Chest recommended if Age 68-80years, 30 pack-year currently smoking OR have quit w/in 15years.) does not qualify.    Additional Screening:  Hepatitis C Screening: does qualify; Completed 07/03/2021  Vision Screening: Recommended annual ophthalmology exams for early detection of glaucoma and other disorders of the eye. Is the patient up to date with their annual eye exam?  No  Who is the provider or what is the name of the office in which the patient attends annual eye exams? Pt states, "some doctor in [place]" If pt is not established with a provider, would they like to be referred to a provider to establish care? No .   Dental Screening: Recommended annual dental exams for proper oral hygiene  Community Resource  Referral / Chronic Care Management: CRR required this visit?  No   CCM required this visit?  Yes      Plan:     I have personally reviewed and noted the following in the patient's chart:   Medical and social history Use of alcohol, tobacco or illicit drugs  Current medications and supplements including opioid prescriptions. Patient is not currently taking opioid prescriptions. Functional ability and status Nutritional status Physical activity Advanced directives List of other physicians Hospitalizations, surgeries, and ER visits in previous 12 months Vitals Screenings to include cognitive, depression, and falls Referrals and appointments  In addition, I have reviewed and discussed with patient certain preventive protocols, quality metrics, and best practice recommendations. A written personalized care plan for preventive services as well as general preventive health recommendations were provided to patient.     MChriss Driver LPN   [id number]  Nurse Notes: Pt declines Tdap, states, "We get that when we get hurt." Pt declines Shingles vaccine. Pt states he has had Shingles twice. Pt would like referral to Pharmacy to see if he can get help in cost of Symbicort and Praluent injection. Pt states he can pay for them but the Praluent out of pocket if $500.00. Referral sent.

## 2021-08-05 NOTE — Chronic Care Management (AMB) (Signed)
  Chronic Care Management   Outreach Note  08/05/2021 Name: HEMANT LAFON MRN: [id number] DOB: [birth year]  [name] is a 70 y.o. year old male who is a primary care patient of Lindell Spar, MD. I reached out to [name] by phone today in response to a referral sent by Mr. Korede Bitler Gossard's PCP, Dr. Posey Pronto.      An unsuccessful telephone outreach was attempted today. The patient was referred to the case management team for assistance with care management and care coordination.   Follow Up Plan: A HIPAA compliant phone message was left for the patient providing contact information and requesting a return call.  The care management team will reach out to the patient again over the next 7 days.  If patient returns call to provider office, please advise to call [place]* at [phone].*  [facility] Management  Direct Dial: [phone]

## 2021-08-10 DIAGNOSIS — Z23 Encounter for immunization: Secondary | ICD-10-CM | POA: Diagnosis not present

## 2021-08-13 NOTE — Chronic Care Management (AMB) (Signed)
  Chronic Care Management   Outreach Note  08/13/2021 Name: Dennis [name] MRN: [id number] DOB: 1951/05/02  Dennis [name] is a 70 y.o. year old male who is a primary care patient of Lindell Spar, MD. I reached out to Dennis [name] by phone today in response to a referral sent by [name]'s PCP, Dr. Posey Pronto      A second unsuccessful telephone outreach was attempted today. The patient was referred to the case management team for assistance with care management and care coordination.   Follow Up Plan: A HIPAA compliant phone message was left for the patient providing contact information and requesting a return call. The care management team will reach out to the patient again over the next 7 days.  If patient returns call to provider office, please advise to call [place]  at [phone].  [facility] Management  Direct Dial: [phone]

## 2021-08-13 NOTE — Chronic Care Management (AMB) (Signed)
  Chronic Care Management   Note  08/13/2021 Name: Dennis [name] MRN: [id number] DOB: 1951-06-19  Dennis [name] is a 70 y.o. year old male who is a primary care patient of Lindell Spar, MD. I reached out to Dennis [name] by phone today in response to a referral sent by Dennis [name]'s PCP, Dr. Posey Pronto.      [name] was given information about Chronic Care Management services today including:  CCM service includes personalized support from designated clinical staff supervised by his physician, including individualized plan of care and coordination with other care providers 24/7 contact phone numbers for assistance for urgent and routine care needs. Service will only be billed when office clinical staff spend 20 minutes or more in a month to coordinate care. Only one practitioner may furnish and bill the service in a calendar month. The patient may stop CCM services at any time (effective at the end of the month) by phone call to the office staff. The patient will be responsible for cost sharing (co-pay) of up to 20% of the service fee (after annual deductible is met).  Patient agreed to services and verbal consent obtained.   Follow up plan: Face to Face appointment with care management team member scheduled for: 09/03/21  [place] Management  Direct Dial: [phone]

## 2021-08-14 ENCOUNTER — Other Ambulatory Visit: Payer: Self-pay | Admitting: Internal Medicine

## 2021-08-14 DIAGNOSIS — E785 Hyperlipidemia, unspecified: Secondary | ICD-10-CM

## 2021-09-03 ENCOUNTER — Other Ambulatory Visit: Payer: Self-pay

## 2021-09-03 ENCOUNTER — Ambulatory Visit (INDEPENDENT_AMBULATORY_CARE_PROVIDER_SITE_OTHER): Payer: Medicare Other | Admitting: Pharmacist

## 2021-09-03 DIAGNOSIS — J449 Chronic obstructive pulmonary disease, unspecified: Secondary | ICD-10-CM

## 2021-09-03 DIAGNOSIS — E785 Hyperlipidemia, unspecified: Secondary | ICD-10-CM

## 2021-09-03 NOTE — Chronic Care Management (AMB) (Addendum)
Chronic Care Management Pharmacy Note  09/03/2021 Name:  Dennis [name] MRN:  [id number] DOB:  04/02/1951  Summary:  Completed financial workup for Praluent and Symbicort per patient request. Unfortunately, at this time, he is not eligible for Medicare low income subsidy or manufacturer patient assistance programs. Discussed exploring Medicare plans during open enrollment to determine the best coverage for his specific needs. Patient states that he is able to afford his medications for now.   Subjective: Dennis [name] is an 70 y.o. year old male who is a primary patient of Lindell Spar, MD.  The CCM team was consulted for assistance with disease management and care coordination needs.    Engaged with patient face to face for initial visit in response to provider referral for pharmacy case management and/or care coordination services.   Consent to Services:  The patient was given the following information about Chronic Care Management services today, agreed to services, and gave verbal consent: 1. CCM service includes personalized support from designated clinical staff supervised by the primary care provider, including individualized plan of care and coordination with other care providers 2. 24/7 contact phone numbers for assistance for urgent and routine care needs. 3. Service will only be billed when office clinical staff spend 20 minutes or more in a month to coordinate care. 4. Only one practitioner may furnish and bill the service in a calendar month. 5.The patient may stop CCM services at any time (effective at the end of the month) by phone call to the office staff. 6. The patient will be responsible for cost sharing (co-pay) of up to 20% of the service fee (after annual deductible is met). Patient agreed to services and consent obtained.  Patient Care Team: Lindell Spar, MD as PCP - General (Internal Medicine) Satira Sark, MD as PCP - Cardiology (Cardiology) Satira Sark, MD as Consulting Physician (Cardiology) Pixie Casino, MD as Consulting Physician (Cardiology) Beryle Lathe, [facility] (Pharmacist)  Objective:  Lab Results  Component Value Date   CREATININE 1.30 (H) 07/03/2021   CREATININE 1.40 (H) 12/26/2020   CREATININE 1.35 (H) 07/23/2020    Lab Results  Component Value Date   HGBA1C 6.0 (H) 07/03/2021   Last diabetic Eye exam: No results found for: HMDIABEYEEXA  Last diabetic Foot exam: No results found for: HMDIABFOOTEX      Component Value Date/Time   CHOL 232 (H) 07/03/2021 0820   TRIG 146 07/03/2021 0820   HDL 43 07/03/2021 0820   CHOLHDL 5.4 (H) 07/03/2021 0820   CHOLHDL 9.4 (H) 01/11/2018 0833   VLDL 35 08/25/2014 0922   LDLCALC 162 (H) 07/03/2021 0820   [place]  01/11/2018 1856     Comment:     . LDL cholesterol not calculated. Triglyceride levels greater than 400 mg/dL invalidate calculated LDL results. . Reference range: <100 . Desirable range <100 mg/dL for primary prevention;   <70 mg/dL for patients with CHD or diabetic patients  with > or = 2 CHD risk factors. Marland Kitchen LDL-C is now calculated using the Martin-Hopkins  calculation, which is a validated novel method providing  better accuracy than the Friedewald equation in the  estimation of LDL-C.  Cresenciano Genre et al. Annamaria Helling. 3149;702(63): 2061-2068  (http://education.[facility].com/faq/FAQ164)     Hepatic Function Latest Ref Rng & Units 07/03/2021 12/26/2020 12/26/2020  Total Protein 6.0 - 8.5 g/dL 6.1 6.7 6.9  Albumin 3.8 - 4.8 g/dL 4.4 4.5 4.6  AST 0 - 40 IU/L 21  37 38  ALT 0 - 44 IU/L _0 Alk Phosphatase 44 - 121 IU/L 25(L) 30(L) 28(L)  Total Bilirubin 0.0 - 1.2 mg/dL 0.4 0.7 0.7  Bilirubin, Direct 0.00 - 0.40 mg/dL - 0.20 -    Lab Results  Component Value Date/Time   TSH 3.620 07/03/2021 08:20 AM   TSH 2.830 12/26/2020 09:16 AM    CBC Latest Ref Rng & Units 07/03/2021 12/26/2020 07/23/2020  WBC 3.4 - 10.8 x10E3/uL 7.4 6.5 8.2   Hemoglobin 13.0 - 17.7 g/dL 13.7 14.6 14.3  Hematocrit 37.5 - 51.0 % 42.2 44.0 45.0  Platelets 150 - 450 x10E3/uL 162 150 164    No results found for: VD25OH  Clinical ASCVD: No  The 10-year ASCVD risk score (Arnett DK, et al., 2019) is: 20.2%   Values used to calculate the score:     Age: 70 years     Sex: Male     Is Non-Hispanic African American: No     Diabetic: No     Tobacco smoker: No     Systolic Blood Pressure: 762 mmHg     Is BP treated: Yes     HDL Cholesterol: 43 mg/dL     Total Cholesterol: 232 mg/dL    Social History   Tobacco Use  Smoking Status Former   Packs/day: 2.00   Years: 27.00   Pack years: 54.00   Types: Cigarettes   Start date: 02/01/1971   Quit date: 01/31/1998   Years since quitting: 23.6  Smokeless Tobacco Current   Types: Chew   BP Readings from Last 3 Encounters:  07/16/21 124/78  06/19/21 (!) 142/80  04/03/21 (!) 150/81   Pulse Readings from Last 3 Encounters:  07/16/21 61  06/19/21 (!) 56  04/03/21 (!) 59   Wt Readings from Last 3 Encounters:  08/05/21 216 lb (98 kg)  07/16/21 216 lb (98 kg)  06/19/21 214 lb (97.1 kg)    Assessment: Review of patient past medical history, allergies, medications, health status, including review of consultants reports, laboratory and other test data, was performed as part of comprehensive evaluation and provision of chronic care management services.   SDOH:  (Social Determinants of Health) assessments and interventions performed:    CCM Care Plan  Allergies  Allergen Reactions   Ace Inhibitors Shortness Of Breath and Cough   Enalapril Cough    Pt has been switched to Valsartan and is tolerating the different class.   Clotrimazole-Betamethasone Rash   Prednisone Rash    Medications Reviewed Today     Reviewed by Beryle Lathe, [facility] (Pharmacist) on 09/03/21 at [place] List Status: <None>   Medication Order Taking? Sig Documenting Provider Last Dose Status Informant  albuterol  (PROVENTIL) (2.5 MG/3ML) 0.083% nebulizer solution [id number] Yes Take 3 mLs (2.5 mg total) by nebulization every 6 (six) hours as needed for wheezing or shortness of breath. Mikey Kirschner, MD Taking Active Self           Med Note Beryle Lathe   Tue Sep 03, 2021  9:11 AM) Only uses once every few months  albuterol (VENTOLIN HFA) 108 (90 Base) MCG/ACT inhaler [id number] Yes Inhale 2 puffs into the lungs every 4 (four) hours as needed for wheezing. Mikey Kirschner, MD Taking Active Self           Med Note ([place] Sep 03, 2021  9:11 AM) Uses a couple times a week  Alirocumab (PRALUENT)  150 MG/ML SOAJ [id number] Yes Inject 1 Dose into the skin every 14 (fourteen) days. Pixie Casino, MD Taking Active   ALPRAZolam Duanne Moron) 0.5 MG tablet [id number] Yes Take 0.5 tablets (0.25 mg total) by mouth 2 (two) times daily as needed for anxiety. Lindell Spar, MD Taking Active   aspirin EC 81 MG tablet [id number] Yes Take 1 tablet (81 mg total) by mouth daily.  Patient taking differently: Take 81 mg by mouth 2 (two) times a week.   Evans Lance, MD Taking Active            Med Note Beryle Lathe   Tue Sep 03, 2021  9:15 AM) Patient reports that taking daily causes rectal bleeding  budesonide-formoterol (SYMBICORT) 160-4.5 MCG/ACT inhaler [id number] Yes TAKE 2 PUFFS FIRST THING IN MORNING AND THEN ANOTHER 2 PUFFS ABOUT 12 HOURS LATER.  Patient taking differently: Inhale 2 puffs into the lungs daily. TAKE 2 PUFFS FIRST THING IN MORNING AND THEN ANOTHER 2 PUFFS ABOUT 12 HOURS LATER.   Mikey Kirschner, MD Taking Active   ezetimibe-simvastatin (VYTORIN) 10-40 MG tablet [id number] Yes TAKE 1 TABLET BY MOUTH AT BEDTIME. TAKE 1 TABLET BY MOUTH EVERYDAY AT BEDTIME Lindell Spar, MD Taking Active   fenofibrate 160 MG tablet [id number] Yes TAKE 1 TABLET BY MOUTH EVERY DAY Lindell Spar, MD Taking Active   fish oil-omega-3 fatty acids 1000 MG capsule [id number] Yes Take 1 g  by mouth in the morning and at bedtime. [provider] Taking Active Self           Med Note [place]   Tue Jan 15, 2021 10:34 AM)    losartan-hydrochlorothiazide [facility]) 100-25 MG tablet [id number] Yes TAKE 1 TABLET BY MOUTH EVERY DAY Lindell Spar, MD Taking Active   methocarbamol (ROBAXIN) 500 MG tablet [id number] Yes TAKE 1 TABLET BY MOUTH EVERY 8 HOURS AS NEEDED FOR MUSCLE SPASMS. Magnant, Gerrianne Scale, PA-C Taking Active   metoprolol tartrate (LOPRESSOR) 50 MG tablet [id number] Yes TAKE 1 TABLET BY MOUTH TWICE A DAY Lindell Spar, MD Taking Active   mupirocin ointment (BACTROBAN) 2 % [id number] Yes Apply 1 application topically 2 (two) times daily.  Patient taking differently: Apply 1 application topically 2 (two) times daily as needed (wound care).   Lindell Spar, MD Taking Active   nitroGLYCERIN (NITROSTAT) 0.4 MG SL tablet [id number] No Place 1 tablet (0.4 mg total) under the tongue every 5 (five) minutes as needed.  Patient not taking: Reported on 09/03/2021   Satira Sark, MD Not Taking Active Self  nystatin (MYCOSTATIN/NYSTOP) powder [id number] Yes Apply 1 application topically 3 (three) times daily. Lindell Spar, MD Taking Active   tamsulosin [facility]) 0.4 MG CAPS capsule [id number] No TAKE 1 CAPSULE BY MOUTH EVERY DAY  Patient not taking: Reported on 09/03/2021   Lindell Spar, MD Not Taking Active   zolpidem (AMBIEN) 10 MG tablet [id number] Yes TAKE 1 TABLET BY MOUTH AT BEDTIME AS NEEDED FOR SLEEP. 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   Anxiety 07/23/2020   Insomnia 07/23/2020   Sensorineural hearing loss (SNHL) of both ears 12/02/2018  PTSD (post-traumatic stress  disorder) 11/10/2017   Tinnitus aurium, 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   Atrial flutter ([place]) 11/21/2016   COPD GOLD III with reversibility  10/01/2015   Hematochezia    Diverticulosis of colon without hemorrhage    Hemorrhoid    History of adenomatous polyp of colon 08/14/2015   Rectal bleeding 08/14/2015   Upper airway cough syndrome 08/13/2015   Dyspnea 08/13/2015   Chronic left hip pain 09/17/2014   Typical atrial flutter ([place]) 07/17/2014   Hyperlipidemia LDL goal <100 04/08/2010   Essential hypertension, benign 04/08/2010   Coronary atherosclerosis of native coronary artery 04/08/2010   COPD (chronic obstructive pulmonary disease) ([place]) 04/08/2010    Immunization History  Administered Date(s) Administered   Influenza, High Dose Seasonal PF 08/05/2019   Influenza,inj,Quad PF,6+ Mos 08/21/2015, 07/09/2017, 08/19/2018, 08/10/2021   Influenza-Unspecified 09/24/2012, 09/24/2013, 08/07/2016, 07/26/2020   Moderna Sars-Covid-2 Vaccination 01/19/2020, 02/17/2020   Pneumococcal Conjugate-13 08/21/2015   Pneumococcal Polysaccharide-23 06/24/2009    Conditions to be addressed/monitored: HLD and COPD  Care Plan : Medication Management  Updates made by Beryle Lathe, [place] since 09/03/2021 12:00 AM  Completed 09/03/2021   Problem: COPD and HLD Resolved 09/03/2021  Priority: High  Onset Date: 09/03/2021     Goal: Disease Progression Prevention Completed 09/03/2021  Start Date: 09/03/2021  Expected End Date: 10/03/2021  This Visit's Progress: On track  Priority: High  Note:   Current Barriers:  Unable to independently afford treatment regimen Unable to achieve control of hyperlipidemia  Pharmacist Clinical Goal(s):  Patient will verbalize ability to afford treatment regimen achieve control of hyperlipidemia as evidenced by improved LDL through collaboration with PharmD and provider.   Interventions: 1:1 collaboration with Lindell Spar, MD regarding development and update  of comprehensive plan of care as evidenced by provider attestation and co-signature Inter-disciplinary care team collaboration (see longitudinal plan of care) Comprehensive medication review performed; medication list updated in electronic medical record  Hyperlipidemia (Followed by lipid clinic Dr. Debara Pickett) - New goal.: Uncontrolled. LDL above goal of <70 due to very high risk given 10-year risk >20% per 2020 AACE/ACE guidelines. Triglycerides at goal of <150 per 2020 AACE/ACE guidelines. Current medications: fenofibrate 160 mg by mouth once daily, alirocumab (Praluent) 150 mg subcutaneously every 2 weeks, simvastatin-ezetimibe 40-10 mg by mouth daily, and fish oil 1 capsule by mouth twice daily  Intolerances:  patient reports some leg cramps with simvastatin but is "tolerating" it because he knows it is important that he is on statin therapy Taking medications as directed: yes Recommend regular aerobic exercise Reviewed risks of hyperlipidemia, principles of treatment and consequences of untreated hyperlipidemia Discussed need for medication compliance Re-check lipid panel in 4-12 weeks Continue medication management per lipid clinic Completed financial workup per patient request since his Praluent is quite expensive. Unfortunately, at this time, he is not eligible for Medicare low income subsidy or manufacturer patient assistance programs. Discussed exploring Medicare plans during open enrollment to determine the best coverage for his specific needs. Patient states that he is able to afford the medication for now.   Chronic Obstructive Pulmonary Disease - New goal.: Controlled per patient. Rarely needs to use albuterol Current treatment: albuterol metered dose (ProAir, Ventolin, Proventil) 1 puff by mouth as needed for shortness of breath and budesonide/formoterol (Symbicort HFA) 160-4.5 mcg 2 puffs by mouth twice daily Patient reports only using Symbicort 2 puffs daily since he has frequent  upper respiratory infections which he thinks is associated with the inhaled corticosteroid.  Most recent Pulmonary  Function Testing: unknown 0 exacerbations requiring treatment in the last 6 months  Current oxygen requirements: 0 L Discussed need for medication compliance Continue current inhaler management for now; however, if shortness of breath becomes more prominent then patient may benefit from switching to a LAMA+LABA instead of LABA+ICS. Could also consider triple drug inhaler combination such as Gaffer.  Completed financial workup per patient request since his Symbicort is quite expensive. Unfortunately, at this time, he is not eligible for Medicare low income subsidy or manufacturer patient assistance programs. Discussed exploring Medicare plans during open enrollment to determine the best coverage for his specific needs. Patient states that he is able to afford the medication for now.   Patient Goals/Self-Care Activities Patient will:  Take medications as prescribed  Follow Up Plan: No further follow up required per patient request.      Medication Assistance: None required.  Patient affirms current coverage meets needs.  Patient's preferred pharmacy [facility]:  [id number] [place] [address]: [phone]: [phone] [facility] [address] [address]Phone: [phone]: [phone] [facility] [address]Phone: [phone]: [phone] Follow Up:  Patient requests no follow-up at this time.  CKennon Holter PharmD Clinical Pharmacist [facility] [phone]

## 2021-09-03 NOTE — Patient Instructions (Signed)
[name],  It was great to talk to you today!  Please call me with any questions or concerns.   It can be hard choosing a Medicare plan. A group that I always recommend for my patients is called [employer]). With this program, they offer free counseling to Medicare beneficiaries and caregivers about Medicare, Medicare supplements, Medicare Advantage, Medicare Part D, and long-term care insurance. [place] counselors are not [employer], and they do not sell or endorse any product, plan, or company. The counselors are volunteers and they always offer unbiased information regarding Medicare health care products.  [facility] has counselors in every county across the state who are trained to be the [place] people for seniors and Medicare beneficiaries in their local communities. Local counseling is done by appointment in each county.   The [facility] local counseling team's information can be found below. I recommend that you give them a call and set up an appointment (Ask for [place]).    [facility]     [address] [place]  [address] [phone]   For more information: [email or web address] or just google "[place] [email or web address]"   Visit Information   PATIENT GOALS:   Goals Addressed             This Visit's Progress    Medication Management       Patient Goals/Self-Care Activities Patient will:  Take medications as prescribed        Consent to CCM Services: Mr. Golson was given information about Chronic Care Management services including:  CCM service includes personalized support from designated clinical staff supervised by his physician, including individualized plan of care and coordination with other care providers 24/7  contact phone numbers for assistance for urgent and routine care needs. Service will only be billed when office clinical staff spend 20 minutes or more in a month to coordinate care. Only one practitioner may furnish and bill the service in a calendar month. The patient may stop CCM services at any time (effective at the end of the month) by phone call to the office staff. The patient will be responsible for cost sharing (co-pay) of up to 20% of the service fee (after annual deductible is met).  Patient agreed to services and verbal consent obtained.   The patient verbalized understanding of instructions, educational materials, and care plan provided today and declined offer to receive copy of patient instructions, educational materials, and care plan.   No further follow up required  Kennon Holter, PharmD Clinical Pharmacist [facility] [phone]  CLINICAL CARE PLAN: Patient Care Plan: Medication Management     Problem Identified: COPD and HLD   Priority: High  Onset Date: 09/03/2021     Goal: Disease Progression Prevention   Start Date: 09/03/2021  Expected End Date: 10/03/2021  This Visit's Progress: On track  Priority: High  Note:   Current Barriers:  Unable to independently afford treatment regimen Unable to achieve control of hyperlipidemia  Pharmacist Clinical Goal(s):  Patient will verbalize ability to afford treatment regimen achieve control of hyperlipidemia as evidenced by improved LDL through collaboration with PharmD and provider.   Interventions: 1:1 collaboration with Lindell Spar, MD regarding development and update of comprehensive plan of care as evidenced by provider attestation and co-signature Inter-disciplinary care team collaboration (see longitudinal plan of care) Comprehensive medication review performed; medication list updated in electronic medical record  Hyperlipidemia (Followed by lipid clinic  Dr. Debara Pickett) - New  goal.: Uncontrolled. LDL above goal of <70 due to very high risk given 10-year risk >20% per 2020 AACE/ACE guidelines. Triglycerides at goal of <150 per 2020 AACE/ACE guidelines. Current medications: fenofibrate 160 mg by mouth once daily, alirocumab (Praluent) 150 mg subcutaneously every 2 weeks, simvastatin-ezetimibe 40-10 mg by mouth daily, and fish oil 1 capsule by mouth twice daily  Intolerances:  patient reports some leg cramps with simvastatin but is "tolerating" it because he knows it is important that he is on statin therapy Taking medications as directed: yes Recommend regular aerobic exercise Reviewed risks of hyperlipidemia, principles of treatment and consequences of untreated hyperlipidemia Discussed need for medication compliance Re-check lipid panel in 4-12 weeks Continue medication management per lipid clinic Completed financial workup per patient request since his Praluent is quite expensive. Unfortunately, at this time, he is not eligible for Medicare low income subsidy or manufacturer patient assistance programs. Discussed exploring Medicare plans during open enrollment to determine the best coverage for his specific needs. Patient states that he is able to afford the medication for now.   Chronic Obstructive Pulmonary Disease - New goal.: Controlled per patient. Rarely needs to use albuterol Current treatment: albuterol metered dose (ProAir, Ventolin, Proventil) 1 puff by mouth as needed for shortness of breath and budesonide/formoterol (Symbicort HFA) 160-4.5 mcg 2 puffs by mouth twice daily Patient reports only using Symbicort 2 puffs daily since he has frequent upper respiratory infections which he thinks is associated with the inhaled corticosteroid.  Most recent Pulmonary Function Testing: unknown 0 exacerbations requiring treatment in the last 6 months  Current oxygen requirements: 0 L Discussed need for medication compliance Continue current inhaler management for now;  however, if shortness of breath becomes more prominent then patient may benefit from switching to a LAMA+LABA instead of LABA+ICS. Could also consider triple drug inhaler combination such as Gaffer.  Completed financial workup per patient request since his Symbicort is quite expensive. Unfortunately, at this time, he is not eligible for Medicare low income subsidy or manufacturer patient assistance programs. Discussed exploring Medicare plans during open enrollment to determine the best coverage for his specific needs. Patient states that he is able to afford the medication for now.   Patient Goals/Self-Care Activities Patient will:  Take medications as prescribed  Follow Up Plan: No further follow up required per patient request.

## 2021-09-13 ENCOUNTER — Other Ambulatory Visit: Payer: Self-pay | Admitting: Internal Medicine

## 2021-09-13 DIAGNOSIS — I10 Essential (primary) hypertension: Secondary | ICD-10-CM

## 2021-09-13 DIAGNOSIS — F419 Anxiety disorder, unspecified: Secondary | ICD-10-CM

## 2021-09-17 ENCOUNTER — Telehealth: Payer: Self-pay | Admitting: Internal Medicine

## 2021-09-17 DIAGNOSIS — E7801 Familial hypercholesterolemia: Secondary | ICD-10-CM | POA: Diagnosis not present

## 2021-09-17 NOTE — Telephone Encounter (Signed)
Patient also wants liver function test done. If an order can be sent to the lab while they have his blood there.  Order can be faxed to [facility] in [place] on [address]. Phone # [phone]

## 2021-09-17 NOTE — Telephone Encounter (Signed)
Charlene from [employer] [employer] at the [facility] location calling requesting lab orders be released. She says the patient is there waiting now.

## 2021-09-17 NOTE — Telephone Encounter (Signed)
Pt came in office and picked lab orders up.

## 2021-09-17 NOTE — Telephone Encounter (Signed)
Left message for patient that MD has ordered an NMR lipoprofile and LPa to be done before his next visit Appointment 09/24/21 with [facility] MD in [place]

## 2021-09-17 NOTE — Telephone Encounter (Signed)
Spoke with patient of Dr. Debara Pickett who said he went to [facility] this morning for lab work but they did not have his orders - orders placed for [facility]  He is upset that he wasted time this morning due to lab mix up He went to [facility] office and got a printed copy of the labs ordered by Dr. Debara Pickett  He is upset that LFTs were not ordered Advised will see if I can call to get this added on

## 2021-09-17 NOTE — Telephone Encounter (Signed)
Spoke with [facility] in [place] to order LFT as lab add on They will notify office via fax if it cannot be added on

## 2021-09-17 NOTE — Telephone Encounter (Signed)
Patient called and wanted Dr. Debara Pickett to send in a lipid panel order so that the patient can get it done along with the other lab work. Says that it has to do with the stoppage of taking Alirocumab (PRALUENT) 150 MG/ML SOAJ

## 2021-09-17 NOTE — Telephone Encounter (Signed)
Spoke with patient and relayed info, as noted below. He was appreciative of follow up

## 2021-09-23 DIAGNOSIS — J449 Chronic obstructive pulmonary disease, unspecified: Secondary | ICD-10-CM

## 2021-09-23 DIAGNOSIS — E785 Hyperlipidemia, unspecified: Secondary | ICD-10-CM

## 2021-09-24 ENCOUNTER — Encounter: Payer: Self-pay | Admitting: Internal Medicine

## 2021-09-24 ENCOUNTER — Other Ambulatory Visit: Payer: Self-pay

## 2021-09-24 ENCOUNTER — Ambulatory Visit (INDEPENDENT_AMBULATORY_CARE_PROVIDER_SITE_OTHER): Payer: Medicare Other | Admitting: Internal Medicine

## 2021-09-24 VITALS — BP 163/89 | HR 58 | Ht 73.0 in | Wt 210.0 lb

## 2021-09-24 DIAGNOSIS — I25119 Atherosclerotic heart disease of native coronary artery with unspecified angina pectoris: Secondary | ICD-10-CM | POA: Diagnosis not present

## 2021-09-24 DIAGNOSIS — I2510 Atherosclerotic heart disease of native coronary artery without angina pectoris: Secondary | ICD-10-CM

## 2021-09-24 DIAGNOSIS — Z951 Presence of aortocoronary bypass graft: Secondary | ICD-10-CM | POA: Diagnosis not present

## 2021-09-24 DIAGNOSIS — E7801 Familial hypercholesterolemia: Secondary | ICD-10-CM | POA: Diagnosis not present

## 2021-09-24 NOTE — Progress Notes (Signed)
Virtual Visit via Telephone Note   This visit type was conducted due to national recommendations for restrictions regarding the COVID-19 Pandemic (e.g. social distancing) in an effort to limit this patient's exposure and mitigate transmission in our community.  Due to his co-morbid illnesses, this patient is at least at moderate risk for complications without adequate follow up.  This format is felt to be most appropriate for this patient at this time.  The patient did not have access to video technology/had technical difficulties with video requiring transitioning to audio format only (telephone).  All issues noted in this document were discussed and addressed.  No physical exam could be performed with this format.  Please refer to the patient's chart for his  consent to telehealth for [facility].   Date:  09/24/2021   ID:  Dennis [name], DOB June 20, 1951, MRN [id number] The patient was identified using 2 identifiers.  Evaluation Performed:  Follow-Up Visit  Patient Location:  [address] [place] [address]  Provider location:   [address] [place], [place] [address]  PCP:  Lindell Spar, MD  Cardiologist:  Rozann Lesches, MD Electrophysiologist:  None   Chief Complaint:  Follow-up dyslipidemia  History of Present Illness:    Dennis [name] is a 70 y.o. male who presents via audio/video conferencing for a telehealth visit today.  this is a pleasant 70 year old male kindly referred by Dr. Domenic Polite for evaluation and management of dyslipidemia.  He has a history of coronary artery disease and prior CABG as well as hypertension, COPD and atrial flutter in the past.  He had recent lipids in February 2022 which showed total cholesterol 280, HDL 42, LDL 193 and triglycerides 234.  This is essentially his cholesterol level for many years saying that prior to this in the past he is noted to have LDL cholesterol in the 2-300 range but had done very well on Vytorin  10/40 mg daily which significantly reduced his lipids.  He is also on fenofibrate 160 mg daily.  He has high cholesterol is certainly suggestive of familial hyperlipidemia.  He notes that he has 3 sons all with high cholesterol.  He has not previously been tested for FH.  His bypass was 17 years ago when he was 32.  This is premature onset coronary disease in the male again suggestive of familial hyperlipidemia.  09/24/2021  Dennis [name] is seen today via telephone visit.  Unfortunately he felt ill today and his wife that as well and they do not want to come to the office.  He reports he has been compliant with medications.  He did have lab work drawn at [facility].  We were able to obtain the lab work.  This demonstrated a low LP(a) less than 10 which is good.  He did have a lipid NMR which showed total cholesterol now at 98, HDL 46, triglycerides 160 and LDL 28.  This represents significant reduction in his lipids on Praluent.  His LDL-P is 721, small LDL P at 506, overall a very favorable lipid profile at this point.  The patient does not have symptoms concerning for COVID-19 infection (fever, chills, cough, or new SHORTNESS OF BREATH).    Prior CV studies:   The following studies were reviewed today:  Lab work  PMHx:  Past Medical History:  Diagnosis Date   Atrial flutter ([place])    Diagnosed by ECG August 2015   Colitis    COPD (chronic obstructive pulmonary disease) (  [place])    Coronary atherosclerosis of native coronary artery    Multivessel status post CABG   Essential hypertension    Hyperlipidemia    Insomnia     Past Surgical History:  Procedure Laterality Date   COLONOSCOPY  01/29/2005   Dr. Gala Romney; rectal polyp s/p polypectomy, otherwise normal.  Pathology with tubulovillous adenoma.   COLONOSCOPY  02/06/2008   Dr. Gala Romney; minimal internal hemorrhoids, diminutive polyp in the splenic flexure s/p cold biopsy removal, otherwise normal.  Pathology with benign polypoid colonic  mucosa.   COLONOSCOPY WITH PROPOFOL N/A 08/30/2015   Procedure: COLONOSCOPY WITH PROPOFOL at cecum at 0814; withdrawal time=8 minutes;  Surgeon: Daneil Dolin, MD; internal hemorrhoids-likely source of hematochezia, otherwise normal exam.  Repeat in 5 years.   COLONOSCOPY WITH PROPOFOL N/A 01/28/2021   Procedure: COLONOSCOPY WITH PROPOFOL;  Surgeon: Daneil Dolin, MD;  [facility];  Service: Endoscopy;  Laterality: N/A;  am appt   CORONARY ARTERY BYPASS GRAFT  2005   Dr. Lucianne Lei Trigt: LIMA to LAD, right radial to circumflex, SVG to RCA   CORONARY ARTERY BYPASS GRAFT  10/16/2004   ELECTROPHYSIOLOGIC STUDY N/A 11/21/2016   Procedure: A-Flutter Ablation;  Surgeon: Evans Lance, MD;  Location: [facility] CV LAB;  Service: Cardiovascular;  Laterality: N/A;   TEE WITHOUT CARDIOVERSION N/A 11/21/2016   Procedure: TRANSESOPHAGEAL ECHOCARDIOGRAM (TEE);  Surgeon: Sanda Klein, MD;  Location: [facility] ENDOSCOPY;  Service: Cardiovascular;  Laterality: N/A;    FAMHx:  Family History  Problem Relation Age of Onset   Hypertension Mother    Alcohol abuse Mother    Alcohol abuse Father    Colon cancer Neg Hx     SOCHx:   reports that he quit smoking about 23 years ago. His smoking use included cigarettes. He started smoking about 50 years ago. He has a 54.00 pack-year smoking history. His smokeless tobacco use includes chew. He reports current alcohol use. He reports that he does not use drugs.  ALLERGIES:  Allergies  Allergen Reactions   Ace Inhibitors Shortness Of Breath and Cough   Enalapril Cough    Pt has been switched to Valsartan and is tolerating the different class.   Clotrimazole-Betamethasone Rash   Prednisone Rash    MEDS:  Current Meds  Medication Sig   albuterol (PROVENTIL) (2.5 MG/3ML) 0.083% nebulizer solution Take 3 mLs (2.5 mg total) by nebulization every 6 (six) hours as needed for wheezing or shortness of breath.   albuterol (VENTOLIN HFA) 108 (90 Base) MCG/ACT  inhaler Inhale 2 puffs into the lungs every 4 (four) hours as needed for wheezing.   Alirocumab (PRALUENT) 150 MG/ML SOAJ Inject 1 Dose into the skin every 14 (fourteen) days.   ALPRAZolam (XANAX) 0.5 MG tablet TAKE 0.5 TABLETS BY MOUTH 2 TIMES DAILY AS NEEDED FOR ANXIETY.   aspirin EC 81 MG tablet Take 1 tablet (81 mg total) by mouth daily. (Patient taking differently: Take 81 mg by mouth 2 (two) times a week.)   budesonide-formoterol (SYMBICORT) 160-4.5 MCG/ACT inhaler TAKE 2 PUFFS FIRST THING IN MORNING AND THEN ANOTHER 2 PUFFS ABOUT 12 HOURS LATER. (Patient taking differently: Inhale 2 puffs into the lungs daily. TAKE 2 PUFFS FIRST THING IN MORNING AND THEN ANOTHER 2 PUFFS ABOUT 12 HOURS LATER.)   ezetimibe-simvastatin (VYTORIN) 10-40 MG tablet TAKE 1 TABLET BY MOUTH AT BEDTIME. TAKE 1 TABLET BY MOUTH EVERYDAY AT BEDTIME   fenofibrate 160 MG tablet TAKE 1 TABLET BY MOUTH EVERY DAY  fish oil-omega-3 fatty acids 1000 MG capsule Take 1 g by mouth in the morning and at bedtime.   losartan-hydrochlorothiazide (HYZAAR) 100-25 MG tablet TAKE 1 TABLET BY MOUTH EVERY DAY   methocarbamol (ROBAXIN) 500 MG tablet TAKE 1 TABLET BY MOUTH EVERY 8 HOURS AS NEEDED FOR MUSCLE SPASMS.   metoprolol tartrate (LOPRESSOR) 50 MG tablet TAKE 1 TABLET BY MOUTH TWICE A DAY   mupirocin ointment (BACTROBAN) 2 % Apply 1 application topically 2 (two) times daily. (Patient taking differently: Apply 1 application topically 2 (two) times daily as needed (wound care).)   nitroGLYCERIN (NITROSTAT) 0.4 MG SL tablet Place 1 tablet (0.4 mg total) under the tongue every 5 (five) minutes as needed.   nystatin (MYCOSTATIN/NYSTOP) powder Apply 1 application topically 3 (three) times daily.   zolpidem (AMBIEN) 10 MG tablet TAKE 1 TABLET BY MOUTH AT BEDTIME AS NEEDED FOR SLEEP.     ROS: Pertinent items noted in HPI and remainder of comprehensive ROS otherwise negative.  Labs/Other Tests and Data Reviewed:    Recent Labs: 07/03/2021:  ALT 18; BUN 22; Creatinine, Ser 1.30; Hemoglobin 13.7; Platelets 162; Potassium 4.7; Sodium 138; TSH 3.620   Recent Lipid Panel Lab Results  Component Value Date/Time   CHOL 232 (H) 07/03/2021 08:20 AM   TRIG 146 07/03/2021 08:20 AM   HDL 43 07/03/2021 08:20 AM   CHOLHDL 5.4 (H) 07/03/2021 08:20 AM   CHOLHDL 9.4 (H) 01/11/2018 08:33 AM   LDLCALC 162 (H) 07/03/2021 08:20 AM   LDLCALC  01/11/2018 08:33 AM     Comment:     . LDL cholesterol not calculated. Triglyceride levels greater than 400 mg/dL invalidate calculated LDL results. . Reference range: <100 . Desirable range <100 mg/dL for primary prevention;   <70 mg/dL for patients with CHD or diabetic patients  with > or = 2 CHD risk factors. Marland Kitchen LDL-C is now calculated using the Martin-Hopkins  calculation, which is a validated novel method providing  better accuracy than the Friedewald equation in the  estimation of LDL-C.  Cresenciano Genre et al. Annamaria Helling. 5638;756(43): 2061-2068  (http://education.[employer].com/faq/FAQ164)     Wt Readings from Last 3 Encounters:  09/24/21 210 lb (95.3 kg)  08/05/21 216 lb (98 kg)  07/16/21 216 lb (98 kg)     Exam:    Vital Signs:  BP (!) 163/89   Pulse (!) 58   Ht 6\' 1"  (1.854 m)   Wt 210 lb (95.3 kg)   SpO2 97%   BMI 27.71 kg/m    Deferred due to telephone visit  ASSESSMENT & PLAN:    Familial hyperlipidemia Premature onset coronary disease status post CABG at age 28 Family history of multiple children with high cholesterol  Dennis [name] has had marked reduction in his dyslipidemia.  His LP(a) was negative.  Liver enzymes are normal.  Total cholesterol is come down to 98 with LDL 28 and significant improvement in particle numbers.  He is at target.  I would prefer to keep him on his current combination of medications but he thinks that he is having side effects related to the statin.  He is on Vytorin 10/40 mg daily.  We may be able to decrease that to 10/20 mg and still have him  adequately treated.  We will plan repeat lipids in about 3 months and readdress the medications at that time.  He is requesting a possible 51-month at a time supply of his Praluent  COVID-19 Education: The signs and symptoms of COVID-19 were discussed  with the patient and how to seek care for testing (follow up with PCP or arrange E-visit).  The importance of social distancing was discussed today.  Patient Risk:   After full review of this patients clinical status, I feel that they are at least moderate risk at this time.  Time:   Today, I have spent 15 minutes with the patient with telehealth technology discussing dyslipidemia.     Medication Adjustments/Labs and Tests Ordered: Current medicines are reviewed at length with the patient today.  Concerns regarding medicines are outlined above.   Tests Ordered: No orders of the defined types were placed in this encounter.   Medication Changes: No orders of the defined types were placed in this encounter.   Disposition:  in 3 month(s)  Pixie Casino, MD, [facility], [place] Director of the Advanced Lipid Disorders &  Cardiovascular Risk Reduction Clinic Diplomate of the American Board of Clinical Lipidology Attending Cardiologist  Direct Dial: [phone]  Fax: [phone]  Website:  [email or web address]  Pixie Casino, MD  09/24/2021 3:48 PM

## 2021-09-24 NOTE — Patient Instructions (Signed)
Medication Instructions:  Your physician recommends that you continue on your current medications as directed. Please refer to the Current Medication list given to you today.   Labwork: None today  Testing/Procedures: None today  Follow-Up: 1 year with Dr.McDowell  Any Other Special Instructions Will Be Listed Below (If Applicable).  If you need a refill on your cardiac medications before your next appointment, please call your pharmacy.  

## 2021-09-25 MED ORDER — PRALUENT 150 MG/ML ~~LOC~~ SOAJ: 1.0000 | SUBCUTANEOUS | 3 refills | Status: DC

## 2021-09-25 NOTE — Addendum Note (Signed)
Addended by: Fidel Levy on: 09/25/2021 09:36 AM   Modules accepted: Orders

## 2021-09-25 NOTE — Addendum Note (Signed)
Addended by: Fidel Levy on: 09/25/2021 09:37 AM   Modules accepted: Orders

## 2021-09-26 ENCOUNTER — Telehealth: Payer: Self-pay | Admitting: Internal Medicine

## 2021-09-26 NOTE — Telephone Encounter (Signed)
*  STAT* If patient is at the pharmacy, call can be transferred to refill team.   1. Which medications need to be refilled? (please list name of each medication and dose if known) new prescription for Repatha  2. Which pharmacy/location (including street and city if local pharmacy) is medication to be sent to? [place], [place]  3. Do they need a 30 day or 90 day supply? 3 months supply

## 2021-09-28 ENCOUNTER — Other Ambulatory Visit: Payer: Self-pay | Admitting: Internal Medicine

## 2021-09-28 DIAGNOSIS — I10 Essential (primary) hypertension: Secondary | ICD-10-CM

## 2021-10-21 ENCOUNTER — Telehealth: Payer: Self-pay | Admitting: Internal Medicine

## 2021-10-21 NOTE — Telephone Encounter (Signed)
Returned call to patient-patient reports taking his 8th Praulent shot last Monday (11/21).  Reports that nigth he started itching on his LE and arms.  The next day he noticed a rash all over, "round red spots".  States he has been taking benedryl and using cortisone cream, rash is improving.   Patient is unsure if this is related to the shot.   Advised would route to MD to review.

## 2021-10-21 NOTE — Telephone Encounter (Signed)
Pt c/o medication issue:  1. Name of Medication: Alirocumab (PRALUENT) 150 MG/ML SOAJ  2. How are you currently taking this medication (dosage and times per day)?  AS  DIRECTED EVERY 2 WEEKS  3. Are you having a reaction (difficulty breathing--STAT)? NO  4. What is your medication issue? PT HAVE HAD A REACTION TO THIS MEDICINE. ALL OVER BODY RASH SINCE LAST Monday. PT WOULD LIKE TO SEND AN PICUTRE SO THE DR CAN SEE IT BUT HE DOES NOT HAVE MYCHART.

## 2021-10-22 NOTE — Telephone Encounter (Signed)
Very unlikely to be due to the Praluent after not having a reaction given numerous prior injections.  Dr Lemmie Evens

## 2021-10-23 NOTE — Telephone Encounter (Signed)
Different lot of medication should not affect tolerability. Agree with Dr Lysbeth Penner input. He's treating the rash appropriately. Would advise him to monitor for symptom improvement of rash and try to give next Praluent dose as scheduled in 2 weeks. If rash has resolved by then and reappears after another injection, he should let us know.

## 2021-10-23 NOTE — Telephone Encounter (Signed)
Pt is returning call about rash

## 2021-10-23 NOTE — Telephone Encounter (Signed)
Called patient to give him information on suspected Praluent rash that started 10 days ago that Dr. Debara Pickett said was not from the medication because the patient had numerous prior injections. Patient told me he started a new batch of Praluent 10 days ago. Lot # [id number]; Exp date 02/22/2023. Sending to PharmD pool.

## 2021-10-24 NOTE — Telephone Encounter (Signed)
Would take the next injection as scheduled - can try to move up appt in January if anything available.  Dr Lemmie Evens

## 2021-10-24 NOTE — Telephone Encounter (Signed)
Called patient back, advised of message below from [facility]. Patient states that he states that the rash is like red spots, and they are all over his body- he states that it seems to be clearing, but he is due for his next injection on Monday and he would try the injection and if the spots got worse then he would let us know. However, he does want to discuss further with Dr.Hilty- he is suppose to see him in March and he feels that this is too far out.  He is requesting a sooner appointment, like January or March.  Advised with patient I would route a message to MD and nurse to advise further.   Thanks!

## 2021-10-24 NOTE — Telephone Encounter (Signed)
Called patient, LVM to call back to discuss PHARMD recommendations.  Left call back number.

## 2021-10-24 NOTE — Telephone Encounter (Signed)
Pt returning phone call... please advise  

## 2021-10-25 NOTE — Telephone Encounter (Signed)
Spoke with patient's wife. Scheduled appt for Dec 12, 2021 at [facility], per request for sooner appt

## 2021-11-12 ENCOUNTER — Encounter (INDEPENDENT_AMBULATORY_CARE_PROVIDER_SITE_OTHER): Payer: Self-pay

## 2021-11-12 ENCOUNTER — Ambulatory Visit (INDEPENDENT_AMBULATORY_CARE_PROVIDER_SITE_OTHER): Payer: Medicare Other | Admitting: Internal Medicine

## 2021-11-12 ENCOUNTER — Other Ambulatory Visit: Payer: Self-pay

## 2021-11-12 ENCOUNTER — Encounter: Payer: Self-pay | Admitting: Internal Medicine

## 2021-11-12 VITALS — BP 108/76 | HR 59 | Resp 16 | Ht 73.0 in | Wt 212.1 lb

## 2021-11-12 DIAGNOSIS — N401 Enlarged prostate with lower urinary tract symptoms: Secondary | ICD-10-CM | POA: Diagnosis not present

## 2021-11-12 DIAGNOSIS — I10 Essential (primary) hypertension: Secondary | ICD-10-CM | POA: Diagnosis not present

## 2021-11-12 DIAGNOSIS — T7840XD Allergy, unspecified, subsequent encounter: Secondary | ICD-10-CM | POA: Diagnosis not present

## 2021-11-12 DIAGNOSIS — Z125 Encounter for screening for malignant neoplasm of prostate: Secondary | ICD-10-CM | POA: Diagnosis not present

## 2021-11-12 DIAGNOSIS — R7303 Prediabetes: Secondary | ICD-10-CM

## 2021-11-12 DIAGNOSIS — J42 Unspecified chronic bronchitis: Secondary | ICD-10-CM | POA: Diagnosis not present

## 2021-11-12 DIAGNOSIS — E559 Vitamin D deficiency, unspecified: Secondary | ICD-10-CM

## 2021-11-12 DIAGNOSIS — Z23 Encounter for immunization: Secondary | ICD-10-CM

## 2021-11-12 DIAGNOSIS — R350 Frequency of micturition: Secondary | ICD-10-CM

## 2021-11-12 DIAGNOSIS — B351 Tinea unguium: Secondary | ICD-10-CM

## 2021-11-12 MED ORDER — TERBINAFINE HCL 250 MG PO TABS: 250.0000 mg | ORAL_TABLET | Freq: Every day | ORAL | 2 refills | Status: DC

## 2021-11-12 MED ORDER — DEXAMETHASONE 6 MG PO TABS: 6.0000 mg | ORAL_TABLET | Freq: Two times a day (BID) | ORAL | 0 refills | Status: DC

## 2021-11-12 MED ORDER — BUDESONIDE-FORMOTEROL FUMARATE 160-4.5 MCG/ACT IN AERO: INHALATION_SPRAY | RESPIRATORY_TRACT | 1 refills | Status: DC

## 2021-11-12 NOTE — Patient Instructions (Signed)
Please take Terbinafine for toenail infection.  Please discuss with Dr Debara Pickett about Praluent.  Please call the pharmacy when its time to refill Xanax.

## 2021-11-14 NOTE — Assessment & Plan Note (Signed)
BP Readings from Last 1 Encounters:  11/12/21 108/76   Well-controlled with Hyzaar and Metoprolol Counseled for compliance with the medications Advised DASH diet and moderate exercise/walking, at least 150 mins/week

## 2021-11-14 NOTE — Progress Notes (Signed)
Established Patient Office Visit  Subjective:  Patient ID: Dennis [name], male    DOB: August 04, 1951  Age: 70 y.o. MRN: [id number]  CC:  Chief Complaint  Patient presents with   Follow-up    4 month follow up pt was put on pralevent shots for cholesterol first two were fine third one broke out in rash all over body, also muscle weakness since taking the shots, xanax works good,  wants to look at toenails and blood vessel on left foot big toes are always cold     HPI Dennis [name] is a 70 y.o. male with past medical history of CAD s/p CABG, HTN, HLD, COPD, anxiety and insomnia who presents for f/u of his chronic medical conditions.  He has been getting Praluent for familial hypercholesterolemia.  He has been having severe myalgias since the third dose of Praluent.  He has diffuse, erythematous rash over his arms and trunk. He still takes it, but is going to discuss it with Dr. Debara Pickett.  He has not tolerated statin in the past.  COPD: He uses Symbicort and as needed albuterol, which he has not been requiring frequently.  He denies any fever, chills, chest pain or palpitations currently.  Anxiety/insomnia/PTSD: Currently well controlled with Xanax.  He gets intermittent episodes of severe anxiety spells, where he gets flushed and has mild dyspnea, but they have been less frequent recently.  He also takes Ambien for insomnia.  HTN and CAD: BP is well-controlled. Takes medications regularly. Patient denies headache, dizziness, chest pain, dyspnea or palpitations.  He received PPSV23 in the office today.   Past Medical History:  Diagnosis Date   Atrial flutter ([place])    Diagnosed by ECG August 2015   Colitis    COPD (chronic obstructive pulmonary disease) ([place])    Coronary atherosclerosis of native coronary artery    Multivessel status post CABG   Essential hypertension    Hyperlipidemia    Insomnia     Past Surgical History:  Procedure Laterality Date   COLONOSCOPY  01/29/2005    Dr. Gala Romney; rectal polyp s/p polypectomy, otherwise normal.  Pathology with tubulovillous adenoma.   COLONOSCOPY  02/06/2008   Dr. Gala Romney; minimal internal hemorrhoids, diminutive polyp in the splenic flexure s/p cold biopsy removal, otherwise normal.  Pathology with benign polypoid colonic mucosa.   COLONOSCOPY WITH PROPOFOL N/A 08/30/2015   Procedure: COLONOSCOPY WITH PROPOFOL at cecum at 0814; withdrawal time=8 minutes;  Surgeon: Daneil Dolin, MD; internal hemorrhoids-likely source of hematochezia, otherwise normal exam.  Repeat in 5 years.   COLONOSCOPY WITH PROPOFOL N/A 01/28/2021   Procedure: COLONOSCOPY WITH PROPOFOL;  Surgeon: Daneil Dolin, MD;  Location: [facility];  Service: Endoscopy;  Laterality: N/A;  am appt   CORONARY ARTERY BYPASS GRAFT  2005   Dr. Lucianne Lei Trigt: LIMA to LAD, right radial to circumflex, SVG to RCA   CORONARY ARTERY BYPASS GRAFT  10/16/2004   ELECTROPHYSIOLOGIC STUDY N/A 11/21/2016   Procedure: A-Flutter Ablation;  Surgeon: Evans Lance, MD;  Location: [place] [facility];  Service: Cardiovascular;  Laterality: N/A;   TEE WITHOUT CARDIOVERSION N/A 11/21/2016   Procedure: TRANSESOPHAGEAL ECHOCARDIOGRAM (TEE);  Surgeon: Sanda Klein, MD;  Location: [facility] ENDOSCOPY;  Service: Cardiovascular;  Laterality: N/A;    Family History  Problem Relation Age of Onset   Hypertension Mother    Alcohol abuse Mother    Alcohol abuse Father    Colon cancer Neg Hx     Social History  Socioeconomic History   Marital status: Married    Spouse name: Not on file   Number of children: 3   Years of education: Not on file   Highest education level: Not on file  Occupational History   Not on file  Tobacco Use   Smoking status: Former    Packs/day: 2.00    Years: 27.00    Pack years: 54.00    Types: Cigarettes    Start date: 02/01/1971    Quit date: 01/31/1998    Years since quitting: 23.8   Smokeless tobacco: Current    Types: Chew  Vaping Use   Vaping Use: Never used   Substance and Sexual Activity   Alcohol use: Yes    Alcohol/week: 0.0 standard drinks    Comment: Drinks beer daily, typically 2-4 a day. Also 4 oz red wine an evening.   Drug use: No   Sexual activity: Not on file  Other Topics Concern   Not on file  Social History Narrative   Lives with wife, Anderson Malta   Social Determinants of Health   Financial Resource Strain: Medium Risk   Difficulty of Paying Living Expenses: Somewhat hard  Food Insecurity: No Food Insecurity   Worried About Charity fundraiser in the Last Year: Never true   Arboriculturist in the Last Year: Never true  Transportation Needs: No Transportation Needs   Lack of Transportation (Medical): No   Lack of Transportation (Non-Medical): No  Physical Activity: Insufficiently Active   Days of Exercise per Week: 3 days   Minutes of Exercise per Session: 30 min  Stress: No Stress Concern Present   Feeling of Stress : Only a little  Social Connections: Moderately Isolated   Frequency of Communication with Friends and Family: More than three times a week   Frequency of Social Gatherings with Friends and Family: More than three times a week   Attends Religious Services: Never   Marine scientist or Organizations: No   Attends Music therapist: Never   Marital Status: Married  Human resources officer Violence: Not At Risk   Fear of Current or Ex-Partner: No   Emotionally Abused: No   Physically Abused: No   Sexually Abused: No    Outpatient Medications Prior to Visit  Medication Sig Dispense Refill   albuterol (PROVENTIL) (2.5 MG/3ML) 0.083% nebulizer solution Take 3 mLs (2.5 mg total) by nebulization every 6 (six) hours as needed for wheezing or shortness of breath. 150 mL 3   albuterol (VENTOLIN HFA) 108 (90 Base) MCG/ACT inhaler Inhale 2 puffs into the lungs every 4 (four) hours as needed for wheezing. 18 g 5   Alirocumab (PRALUENT) 150 MG/ML SOAJ Inject 1 Dose into the skin every 14 (fourteen) days. 6  mL 3   ALPRAZolam (XANAX) 0.5 MG tablet TAKE 0.5 TABLETS BY MOUTH 2 TIMES DAILY AS NEEDED FOR ANXIETY. 30 tablet 1   aspirin EC 81 MG tablet Take 1 tablet (81 mg total) by mouth daily. (Patient taking differently: Take 81 mg by mouth 2 (two) times a week.) 30 tablet 11   fenofibrate 160 MG tablet TAKE 1 TABLET BY MOUTH EVERY DAY 90 tablet 1   fish oil-omega-3 fatty acids 1000 MG capsule Take 1 g by mouth in the morning and at bedtime.     ketoconazole (NIZORAL) 2 % cream Apply 1 application topically daily.     losartan-hydrochlorothiazide (HYZAAR) 100-25 MG tablet TAKE 1 TABLET BY MOUTH EVERY DAY 90 tablet  0   methocarbamol (ROBAXIN) 500 MG tablet TAKE 1 TABLET BY MOUTH EVERY 8 HOURS AS NEEDED FOR MUSCLE SPASMS. 30 tablet 0   metoprolol tartrate (LOPRESSOR) 50 MG tablet TAKE 1 TABLET BY MOUTH TWICE A DAY 180 tablet 1   mupirocin ointment (BACTROBAN) 2 % Apply 1 application topically 2 (two) times daily. (Patient taking differently: Apply 1 application topically 2 (two) times daily as needed (wound care).) 22 g 0   nitroGLYCERIN (NITROSTAT) 0.4 MG SL tablet Place 1 tablet (0.4 mg total) under the tongue every 5 (five) minutes as needed. 25 tablet 3   nystatin (MYCOSTATIN/NYSTOP) powder Apply 1 application topically 3 (three) times daily. 15 g 0   tamsulosin (FLOMAX) 0.4 MG CAPS capsule TAKE 1 CAPSULE BY MOUTH EVERY DAY 90 capsule 0   zolpidem (AMBIEN) 10 MG tablet TAKE 1 TABLET BY MOUTH AT BEDTIME AS NEEDED FOR SLEEP. 30 tablet 3   budesonide-formoterol (SYMBICORT) 160-4.5 MCG/ACT inhaler TAKE 2 PUFFS FIRST THING IN MORNING AND THEN ANOTHER 2 PUFFS ABOUT 12 HOURS LATER. (Patient taking differently: Inhale 2 puffs into the lungs daily. TAKE 2 PUFFS FIRST THING IN MORNING AND THEN ANOTHER 2 PUFFS ABOUT 12 HOURS LATER.) 30.6 Inhaler 1   ezetimibe-simvastatin (VYTORIN) 10-40 MG tablet TAKE 1 TABLET BY MOUTH AT BEDTIME. TAKE 1 TABLET BY MOUTH EVERYDAY AT BEDTIME (Patient not taking: Reported on 11/12/2021)  90 tablet 1   No facility-administered medications prior to visit.    Allergies  Allergen Reactions   Ace Inhibitors Shortness Of Breath and Cough   Enalapril Cough    Pt has been switched to Valsartan and is tolerating the different class.   Clotrimazole-Betamethasone Rash   Prednisone Rash    ROS Review of Systems  Constitutional:  Negative for chills and fever.  HENT:  Negative for congestion and sore throat.   Eyes:  Negative for pain and discharge.  Respiratory:  Positive for cough and shortness of breath.   Cardiovascular:  Negative for chest pain and palpitations.  Gastrointestinal:  Negative for diarrhea, nausea and vomiting.  Endocrine: Negative for polydipsia and polyuria.  Genitourinary:  Negative for dysuria, flank pain and hematuria.       Nocturia  Musculoskeletal:  Negative for neck pain and neck stiffness.  Skin:  Positive for rash.  Neurological:  Negative for dizziness, weakness, numbness and headaches.  Psychiatric/Behavioral:  Positive for sleep disturbance. Negative for agitation, behavioral problems, dysphoric mood and suicidal ideas. The patient is nervous/anxious.      Objective:    Physical Exam Vitals reviewed.  Constitutional:      General: He is not in acute distress.    Appearance: He is not diaphoretic.  HENT:     Head: Normocephalic and atraumatic.     Nose: Nose normal.     Mouth/Throat:     Mouth: Mucous membranes are moist.  Eyes:     General: No scleral icterus.    Extraocular Movements: Extraocular movements intact.  Cardiovascular:     Rate and Rhythm: Normal rate and regular rhythm.     Pulses: Normal pulses.     Heart sounds: Normal heart sounds. No murmur heard. Pulmonary:     Breath sounds: Wheezing (Mild, diffuse) present. No rales.  Abdominal:     Palpations: Abdomen is soft.     Tenderness: There is no abdominal tenderness.  Musculoskeletal:     Cervical back: Neck supple. No tenderness.     Right lower leg: No  edema.  Left lower leg: No edema.  Feet:     Right foot:     Toenail Condition: Fungal disease present.    Left foot:     Toenail Condition: Fungal disease present. Skin:    General: Skin is warm.     Findings: Rash (Erythematous patches over arms and trunk) present.  Neurological:     General: No focal deficit present.     Mental Status: He is alert and oriented to person, place, and time.     Sensory: No sensory deficit.     Motor: No weakness.  Psychiatric:        Mood and Affect: Mood normal.        Behavior: Behavior normal.    BP 108/76 (BP Location: Left Arm, Patient Position: Sitting, Cuff Size: Normal)    Pulse (!) 59    Resp 16    Ht 6' 1" (1.854 m)    Wt 212 lb 1.3 oz (96.2 kg)    SpO2 96%    BMI 27.98 kg/m  Wt Readings from Last 3 Encounters:  11/12/21 212 lb 1.3 oz (96.2 kg)  09/24/21 210 lb (95.3 kg)  08/05/21 216 lb (98 kg)    Lab Results  Component Value Date   TSH 3.620 07/03/2021   Lab Results  Component Value Date   WBC 7.4 07/03/2021   HGB 13.7 07/03/2021   HCT 42.2 07/03/2021   MCV 94 07/03/2021   PLT 162 07/03/2021   Lab Results  Component Value Date   NA 138 07/03/2021   K 4.7 07/03/2021   CO2 26 07/03/2021   GLUCOSE 112 (H) 07/03/2021   BUN 22 07/03/2021   CREATININE 1.30 (H) 07/03/2021   BILITOT 0.4 07/03/2021   ALKPHOS 25 (L) 07/03/2021   AST 21 07/03/2021   ALT 18 07/03/2021   PROT 6.1 07/03/2021   ALBUMIN 4.4 07/03/2021   CALCIUM 9.4 07/03/2021   ANIONGAP 6 03/25/2020   EGFR 60 07/03/2021   GFR 60.41 08/13/2015   Lab Results  Component Value Date   CHOL 232 (H) 07/03/2021   Lab Results  Component Value Date   HDL 43 07/03/2021   Lab Results  Component Value Date   LDLCALC 162 (H) 07/03/2021   Lab Results  Component Value Date   TRIG 146 07/03/2021   Lab Results  Component Value Date   CHOLHDL 5.4 (H) 07/03/2021   Lab Results  Component Value Date   HGBA1C 6.0 (H) 07/03/2021      Assessment & Plan:    Problem List Items Addressed This Visit       Cardiovascular and Mediastinum   Essential hypertension, benign    BP Readings from Last 1 Encounters:  11/12/21 108/76  Well-controlled with Hyzaar and Metoprolol Counseled for compliance with the medications Advised DASH diet and moderate exercise/walking, at least 150 mins/week      Relevant Orders   CMP14+EGFR     Respiratory   COPD (chronic obstructive pulmonary disease) ([place])    Quit smoking in 1999 On Symbicort and Albuterol Advised to comply with Symbicort to avoid frequent exacerbations       Relevant Medications   dexamethasone (DECADRON) 6 MG tablet   budesonide-formoterol (SYMBICORT) 160-4.5 MCG/ACT inhaler   Other Relevant Orders   CMP14+EGFR   CBC with Differential/Platelet     Musculoskeletal and Integument   Onychomycosis - Primary    Started terbinafine If persistent, will refer to podiatry      Relevant Medications  ketoconazole (NIZORAL) 2 % cream   terbinafine (LAMISIL) 250 MG tablet     Other   Benign prostatic hyperplasia with urinary frequency    C/o nocturia and frequency No dysuria or hematuria, UA negative for LE and nitrites On Flomax 0.4 mg QD      Relevant Orders   PSA   Other Visit Diagnoses     Allergic reaction, subsequent encounter     Unclear if related to Praluent He is going to hold Praluent for now Decadron 6 mg BID Continue Benadryl or Zyrtec as needed    Relevant Medications   dexamethasone (DECADRON) 6 MG tablet   Other Relevant Orders   CBC with Differential/Platelet   Prostate cancer screening       Relevant Orders   PSA   Prediabetes       Relevant Orders   Hemoglobin A1c   Vitamin D deficiency       Relevant Orders   VITAMIN D 25 Hydroxy (Vit-D Deficiency, Fractures)   Need for viral immunization       Relevant Orders   Pneumococcal polysaccharide vaccine 23-valent greater than or equal to 2yo subcutaneous/IM (Completed)       Meds ordered this  encounter  Medications   terbinafine (LAMISIL) 250 MG tablet    Sig: Take 1 tablet (250 mg total) by mouth daily.    Dispense:  30 tablet    Refill:  2   dexamethasone (DECADRON) 6 MG tablet    Sig: Take 1 tablet (6 mg total) by mouth 2 (two) times daily with a meal.    Dispense:  10 tablet    Refill:  0   budesonide-formoterol (SYMBICORT) 160-4.5 MCG/ACT inhaler    Sig: TAKE 2 PUFFS FIRST THING IN MORNING AND THEN ANOTHER 2 PUFFS ABOUT 12 HOURS LATER.    Dispense:  30.6 each    Refill:  1    Follow-up: Return in about 4 months (around 03/13/2022) for HTN, COPD and HLD.    Lindell Spar, MD

## 2021-11-14 NOTE — Assessment & Plan Note (Signed)
Started terbinafine If persistent, will refer to podiatry

## 2021-11-14 NOTE — Assessment & Plan Note (Signed)
C/o nocturia and frequency No dysuria or hematuria, UA negative for LE and nitrites On Flomax 0.4 mg QD

## 2021-11-14 NOTE — Assessment & Plan Note (Signed)
Quit smoking in 1999 On Symbicort and Albuterol Advised to comply with Symbicort to avoid frequent exacerbations

## 2021-11-16 ENCOUNTER — Other Ambulatory Visit: Payer: Self-pay | Admitting: Internal Medicine

## 2021-11-16 DIAGNOSIS — G4700 Insomnia, unspecified: Secondary | ICD-10-CM

## 2021-11-21 ENCOUNTER — Telehealth: Payer: Self-pay | Admitting: Internal Medicine

## 2021-11-21 NOTE — Telephone Encounter (Signed)
Spouse call on pt behalf   Wants to discuss pt complication with med

## 2021-11-21 NOTE — Telephone Encounter (Signed)
Spoke with pt wife since starting on medication that was given at last office visit the dexomethazone and terbinafine he lost taste he also has been swelling broke out in rash is gaining weight hands arms and legs are swollen not sure what they need to do please advise

## 2021-11-22 NOTE — Telephone Encounter (Signed)
LVM for pt to call the office.

## 2021-11-22 NOTE — Telephone Encounter (Signed)
Pt wife Lyla Glassing to stop medication and keep taking benadryl said pt had stopped medication but benadryl just didn't seem to be helping per provider advised pt to go to ER or urgent care since this was not helping with verbal understanding let her know [facility] was [facility] in [place] as he was afraid they would try to give him steroids and she was afraid that's what caused this. Let her know that [facility] could see all records from our office.

## 2021-12-12 ENCOUNTER — Other Ambulatory Visit: Payer: Self-pay | Admitting: Internal Medicine

## 2021-12-12 ENCOUNTER — Ambulatory Visit: Payer: Medicare Other | Admitting: Internal Medicine

## 2021-12-23 ENCOUNTER — Telehealth: Payer: Self-pay | Admitting: Internal Medicine

## 2021-12-23 NOTE — Telephone Encounter (Signed)
Pt needs a nebulizer mouth piece

## 2021-12-24 ENCOUNTER — Other Ambulatory Visit: Payer: Self-pay | Admitting: *Deleted

## 2021-12-24 MED ORDER — NEBULIZER/TUBING/MOUTHPIECE KIT: PACK | 0 refills | Status: AC

## 2021-12-24 NOTE — Telephone Encounter (Signed)
Have printed prescription not sure where patient would like this sent to line busy when attempted to call pt if you can find out which pharmacy I will be happy to send in for pt

## 2021-12-24 NOTE — Telephone Encounter (Signed)
Pt is calling back

## 2021-12-25 NOTE — Telephone Encounter (Signed)
Sent to [facility] 

## 2021-12-30 ENCOUNTER — Telehealth: Payer: Self-pay

## 2021-12-30 ENCOUNTER — Encounter: Payer: Self-pay | Admitting: Nurse Practitioner

## 2021-12-30 ENCOUNTER — Ambulatory Visit (INDEPENDENT_AMBULATORY_CARE_PROVIDER_SITE_OTHER): Payer: Medicare Other | Admitting: Nurse Practitioner

## 2021-12-30 ENCOUNTER — Other Ambulatory Visit: Payer: Self-pay

## 2021-12-30 DIAGNOSIS — J40 Bronchitis, not specified as acute or chronic: Secondary | ICD-10-CM | POA: Insufficient documentation

## 2021-12-30 DIAGNOSIS — J209 Acute bronchitis, unspecified: Secondary | ICD-10-CM | POA: Insufficient documentation

## 2021-12-30 DIAGNOSIS — Z20822 Contact with and (suspected) exposure to covid-19: Secondary | ICD-10-CM | POA: Diagnosis not present

## 2021-12-30 MED ORDER — GUAIFENESIN ER 600 MG PO TB12: 600.0000 mg | ORAL_TABLET | Freq: Two times a day (BID) | ORAL | 0 refills | Status: DC

## 2021-12-30 MED ORDER — AZITHROMYCIN 250 MG PO TABS: ORAL_TABLET | ORAL | 0 refills | Status: AC

## 2021-12-30 NOTE — Progress Notes (Signed)
Virtual Visit via Telephone Note  I connected with Dennis [name] @ on 12/30/21 at  1146. by telephone and verified that I am speaking with the correct person using two identifiers. I spent 8 minutes talking to the pt and reviewing his chart.   Location: Patient: home Provider: office    I discussed the limitations, risks, security and privacy concerns of performing an evaluation and management service by telephone and the availability of in person appointments. I also discussed with the patient that there may be a patient responsible charge related to this service. The patient expressed understanding and agreed to proceed.   History of Present Illness: Pt c/o cough states that he gets URI twice a year. Has Dry cough, sinus pressure, had greenish colored sputum few days ago, he has used mucinex it helped a little bit. denies fever, chills, body aches, has chronic wheezing and sob. Takes Symbicort inhaler and albuterol inhaler. Has many grandkids around him and they have cough and running nose. Pt stated that he is up to date with covid vaccine, flu vaccine and pneumonia vaccine. . Pt stated that when he has symptoms like this taking antibiotics usually helps him.  Pt stated that he is allergic to steroids      Observations/Objective:   Assessment and Plan: Bronchitis. RX Zpack  Continue mucinex for cough as needed.   Follow Up Instructions:    I discussed the assessment and treatment plan with the patient. The patient was provided an opportunity to ask questions and all were answered. The patient agreed with the plan and demonstrated an understanding of the instructions.   The patient was advised to call back or seek an in-person evaluation if the symptoms worsen or if the condition fails to improve as anticipated.

## 2021-12-30 NOTE — Assessment & Plan Note (Signed)
RX Zpack  Continue mucinex for cough as needed.

## 2021-12-30 NOTE — Telephone Encounter (Signed)
ERROR

## 2021-12-31 ENCOUNTER — Ambulatory Visit: Payer: Medicare Other | Admitting: Cardiology

## 2022-01-04 DIAGNOSIS — Z20822 Contact with and (suspected) exposure to covid-19: Secondary | ICD-10-CM | POA: Diagnosis not present

## 2022-01-06 ENCOUNTER — Ambulatory Visit (INDEPENDENT_AMBULATORY_CARE_PROVIDER_SITE_OTHER): Payer: Medicare Other | Admitting: Nurse Practitioner

## 2022-01-06 ENCOUNTER — Other Ambulatory Visit: Payer: Self-pay

## 2022-01-06 ENCOUNTER — Encounter: Payer: Self-pay | Admitting: Nurse Practitioner

## 2022-01-06 VITALS — BP 104/68 | HR 85 | Ht 73.0 in | Wt 220.1 lb

## 2022-01-06 DIAGNOSIS — I10 Essential (primary) hypertension: Secondary | ICD-10-CM | POA: Diagnosis not present

## 2022-01-06 DIAGNOSIS — B029 Zoster without complications: Secondary | ICD-10-CM | POA: Diagnosis not present

## 2022-01-06 MED ORDER — VALACYCLOVIR HCL 1 G PO TABS: 1000.0000 mg | ORAL_TABLET | Freq: Three times a day (TID) | ORAL | 0 refills | Status: AC

## 2022-01-06 NOTE — Assessment & Plan Note (Signed)
Has burning pain. RX valtrex 1000mg  TID for 7 days.  Use Tylenol as needed for pain Rashes in the left thigh area.  Patient encouraged to get his shingles vaccine, to prevent recurrent shingles infection.  He verbalized understanding

## 2022-01-06 NOTE — Assessment & Plan Note (Signed)
DASH diet and commitment to daily physical activity for a minimum of 30 minutes discussed and encouraged, as a part of hypertension management. The importance of attaining a healthy weight is also discussed.  BP/Weight 01/06/2022 11/12/2021 09/24/2021 08/05/2021 07/16/2021 06/19/2021 2/00/3794  Systolic BP 446 190 122 - 241 146 431  Diastolic BP 68 76 89 - 78 80 81  Wt. (Lbs) 220.12 212.08 210 216 216 214 -  BMI 29.04 27.98 27.71 28.5 28.5 28.23 -  Some encounter information is confidential and restricted. Go to Review Flowsheets activity to see all data.

## 2022-01-06 NOTE — Patient Instructions (Addendum)
Please take valtrex 1000 mg three times daily for 7 days. Please use tylenol as needed for your pain.     It is important that you exercise regularly at least 30 minutes 5 times a week.  Think about what you will eat, plan ahead. Choose " clean, green, fresh or frozen" over canned, processed or packaged foods which are more sugary, salty and fatty. 70 to 75% of food eaten should be vegetables and fruit. Three meals at set times with snacks allowed between meals, but they must be fruit or vegetables. Aim to eat over a 12 hour period , example 7 am to 7 pm, and STOP after  your last meal of the day. Drink water,generally about 64 ounces per day, no other drink is as healthy. Fruit juice is best enjoyed in a healthy way, by EATING the fruit.  Thanks for choosing [facility], we consider it a privelige to serve you.

## 2022-01-06 NOTE — Progress Notes (Signed)
° °  Dennis [name]     MRN: [id number]      DOB: [birth year]   HPI Dennis [name] is here for c/o shingles rashes since around 12/30/21. It started with his skin been sensitive to touch, 4 days after rashes erupted. Has rashes on the left thigh , rashes are painful. He stated that he  has had shingles in the past and was treated with valtrex.    Patient was recently treated for bronchitis, with Z-Pak he states that he feels ok now, patient has been noncompliant with his  inhalers according to the patient's wife.  Patient encouraged to use his inhalers as ordered he verbalized understanding.  ROS Denies recent fever or chills. Denies sinus pressure, nasal congestion, ear pain or sore throat. Denies chest congestion, productive cough or wheezing. Denies chest pains, palpitations and leg swelling Denies abdominal pain, nausea, vomiting,diarrhea or constipation.   Denies dysuria, frequency, hesitancy or incontinence. Denies joint pain, swelling and limitation in mobility. Denies headaches, seizures, numbness, or tingling. Denies depression, anxiety or insomnia. Has rashes left thigh. .   PE  BP 104/68 (BP Location: Left Arm, Patient Position: Sitting, Cuff Size: Normal)    Pulse 85    Ht 6\' 1"  (1.854 m)    Wt 220 lb 1.9 oz (99.8 kg)    SpO2 99%    BMI 29.04 kg/m   Patient alert and oriented and in no cardiopulmonary distress.  HEENT: No facial asymmetry, EOMI,     Neck supple .  Chest: Clear to auscultation bilaterally.  CVS: S1, S2 no murmurs, no S3.Regular rate.  ABD: Soft non tender.   Ext: No edema  MS: Adequate ROM spine, shoulders, hips and knees.  Skin: pruritic grouped  rashes on anterior and posterior thigh,  non vesicular, Rashes appear red,    Psych: Good eye contact, normal affect. Memory intact not anxious or depressed appearing.  CNS: CN 2-12 intact, power,  normal throughout.no focal deficits noted.   Assessment & Plan

## 2022-01-09 ENCOUNTER — Ambulatory Visit: Payer: Medicare Other | Admitting: Internal Medicine

## 2022-01-27 ENCOUNTER — Other Ambulatory Visit: Payer: Self-pay | Admitting: Internal Medicine

## 2022-01-27 DIAGNOSIS — I10 Essential (primary) hypertension: Secondary | ICD-10-CM

## 2022-02-07 DIAGNOSIS — Z20822 Contact with and (suspected) exposure to covid-19: Secondary | ICD-10-CM | POA: Diagnosis not present

## 2022-02-10 DIAGNOSIS — Z20822 Contact with and (suspected) exposure to covid-19: Secondary | ICD-10-CM | POA: Diagnosis not present

## 2022-02-13 ENCOUNTER — Ambulatory Visit: Payer: Medicare Other | Admitting: Internal Medicine

## 2022-02-21 DIAGNOSIS — Z20822 Contact with and (suspected) exposure to covid-19: Secondary | ICD-10-CM | POA: Diagnosis not present

## 2022-02-26 DIAGNOSIS — R7303 Prediabetes: Secondary | ICD-10-CM | POA: Diagnosis not present

## 2022-02-26 DIAGNOSIS — J42 Unspecified chronic bronchitis: Secondary | ICD-10-CM | POA: Diagnosis not present

## 2022-02-26 DIAGNOSIS — Z125 Encounter for screening for malignant neoplasm of prostate: Secondary | ICD-10-CM | POA: Diagnosis not present

## 2022-02-26 DIAGNOSIS — N401 Enlarged prostate with lower urinary tract symptoms: Secondary | ICD-10-CM | POA: Diagnosis not present

## 2022-02-26 DIAGNOSIS — T7840XD Allergy, unspecified, subsequent encounter: Secondary | ICD-10-CM | POA: Diagnosis not present

## 2022-02-26 DIAGNOSIS — I10 Essential (primary) hypertension: Secondary | ICD-10-CM | POA: Diagnosis not present

## 2022-02-26 DIAGNOSIS — E559 Vitamin D deficiency, unspecified: Secondary | ICD-10-CM | POA: Diagnosis not present

## 2022-02-26 DIAGNOSIS — R350 Frequency of micturition: Secondary | ICD-10-CM | POA: Diagnosis not present

## 2022-02-27 LAB — CBC WITH DIFFERENTIAL/PLATELET
Basophils Absolute: 0.1 10*3/uL (ref 0.0–0.2)
Basos: 1 %
EOS (ABSOLUTE): 0.1 10*3/uL (ref 0.0–0.4)
Eos: 2 %
Hematocrit: 43 % (ref 37.5–51.0)
Hemoglobin: 13.9 g/dL (ref 13.0–17.7)
Immature Grans (Abs): 0 10*3/uL (ref 0.0–0.1)
Immature Granulocytes: 0 %
Lymphocytes Absolute: 2.2 10*3/uL (ref 0.7–3.1)
Lymphs: 36 %
MCH: 30.2 pg (ref 26.6–33.0)
MCHC: 32.3 g/dL (ref 31.5–35.7)
MCV: 93 fL (ref 79–97)
Monocytes Absolute: 0.6 10*3/uL (ref 0.1–0.9)
Monocytes: 10 %
Neutrophils Absolute: 3.1 10*3/uL (ref 1.4–7.0)
Neutrophils: 51 %
Platelets: 150 10*3/uL (ref 150–450)
RBC: 4.61 x10E6/uL (ref 4.14–5.80)
RDW: 12.3 % (ref 11.6–15.4)
WBC: 6.1 10*3/uL (ref 3.4–10.8)

## 2022-02-27 LAB — CMP14+EGFR
ALT: 17 IU/L (ref 0–44)
AST: 27 IU/L (ref 0–40)
Albumin/Globulin Ratio: 2.3 — ABNORMAL HIGH (ref 1.2–2.2)
Albumin: 4.3 g/dL (ref 3.8–4.8)
Alkaline Phosphatase: 24 IU/L — ABNORMAL LOW (ref 44–121)
BUN/Creatinine Ratio: 13 (ref 10–24)
BUN: 18 mg/dL (ref 8–27)
Bilirubin Total: 0.5 mg/dL (ref 0.0–1.2)
CO2: 27 mmol/L (ref 20–29)
Calcium: 9.7 mg/dL (ref 8.6–10.2)
Chloride: 99 mmol/L (ref 96–106)
Creatinine, Ser: 1.41 mg/dL — ABNORMAL HIGH (ref 0.76–1.27)
Globulin, Total: 1.9 g/dL (ref 1.5–4.5)
Glucose: 90 mg/dL (ref 70–99)
Potassium: 4.5 mmol/L (ref 3.5–5.2)
Sodium: 138 mmol/L (ref 134–144)
Total Protein: 6.2 g/dL (ref 6.0–8.5)
eGFR: 54 mL/min/{1.73_m2} — ABNORMAL LOW (ref >59)

## 2022-02-27 LAB — VITAMIN D 25 HYDROXY (VIT D DEFICIENCY, FRACTURES): Vit D, 25-Hydroxy: 12.8 ng/mL — ABNORMAL LOW (ref 30.0–100.0)

## 2022-02-27 LAB — PSA: Prostate Specific Ag, Serum: 0.8 ng/mL (ref 0.0–4.0)

## 2022-02-27 LAB — HEMOGLOBIN A1C
Est. average glucose Bld gHb Est-mCnc: 123 mg/dL
Hgb A1c MFr Bld: 5.9 % — ABNORMAL HIGH (ref 4.8–5.6)

## 2022-03-06 DIAGNOSIS — Z20822 Contact with and (suspected) exposure to covid-19: Secondary | ICD-10-CM | POA: Diagnosis not present

## 2022-03-10 ENCOUNTER — Other Ambulatory Visit: Payer: Self-pay | Admitting: Internal Medicine

## 2022-03-10 DIAGNOSIS — I10 Essential (primary) hypertension: Secondary | ICD-10-CM

## 2022-03-10 DIAGNOSIS — E785 Hyperlipidemia, unspecified: Secondary | ICD-10-CM

## 2022-03-11 DIAGNOSIS — Z20822 Contact with and (suspected) exposure to covid-19: Secondary | ICD-10-CM | POA: Diagnosis not present

## 2022-03-12 ENCOUNTER — Other Ambulatory Visit: Payer: Self-pay | Admitting: Internal Medicine

## 2022-03-12 DIAGNOSIS — L218 Other seborrheic dermatitis: Secondary | ICD-10-CM | POA: Diagnosis not present

## 2022-03-12 DIAGNOSIS — L820 Inflamed seborrheic keratosis: Secondary | ICD-10-CM | POA: Diagnosis not present

## 2022-03-12 DIAGNOSIS — G4700 Insomnia, unspecified: Secondary | ICD-10-CM

## 2022-03-13 ENCOUNTER — Encounter: Payer: Self-pay | Admitting: Internal Medicine

## 2022-03-13 ENCOUNTER — Ambulatory Visit (INDEPENDENT_AMBULATORY_CARE_PROVIDER_SITE_OTHER): Payer: Medicare Other | Admitting: Internal Medicine

## 2022-03-13 VITALS — BP 118/72 | HR 59 | Resp 18 | Ht 73.0 in | Wt 221.4 lb

## 2022-03-13 DIAGNOSIS — E559 Vitamin D deficiency, unspecified: Secondary | ICD-10-CM

## 2022-03-13 DIAGNOSIS — I10 Essential (primary) hypertension: Secondary | ICD-10-CM | POA: Diagnosis not present

## 2022-03-13 DIAGNOSIS — E785 Hyperlipidemia, unspecified: Secondary | ICD-10-CM

## 2022-03-13 DIAGNOSIS — I739 Peripheral vascular disease, unspecified: Secondary | ICD-10-CM | POA: Diagnosis not present

## 2022-03-13 DIAGNOSIS — F419 Anxiety disorder, unspecified: Secondary | ICD-10-CM | POA: Diagnosis not present

## 2022-03-13 DIAGNOSIS — I483 Typical atrial flutter: Secondary | ICD-10-CM

## 2022-03-13 DIAGNOSIS — N1831 Chronic kidney disease, stage 3a: Secondary | ICD-10-CM | POA: Diagnosis not present

## 2022-03-13 DIAGNOSIS — J42 Unspecified chronic bronchitis: Secondary | ICD-10-CM | POA: Diagnosis not present

## 2022-03-13 DIAGNOSIS — I2510 Atherosclerotic heart disease of native coronary artery without angina pectoris: Secondary | ICD-10-CM

## 2022-03-13 DIAGNOSIS — Z20822 Contact with and (suspected) exposure to covid-19: Secondary | ICD-10-CM | POA: Diagnosis not present

## 2022-03-13 MED ORDER — VITAMIN D (ERGOCALCIFEROL) 1.25 MG (50000 UNIT) PO CAPS: 50000.0000 [IU] | ORAL_CAPSULE | ORAL | 1 refills | Status: DC

## 2022-03-13 NOTE — Progress Notes (Signed)
? ?Established Patient Office Visit ? ?Subjective:  ?Patient ID: Dennis [name], male    DOB: 22-Mar-1951  Age: 71 y.o. MRN: [id number] ? ?CC:  ?Chief Complaint  ?Patient presents with  ? Follow-up  ?  4 month follow up HTN COPD HLD  ? ? ?HPI ?Dennis [name] is a 71 y.o. male with past medical history of CAD s/p CABG, HTN, HLD, COPD, anxiety and insomnia who presents for f/u of his chronic medical conditions. ? ?COPD: He uses Symbicort and as needed albuterol, which he has not been requiring frequently.  He denies any fever, chills, chest pain or palpitations currently. ? ?Anxiety/insomnia/PTSD: Currently well controlled with Xanax.  He gets intermittent episodes of severe anxiety spells, where he gets flushed and has mild dyspnea, but they have been less frequent recently.  He also takes Ambien for insomnia. ? ?HTN and CAD: BP is well-controlled. Takes medications regularly. Patient denies headache, dizziness, chest pain, dyspnea or palpitations. ? ?He has stopped using Praluent as it was causing severe rash and fatigue.  He continues to take Vytorin for now.  He states that he is not going to f/u with Dr Debara Pickett now. ? ?He complains of leg pain, worse with walking.  He denies any cold sensation or pallor of the feet.  He has had [place] ABI of LE in the past, which was unremarkable. ? ? ? ? ? ?Past Medical History:  ?Diagnosis Date  ? Atrial flutter ([place])   ? Diagnosed by ECG August 2015  ? Colitis   ? COPD (chronic obstructive pulmonary disease) ([place])   ? Coronary atherosclerosis of native coronary artery   ? Multivessel status post CABG  ? Essential hypertension   ? Hyperlipidemia   ? Insomnia   ? ? ?Past Surgical History:  ?Procedure Laterality Date  ? COLONOSCOPY  01/29/2005  ? Dr. Gala Romney; rectal polyp s/p polypectomy, otherwise normal.  Pathology with tubulovillous adenoma.  ? COLONOSCOPY  02/06/2008  ? Dr. Gala Romney; minimal internal hemorrhoids, diminutive polyp in the splenic flexure s/p cold biopsy removal, otherwise  normal.  Pathology with benign polypoid colonic mucosa.  ? COLONOSCOPY WITH PROPOFOL N/A 08/30/2015  ? Procedure: COLONOSCOPY WITH PROPOFOL at cecum at 0814; withdrawal time=8 minutes;  Surgeon: Daneil Dolin, MD; internal hemorrhoids-likely source of hematochezia, otherwise normal exam.  Repeat in 5 years.  ? COLONOSCOPY WITH PROPOFOL N/A 01/28/2021  ? Procedure: COLONOSCOPY WITH PROPOFOL;  Surgeon: Daneil Dolin, MD;  [facility];  Service: Endoscopy;  Laterality: N/A;  am appt  ? CORONARY ARTERY BYPASS GRAFT  2005  ? Dr. Lucianne Lei Trigt: LIMA to LAD, right radial to circumflex, SVG to RCA  ? CORONARY ARTERY BYPASS GRAFT  10/16/2004  ? ELECTROPHYSIOLOGIC STUDY N/A 11/21/2016  ? Procedure: A-Flutter Ablation;  Surgeon: Evans Lance, MD;  Location: [facility];  Service: Cardiovascular;  Laterality: N/A;  ? TEE WITHOUT CARDIOVERSION N/A 11/21/2016  ? Procedure: TRANSESOPHAGEAL ECHOCARDIOGRAM (TEE);  Surgeon: Sanda Klein, MD;  Location: [facility];  Service: Cardiovascular;  Laterality: N/A;  ? ? ?Family History  ?Problem Relation Age of Onset  ? Hypertension Mother   ? Alcohol abuse Mother   ? Alcohol abuse Father   ? Colon cancer Neg Hx   ? ? ?Social History  ? ?Socioeconomic History  ? Marital status: Married  ?  Spouse name: Not on file  ? Number of children: 3  ? Years of education: Not on file  ? Highest education level: Not  on file  ?Occupational History  ? Not on file  ?Tobacco Use  ? Smoking status: Former  ?  Packs/day: 2.00  ?  Years: 27.00  ?  Pack years: 54.00  ?  Types: Cigarettes  ?  Start date: 02/01/1971  ?  Quit date: 01/31/1998  ?  Years since quitting: 24.1  ? Smokeless tobacco: Current  ?  Types: Chew  ?Vaping Use  ? Vaping Use: Never used  ?Substance and Sexual Activity  ? Alcohol use: Yes  ?  Alcohol/week: 0.0 standard drinks  ?  Comment: Drinks beer daily, typically 2-4 a day. Also 4 oz red wine an evening.  ? Drug use: No  ? Sexual activity: Not on file  ?Other Topics  Concern  ? Not on file  ?Social History Narrative  ? Lives with wife, Anderson Malta  ? ?Social Determinants of Health  ? ?Financial Resource Strain: Medium Risk  ? Difficulty of Paying Living Expenses: Somewhat hard  ?Food Insecurity: No Food Insecurity  ? Worried About Charity fundraiser in the Last Year: Never true  ? Ran Out of Food in the Last Year: Never true  ?Transportation Needs: No Transportation Needs  ? Lack of Transportation (Medical): No  ? Lack of Transportation (Non-Medical): No  ?Physical Activity: Insufficiently Active  ? Days of Exercise per Week: 3 days  ? Minutes of Exercise per Session: 30 min  ?Stress: No Stress Concern Present  ? Feeling of Stress : Only a little  ?Social Connections: Moderately Isolated  ? Frequency of Communication with Friends and Family: More than three times a week  ? Frequency of Social Gatherings with Friends and Family: More than three times a week  ? Attends Religious Services: Never  ? Active Member of Clubs or Organizations: No  ? Attends Archivist Meetings: Never  ? Marital Status: Married  ?Intimate Partner Violence: Not At Risk  ? Fear of Current or Ex-Partner: No  ? Emotionally Abused: No  ? Physically Abused: No  ? Sexually Abused: No  ? ? ?Outpatient Medications Prior to Visit  ?Medication Sig Dispense Refill  ? albuterol (PROVENTIL) (2.5 MG/3ML) 0.083% nebulizer solution Take 3 mLs (2.5 mg total) by nebulization every 6 (six) hours as needed for wheezing or shortness of breath. 150 mL 3  ? albuterol (VENTOLIN HFA) 108 (90 Base) MCG/ACT inhaler Inhale 2 puffs into the lungs every 4 (four) hours as needed for wheezing. 18 g 5  ? ALPRAZolam (XANAX) 0.5 MG tablet TAKE 0.5 TABLETS BY MOUTH 2 TIMES DAILY AS NEEDED FOR ANXIETY. 30 tablet 1  ? aspirin EC 81 MG tablet Take 1 tablet (81 mg total) by mouth daily. (Patient taking differently: Take 81 mg by mouth 2 (two) times a week.) 30 tablet 11  ? budesonide-formoterol (SYMBICORT) 160-4.5 MCG/ACT inhaler TAKE 2  PUFFS FIRST THING IN MORNING AND THEN ANOTHER 2 PUFFS ABOUT 12 HOURS LATER. 30.6 each 1  ? ezetimibe-simvastatin (VYTORIN) 10-40 MG tablet TAKE 1 TABLET BY MOUTH AT BEDTIME. TAKE 1 TABLET BY MOUTH EVERYDAY AT BEDTIME 90 tablet 1  ? fenofibrate 160 MG tablet TAKE 1 TABLET BY MOUTH EVERY DAY 90 tablet 1  ? fish oil-omega-3 fatty acids 1000 MG capsule Take 1 g by mouth in the morning and at bedtime.    ? guaiFENesin (MUCINEX) 600 MG 12 hr tablet Take 1 tablet (600 mg total) by mouth 2 (two) times daily. 20 tablet 0  ? ketoconazole (NIZORAL) 2 % cream Apply 1 application  topically daily.    ? losartan-hydrochlorothiazide (HYZAAR) 100-25 MG tablet TAKE 1 TABLET BY MOUTH EVERY DAY 90 tablet 0  ? methocarbamol (ROBAXIN) 500 MG tablet TAKE 1 TABLET BY MOUTH EVERY 8 HOURS AS NEEDED FOR MUSCLE SPASMS. 30 tablet 0  ? metoprolol tartrate (LOPRESSOR) 50 MG tablet TAKE 1 TABLET BY MOUTH TWICE A DAY 180 tablet 1  ? mupirocin ointment (BACTROBAN) 2 % Apply 1 application topically 2 (two) times daily. (Patient taking differently: Apply 1 application. topically 2 (two) times daily as needed (wound care).) 22 g 0  ? nitroGLYCERIN (NITROSTAT) 0.4 MG SL tablet Place 1 tablet (0.4 mg total) under the tongue every 5 (five) minutes as needed. 25 tablet 3  ? nystatin (MYCOSTATIN/NYSTOP) powder Apply 1 application topically 3 (three) times daily. 15 g 0  ? Respiratory Therapy Supplies (NEBULIZER/TUBING/MOUTHPIECE) KIT Nebulizer tubing and mouthpiece 1 kit 0  ? tamsulosin (FLOMAX) 0.4 MG CAPS capsule TAKE 1 CAPSULE BY MOUTH EVERY DAY 90 capsule 0  ? zolpidem (AMBIEN) 10 MG tablet TAKE 1 TABLET BY MOUTH EVERY DAY AT BEDTIME AS NEEDED FOR SLEEP 30 tablet 3  ? terbinafine (LAMISIL) 250 MG tablet Take 1 tablet (250 mg total) by mouth daily. 30 tablet 2  ? ?No facility-administered medications prior to visit.  ? ? ?Allergies  ?Allergen Reactions  ? Ace Inhibitors Shortness Of Breath and Cough  ? Enalapril Cough  ?  Pt has been switched to  Valsartan and is tolerating the different class.  ? Dexamethasone Itching  ?  Pt was prescribed this in december  ? Alirocumab Rash and Other (See Comments)  ?  blisters  ? Clotrimazole-Betamethasone Rash  ? Pred

## 2022-03-13 NOTE — Assessment & Plan Note (Signed)
Used to follow up with lipid clinic ?Was on Praluent, had severe fatigue and rash with it -could not tolerate it ?Continue Vytorin for now ?Check lipid profile ?

## 2022-03-13 NOTE — Assessment & Plan Note (Signed)
Quit smoking in 1999 ?On Symbicort and Albuterol ?Advised to comply with Symbicort to avoid frequent exacerbations ?

## 2022-03-13 NOTE — Assessment & Plan Note (Signed)
S/p cardiac ablation ?In sinus rhythm currently ?On Metoprolol ?

## 2022-03-13 NOTE — Assessment & Plan Note (Signed)
Has leg claudication symptoms ?Was on aspirin in the past, but had recurrent GI bleeding with it ?On Vytorin currently ?DPA pulse intact currently ?[place] ABI in 2015 unremarkable ?Prefers to wait for vascular surgery referral for now ?
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
## 2022-03-13 NOTE — Assessment & Plan Note (Signed)
S/p CABG in the past ?Recent nuclear study showed no active ischemia with LVEF 51%. ?Continue Aspirin and Vytorin ?Continue Metoprolol ?

## 2022-03-13 NOTE — Patient Instructions (Addendum)
Please continue taking medications as prescribed.  Please continue to follow low salt diet and ambulate as tolerated. 

## 2022-03-13 NOTE — Assessment & Plan Note (Signed)
CMP reviewed ?S. Cr. 1.41, was 1.30 in the last CMP ?Will monitor for now ?Check urine microalbumine/creatinine ratio ?On ARB ?Advised for proper hydration ?Avoid nephrotoxic agents ?

## 2022-03-13 NOTE — Assessment & Plan Note (Signed)
BP Readings from Last 1 Encounters:  ?03/13/22 118/72  ? ?Well-controlled with Hyzaar and Metoprolol ?Counseled for compliance with the medications ?Advised DASH diet and moderate exercise/walking, at least 150 mins/week ?

## 2022-03-14 ENCOUNTER — Telehealth: Payer: Self-pay

## 2022-03-14 NOTE — Telephone Encounter (Signed)
Needs copy of last 2 blood work results.  Will pick up in office. ?

## 2022-03-14 NOTE — Telephone Encounter (Signed)
Lab results printed for pt ?

## 2022-03-24 ENCOUNTER — Encounter: Payer: Self-pay | Admitting: Internal Medicine

## 2022-03-24 ENCOUNTER — Ambulatory Visit (INDEPENDENT_AMBULATORY_CARE_PROVIDER_SITE_OTHER): Payer: Medicare Other | Admitting: Internal Medicine

## 2022-03-24 DIAGNOSIS — U071 COVID-19: Secondary | ICD-10-CM

## 2022-03-24 MED ORDER — NIRMATRELVIR/RITONAVIR (PAXLOVID) TABLET (RENAL DOSING): 2.0000 | ORAL_TABLET | Freq: Two times a day (BID) | ORAL | 0 refills | Status: AC

## 2022-03-24 NOTE — Progress Notes (Signed)
?  ? ?Virtual Visit via Telephone Note  ? ?This visit type was conducted due to national recommendations for restrictions regarding the COVID-19 Pandemic (e.g. social distancing) in an effort to limit this patient's exposure and mitigate transmission in our community.  Due to his co-morbid illnesses, this patient is at least at moderate risk for complications without adequate follow up.  This format is felt to be most appropriate for this patient at this time.  The patient did not have access to video technology/had technical difficulties with video requiring transitioning to audio format only (telephone).  All issues noted in this document were discussed and addressed.  No physical exam could be performed with this format. ? ?Evaluation Performed:  Follow-up visit ? ?Date:  03/24/2022  ? ?ID:  CEEJAY [name], DOB 23-Mar-1951, MRN [id number] ? ?Patient Location: Home ?Provider Location: Office/Clinic ? ?Participants: Patient ?Location of Patient: Home ?Location of Provider: Telehealth ?Consent was obtain for visit to be over via telehealth. ?I verified that I am speaking with the correct person using two identifiers. ? ?PCP:  Dennis Spar, MD  ? ?Chief Complaint: Cough and fever ? ?History of Present Illness:   ? ?Dennis [name] is a 71 y.o. male who has a televisit for complaint of fever, fatigue, headache, cough, loss of taste and dyspnea for the last 2 days.  He tested positive for COVID yesterday.  He has been using his Symbicort and as needed albuterol for dyspnea with relief. ? ?The patient does have symptoms concerning for COVID-19 infection (fever, chills, cough, or new shortness of breath).  ? ?Past Medical, Surgical, Social History, Allergies, and Medications have been Reviewed. ? ?Past Medical History:  ?Diagnosis Date  ? Atrial flutter ([place])   ? Diagnosed by ECG August 2015  ? Colitis   ? COPD (chronic obstructive pulmonary disease) ([place])   ? Coronary atherosclerosis of native coronary artery   ?  Multivessel status post CABG  ? Essential hypertension   ? Hyperlipidemia   ? Insomnia   ? ?Past Surgical History:  ?Procedure Laterality Date  ? COLONOSCOPY  01/29/2005  ? Dr. Gala [name]; rectal polyp s/p polypectomy, otherwise normal.  Pathology with tubulovillous adenoma.  ? COLONOSCOPY  02/06/2008  ? Dr. Gala [name]; minimal internal hemorrhoids, diminutive polyp in the splenic flexure s/p cold biopsy removal, otherwise normal.  Pathology with benign polypoid colonic mucosa.  ? COLONOSCOPY WITH PROPOFOL N/A 08/30/2015  ? Procedure: COLONOSCOPY WITH PROPOFOL at cecum at 0814; withdrawal time=8 minutes;  Surgeon: Dennis Dolin, MD; internal hemorrhoids-likely source of hematochezia, otherwise normal exam.  Repeat in 5 years.  ? COLONOSCOPY WITH PROPOFOL N/A 01/28/2021  ? Procedure: COLONOSCOPY WITH PROPOFOL;  Surgeon: Dennis Dolin, MD;  [facility];  Service: Endoscopy;  Laterality: N/A;  am appt  ? CORONARY ARTERY BYPASS GRAFT  2005  ? Dr. Lucianne Lei [name]: LIMA to LAD, right radial to circumflex, SVG to RCA  ? CORONARY ARTERY BYPASS GRAFT  10/16/2004  ? ELECTROPHYSIOLOGIC STUDY N/A 11/21/2016  ? Procedure: A-Flutter Ablation;  Surgeon: Dennis Lance, MD;  Location: [facility];  Service: Cardiovascular;  Laterality: N/A;  ? TEE WITHOUT CARDIOVERSION N/A 11/21/2016  ? Procedure: TRANSESOPHAGEAL ECHOCARDIOGRAM (TEE);  Surgeon: Dennis Klein, MD;  Location: [facility];  Service: Cardiovascular;  Laterality: N/A;  ?  ? ?Current Meds  ?Medication Sig  ? albuterol (PROVENTIL) (2.5 MG/3ML) 0.083% nebulizer solution Take 3 mLs (2.5 mg total) by nebulization every 6 (six) hours as needed for  wheezing or shortness of breath.  ? albuterol (VENTOLIN HFA) 108 (90 Base) MCG/ACT inhaler Inhale 2 puffs into the lungs every 4 (four) hours as needed for wheezing.  ? ALPRAZolam (XANAX) 0.5 MG tablet TAKE 0.5 TABLETS BY MOUTH 2 TIMES DAILY AS NEEDED FOR ANXIETY.  ? aspirin EC 81 MG tablet Take 1 tablet (81 mg total) by mouth  daily. (Patient taking differently: Take 81 mg by mouth 2 (two) times a week.)  ? budesonide-formoterol (SYMBICORT) 160-4.5 MCG/ACT inhaler TAKE 2 PUFFS FIRST THING IN MORNING AND THEN ANOTHER 2 PUFFS ABOUT 12 HOURS LATER.  ? ezetimibe-simvastatin (VYTORIN) 10-40 MG tablet TAKE 1 TABLET BY MOUTH AT BEDTIME. TAKE 1 TABLET BY MOUTH EVERYDAY AT BEDTIME  ? fenofibrate 160 MG tablet TAKE 1 TABLET BY MOUTH EVERY DAY  ? fish oil-omega-3 fatty acids 1000 MG capsule Take 1 g by mouth in the morning and at bedtime.  ? guaiFENesin (MUCINEX) 600 MG 12 hr tablet Take 1 tablet (600 mg total) by mouth 2 (two) times daily.  ? ketoconazole (NIZORAL) 2 % cream Apply 1 application topically daily.  ? losartan-hydrochlorothiazide (HYZAAR) 100-25 MG tablet TAKE 1 TABLET BY MOUTH EVERY DAY  ? methocarbamol (ROBAXIN) 500 MG tablet TAKE 1 TABLET BY MOUTH EVERY 8 HOURS AS NEEDED FOR MUSCLE SPASMS.  ? metoprolol tartrate (LOPRESSOR) 50 MG tablet TAKE 1 TABLET BY MOUTH TWICE A DAY  ? mupirocin ointment (BACTROBAN) 2 % Apply 1 application topically 2 (two) times daily. (Patient taking differently: Apply 1 application. topically 2 (two) times daily as needed (wound care).)  ? nitroGLYCERIN (NITROSTAT) 0.4 MG SL tablet Place 1 tablet (0.4 mg total) under the tongue every 5 (five) minutes as needed.  ? nystatin (MYCOSTATIN/NYSTOP) powder Apply 1 application topically 3 (three) times daily.  ? Respiratory Therapy Supplies (NEBULIZER/TUBING/MOUTHPIECE) KIT Nebulizer tubing and mouthpiece  ? tamsulosin (FLOMAX) 0.4 MG CAPS capsule TAKE 1 CAPSULE BY MOUTH EVERY DAY  ? Vitamin D, Ergocalciferol, (DRISDOL) 1.25 MG (50000 UNIT) CAPS capsule Take 1 capsule (50,000 Units total) by mouth every 7 (seven) days.  ? zolpidem (AMBIEN) 10 MG tablet TAKE 1 TABLET BY MOUTH EVERY DAY AT BEDTIME AS NEEDED FOR SLEEP  ?  ? ?Allergies:   Ace inhibitors, Enalapril, Dexamethasone, Alirocumab, Clotrimazole-betamethasone, and Prednisone  ? ?ROS:   ?Please see the history  of present illness.    ? ?All other systems reviewed and are negative. ? ? ?Labs/Other Tests and Data Reviewed:   ? ?Recent Labs: ?07/03/2021: TSH 3.620 ?02/26/2022: ALT 17; BUN 18; Creatinine, Ser 1.41; Hemoglobin 13.9; Platelets 150; Potassium 4.5; Sodium 138  ? ?Recent Lipid Panel ?Lab Results  ?Component Value Date/Time  ? CHOL 232 (H) 07/03/2021 08:20 AM  ? TRIG 146 07/03/2021 08:20 AM  ? HDL 43 07/03/2021 08:20 AM  ? CHOLHDL 5.4 (H) 07/03/2021 08:20 AM  ? CHOLHDL 9.4 (H) 01/11/2018 08:33 AM  ? LDLCALC 162 (H) 07/03/2021 08:20 AM  ? [place]  01/11/2018 08:33 AM  ?   Comment:  ?   . ?LDL cholesterol not calculated. Triglyceride levels ?greater than 400 mg/dL invalidate calculated LDL results. ?. ?Reference range: <100 ?Marland Kitchen ?Desirable range <100 mg/dL for primary prevention;   ?<70 mg/dL for patients with CHD or diabetic patients  ?with > or = 2 CHD risk factors. ?. ?LDL-C is now calculated using the Martin-Hopkins  ?calculation, which is a validated novel method providing  ?better accuracy than the Friedewald equation in the  ?estimation of LDL-C.  ?Cresenciano Genre et al.  JAMA. 5844;171(27): 2061-2068  ?(http://education.[facility].com/faq/FAQ164) ?  ? ? ?Wt Readings from Last 3 Encounters:  ?03/13/22 221 lb 6.4 oz (100.4 kg)  ?01/06/22 220 lb 1.9 oz (99.8 kg)  ?11/12/21 212 lb 1.3 oz (96.2 kg)  ?  ? ?ASSESSMENT & PLAN:   ? ?COVID-19 infection ?Started Paxlovid -renally dosed ?Continue Symbicort and as needed albuterol for dyspnea ?Mucinex or Robitussin as needed for cough ?Tylenol as needed for fever and myalgias ? ?Time:   ?Today, I have spent 9 minutes reviewing the chart, including problem list, medications, and with the patient with telehealth technology discussing the above problems. ? ? ?Medication Adjustments/Labs and Tests Ordered: ?Current medicines are reviewed at length with the patient today.  Concerns regarding medicines are outlined above.  ? ?Tests Ordered: ?No orders of the defined types were placed  in this encounter. ? ? ?Medication Changes: ?No orders of the defined types were placed in this encounter. ? ? ? ?Note: This dictation was prepared with Dragon dictation along with smaller phrase technology.

## 2022-03-25 DIAGNOSIS — Z20822 Contact with and (suspected) exposure to covid-19: Secondary | ICD-10-CM | POA: Diagnosis not present

## 2022-04-02 ENCOUNTER — Ambulatory Visit (INDEPENDENT_AMBULATORY_CARE_PROVIDER_SITE_OTHER): Payer: Medicare Other | Admitting: Internal Medicine

## 2022-04-02 ENCOUNTER — Encounter: Payer: Self-pay | Admitting: Internal Medicine

## 2022-04-02 DIAGNOSIS — J0110 Acute frontal sinusitis, unspecified: Secondary | ICD-10-CM

## 2022-04-02 MED ORDER — AMOXICILLIN-POT CLAVULANATE 875-125 MG PO TABS: 1.0000 | ORAL_TABLET | Freq: Two times a day (BID) | ORAL | 0 refills | Status: DC

## 2022-04-02 NOTE — Progress Notes (Signed)
?  ? ?Virtual Visit via Telephone Note  ? ?This visit type was conducted due to national recommendations for restrictions regarding the COVID-19 Pandemic (e.g. social distancing) in an effort to limit this patient's exposure and mitigate transmission in our community.  Due to his co-morbid illnesses, this patient is at least at moderate risk for complications without adequate follow up.  This format is felt to be most appropriate for this patient at this time.  The patient did not have access to video technology/had technical difficulties with video requiring transitioning to audio format only (telephone).  All issues noted in this document were discussed and addressed.  No physical exam could be performed with this format. ? ?Evaluation Performed:  Follow-up visit ? ?Date:  04/02/2022  ? ?ID:  Dennis [name], DOB 08-Sep-1951, MRN [id number] ? ?Patient Location: Home ?Provider Location: Office/Clinic ? ?Participants: Patient ?Location of Patient: Home ?Location of Provider: Telehealth ?Consent was obtain for visit to be over via telehealth. ?I verified that I am speaking with the correct person using two identifiers. ? ?PCP:  Lindell Spar, MD  ? ?Chief Complaint: Nasal congestion, postnasal drip and cough ? ?History of Present Illness:   ? ?Dennis [name] is a 71 y.o. male who has a televisit for c/o nasal congestion, postnasal drip and cough for the last 5 days. He recently completed completed Paxlovid for COVID infection and was feeling better.  He started having runny nose, sore throat and cough later.  He denies any fever, chills, dyspnea or wheezing currently. ? ? ?The patient does not have symptoms concerning for COVID-19 infection (fever, chills, cough, or new shortness of breath).  ? ?Past Medical, Surgical, Social History, Allergies, and Medications have been Reviewed. ? ?Past Medical History:  ?Diagnosis Date  ? Atrial flutter ([place])   ? Diagnosed by ECG August 2015  ? Colitis   ? COPD (chronic  obstructive pulmonary disease) ([place])   ? Coronary atherosclerosis of native coronary artery   ? Multivessel status post CABG  ? Essential hypertension   ? Hyperlipidemia   ? Insomnia   ? ?Past Surgical History:  ?Procedure Laterality Date  ? COLONOSCOPY  01/29/2005  ? Dr. Gala Romney; rectal polyp s/p polypectomy, otherwise normal.  Pathology with tubulovillous adenoma.  ? COLONOSCOPY  02/06/2008  ? Dr. Gala Romney; minimal internal hemorrhoids, diminutive polyp in the splenic flexure s/p cold biopsy removal, otherwise normal.  Pathology with benign polypoid colonic mucosa.  ? COLONOSCOPY WITH PROPOFOL N/A 08/30/2015  ? Procedure: COLONOSCOPY WITH PROPOFOL at cecum at 0814; withdrawal time=8 minutes;  Surgeon: Daneil Dolin, MD; internal hemorrhoids-likely source of hematochezia, otherwise normal exam.  Repeat in 5 years.  ? COLONOSCOPY WITH PROPOFOL N/A 01/28/2021  ? Procedure: COLONOSCOPY WITH PROPOFOL;  Surgeon: Daneil Dolin, MD;  [facility];  Service: Endoscopy;  Laterality: N/A;  am appt  ? CORONARY ARTERY BYPASS GRAFT  2005  ? Dr. Lucianne Lei Trigt: LIMA to LAD, right radial to circumflex, SVG to RCA  ? CORONARY ARTERY BYPASS GRAFT  10/16/2004  ? ELECTROPHYSIOLOGIC STUDY N/A 11/21/2016  ? Procedure: A-Flutter Ablation;  Surgeon: Evans Lance, MD;  Location: [facility];  Service: Cardiovascular;  Laterality: N/A;  ? TEE WITHOUT CARDIOVERSION N/A 11/21/2016  ? Procedure: TRANSESOPHAGEAL ECHOCARDIOGRAM (TEE);  Surgeon: Sanda Klein, MD;  Location: [facility];  Service: Cardiovascular;  Laterality: N/A;  ?  ? ?Current Meds  ?Medication Sig  ? albuterol (PROVENTIL) (2.5 MG/3ML) 0.083% nebulizer solution Take 3 mLs (  2.5 mg total) by nebulization every 6 (six) hours as needed for wheezing or shortness of breath.  ? albuterol (VENTOLIN HFA) 108 (90 Base) MCG/ACT inhaler Inhale 2 puffs into the lungs every 4 (four) hours as needed for wheezing.  ? ALPRAZolam (XANAX) 0.5 MG tablet TAKE 0.5 TABLETS BY MOUTH 2  TIMES DAILY AS NEEDED FOR ANXIETY.  ? amoxicillin-clavulanate (AUGMENTIN) 875-125 MG tablet Take 1 tablet by mouth 2 (two) times daily.  ? aspirin EC 81 MG tablet Take 1 tablet (81 mg total) by mouth daily. (Patient taking differently: Take 81 mg by mouth 2 (two) times a week.)  ? budesonide-formoterol (SYMBICORT) 160-4.5 MCG/ACT inhaler TAKE 2 PUFFS FIRST THING IN MORNING AND THEN ANOTHER 2 PUFFS ABOUT 12 HOURS LATER.  ? ezetimibe-simvastatin (VYTORIN) 10-40 MG tablet TAKE 1 TABLET BY MOUTH AT BEDTIME. TAKE 1 TABLET BY MOUTH EVERYDAY AT BEDTIME  ? fenofibrate 160 MG tablet TAKE 1 TABLET BY MOUTH EVERY DAY  ? fish oil-omega-3 fatty acids 1000 MG capsule Take 1 g by mouth in the morning and at bedtime.  ? guaiFENesin (MUCINEX) 600 MG 12 hr tablet Take 1 tablet (600 mg total) by mouth 2 (two) times daily.  ? ketoconazole (NIZORAL) 2 % cream Apply 1 application topically daily.  ? losartan-hydrochlorothiazide (HYZAAR) 100-25 MG tablet TAKE 1 TABLET BY MOUTH EVERY DAY  ? methocarbamol (ROBAXIN) 500 MG tablet TAKE 1 TABLET BY MOUTH EVERY 8 HOURS AS NEEDED FOR MUSCLE SPASMS.  ? metoprolol tartrate (LOPRESSOR) 50 MG tablet TAKE 1 TABLET BY MOUTH TWICE A DAY  ? mupirocin ointment (BACTROBAN) 2 % Apply 1 application topically 2 (two) times daily. (Patient taking differently: Apply 1 application. topically 2 (two) times daily as needed (wound care).)  ? nitroGLYCERIN (NITROSTAT) 0.4 MG SL tablet Place 1 tablet (0.4 mg total) under the tongue every 5 (five) minutes as needed.  ? nystatin (MYCOSTATIN/NYSTOP) powder Apply 1 application topically 3 (three) times daily.  ? Respiratory Therapy Supplies (NEBULIZER/TUBING/MOUTHPIECE) KIT Nebulizer tubing and mouthpiece  ? tamsulosin (FLOMAX) 0.4 MG CAPS capsule TAKE 1 CAPSULE BY MOUTH EVERY DAY  ? Vitamin D, Ergocalciferol, (DRISDOL) 1.25 MG (50000 UNIT) CAPS capsule Take 1 capsule (50,000 Units total) by mouth every 7 (seven) days.  ? zolpidem (AMBIEN) 10 MG tablet TAKE 1 TABLET BY  MOUTH EVERY DAY AT BEDTIME AS NEEDED FOR SLEEP  ?  ? ?Allergies:   Ace inhibitors, Enalapril, Dexamethasone, Alirocumab, Clotrimazole-betamethasone, and Prednisone  ? ?ROS:   ?Please see the history of present illness.    ? ?All other systems reviewed and are negative. ? ? ?Labs/Other Tests and Data Reviewed:   ? ?Recent Labs: ?07/03/2021: TSH 3.620 ?02/26/2022: ALT 17; BUN 18; Creatinine, Ser 1.41; Hemoglobin 13.9; Platelets 150; Potassium 4.5; Sodium 138  ? ?Recent Lipid Panel ?Lab Results  ?Component Value Date/Time  ? CHOL 232 (H) 07/03/2021 08:20 AM  ? TRIG 146 07/03/2021 08:20 AM  ? HDL 43 07/03/2021 08:20 AM  ? CHOLHDL 5.4 (H) 07/03/2021 08:20 AM  ? CHOLHDL 9.4 (H) 01/11/2018 08:33 AM  ? LDLCALC 162 (H) 07/03/2021 08:20 AM  ? [place]  01/11/2018 08:33 AM  ?   Comment:  ?   . ?LDL cholesterol not calculated. Triglyceride levels ?greater than 400 mg/dL invalidate calculated LDL results. ?. ?Reference range: <100 ?Marland Kitchen ?Desirable range <100 mg/dL for primary prevention;   ?<70 mg/dL for patients with CHD or diabetic patients  ?with > or = 2 CHD risk factors. ?. ?LDL-C is now calculated using the  Martin-Hopkins  ?calculation, which is a validated novel method providing  ?better accuracy than the Friedewald equation in the  ?estimation of LDL-C.  ?Cresenciano Genre et al. Annamaria Helling. 4462;863(81): 2061-2068  ?(http://education.[facility].com/faq/FAQ164) ?  ? ? ?Wt Readings from Last 3 Encounters:  ?03/13/22 221 lb 6.4 oz (100.4 kg)  ?01/06/22 220 lb 1.9 oz (99.8 kg)  ?11/12/21 212 lb 1.3 oz (96.2 kg)  ?  ? ?ASSESSMENT & PLAN:   ? ?Acute sinusitis ?Started empiric Augmentin as he has persistent symptoms -could be superimposed bacterial infection ?Nasal saline spray as needed for nasal congestion ?Advised to use humidifier and/or vaporizer ?Mucinex as needed for cough ? ?Time:   ?Today, I have spent 9 minutes reviewing the chart, including problem list, medications, and with the patient with telehealth technology discussing the  above problems. ? ? ?Medication Adjustments/Labs and Tests Ordered: ?Current medicines are reviewed at length with the patient today.  Concerns regarding medicines are outlined above.  ? ?Tests Ordered: ?No orders

## 2022-04-12 ENCOUNTER — Other Ambulatory Visit: Payer: Self-pay | Admitting: Internal Medicine

## 2022-04-12 DIAGNOSIS — I10 Essential (primary) hypertension: Secondary | ICD-10-CM

## 2022-05-28 ENCOUNTER — Telehealth: Payer: Self-pay | Admitting: Cardiology

## 2022-05-28 NOTE — Telephone Encounter (Signed)
Pt is requesting a provider switch from Dr. Domenic Polite to Dr. Lovena Le. Informed the pt that Dr. Lovena Le specializes in EP but he wanted me to ask anyway.

## 2022-05-30 ENCOUNTER — Telehealth: Payer: Self-pay

## 2022-05-30 NOTE — Telephone Encounter (Signed)
Patient spouse called asking if he can be referred out to another cardiologist other than Dr Domenic Polite not satisfied  with the provider. Call back # [phone].

## 2022-06-01 NOTE — Telephone Encounter (Signed)
I agree with Dr. Domenic Polite.

## 2022-06-02 NOTE — Telephone Encounter (Signed)
Spoke with pt he is going to stay with [facility] he would like vit d checked when he comes for lab work he would like to have this drawn in the next two weeks with cholesterol check please advise

## 2022-06-09 ENCOUNTER — Ambulatory Visit (INDEPENDENT_AMBULATORY_CARE_PROVIDER_SITE_OTHER): Payer: Medicare Other | Admitting: Internal Medicine

## 2022-06-09 ENCOUNTER — Encounter: Payer: Self-pay | Admitting: Internal Medicine

## 2022-06-09 ENCOUNTER — Other Ambulatory Visit: Payer: Self-pay | Admitting: Cardiology

## 2022-06-09 ENCOUNTER — Telehealth: Payer: Self-pay | Admitting: Internal Medicine

## 2022-06-09 VITALS — BP 138/68 | HR 83 | Ht 73.0 in | Wt 217.2 lb

## 2022-06-09 DIAGNOSIS — I10 Essential (primary) hypertension: Secondary | ICD-10-CM | POA: Diagnosis not present

## 2022-06-09 DIAGNOSIS — N401 Enlarged prostate with lower urinary tract symptoms: Secondary | ICD-10-CM | POA: Diagnosis not present

## 2022-06-09 DIAGNOSIS — I739 Peripheral vascular disease, unspecified: Secondary | ICD-10-CM

## 2022-06-09 DIAGNOSIS — F419 Anxiety disorder, unspecified: Secondary | ICD-10-CM

## 2022-06-09 DIAGNOSIS — R350 Frequency of micturition: Secondary | ICD-10-CM

## 2022-06-09 LAB — POCT URINALYSIS DIP (CLINITEK)
Bilirubin, UA: NEGATIVE
Blood, UA: NEGATIVE
Glucose, UA: NEGATIVE mg/dL
Ketones, POC UA: NEGATIVE mg/dL
Leukocytes, UA: NEGATIVE
Nitrite, UA: NEGATIVE
POC PROTEIN,UA: NEGATIVE
Spec Grav, UA: 1.015 (ref 1.010–1.025)
Urobilinogen, UA: 1 E.U./dL
pH, UA: 7 (ref 5.0–8.0)

## 2022-06-09 MED ORDER — ALPRAZOLAM 0.5 MG PO TABS: 0.5000 mg | ORAL_TABLET | Freq: Two times a day (BID) | ORAL | 1 refills | Status: DC | PRN

## 2022-06-09 MED ORDER — TAMSULOSIN HCL 0.4 MG PO CAPS: 0.4000 mg | ORAL_CAPSULE | Freq: Every day | ORAL | 3 refills | Status: DC

## 2022-06-09 NOTE — Telephone Encounter (Signed)
Will need ov with me or NP before month is out with formulary info in hand or pharmacy review 1st but either way will need list of cheaper alternatives

## 2022-06-09 NOTE — Patient Instructions (Signed)
Please start taking Tamsulosin for BPH.  Please continue taking other medications as prescribed.  You are being scheduled to get [place] of arteries.

## 2022-06-09 NOTE — Telephone Encounter (Signed)
Called and spoke with pt letting him know that we would need to get him in for a consult due to last OV being 02/2016. Pt verbalized understanding. Appt scheduled for pt with MW 8/8. Nothing further needed.

## 2022-06-09 NOTE — Telephone Encounter (Signed)
Patient is unable to get patient assistance for Symbicort anymore due to changes effective August 1st. He states his out of pocket cost is over $300.   Sir do you want to change his Symbicort to another therapy.   Please advise

## 2022-06-11 NOTE — Assessment & Plan Note (Signed)
BP Readings from Last 1 Encounters:  06/09/22 138/68   Well-controlled with Hyzaar and Metoprolol Counseled for compliance with the medications Advised DASH diet and moderate exercise/walking, at least 150 mins/week

## 2022-06-11 NOTE — Assessment & Plan Note (Signed)
UA reviewed, no current concern for UTI He is nocturia, urinary hesitancy and incomplete bladder emptying likely due to BPH  Needs to continue Flomax

## 2022-06-11 NOTE — Assessment & Plan Note (Signed)
Has leg claudication symptoms Was on aspirin in the past, but had recurrent GI bleeding with it On Vytorin currently [place] ABI in 2015 unremarkable Prefers to wait for vascular surgery referral for now Considering his history of CAD and current leg claudication symptoms, will get [place] ABI

## 2022-06-11 NOTE — Progress Notes (Signed)
Established Patient Office Visit  Subjective:  Patient ID: Dennis [name], male    DOB: 07/07/1951  Age: 71 y.o. MRN: [id number]  CC:  Chief Complaint  Patient presents with   Prostate Check    Problems for about 3-4 weeks, night time urinating increased for about a month, little pain in the front left side that comes and goes     HPI [name] is a 71 y.o. male with past medical history of CAD s/p CABG, HTN, HLD, COPD, anxiety and insomnia who presents for f/u of his chronic medical conditions.  He complains of nocturia and feeling of bladder fullness.  Of note, he was prescribed Flomax for BPH in the past, but he did not continue taking it.  He denies any fever, chills, dysuria or hematuria currently.  Denies any flank or pelvic pain.  HTN and CAD: BP is well-controlled. Takes medications regularly. Patient denies headache, dizziness, chest pain, dyspnea or palpitations.  He complains of leg pain, worse with walking.  He denies any cold sensation or pallor of the feet.  He has had [place] ABI of LE in the past, which was unremarkable.  Anxiety/insomnia/PTSD: Currently well controlled with Xanax.  He gets intermittent episodes of severe anxiety spells, where he gets flushed and has mild dyspnea, but they have been less frequent recently.  He also takes Ambien for insomnia.   Past Medical History:  Diagnosis Date   Atrial flutter ([place])    Diagnosed by ECG August 2015   Colitis    COPD (chronic obstructive pulmonary disease) ([place])    Coronary atherosclerosis of native coronary artery    Multivessel status post CABG   Essential hypertension    Hyperlipidemia    Insomnia     Past Surgical History:  Procedure Laterality Date   COLONOSCOPY  01/29/2005   Dr. Gala Romney; rectal polyp s/p polypectomy, otherwise normal.  Pathology with tubulovillous adenoma.   COLONOSCOPY  02/06/2008   Dr. Gala Romney; minimal internal hemorrhoids, diminutive polyp in the splenic flexure s/p cold biopsy  removal, otherwise normal.  Pathology with benign polypoid colonic mucosa.   COLONOSCOPY WITH PROPOFOL N/A 08/30/2015   Procedure: COLONOSCOPY WITH PROPOFOL at cecum at 0814; withdrawal time=8 minutes;  Surgeon: Daneil Dolin, MD; internal hemorrhoids-likely source of hematochezia, otherwise normal exam.  Repeat in 5 years.   COLONOSCOPY WITH PROPOFOL N/A 01/28/2021   Procedure: COLONOSCOPY WITH PROPOFOL;  Surgeon: Daneil Dolin, MD;  Location: [facility];  Service: Endoscopy;  Laterality: N/A;  am appt   CORONARY ARTERY BYPASS GRAFT  2005   Dr. Lucianne Lei Trigt: LIMA to LAD, right radial to circumflex, SVG to RCA   CORONARY ARTERY BYPASS GRAFT  10/16/2004   ELECTROPHYSIOLOGIC STUDY N/A 11/21/2016   Procedure: A-Flutter Ablation;  Surgeon: Evans Lance, MD;  Location: [place] [facility];  Service: Cardiovascular;  Laterality: N/A;   TEE WITHOUT CARDIOVERSION N/A 11/21/2016   Procedure: TRANSESOPHAGEAL ECHOCARDIOGRAM (TEE);  Surgeon: Sanda Klein, MD;  Location: [facility] ENDOSCOPY;  Service: Cardiovascular;  Laterality: N/A;    Family History  Problem Relation Age of Onset   Hypertension Mother    Alcohol abuse Mother    Alcohol abuse Father    Colon cancer Neg Hx     Social History   Socioeconomic History   Marital status: Married    Spouse name: Not on file   Number of children: 3   Years of education: Not on file   Highest education level: Not on  file  Occupational History   Not on file  Tobacco Use   Smoking status: Former    Packs/day: 2.00    Years: 27.00    Total pack years: 54.00    Types: Cigarettes    Start date: 02/01/1971    Quit date: 01/31/1998    Years since quitting: 24.3   Smokeless tobacco: Current    Types: Chew  Vaping Use   Vaping Use: Never used  Substance and Sexual Activity   Alcohol use: Yes    Alcohol/week: 0.0 standard drinks of alcohol    Comment: Drinks beer daily, typically 2-4 a day. Also 4 oz red wine an evening.   Drug use: No   Sexual  activity: Not on file  Other Topics Concern   Not on file  Social History Narrative   Lives with wife, Anderson Malta   Social Determinants of Health   Financial Resource Strain: Medium Risk (08/05/2021)   Overall Financial Resource Strain (CARDIA)    Difficulty of Paying Living Expenses: Somewhat hard  Food Insecurity: No Food Insecurity (08/05/2021)   Hunger Vital Sign    Worried About Running Out of Food in the Last Year: Never true    [place] in the Last Year: Never true  Transportation Needs: No Transportation Needs (08/05/2021)   PRAPARE - Hydrologist (Medical): No    Lack of Transportation (Non-Medical): No  Physical Activity: Insufficiently Active (08/05/2021)   Exercise Vital Sign    Days of Exercise per Week: 3 days    Minutes of Exercise per Session: 30 min  Stress: No Stress Concern Present (08/05/2021)   [place]    Feeling of Stress : Only a little  Social Connections: Moderately Isolated (08/05/2021)   Social Connection and Isolation Panel [NHANES]    Frequency of Communication with Friends and Family: More than three times a week    Frequency of Social Gatherings with Friends and Family: More than three times a week    Attends Religious Services: Never    Marine scientist or Organizations: No    Attends Archivist Meetings: Never    Marital Status: Married  Human resources officer Violence: Not At Risk (08/05/2021)   Humiliation, Afraid, Rape, and Kick questionnaire    Fear of Current or Ex-Partner: No    Emotionally Abused: No    Physically Abused: No    Sexually Abused: No    Outpatient Medications Prior to Visit  Medication Sig Dispense Refill   albuterol (PROVENTIL) (2.5 MG/3ML) 0.083% nebulizer solution Take 3 mLs (2.5 mg total) by nebulization every 6 (six) hours as needed for wheezing or shortness of breath. 150 mL 3   albuterol (VENTOLIN HFA) 108  (90 Base) MCG/ACT inhaler Inhale 2 puffs into the lungs every 4 (four) hours as needed for wheezing. 18 g 5   aspirin EC 81 MG tablet Take 1 tablet (81 mg total) by mouth daily. (Patient taking differently: Take 81 mg by mouth 2 (two) times a week.) 30 tablet 11   budesonide-formoterol (SYMBICORT) 160-4.5 MCG/ACT inhaler TAKE 2 PUFFS FIRST THING IN MORNING AND THEN ANOTHER 2 PUFFS ABOUT 12 HOURS LATER. 30.6 each 1   ezetimibe-simvastatin (VYTORIN) 10-40 MG tablet TAKE 1 TABLET BY MOUTH AT BEDTIME. TAKE 1 TABLET BY MOUTH EVERYDAY AT BEDTIME 90 tablet 1   fenofibrate 160 MG tablet TAKE 1 TABLET BY MOUTH EVERY DAY 90 tablet 1  fish oil-omega-3 fatty acids 1000 MG capsule Take 1 g by mouth in the morning and at bedtime.     guaiFENesin (MUCINEX) 600 MG 12 hr tablet Take 1 tablet (600 mg total) by mouth 2 (two) times daily. 20 tablet 0   losartan-hydrochlorothiazide (HYZAAR) 100-25 MG tablet TAKE 1 TABLET BY MOUTH EVERY DAY 90 tablet 0   metoprolol tartrate (LOPRESSOR) 50 MG tablet TAKE 1 TABLET BY MOUTH TWICE A DAY 180 tablet 1   mupirocin ointment (BACTROBAN) 2 % Apply 1 application topically 2 (two) times daily. (Patient taking differently: Apply 1 application  topically 2 (two) times daily as needed (wound care).) 22 g 0   Respiratory Therapy Supplies (NEBULIZER/TUBING/MOUTHPIECE) KIT Nebulizer tubing and mouthpiece 1 kit 0   Vitamin D, Ergocalciferol, (DRISDOL) 1.25 MG (50000 UNIT) CAPS capsule Take 1 capsule (50,000 Units total) by mouth every 7 (seven) days. 12 capsule 1   zolpidem (AMBIEN) 10 MG tablet TAKE 1 TABLET BY MOUTH EVERY DAY AT BEDTIME AS NEEDED FOR SLEEP 30 tablet 3   ALPRAZolam (XANAX) 0.5 MG tablet TAKE 0.5 TABLETS BY MOUTH 2 TIMES DAILY AS NEEDED FOR ANXIETY. 30 tablet 1   nitroGLYCERIN (NITROSTAT) 0.4 MG SL tablet Place 1 tablet (0.4 mg total) under the tongue every 5 (five) minutes as needed. 25 tablet 3   amoxicillin-clavulanate (AUGMENTIN) 875-125 MG tablet Take 1 tablet by mouth  2 (two) times daily. 14 tablet 0   ketoconazole (NIZORAL) 2 % cream Apply 1 application topically daily.     methocarbamol (ROBAXIN) 500 MG tablet TAKE 1 TABLET BY MOUTH EVERY 8 HOURS AS NEEDED FOR MUSCLE SPASMS. 30 tablet 0   nystatin (MYCOSTATIN/NYSTOP) powder Apply 1 application topically 3 (three) times daily. 15 g 0   tamsulosin (FLOMAX) 0.4 MG CAPS capsule TAKE 1 CAPSULE BY MOUTH EVERY DAY 90 capsule 0   No facility-administered medications prior to visit.    Allergies  Allergen Reactions   Ace Inhibitors Shortness Of Breath and Cough   Enalapril Cough    Pt has been switched to Valsartan and is tolerating the different class.   Dexamethasone Itching    Pt was prescribed this in december   Alirocumab Rash and Other (See Comments)    blisters   Clotrimazole-Betamethasone Rash   Prednisone Rash    ROS Review of Systems  Constitutional:  Negative for chills and fever.  HENT:  Negative for congestion and sore throat.   Eyes:  Negative for pain and discharge.  Respiratory:  Negative for cough and shortness of breath.   Cardiovascular:  Negative for chest pain and palpitations.  Gastrointestinal:  Negative for diarrhea, nausea and vomiting.  Endocrine: Negative for polydipsia and polyuria.  Genitourinary:  Negative for dysuria, flank pain and hematuria.       Nocturia  Musculoskeletal:  Negative for neck pain and neck stiffness.       Leg pain  Skin:  Negative for rash.  Neurological:  Negative for dizziness, weakness, numbness and headaches.  Psychiatric/Behavioral:  Positive for sleep disturbance. Negative for agitation, behavioral problems, dysphoric mood and suicidal ideas. The patient is nervous/anxious.       Objective:    Physical Exam Vitals reviewed.  Constitutional:      General: He is not in acute distress.    Appearance: He is not diaphoretic.  HENT:     Head: Normocephalic and atraumatic.     Nose: Nose normal.     Mouth/Throat:     Mouth: Mucous  membranes  are moist.  Eyes:     General: No scleral icterus.    Extraocular Movements: Extraocular movements intact.  Cardiovascular:     Rate and Rhythm: Normal rate and regular rhythm.     Pulses: Normal pulses.     Heart sounds: Normal heart sounds. No murmur heard. Pulmonary:     Breath sounds: Normal breath sounds. No wheezing or rales.  Abdominal:     Palpations: Abdomen is soft.     Tenderness: There is no abdominal tenderness.  Musculoskeletal:     Cervical back: Neck supple. No tenderness.     Right lower leg: No edema.     Left lower leg: No edema.  Skin:    General: Skin is warm.     Findings: No rash.  Neurological:     General: No focal deficit present.     Mental Status: He is alert and oriented to person, place, and time.     Sensory: No sensory deficit.     Motor: No weakness.  Psychiatric:        Mood and Affect: Mood normal.        Behavior: Behavior normal.     BP 138/68   Pulse 83   Ht _0  (1.854 m)   Wt 217 lb 3.2 oz (98.5 kg)   SpO2 95%   BMI 28.66 kg/m  Wt Readings from Last 3 Encounters:  06/09/22 217 lb 3.2 oz (98.5 kg)  03/13/22 221 lb 6.4 oz (100.4 kg)  01/06/22 220 lb 1.9 oz (99.8 kg)    Lab Results  Component Value Date   TSH 3.620 07/03/2021   Lab Results  Component Value Date   WBC 6.1 02/26/2022   HGB 13.9 02/26/2022   HCT 43.0 02/26/2022   MCV 93 02/26/2022   PLT 150 02/26/2022   Lab Results  Component Value Date   NA 138 02/26/2022   K 4.5 02/26/2022   CO2 27 02/26/2022   GLUCOSE 90 02/26/2022   BUN 18 02/26/2022   CREATININE 1.41 (H) 02/26/2022   BILITOT 0.5 02/26/2022   ALKPHOS 24 (L) 02/26/2022   AST 27 02/26/2022   ALT 17 02/26/2022   PROT 6.2 02/26/2022   ALBUMIN 4.3 02/26/2022   CALCIUM 9.7 02/26/2022   ANIONGAP 6 03/25/2020   EGFR 54 (L) 02/26/2022   GFR 60.41 08/13/2015   Lab Results  Component Value Date   CHOL 232 (H) 07/03/2021   Lab Results  Component Value Date   HDL 43 07/03/2021   Lab  Results  Component Value Date   LDLCALC 162 (H) 07/03/2021   Lab Results  Component Value Date   TRIG 146 07/03/2021   Lab Results  Component Value Date   CHOLHDL 5.4 (H) 07/03/2021   Lab Results  Component Value Date   HGBA1C 5.9 (H) 02/26/2022      Assessment & Plan:   Problem List Items Addressed This Visit       Cardiovascular and Mediastinum   Essential hypertension, benign    BP Readings from Last 1 Encounters:  06/09/22 138/68  Well-controlled with Hyzaar and Metoprolol Counseled for compliance with the medications Advised DASH diet and moderate exercise/walking, at least 150 mins/week      PVD (peripheral vascular disease) with claudication ([place])    Has leg claudication symptoms Was on aspirin in the past, but had recurrent GI bleeding with it On Vytorin currently [place] ABI in 2015 unremarkable Prefers to wait for vascular surgery referral for now Considering  his history of CAD and current leg claudication symptoms, will get [place] ABI      Relevant Orders   US ARTERIAL ABI (SCREENING LOWER EXTREMITY)     Other   Anxiety    Takes Xanax PRN, for panic episodes Associated with previous incident with his wife (cardiac arrest) Symptoms better now Takes Ambien 10 mg QD for insomnia      Relevant Medications   ALPRAZolam (XANAX) 0.5 MG tablet   Benign prostatic hyperplasia with urinary frequency - Primary    UA reviewed, no current concern for UTI He is nocturia, urinary hesitancy and incomplete bladder emptying likely due to BPH  Needs to continue Flomax      Relevant Medications   tamsulosin (FLOMAX) 0.4 MG CAPS capsule   Other Relevant Orders   POCT URINALYSIS DIP (CLINITEK) (Completed)    Meds ordered this encounter  Medications   tamsulosin (FLOMAX) 0.4 MG CAPS capsule    Sig: Take 1 capsule (0.4 mg total) by mouth daily.    Dispense:  90 capsule    Refill:  3   ALPRAZolam (XANAX) 0.5 MG tablet    Sig: Take 1 tablet (0.5 mg total) by mouth 2  (two) times daily as needed for anxiety.    Dispense:  30 tablet    Refill:  1    Follow-up: Return in about 4 months (around 10/10/2022).    Lindell Spar, MD

## 2022-06-11 NOTE — Assessment & Plan Note (Signed)
Takes Xanax PRN, for panic episodes Associated with previous incident with his wife (cardiac arrest) Symptoms better now Takes Ambien 10 mg QD for insomnia 

## 2022-06-12 ENCOUNTER — Ambulatory Visit (HOSPITAL_COMMUNITY)
Admission: RE | Admit: 2022-06-12 | Discharge: 2022-06-12 | Disposition: A | Discharge status: Home / Self Care | Payer: Medicare Other | Source: Ambulatory Visit | Attending: Internal Medicine | Admitting: Internal Medicine

## 2022-06-12 DIAGNOSIS — R200 Anesthesia of skin: Secondary | ICD-10-CM | POA: Diagnosis not present

## 2022-06-12 DIAGNOSIS — I739 Peripheral vascular disease, unspecified: Secondary | ICD-10-CM | POA: Diagnosis not present

## 2022-06-13 ENCOUNTER — Encounter: Payer: Self-pay | Admitting: Internal Medicine

## 2022-06-13 ENCOUNTER — Telehealth: Payer: Self-pay | Admitting: Internal Medicine

## 2022-06-13 ENCOUNTER — Ambulatory Visit (HOSPITAL_COMMUNITY)
Admission: RE | Admit: 2022-06-13 | Discharge: 2022-06-13 | Disposition: A | Discharge status: Home / Self Care | Payer: Medicare Other | Source: Ambulatory Visit | Attending: Internal Medicine | Admitting: Internal Medicine

## 2022-06-13 ENCOUNTER — Ambulatory Visit (INDEPENDENT_AMBULATORY_CARE_PROVIDER_SITE_OTHER): Payer: Medicare Other | Admitting: Internal Medicine

## 2022-06-13 DIAGNOSIS — J449 Chronic obstructive pulmonary disease, unspecified: Secondary | ICD-10-CM | POA: Insufficient documentation

## 2022-06-13 DIAGNOSIS — I2510 Atherosclerotic heart disease of native coronary artery without angina pectoris: Secondary | ICD-10-CM

## 2022-06-13 DIAGNOSIS — R0602 Shortness of breath: Secondary | ICD-10-CM | POA: Diagnosis not present

## 2022-06-13 NOTE — Patient Instructions (Addendum)
Symbicort 160 (dulera 200)  1-2 every 12 hours as needed   Only use your albuterol as a rescue medication to be used if you can't catch your breath by resting or doing a relaxed purse lip breathing pattern.  - The less you use it, the better it will work when you need it. - Ok to use up to 2 puffs  every 4 hours if you must but call for immediate appointment if use goes up over your usual need - Don't leave home without it !!  (think of it like the spare tire for your car)   My office will be contacting you by phone for referral to pulmonary rehab at [facility]   Please remember to go to the  x-ray department  @  [facility] for your tests - we will call you with the results when they are available      Please schedule a follow up visit in 6  months but call sooner if needed

## 2022-06-13 NOTE — Progress Notes (Signed)
Subjective:    Patient ID: Dennis [name], male    DOB: 01-12-1951,  MRN: [id number]    Brief patient profile:  8 yowm quit smoking around 1999 cough> sob both better then sev years later worse breathing/fatigue > 20005 much better and able to do gym p rehab including aerobics and miniimal respiratory symptoms / rare need for saba at wt around 210 and gradually downhill since then to the point to where out of breath house to mailbox slt incline x 100 ft assoc with audible wheeze   History of Present Illness  08/13/2015 1st [facility] office visit/ Stacia Feazell   Chief Complaint  Patient presents with   Pulmonary Consult    Referred by Dr. [name] for eval of COPD. He states dxed with COPD in 2006. He c/o DOE with minimal exertion such as walking to the mailbox. 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 He has occ cough- prod with clear sputum.  He is using albuterol 1-3 x per day on average.   cough/ sob worse at work, better at home unless go out in heat  - wife hears noisy breathing and can't get to mb and back s stopping to rest. Prev eval by [facility] for same and told he had mild copd and downhill since then despite no smoking in interim rec Stop vasotec  And start avapro 160 mg daily  Pepcid ac 20 mg one at bedtime  GERD diet  Strongly recommend you discuss the statin issue directly with [name]> never did, doesn't see [name] it turns out        02/28/2016  f/u ov/Hortencia Martire re:   GOLD 3copd/ symbicort/ not on spiriva / unable to read dial of symbicort correctly  Chief Complaint  Patient presents with   Follow-up    Breathing has improved "as long as I stay around clean air". Energy level has improved.   mailbox and back is ok/ joined Y/ treadmill x one mile flat in 20 min s stopping  Main cc is keep getting bad colds > cough p exp to sick grandchildren/ has dulera empty and says has been using both symb 160 and dulera  Rec Plan A = Automatic = Symbicort  160 Take 2 puffs first thing in am and then another 2 puffs about 12 hours later  Plan B = Backup Only use your albuterol as a rescue medication  Keep up the walking on the treadmill and let me know if losing ground   In event of any cough>  Try prilosec otc 72m  Take 30-60 min before first meal of the day and Pepcid ac (famotidine) 20 mg one @  bedtime until cough is completely gone for at least a week without the need for cough suppression Best cough medication is deslym which is over the counter or mucinex dm 1200 mg every 12 hours as needed If not better w/in 2 weeks return with all medications in hand    06/13/2022 Re-established ov/[place] office/Samiyyah Moffa re: copd 3 AB pattern maint on symb 160 2bid prn   Chief Complaint  Patient presents with   Consult    Here to re establish care with Dr. WMelvyn Novas     Wants to discuss inhalers   Dyspnea:  limited by both legs/claudicatoin - can do [employer]/ [employer] ok leaning on buggy  / uses [employer] parkng  Cough: none  Sleeping: bed is flat/ fetal position / one pillow - No am HA and  typically sats are 90% SABA use: not using  02: not using  Covid status: vax x 3 and infected Feb 2023    No obvious day to day or daytime variability or assoc excess/ purulent sputum or mucus plugs or hemoptysis or cp or chest tightness, subjective wheeze or overt sinus or hb symptoms.   Sleeping  without nocturnal  or early am exacerbation  of respiratory  c/o's or need for noct saba. Also denies any obvious fluctuation of symptoms with weather or environmental changes or other aggravating or alleviating factors except as outlined above   No unusual exposure hx or h/o childhood pna/ asthma or knowledge of premature birth.  Current Allergies, Complete Past Medical History, Past Surgical History, Family History, and Social History were reviewed in [employer] record.  ROS  The following are not active complaints unless bolded Hoarseness, sore  throat, dysphagia, dental problems, itching, sneezing,  nasal congestion or discharge of excess mucus or purulent secretions, ear ache,   fever, chills, sweats, unintended wt loss or wt gain, classically pleuritic or exertional cp,  orthopnea pnd or arm/hand swelling  or leg swelling, presyncope, palpitations, abdominal pain, anorexia, nausea, vomiting, diarrhea  or change in bowel habits or change in bladder habits, change in stools or change in urine, dysuria, hematuria,  rash, arthralgias, visual complaints, headache, numbness, weakness or ataxia or problems with walking or coordination,  change in mood or  memory. claudication        Current Meds  Medication Sig   albuterol (PROVENTIL) (2.5 MG/3ML) 0.083% nebulizer solution Take 3 mLs (2.5 mg total) by nebulization every 6 (six) hours as needed for wheezing or shortness of breath.   albuterol (VENTOLIN HFA) 108 (90 Base) MCG/ACT inhaler Inhale 2 puffs into the lungs every 4 (four) hours as needed for wheezing.   ALPRAZolam (XANAX) 0.5 MG tablet Take 1 tablet (0.5 mg total) by mouth 2 (two) times daily as needed for anxiety.   aspirin EC 81 MG tablet Take 1 tablet (81 mg total) by mouth daily. (Patient taking differently: Take 81 mg by mouth 2 (two) times a week.)   budesonide-formoterol (SYMBICORT) 160-4.5 MCG/ACT inhaler TAKE 2 PUFFS FIRST THING IN MORNING AND THEN ANOTHER 2 PUFFS ABOUT 12 HOURS LATER.   ezetimibe-simvastatin (VYTORIN) 10-40 MG tablet TAKE 1 TABLET BY MOUTH AT BEDTIME. TAKE 1 TABLET BY MOUTH EVERYDAY AT BEDTIME   fenofibrate 160 MG tablet TAKE 1 TABLET BY MOUTH EVERY DAY   fish oil-omega-3 fatty acids 1000 MG capsule Take 1 g by mouth in the morning and at bedtime.   guaiFENesin (MUCINEX) 600 MG 12 hr tablet Take 1 tablet (600 mg total) by mouth 2 (two) times daily.   losartan-hydrochlorothiazide (HYZAAR) 100-25 MG tablet TAKE 1 TABLET BY MOUTH EVERY DAY   metoprolol tartrate (LOPRESSOR) 50 MG tablet TAKE 1 TABLET BY MOUTH TWICE  A DAY   mupirocin ointment (BACTROBAN) 2 % Apply 1 application topically 2 (two) times daily. (Patient taking differently: Apply 1 application  topically 2 (two) times daily as needed (wound care).)   nitroGLYCERIN (NITROSTAT) 0.4 MG SL tablet PLACE 1 TABLET (0.4 MG TOTAL) UNDER THE TONGUE EVERY 5 (FIVE) MINUTES AS NEEDED.   Respiratory Therapy Supplies (NEBULIZER/TUBING/MOUTHPIECE) KIT Nebulizer tubing and mouthpiece   tamsulosin (FLOMAX) 0.4 MG CAPS capsule Take 1 capsule (0.4 mg total) by mouth daily.   Vitamin D, Ergocalciferol, (DRISDOL) 1.25 MG (50000 UNIT) CAPS capsule Take 1 capsule (50,000 Units total) by mouth every 7 (  seven) days.   zolpidem (AMBIEN) 10 MG tablet TAKE 1 TABLET BY MOUTH EVERY DAY AT BEDTIME AS NEEDED FOR SLEEP            Objective:   Physical Exam     06/13/2022      218  10/25/2015      216   > 12/07/2015  221 > 02/28/2016   221     08/13/15 212 lb 6.4 oz (96.344 kg)  02/26/15 214 lb (97.07 kg)  02/01/15 214 lb 6.4 oz (97.251 kg)      Vital signs reviewed  06/13/2022  - Note at rest 02 sats  97% on RA   General appearance:    amb wf nad    HEENT : Oropharynx  clear  Nasal turbinates nl    NECK :  without  apparent JVD/ palpable Nodes/TM    LUNGS: no acc muscle use,  Min barrel  contour chest wall with bilateral  slightly decreased bs s audible wheeze and  without cough on insp or exp maneuvers and min  Hyperresonant  to  percussion bilaterally    CV:  RRR  no s3 or murmur or increase in P2, and no edema   ABD: obese  soft and nontender with pos end  insp Hoover's  in the supine position.  No bruits or organomegaly appreciated   MS:  Nl gait/ ext warm without deformities Or obvious joint restrictions  calf tenderness, cyanosis or clubbing     SKIN: warm and dry without lesions    NEURO:  alert, approp, nl sensorium with  no motor or cerebellar deficits apparent.         CXR PA and Lateral:   06/13/2022 :    I personally reviewed images and  impression is as follows:     Mild/mod copd / nothing acute       Assessment & Plan:

## 2022-06-13 NOTE — Telephone Encounter (Signed)
Patient LVM for call back with lab results

## 2022-06-13 NOTE — Assessment & Plan Note (Addendum)
Quit smoking 1999 - 08/13/2015  Walked RA x 3 laps @ 185 ft each stopped due to  End of study, nl pace, no sob or desat   - PFT's  10/01/2015  FEV1 1.91 (49 % ) ratio 51  p 29 % improvement from saba  With RV/TLC ratio 171% nl with DLCO  66 % corrects to 78 % for alv volume   - 10/01/2015    trial of symbicort 160 2 bid  - 10/25/2015    added spiriva respimat but pt stopped it  - 12/07/2015   Walked RA  2 laps @ 185 ft each stopped due to  Legs weak, min sob/ no desat at brisk pace > referred to rehab at [facility] - 02/28/2016 continue symbicort 160  - 06/13/2022  After extensive coaching inhaler device,  effectiveness =    90% ok to continue symbicort 160 up to  2 q 12 h prn as has found that works as well   - 06/13/2022  Referred for pulmonary rehab - 06/13/2022   Walked on RA  x  3  lap(s) =  approx 450   ft  @ mod pace, stopped due to end of study with lowest 02 sats 94% no sob but bilateral leg pain  He clinically is more AB than copd but can't take lama anyway due to bph and just started on flomax for this so rec symb 160 up  To 2 bid prn Based on two studies from [facility]  378; 20 p 1865 (2018) and 380 : p2020-30 (2019) in pts with mild asthma it is reasonable to use  symbicort (or dulera by inference)  "prn" flare in this setting but I emphasized this was only shown with formoterol use  and takes advantage of the rapid onset of action but is not the same as "rescue therapy" but can be stopped once the acute symptoms have resolved and the need for rescue has been minimized (< 2 x weekly)     F/u q 6 m approp at this point    Each maintenance medication was reviewed in detail including emphasizing most importantly the difference between maintenance and prns and under what circumstances the prns are to be triggered using an action plan format where appropriate.  Total time for H and P, chart review, counseling, reviewing device options , directly observing portions of ambulatory 02 saturation study/ and  generating customized AVS unique to this office visit / same day charting  > 30 min for pt not seen in over 6 y

## 2022-06-13 NOTE — Addendum Note (Signed)
Addended by: Fritzi Mandes D on: 06/13/2022 04:07 PM   Modules accepted: Orders

## 2022-06-16 ENCOUNTER — Telehealth: Payer: Self-pay | Admitting: Internal Medicine

## 2022-06-16 ENCOUNTER — Other Ambulatory Visit: Payer: Self-pay | Admitting: Internal Medicine

## 2022-06-16 DIAGNOSIS — J449 Chronic obstructive pulmonary disease, unspecified: Secondary | ICD-10-CM

## 2022-06-16 DIAGNOSIS — I739 Peripheral vascular disease, unspecified: Secondary | ICD-10-CM

## 2022-06-16 NOTE — Telephone Encounter (Signed)
Pt advised of [place] results

## 2022-06-16 NOTE — Telephone Encounter (Signed)
Patient called in regard to ultrasound results

## 2022-06-16 NOTE — Telephone Encounter (Signed)
Pt advised with [place] results

## 2022-06-16 NOTE — Telephone Encounter (Signed)
Jackelyn Poling stated that for the diagnosis of COPD for pt to have pulmonary rehab, Medicare requires a PFT within 3 years.  Pt's last PFT was 2016.  Please advise.   Incoming call     Dr. Melvyn Novas, please advise is it ok to order a pft for pt.

## 2022-06-16 NOTE — Telephone Encounter (Signed)
Called and spoke to [place] (ok per dpr) and scheduled patient for PFT this  Thursday in [place] office.  ATC Debbie to notify.epic message sent. Nothing further needed.

## 2022-06-16 NOTE — Telephone Encounter (Signed)
Yes do pft

## 2022-06-19 ENCOUNTER — Ambulatory Visit (INDEPENDENT_AMBULATORY_CARE_PROVIDER_SITE_OTHER): Payer: Medicare Other | Admitting: Internal Medicine

## 2022-06-19 DIAGNOSIS — J449 Chronic obstructive pulmonary disease, unspecified: Secondary | ICD-10-CM

## 2022-06-19 NOTE — Patient Instructions (Signed)
Full PFT performed today. °

## 2022-06-19 NOTE — Progress Notes (Signed)
Full PFT performed today. °

## 2022-06-26 LAB — PULMONARY FUNCTION TEST
DL/VA % pred: 75 %
DL/VA: 3.02 ml/min/mmHg/L
DLCO cor % pred: 64 %
DLCO cor: 18.19 ml/min/mmHg
DLCO unc % pred: 64 %
DLCO unc: 18.19 ml/min/mmHg
FEF 25-75 Post: 1.16 L/sec
FEF 25-75 Pre: 0.72 L/sec
FEF2575-%Change-Post: 61 %
FEF2575-%Pred-Post: 42 %
FEF2575-%Pred-Pre: 26 %
FEV1-%Change-Post: 34 %
FEV1-%Pred-Post: 49 %
FEV1-%Pred-Pre: 36 %
FEV1-Post: 1.79 L
FEV1-Pre: 1.33 L
FEV1FVC-%Change-Post: 10 %
FEV1FVC-%Pred-Pre: 63 %
FEV6-%Change-Post: 21 %
FEV6-%Pred-Post: 73 %
FEV6-%Pred-Pre: 60 %
FEV6-Post: 3.41 L
FEV6-Pre: 2.82 L
FEV6FVC-%Change-Post: 0 %
FEV6FVC-%Pred-Post: 104 %
FEV6FVC-%Pred-Pre: 105 %
FVC-%Change-Post: 22 %
FVC-%Pred-Post: 70 %
FVC-%Pred-Pre: 57 %
FVC-Post: 3.45 L
FVC-Pre: 2.83 L
Post FEV1/FVC ratio: 52 %
Post FEV6/FVC ratio: 99 %
Pre FEV1/FVC ratio: 47 %
Pre FEV6/FVC Ratio: 100 %
RV % pred: 213 %
RV: 5.48 L
TLC % pred: 129 %
TLC: 9.72 L

## 2022-07-01 ENCOUNTER — Encounter (HOSPITAL_COMMUNITY)
Admission: RE | Admit: 2022-07-01 | Discharge: 2022-07-01 | Disposition: A | Discharge status: Home / Self Care | Payer: Medicare Other | Source: Ambulatory Visit | Attending: Internal Medicine | Admitting: Internal Medicine

## 2022-07-01 ENCOUNTER — Institutional Professional Consult (permissible substitution): Payer: Medicare Other | Admitting: Internal Medicine

## 2022-07-01 ENCOUNTER — Encounter (HOSPITAL_COMMUNITY): Payer: Self-pay

## 2022-07-01 VITALS — BP 102/70 | HR 55 | Ht 73.0 in | Wt 219.4 lb

## 2022-07-01 DIAGNOSIS — J449 Chronic obstructive pulmonary disease, unspecified: Secondary | ICD-10-CM | POA: Diagnosis not present

## 2022-07-01 NOTE — Progress Notes (Signed)
Pulmonary Individual Treatment Plan  Patient Details  Name: Dennis [name] MRN: [id number] Date of Birth: January 22, 1951 Referring Provider:   Flowsheet Row PULMONARY REHAB COPD ORIENTATION from 07/01/2022 in [facility]  Referring Provider Dr. Melvyn Novas       Initial Encounter Date:  Flowsheet Row PULMONARY REHAB COPD ORIENTATION from 07/01/2022 in [place]  Date 07/01/22       Visit Diagnosis: COPD mixed type ([place])  Patient's Home Medications on Admission:   Current Outpatient Medications:    albuterol (PROVENTIL) (2.5 MG/3ML) 0.083% nebulizer solution, Take 3 mLs (2.5 mg total) by nebulization every 6 (six) hours as needed for wheezing or shortness of breath., Disp: 150 mL, Rfl: 3   albuterol (VENTOLIN HFA) 108 (90 Base) MCG/ACT inhaler, Inhale 2 puffs into the lungs every 4 (four) hours as needed for wheezing., Disp: 18 g, Rfl: 5   ALPRAZolam (XANAX) 0.5 MG tablet, Take 1 tablet (0.5 mg total) by mouth 2 (two) times daily as needed for anxiety., Disp: 30 tablet, Rfl: 1   aspirin EC 81 MG tablet, Take 1 tablet (81 mg total) by mouth daily. (Patient taking differently: Take 81 mg by mouth 2 (two) times a week.), Disp: 30 tablet, Rfl: 11   budesonide-formoterol (SYMBICORT) 160-4.5 MCG/ACT inhaler, TAKE 2 PUFFS FIRST THING IN MORNING AND THEN ANOTHER 2 PUFFS ABOUT 12 HOURS LATER., Disp: 30.6 each, Rfl: 1   ezetimibe-simvastatin (VYTORIN) 10-40 MG tablet, TAKE 1 TABLET BY MOUTH AT BEDTIME. TAKE 1 TABLET BY MOUTH EVERYDAY AT BEDTIME, Disp: 90 tablet, Rfl: 1   fenofibrate 160 MG tablet, TAKE 1 TABLET BY MOUTH EVERY DAY, Disp: 90 tablet, Rfl: 1   fish oil-omega-3 fatty acids 1000 MG capsule, Take 1 g by mouth in the morning and at bedtime., Disp: , Rfl:    guaiFENesin (MUCINEX) 600 MG 12 hr tablet, Take 1 tablet (600 mg total) by mouth 2 (two) times daily., Disp: 20 tablet, Rfl: 0   losartan-hydrochlorothiazide (HYZAAR) 100-25 MG tablet, TAKE 1 TABLET BY  MOUTH EVERY DAY, Disp: 90 tablet, Rfl: 0   metoprolol tartrate (LOPRESSOR) 50 MG tablet, TAKE 1 TABLET BY MOUTH TWICE A DAY, Disp: 180 tablet, Rfl: 1   mupirocin ointment (BACTROBAN) 2 %, Apply 1 application topically 2 (two) times daily. (Patient taking differently: Apply 1 application  topically 2 (two) times daily as needed (wound care).), Disp: 22 g, Rfl: 0   nitroGLYCERIN (NITROSTAT) 0.4 MG SL tablet, PLACE 1 TABLET (0.4 MG TOTAL) UNDER THE TONGUE EVERY 5 (FIVE) MINUTES AS NEEDED., Disp: 25 tablet, Rfl: 3   Respiratory Therapy Supplies (NEBULIZER/TUBING/MOUTHPIECE) KIT, Nebulizer tubing and mouthpiece, Disp: 1 kit, Rfl: 0   tamsulosin (FLOMAX) 0.4 MG CAPS capsule, Take 1 capsule (0.4 mg total) by mouth daily., Disp: 90 capsule, Rfl: 3   Vitamin D, Ergocalciferol, (DRISDOL) 1.25 MG (50000 UNIT) CAPS capsule, Take 1 capsule (50,000 Units total) by mouth every 7 (seven) days., Disp: 12 capsule, Rfl: 1   zolpidem (AMBIEN) 10 MG tablet, TAKE 1 TABLET BY MOUTH EVERY DAY AT BEDTIME AS NEEDED FOR SLEEP, Disp: 30 tablet, Rfl: 3  Past Medical History: Past Medical History:  Diagnosis Date   Atrial flutter ([place])    Diagnosed by ECG August 2015   Colitis    COPD (chronic obstructive pulmonary disease) ([place])    Coronary atherosclerosis of native coronary artery    Multivessel status post CABG   Essential hypertension    Hyperlipidemia    Insomnia  Tobacco Use: Social History   Tobacco Use  Smoking Status Former   Packs/day: 2.00   Years: 27.00   Total pack years: 54.00   Types: Cigarettes   Start date: 02/01/1971   Quit date: 01/31/1998   Years since quitting: 24.4  Smokeless Tobacco Current   Types: Chew    Labs: Review Flowsheet  More data exists      Latest Ref Rng & Units 01/04/2020 07/23/2020 12/26/2020 07/03/2021 02/26/2022  Labs for ITP Cardiac and Pulmonary Rehab  Cholestrol 100 - 199 mg/dL 252  299  280  232  -  LDL (calc) 0 - 99 mg/dL 162  192  193  162  -  HDL-C >39 mg/dL  38  43  42  43  -  Trlycerides 0 - 149 mg/dL 278  323  234  146  -  Hemoglobin A1c 4.8 - 5.6 % - - 5.7  6.0  5.9     Capillary Blood Glucose: Lab Results  Component Value Date   GLUCAP 177 (H) 03/26/2020     Pulmonary Assessment Scores:  Pulmonary Assessment Scores     Row Name 07/01/22 0852         ADL UCSD   SOB Score total 40     Rest 1     Walk 2     Stairs 5     Bath 1     Dress 1     Shop 1       CAT Score   CAT Score 24       mMRC Score   mMRC Score 3             UCSD: Self-administered rating of dyspnea associated with activities of daily living (ADLs) 6-point scale (0 = "not at all" to 5 = "maximal or unable to do because of breathlessness")  Scoring Scores range from 0 to 120.  Minimally important difference is 5 units  CAT: CAT can identify the health impairment of COPD patients and is better correlated with disease progression.  CAT has a scoring range of zero to 40. The CAT score is classified into four groups of low (less than 10), medium (10 - 20), high (21-30) and very high (31-40) based on the impact level of disease on health status. A CAT score over 10 suggests significant symptoms.  A worsening CAT score could be explained by an exacerbation, poor medication adherence, poor inhaler technique, or progression of COPD or comorbid conditions.  CAT MCID is 2 points  mMRC: mMRC (Modified Medical Research Council) Dyspnea Scale is used to assess the degree of baseline functional disability in patients of respiratory disease due to dyspnea. No minimal important difference is established. A decrease in score of 1 point or greater is considered a positive change.   Pulmonary Function Assessment:   Exercise Target Goals: Exercise Program Goal: Individual exercise prescription set using results from initial 6 min walk test and THRR while considering  patient's activity barriers and safety.   Exercise Prescription Goal: Initial exercise prescription  builds to 30-45 minutes a day of aerobic activity, 2-3 days per week.  Home exercise guidelines will be given to patient during program as part of exercise prescription that the participant will acknowledge.  Activity Barriers & Risk Stratification:  Activity Barriers & Cardiac Risk Stratification - 07/01/22 0855       Activity Barriers & Cardiac Risk Stratification   Activity Barriers Other (comment)    Comments peripheral vascular disease  Cardiac Risk Stratification High             6 Minute Walk:  6 Minute Walk     Row Name 07/01/22 0947         6 Minute Walk   Phase Initial     Distance 1200 feet     Walk Time 6 minutes     # of Rest Breaks 0     MPH 2.27     METS 2.56     RPE 15     Perceived Dyspnea  11     VO2 Peak 8.96     Symptoms No     Resting HR 55 bpm     Resting BP 102/70     Resting Oxygen Saturation  93 %     Exercise Oxygen Saturation  during 6 min walk 93 %     Max Ex. HR 74 bpm     Max Ex. BP 138/70     2 Minute Post BP 120/68       Interval HR   1 Minute HR 62     2 Minute HR 64     3 Minute HR 65     4 Minute HR 70     5 Minute HR 70     6 Minute HR 74     2 Minute Post HR 61     Interval Heart Rate? Yes       Interval Oxygen   Interval Oxygen? Yes     Baseline Oxygen Saturation % 93 %     1 Minute Oxygen Saturation % 93 %     1 Minute Liters of Oxygen 0 L     2 Minute Oxygen Saturation % 93 %     2 Minute Liters of Oxygen 0 L     3 Minute Oxygen Saturation % 93 %     3 Minute Liters of Oxygen 0 L     4 Minute Oxygen Saturation % 93 %     4 Minute Liters of Oxygen 0 L     5 Minute Oxygen Saturation % 93 %     5 Minute Liters of Oxygen 0 L     6 Minute Oxygen Saturation % 93 %     6 Minute Liters of Oxygen 0 L     2 Minute Post Oxygen Saturation % 95 %     2 Minute Post Liters of Oxygen 0 L              Oxygen Initial Assessment:  Oxygen Initial Assessment - 07/01/22 0946       Home Oxygen   Home Oxygen Device  None    Sleep Oxygen Prescription None    Home Exercise Oxygen Prescription None    Home Resting Oxygen Prescription None    Compliance with Home Oxygen Use Yes      Initial 6 min Walk   Oxygen Used None      Program Oxygen Prescription   Program Oxygen Prescription None      Intervention   Short Term Goals To learn and understand importance of monitoring SPO2 with pulse oximeter and demonstrate accurate use of the pulse oximeter.;To learn and understand importance of maintaining oxygen saturations>88%;To learn and demonstrate proper pursed lip breathing techniques or other breathing techniques.     Long  Term Goals Verbalizes importance of monitoring SPO2 with pulse oximeter and return demonstration;Maintenance of O2 saturations>88%;Exhibits proper breathing techniques, such as pursed  lip breathing or other method taught during program session;Compliance with respiratory medication             Oxygen Re-Evaluation:   Oxygen Discharge (Final Oxygen Re-Evaluation):   Initial Exercise Prescription:  Initial Exercise Prescription - 07/01/22 0900       Date of Initial Exercise RX and Referring Provider   Date 07/01/22    Referring Provider Dr. Melvyn Novas    Expected Discharge Date 11/04/22      Recumbant Elliptical   Level 1    RPM 45    Minutes 39      Prescription Details   Frequency (times per week) 2    Duration Progress to 30 minutes of continuous aerobic without signs/symptoms of physical distress      Intensity   THRR 40-80% of Max Heartrate 60-121    Ratings of Perceived Exertion 11-13    Perceived Dyspnea 0-4      Resistance Training   Training Prescription Yes    Weight 4    Reps 10-15             Perform Capillary Blood Glucose checks as needed.  Exercise Prescription Changes:   Exercise Comments:   Exercise Goals and Review:   Exercise Goals     Row Name 07/01/22 0953             Exercise Goals   Increase Physical Activity Yes        Intervention Provide advice, education, support and counseling about physical activity/exercise needs.;Develop an individualized exercise prescription for aerobic and resistive training based on initial evaluation findings, risk stratification, comorbidities and participant's personal goals.       Expected Outcomes Short Term: Attend rehab on a regular basis to increase amount of physical activity.;Long Term: Add in home exercise to make exercise part of routine and to increase amount of physical activity.;Long Term: Exercising regularly at least 3-5 days a week.       Increase Strength and Stamina Yes       Intervention Provide advice, education, support and counseling about physical activity/exercise needs.;Develop an individualized exercise prescription for aerobic and resistive training based on initial evaluation findings, risk stratification, comorbidities and participant's personal goals.       Expected Outcomes Short Term: Increase workloads from initial exercise prescription for resistance, speed, and METs.;Short Term: Perform resistance training exercises routinely during rehab and add in resistance training at home;Long Term: Improve cardiorespiratory fitness, muscular endurance and strength as measured by increased METs and functional capacity (6MWT)       Able to understand and use rate of perceived exertion (RPE) scale Yes       Intervention Provide education and explanation on how to use RPE scale       Expected Outcomes Short Term: Able to use RPE daily in rehab to express subjective intensity level;Long Term:  Able to use RPE to guide intensity level when exercising independently       Able to understand and use Dyspnea scale Yes       Intervention Provide education and explanation on how to use Dyspnea scale       Expected Outcomes Short Term: Able to use Dyspnea scale daily in rehab to express subjective sense of shortness of breath during exertion;Long Term: Able to use Dyspnea scale to  guide intensity level when exercising independently       Knowledge and understanding of Target Heart Rate Range (THRR) Yes       Intervention Provide  education and explanation of THRR including how the numbers were predicted and where they are located for reference       Expected Outcomes Short Term: Able to state/look up THRR;Short Term: Able to use daily as guideline for intensity in rehab;Long Term: Able to use THRR to govern intensity when exercising independently       Understanding of Exercise Prescription Yes       Intervention Provide education, explanation, and written materials on patient's individual exercise prescription       Expected Outcomes Short Term: Able to explain program exercise prescription;Long Term: Able to explain home exercise prescription to exercise independently                Exercise Goals Re-Evaluation :   Discharge Exercise Prescription (Final Exercise Prescription Changes):   Nutrition:  Target Goals: Understanding of nutrition guidelines, daily intake of sodium <1543m, cholesterol <2051m calories 30% from fat and 7% or less from saturated fats, daily to have 5 or more servings of fruits and vegetables.  Biometrics:  Pre Biometrics - 07/01/22 0953       Pre Biometrics   Height 6' 1" (1.854 m)    Weight 219 lb 5.7 oz (99.5 kg)    Waist Circumference 44 inches    Hip Circumference 43 inches    Waist to Hip Ratio 1.02 %    BMI (Calculated) 28.95    Triceps Skinfold 20 mm    % Body Fat 30.6 %    Grip Strength 45 kg    Flexibility 0 in    Single Leg Stand 4.35 seconds              Nutrition Therapy Plan and Nutrition Goals:  Nutrition Therapy & Goals - 07/01/22 0901       Intervention Plan   Intervention Nutrition handout(s) given to patient.    Expected Outcomes Short Term Goal: Understand basic principles of dietary content, such as calories, fat, sodium, cholesterol and nutrients.             Nutrition Assessments:   Nutrition Assessments - 07/01/22 0902       MEDFICTS Scores   Pre Score 57            MEDIFICTS Score Key: ?70 Need to make dietary changes  40-70 Heart Healthy Diet ? 40 Therapeutic Level Cholesterol Diet   Picture Your Plate Scores: <4<56nhealthy dietary pattern with much room for improvement. 41-50 Dietary pattern unlikely to meet recommendations for good health and room for improvement. 51-60 More healthful dietary pattern, with some room for improvement.  >60 Healthy dietary pattern, although there may be some specific behaviors that could be improved.    Nutrition Goals Re-Evaluation:   Nutrition Goals Discharge (Final Nutrition Goals Re-Evaluation):   Psychosocial: Target Goals: Acknowledge presence or absence of significant depression and/or stress, maximize coping skills, provide positive support system. Participant is able to verbalize types and ability to use techniques and skills needed for reducing stress and depression.  Initial Review & Psychosocial Screening:  Initial Psych Review & Screening - 07/01/22 0859       Initial Review   Current issues with Current Sleep Concerns;Current Anxiety/Panic      Family Dynamics   Good Support System? Yes    Comments His support system includes his wife, sons, and grand children.      Barriers   Psychosocial barriers to participate in program There are no identifiable barriers or psychosocial needs.  Screening Interventions   Interventions Encouraged to exercise    Expected Outcomes Long Term goal: The participant improves quality of Life and PHQ9 Scores as seen by post scores and/or verbalization of changes;Short Term goal: Identification and review with participant of any Quality of Life or Depression concerns found by scoring the questionnaire.             Quality of Life Scores:  Quality of Life - 07/01/22 0954       Quality of Life   Select Quality of Life      Quality of Life Scores    Health/Function Pre 17.13 %    Socioeconomic Pre 21.75 %    Psych/Spiritual Pre 19.93 %    Family Pre 26.4 %    GLOBAL Pre 19.97 %            Scores of 19 and below usually indicate a poorer quality of life in these areas.  A difference of  2-3 points is a clinically meaningful difference.  A difference of 2-3 points in the total score of the Quality of Life Index has been associated with significant improvement in overall quality of life, self-image, physical symptoms, and general health in studies assessing change in quality of life.   PHQ-9: Review Flowsheet  More data exists      07/01/2022 06/09/2022 04/02/2022 03/24/2022 03/13/2022  Depression screen PHQ 2/9  Decreased Interest 3 0 0 0 0  Down, Depressed, Hopeless 1 0 0 0 0  PHQ - 2 Score 4 0 0 0 0  Altered sleeping 3 - - - -  Tired, decreased energy 3 - - - -  Change in appetite 1 - - - -  Feeling bad or failure about yourself  1 - - - -  Trouble concentrating 1 - - - -  Moving slowly or fidgety/restless 3 - - - -  Suicidal thoughts 0 - - - -  PHQ-9 Score 16 - - - -  Difficult doing work/chores Very difficult - - - -   Interpretation of Total Score  Total Score Depression Severity:  1-4 = Minimal depression, 5-9 = Mild depression, 10-14 = Moderate depression, 15-19 = Moderately severe depression, 20-27 = Severe depression   Psychosocial Evaluation and Intervention:  Psychosocial Evaluation - 07/01/22 0937       Psychosocial Evaluation & Interventions   Interventions Encouraged to exercise with the program and follow exercise prescription    Comments Pt has to participate in PR. He does have current anxiety and sleep concerns. He reports that he takes xanax PRN on days when he is "really on edge", but this is only about once per week. He states that a months supply of xanax lasts him 6-8 months. He does take ambien for his sleep, and he reports that this helps some, but he is unable to stay asleep for more than 2 hours at a  time. 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 He scored a 16 on his PHQ-9, and he relates most of this to his peripheral vascular disease in his legs and his lack of sleep. He is no longer able to walk long distances due to his leg pains and fatigue. He denies have depression or any thoughs of harming himself. He reports that he has a good support system with his wife, 3 sons, and 10 grandchildren. His goals while in the program are  to improve his leg strength and stamina, so that he can be able to walk longer distances again. He has participated in the PR and CR programs before and reported that he benefited from them, so he is eager to start the program again.    Expected Outcomes Pt's anxiety and sleep concerns will continue to be treated, and he will have no other identifiable psychosocial issues.    Continue Psychosocial Services  No Follow up required             Psychosocial Re-Evaluation:   Psychosocial Discharge (Final Psychosocial Re-Evaluation):    Education: Education Goals: Education classes will be provided on a weekly basis, covering required topics. Participant will state understanding/return demonstration of topics presented.  Learning Barriers/Preferences:  Learning Barriers/Preferences - 07/01/22 0388       Learning Barriers/Preferences   Learning Barriers None    Learning Preferences Skilled Demonstration;Individual Instruction             Education Topics: How Lungs Work and Diseases: - Discuss the anatomy of the lungs and diseases that can affect the lungs, such as COPD.   Exercise: -Discuss the importance of exercise, FITT principles of exercise, normal and abnormal responses to exercise, and how to exercise safely.   Environmental Irritants: -Discuss types of environmental irritants and how to limit exposure to environmental irritants.   Meds/Inhalers and  oxygen: - Discuss respiratory medications, definition of an inhaler and oxygen, and the proper way to use an inhaler and oxygen.   Energy Saving Techniques: - Discuss methods to conserve energy and decrease shortness of breath when performing activities of daily living.    Bronchial Hygiene / Breathing Techniques: - Discuss breathing mechanics, pursed-lip breathing technique,  proper posture, effective ways to clear airways, and other functional breathing techniques   Cleaning Equipment: - Provides group verbal and written instruction about the health risks of elevated stress, cause of high stress, and healthy ways to reduce stress.   Nutrition I: Fats: - Discuss the types of cholesterol, what cholesterol does to the body, and how cholesterol levels can be controlled.   Nutrition II: Labels: -Discuss the different components of food labels and how to read food labels.   Respiratory Infections: - Discuss the signs and symptoms of respiratory infections, ways to prevent respiratory infections, and the importance of seeking medical treatment when having a respiratory infection.   Stress I: Signs and Symptoms: - Discuss the causes of stress, how stress may lead to anxiety and depression, and ways to limit stress.   Stress II: Relaxation: -Discuss relaxation techniques to limit stress.   Oxygen for Home/Travel: - Discuss how to prepare for travel when on oxygen and proper ways to transport and store oxygen to ensure safety.   Knowledge Questionnaire Score:  Knowledge Questionnaire Score - 07/01/22 0905       Knowledge Questionnaire Score   Pre Score 15/18             Core Components/Risk Factors/Patient Goals at Admission:  Personal Goals and Risk Factors at Admission - 07/01/22 0908       Core Components/Risk Factors/Patient Goals on Admission   Improve shortness of breath with ADL's Yes    Intervention Provide education, individualized exercise plan and daily  activity instruction to help decrease symptoms of SOB with activities of daily living.    Expected Outcomes Short Term: Improve cardiorespiratory fitness to achieve a reduction of symptoms when performing ADLs;Long Term: Be able to perform more ADLs without symptoms  or delay the onset of symptoms    Increase knowledge of respiratory medications and ability to use respiratory devices properly  Yes    Intervention Provide education and demonstration as needed of appropriate use of medications, inhalers, and oxygen therapy.    Expected Outcomes Short Term: Achieves understanding of medications use. Understands that oxygen is a medication prescribed by physician. Demonstrates appropriate use of inhaler and oxygen therapy.;Long Term: Maintain appropriate use of medications, inhalers, and oxygen therapy.    Personal Goal Other Yes    Personal Goal Improve leg strength and stamina.    Intervention Attend PR two days per week and begin a home exercise plan.    Expected Outcomes Pt will reach stated goals.             Core Components/Risk Factors/Patient Goals Review:    Core Components/Risk Factors/Patient Goals at Discharge (Final Review):    ITP Comments:   Comments: Patient arrived for 1st visit/orientation/education at 0800. Patient was referred to PR by Dr. Melvyn Novas due to COPD mixed type (J44.9). During orientation advised patient on arrival and appointment times what to wear, what to do before, during and after exercise. Reviewed attendance and class policy.  Pt is scheduled to return Pulmonary Rehab on 07/03/2022 at 1045. Pt was advised to come to class 15 minutes before class starts.  Discussed RPE/Dpysnea scales. Patient participated in warm up stretches. Patient was able to complete 6 minute walk test. Patient was measured for the equipment. Discussed equipment safety with patient. Took patient pre-anthropometric measurements. Patient finished visit at 0915.

## 2022-07-03 ENCOUNTER — Encounter (HOSPITAL_COMMUNITY)
Admission: RE | Admit: 2022-07-03 | Discharge: 2022-07-03 | Disposition: A | Discharge status: Home / Self Care | Payer: Medicare Other | Source: Ambulatory Visit | Attending: Internal Medicine | Admitting: Internal Medicine

## 2022-07-03 DIAGNOSIS — J449 Chronic obstructive pulmonary disease, unspecified: Secondary | ICD-10-CM

## 2022-07-03 NOTE — Progress Notes (Signed)
Daily Session Note  Patient Details  Name: Dennis [name] MRN: [id number] Date of Birth: 1951/03/02 Referring Provider:   Flowsheet Row PULMONARY REHAB COPD ORIENTATION from 07/01/2022 in [place]  Referring Provider Dr. Melvyn Novas       Encounter Date: 07/03/2022  Check In:  Session Check In - 07/03/22 1043       Check-In   Supervising physician immediately available to respond to emergencies [facility] MD immediately available    Physician(s) Dr. Zandra Abts    Location [facility]    Staff Present Hoy Register, MS, ACSM-CEP, Exercise Physiologist;Heather Zigmund Daniel, Exercise Physiologist    Virtual Visit No    Medication changes reported     No    Fall or balance concerns reported    No    Tobacco Cessation No Change    Warm-up and Cool-down Performed as group-led instruction    Resistance Training Performed Yes    VAD Patient? No    PAD/SET Patient? No      Pain Assessment   Currently in Pain? No/denies    Multiple Pain Sites No             Capillary Blood Glucose: No results found for this or any previous visit (from the past 24 hour(s)).    Social History   Tobacco Use  Smoking Status Former   Packs/day: 2.00   Years: 27.00   Total pack years: 54.00   Types: Cigarettes   Start date: 02/01/1971   Quit date: 01/31/1998   Years since quitting: 24.4  Smokeless Tobacco Current   Types: Chew    Goals Met:  Proper associated with RPD/PD & O2 Sat Independence with exercise equipment Using PLB without cueing & demonstrates good technique Exercise tolerated well Queuing for purse lip breathing No report of concerns or symptoms today Strength training completed today  Goals Unmet:  Not Applicable  Comments: checkout time is 1145   Dr. Kathie Dike is Medical Director for [facility] Pul[facility].

## 2022-07-08 ENCOUNTER — Telehealth: Payer: Self-pay | Admitting: Internal Medicine

## 2022-07-08 ENCOUNTER — Other Ambulatory Visit: Payer: Self-pay | Admitting: Internal Medicine

## 2022-07-08 ENCOUNTER — Encounter (HOSPITAL_COMMUNITY)
Admission: RE | Admit: 2022-07-08 | Discharge: 2022-07-08 | Disposition: A | Discharge status: Home / Self Care | Payer: Medicare Other | Source: Ambulatory Visit | Attending: Internal Medicine | Admitting: Internal Medicine

## 2022-07-08 VITALS — Wt 217.4 lb

## 2022-07-08 DIAGNOSIS — G4700 Insomnia, unspecified: Secondary | ICD-10-CM

## 2022-07-08 DIAGNOSIS — J449 Chronic obstructive pulmonary disease, unspecified: Secondary | ICD-10-CM | POA: Diagnosis not present

## 2022-07-08 DIAGNOSIS — E785 Hyperlipidemia, unspecified: Secondary | ICD-10-CM | POA: Diagnosis not present

## 2022-07-08 DIAGNOSIS — N1831 Chronic kidney disease, stage 3a: Secondary | ICD-10-CM | POA: Diagnosis not present

## 2022-07-08 MED ORDER — ZOLPIDEM TARTRATE 10 MG PO TABS: 10.0000 mg | ORAL_TABLET | Freq: Every evening | ORAL | 3 refills | Status: DC | PRN

## 2022-07-08 NOTE — Progress Notes (Signed)
Daily Session Note  Patient Details  Name: Dennis [name] MRN: [id number] Date of Birth: 11-20-1951 Referring Provider:   Flowsheet Row PULMONARY REHAB COPD ORIENTATION from 07/01/2022 in [place]  Referring Provider Dr. Melvyn Novas       Encounter Date: 07/08/2022  Check In:  Session Check In - 07/08/22 1045       Check-In   Supervising physician immediately available to respond to emergencies [facility] MD immediately available    Physician(s) Dr. Domenic Polite    Location [facility]    Staff Present Hoy Register, MS, ACSM-CEP, Exercise Physiologist;Heather Zigmund Daniel, Exercise Physiologist    Virtual Visit No    Medication changes reported     No    Fall or balance concerns reported    No    Tobacco Cessation No Change    Warm-up and Cool-down Performed as group-led instruction    Resistance Training Performed Yes    VAD Patient? No    PAD/SET Patient? No      Pain Assessment   Currently in Pain? No/denies    Multiple Pain Sites No             Capillary Blood Glucose: No results found for this or any previous visit (from the past 24 hour(s)).    Social History   Tobacco Use  Smoking Status Former   Packs/day: 2.00   Years: 27.00   Total pack years: 54.00   Types: Cigarettes   Start date: 02/01/1971   Quit date: 01/31/1998   Years since quitting: 24.4  Smokeless Tobacco Current   Types: Chew    Goals Met:  Proper associated with RPD/PD & O2 Sat Independence with exercise equipment Using PLB without cueing & demonstrates good technique Exercise tolerated well Queuing for purse lip breathing No report of concerns or symptoms today Strength training completed today  Goals Unmet:  Not Applicable  Comments: checkout time is 1145   Dr. Kathie Dike is Medical Director for [facility] Pul[facility].

## 2022-07-08 NOTE — Telephone Encounter (Signed)
Request refill for :   zolpidem (AMBIEN) 10 MG tablet

## 2022-07-09 LAB — BASIC METABOLIC PANEL
BUN/Creatinine Ratio: 14 (ref 10–24)
BUN: 20 mg/dL (ref 8–27)
CO2: 25 mmol/L (ref 20–29)
Calcium: 9.6 mg/dL (ref 8.6–10.2)
Chloride: 99 mmol/L (ref 96–106)
Creatinine, Ser: 1.38 mg/dL — ABNORMAL HIGH (ref 0.76–1.27)
Glucose: 109 mg/dL — ABNORMAL HIGH (ref 70–99)
Potassium: 5 mmol/L (ref 3.5–5.2)
Sodium: 139 mmol/L (ref 134–144)
eGFR: 55 mL/min/{1.73_m2} — ABNORMAL LOW (ref >59)

## 2022-07-09 LAB — LIPID PANEL
Chol/HDL Ratio: 5.5 ratio — ABNORMAL HIGH (ref 0.0–5.0)
Cholesterol, Total: 238 mg/dL — ABNORMAL HIGH (ref 100–199)
HDL: 43 mg/dL (ref >39)
LDL Chol Calc (NIH): 161 mg/dL — ABNORMAL HIGH (ref 0–99)
Triglycerides: 183 mg/dL — ABNORMAL HIGH (ref 0–149)
VLDL Cholesterol Cal: 34 mg/dL (ref 5–40)

## 2022-07-09 NOTE — Progress Notes (Signed)
Pulmonary Individual Treatment Plan  Patient Details  Name: Dennis [name] MRN: [id number] Date of Birth: March 01, 1951 Referring Provider:   Flowsheet Row PULMONARY REHAB COPD ORIENTATION from 07/01/2022 in [facility] CARDIAC REHABILITATION  Referring Provider Dr. Sherene Sires       Initial Encounter Date:  Flowsheet Row PULMONARY REHAB COPD ORIENTATION from 07/01/2022 in [facility] CARDIAC REHABILITATION  Date 07/01/22       Visit Diagnosis: COPD mixed type (HCC)  Patient's Home Medications on Admission:   Current Outpatient Medications:    albuterol (PROVENTIL) (2.5 MG/3ML) 0.083% nebulizer solution, Take 3 mLs (2.5 mg total) by nebulization every 6 (six) hours as needed for wheezing or shortness of breath., Disp: 150 mL, Rfl: 3   albuterol (VENTOLIN HFA) 108 (90 Base) MCG/ACT inhaler, Inhale 2 puffs into the lungs every 4 (four) hours as needed for wheezing., Disp: 18 g, Rfl: 5   ALPRAZolam (XANAX) 0.5 MG tablet, Take 1 tablet (0.5 mg total) by mouth 2 (two) times daily as needed for anxiety., Disp: 30 tablet, Rfl: 1   aspirin EC 81 MG tablet, Take 1 tablet (81 mg total) by mouth daily. (Patient taking differently: Take 81 mg by mouth 2 (two) times a week.), Disp: 30 tablet, Rfl: 11   budesonide-formoterol (SYMBICORT) 160-4.5 MCG/ACT inhaler, TAKE 2 PUFFS FIRST THING IN MORNING AND THEN ANOTHER 2 PUFFS ABOUT 12 HOURS LATER., Disp: 30.6 each, Rfl: 1   ezetimibe-simvastatin (VYTORIN) 10-40 MG tablet, TAKE 1 TABLET BY MOUTH AT BEDTIME. TAKE 1 TABLET BY MOUTH EVERYDAY AT BEDTIME, Disp: 90 tablet, Rfl: 1   fenofibrate 160 MG tablet, TAKE 1 TABLET BY MOUTH EVERY DAY, Disp: 90 tablet, Rfl: 1   [name] oil-omega-3 fatty acids 1000 MG capsule, Take 1 g by mouth in the morning and at bedtime., Disp: , Rfl:    guaiFENesin (MUCINEX) 600 MG 12 hr tablet, Take 1 tablet (600 mg total) by mouth 2 (two) times daily., Disp: 20 tablet, Rfl: 0   losartan-hydrochlorothiazide (HYZAAR) 100-25 MG tablet, TAKE 1 TABLET BY  MOUTH EVERY DAY, Disp: 90 tablet, Rfl: 0   metoprolol tartrate (LOPRESSOR) 50 MG tablet, TAKE 1 TABLET BY MOUTH TWICE A DAY, Disp: 180 tablet, Rfl: 1   mupirocin ointment (BACTROBAN) 2 %, Apply 1 application topically 2 (two) times daily. (Patient taking differently: Apply 1 application  topically 2 (two) times daily as needed (wound care).), Disp: 22 g, Rfl: 0   nitroGLYCERIN (NITROSTAT) 0.4 MG SL tablet, PLACE 1 TABLET (0.4 MG TOTAL) UNDER THE TONGUE EVERY 5 (FIVE) MINUTES AS NEEDED., Disp: 25 tablet, Rfl: 3   Respiratory Therapy Supplies (NEBULIZER/TUBING/MOUTHPIECE) KIT, Nebulizer tubing and mouthpiece, Disp: 1 kit, Rfl: 0   tamsulosin (FLOMAX) 0.4 MG CAPS capsule, Take 1 capsule (0.4 mg total) by mouth daily., Disp: 90 capsule, Rfl: 3   Vitamin D, Ergocalciferol, (DRISDOL) 1.25 MG (50000 UNIT) CAPS capsule, Take 1 capsule (50,000 Units total) by mouth every 7 (seven) days., Disp: 12 capsule, Rfl: 1   zolpidem (AMBIEN) 10 MG tablet, Take 1 tablet (10 mg total) by mouth at bedtime as needed for sleep., Disp: 30 tablet, Rfl: 3  Past Medical History: Past Medical History:  Diagnosis Date   Atrial flutter (HCC)    Diagnosed by ECG August 2015   Colitis    COPD (chronic obstructive pulmonary disease) (HCC)    Coronary atherosclerosis of native coronary artery    Multivessel status post CABG   Essential hypertension    Hyperlipidemia    Insomnia  Tobacco Use: Social History   Tobacco Use  Smoking Status Former   Packs/day: 2.00   Years: 27.00   Total pack years: 54.00   Types: Cigarettes   Start date: 02/01/1971   Quit date: 01/31/1998   Years since quitting: 24.4  Smokeless Tobacco Current   Types: Chew    Labs: Review Flowsheet  More data exists      Latest Ref Rng & Units 07/23/2020 12/26/2020 07/03/2021 02/26/2022 07/08/2022  Labs for ITP Cardiac and Pulmonary Rehab  Cholestrol 100 - 199 mg/dL 299  280  232  - 238   LDL (calc) 0 - 99 mg/dL 192  193  162  - 161   HDL-C >39  mg/dL 43  42  43  - 43   Trlycerides 0 - 149 mg/dL 323  234  146  - 183   Hemoglobin A1c 4.8 - 5.6 % - 5.7  6.0  5.9  -    Capillary Blood Glucose: Lab Results  Component Value Date   GLUCAP 177 (H) 03/26/2020     Pulmonary Assessment Scores:  Pulmonary Assessment Scores     Row Name 07/01/22 0852         ADL UCSD   SOB Score total 40     Rest 1     Walk 2     Stairs 5     Bath 1     Dress 1     Shop 1       CAT Score   CAT Score 24       mMRC Score   mMRC Score 3             UCSD: Self-administered rating of dyspnea associated with activities of daily living (ADLs) 6-point scale (0 = "not at all" to 5 = "maximal or unable to do because of breathlessness")  Scoring Scores range from 0 to 120.  Minimally important difference is 5 units  CAT: CAT can identify the health impairment of COPD patients and is better correlated with disease progression.  CAT has a scoring range of zero to 40. The CAT score is classified into four groups of low (less than 10), medium (10 - 20), high (21-30) and very high (31-40) based on the impact level of disease on health status. A CAT score over 10 suggests significant symptoms.  A worsening CAT score could be explained by an exacerbation, poor medication adherence, poor inhaler technique, or progression of COPD or comorbid conditions.  CAT MCID is 2 points  mMRC: mMRC (Modified Medical Research Council) Dyspnea Scale is used to assess the degree of baseline functional disability in patients of respiratory disease due to dyspnea. No minimal important difference is established. A decrease in score of 1 point or greater is considered a positive change.   Pulmonary Function Assessment:   Exercise Target Goals: Exercise Program Goal: Individual exercise prescription set using results from initial 6 min walk test and THRR while considering  patient's activity barriers and safety.   Exercise Prescription Goal: Initial exercise  prescription builds to 30-45 minutes a day of aerobic activity, 2-3 days per week.  Home exercise guidelines will be given to patient during program as part of exercise prescription that the participant will acknowledge.  Activity Barriers & Risk Stratification:  Activity Barriers & Cardiac Risk Stratification - 07/01/22 0855       Activity Barriers & Cardiac Risk Stratification   Activity Barriers Other (comment)    Comments peripheral vascular disease  Cardiac Risk Stratification High             6 Minute Walk:  6 Minute Walk     Row Name 07/01/22 0947         6 Minute Walk   Phase Initial     Distance 1200 feet     Walk Time 6 minutes     # of Rest Breaks 0     MPH 2.27     METS 2.56     RPE 15     Perceived Dyspnea  11     VO2 Peak 8.96     Symptoms No     Resting HR 55 bpm     Resting BP 102/70     Resting Oxygen Saturation  93 %     Exercise Oxygen Saturation  during 6 min walk 93 %     Max Ex. HR 74 bpm     Max Ex. BP 138/70     2 Minute Post BP 120/68       Interval HR   1 Minute HR 62     2 Minute HR 64     3 Minute HR 65     4 Minute HR 70     5 Minute HR 70     6 Minute HR 74     2 Minute Post HR 61     Interval Heart Rate? Yes       Interval Oxygen   Interval Oxygen? Yes     Baseline Oxygen Saturation % 93 %     1 Minute Oxygen Saturation % 93 %     1 Minute Liters of Oxygen 0 L     2 Minute Oxygen Saturation % 93 %     2 Minute Liters of Oxygen 0 L     3 Minute Oxygen Saturation % 93 %     3 Minute Liters of Oxygen 0 L     4 Minute Oxygen Saturation % 93 %     4 Minute Liters of Oxygen 0 L     5 Minute Oxygen Saturation % 93 %     5 Minute Liters of Oxygen 0 L     6 Minute Oxygen Saturation % 93 %     6 Minute Liters of Oxygen 0 L     2 Minute Post Oxygen Saturation % 95 %     2 Minute Post Liters of Oxygen 0 L              Oxygen Initial Assessment:  Oxygen Initial Assessment - 07/01/22 0946       Home Oxygen   Home  Oxygen Device None    Sleep Oxygen Prescription None    Home Exercise Oxygen Prescription None    Home Resting Oxygen Prescription None    Compliance with Home Oxygen Use Yes      Initial 6 min Walk   Oxygen Used None      Program Oxygen Prescription   Program Oxygen Prescription None      Intervention   Short Term Goals To learn and understand importance of monitoring SPO2 with pulse oximeter and demonstrate accurate use of the pulse oximeter.;To learn and understand importance of maintaining oxygen saturations>88%;To learn and demonstrate proper pursed lip breathing techniques or other breathing techniques.     Long  Term Goals Verbalizes importance of monitoring SPO2 with pulse oximeter and return demonstration;Maintenance of O2 saturations>88%;Exhibits proper breathing techniques, such as pursed  lip breathing or other method taught during program session;Compliance with respiratory medication             Oxygen Re-Evaluation:  Oxygen Re-Evaluation     Row Name 07/08/22 1253             Program Oxygen Prescription   Program Oxygen Prescription None         Home Oxygen   Home Oxygen Device None       Sleep Oxygen Prescription None       Home Exercise Oxygen Prescription None       Home Resting Oxygen Prescription None       Compliance with Home Oxygen Use Yes         Goals/Expected Outcomes   Short Term Goals To learn and understand importance of monitoring SPO2 with pulse oximeter and demonstrate accurate use of the pulse oximeter.;To learn and understand importance of maintaining oxygen saturations>88%;To learn and demonstrate proper pursed lip breathing techniques or other breathing techniques.        Long  Term Goals Verbalizes importance of monitoring SPO2 with pulse oximeter and return demonstration;Maintenance of O2 saturations>88%;Exhibits proper breathing techniques, such as pursed lip breathing or other method taught during program session;Compliance with  respiratory medication       Goals/Expected Outcomes Compliance                Oxygen Discharge (Final Oxygen Re-Evaluation):  Oxygen Re-Evaluation - 07/08/22 1253       Program Oxygen Prescription   Program Oxygen Prescription None      Home Oxygen   Home Oxygen Device None    Sleep Oxygen Prescription None    Home Exercise Oxygen Prescription None    Home Resting Oxygen Prescription None    Compliance with Home Oxygen Use Yes      Goals/Expected Outcomes   Short Term Goals To learn and understand importance of monitoring SPO2 with pulse oximeter and demonstrate accurate use of the pulse oximeter.;To learn and understand importance of maintaining oxygen saturations>88%;To learn and demonstrate proper pursed lip breathing techniques or other breathing techniques.     Long  Term Goals Verbalizes importance of monitoring SPO2 with pulse oximeter and return demonstration;Maintenance of O2 saturations>88%;Exhibits proper breathing techniques, such as pursed lip breathing or other method taught during program session;Compliance with respiratory medication    Goals/Expected Outcomes Compliance             Initial Exercise Prescription:  Initial Exercise Prescription - 07/01/22 0900       Date of Initial Exercise RX and Referring Provider   Date 07/01/22    Referring Provider Dr. Melvyn Novas    Expected Discharge Date 11/04/22      Recumbant Elliptical   Level 1    RPM 45    Minutes 39      Prescription Details   Frequency (times per week) 2    Duration Progress to 30 minutes of continuous aerobic without signs/symptoms of physical distress      Intensity   THRR 40-80% of Max Heartrate 60-121    Ratings of Perceived Exertion 11-13    Perceived Dyspnea 0-4      Resistance Training   Training Prescription Yes    Weight 4    Reps 10-15             Perform Capillary Blood Glucose checks as needed.  Exercise Prescription Changes:   Exercise Prescription Changes      Row Name 07/08/22 1200  Response to Exercise   Blood Pressure (Admit) 126/70       Blood Pressure (Exercise) 124/70       Blood Pressure (Exit) 108/68       Heart Rate (Admit) 60 bpm       Heart Rate (Exercise) 66 bpm       Heart Rate (Exit) 65 bpm       Oxygen Saturation (Admit) 92 %       Oxygen Saturation (Exercise) 93 %       Oxygen Saturation (Exit) 95 %       Rating of Perceived Exertion (Exercise) 12       Perceived Dyspnea (Exercise) 12       Duration Continue with 30 min of aerobic exercise without signs/symptoms of physical distress.       Intensity THRR unchanged         Progression   Progression Continue to progress workloads to maintain intensity without signs/symptoms of physical distress.         Resistance Training   Training Prescription Yes       Weight 5       Reps 10-15       Time 10 Minutes         Recumbant Elliptical   Level 1       RPM 55       Minutes 39       METs 3                Exercise Comments:   Exercise Goals and Review:   Exercise Goals     Row Name 07/01/22 0953 07/08/22 1250           Exercise Goals   Increase Physical Activity Yes Yes      Intervention Provide advice, education, support and counseling about physical activity/exercise needs.;Develop an individualized exercise prescription for aerobic and resistive training based on initial evaluation findings, risk stratification, comorbidities and participant's personal goals. Provide advice, education, support and counseling about physical activity/exercise needs.;Develop an individualized exercise prescription for aerobic and resistive training based on initial evaluation findings, risk stratification, comorbidities and participant's personal goals.      Expected Outcomes Short Term: Attend rehab on a regular basis to increase amount of physical activity.;Long Term: Add in home exercise to make exercise part of routine and to increase amount of physical  activity.;Long Term: Exercising regularly at least 3-5 days a week. Short Term: Attend rehab on a regular basis to increase amount of physical activity.;Long Term: Add in home exercise to make exercise part of routine and to increase amount of physical activity.;Long Term: Exercising regularly at least 3-5 days a week.      Increase Strength and Stamina Yes Yes      Intervention Provide advice, education, support and counseling about physical activity/exercise needs.;Develop an individualized exercise prescription for aerobic and resistive training based on initial evaluation findings, risk stratification, comorbidities and participant's personal goals. Provide advice, education, support and counseling about physical activity/exercise needs.;Develop an individualized exercise prescription for aerobic and resistive training based on initial evaluation findings, risk stratification, comorbidities and participant's personal goals.      Expected Outcomes Short Term: Increase workloads from initial exercise prescription for resistance, speed, and METs.;Short Term: Perform resistance training exercises routinely during rehab and add in resistance training at home;Long Term: Improve cardiorespiratory fitness, muscular endurance and strength as measured by increased METs and functional capacity (6MWT) Short Term: Increase workloads from initial exercise prescription  for resistance, speed, and METs.;Short Term: Perform resistance training exercises routinely during rehab and add in resistance training at home;Long Term: Improve cardiorespiratory fitness, muscular endurance and strength as measured by increased METs and functional capacity (6MWT)      Able to understand and use rate of perceived exertion (RPE) scale Yes Yes      Intervention Provide education and explanation on how to use RPE scale Provide education and explanation on how to use RPE scale      Expected Outcomes Short Term: Able to use RPE daily in rehab  to express subjective intensity level;Long Term:  Able to use RPE to guide intensity level when exercising independently Short Term: Able to use RPE daily in rehab to express subjective intensity level;Long Term:  Able to use RPE to guide intensity level when exercising independently      Able to understand and use Dyspnea scale Yes Yes      Intervention Provide education and explanation on how to use Dyspnea scale Provide education and explanation on how to use Dyspnea scale      Expected Outcomes Short Term: Able to use Dyspnea scale daily in rehab to express subjective sense of shortness of breath during exertion;Long Term: Able to use Dyspnea scale to guide intensity level when exercising independently Short Term: Able to use Dyspnea scale daily in rehab to express subjective sense of shortness of breath during exertion;Long Term: Able to use Dyspnea scale to guide intensity level when exercising independently      Knowledge and understanding of Target Heart Rate Range (THRR) Yes --      Intervention Provide education and explanation of THRR including how the numbers were predicted and where they are located for reference Provide education and explanation of THRR including how the numbers were predicted and where they are located for reference      Expected Outcomes Short Term: Able to state/look up THRR;Short Term: Able to use daily as guideline for intensity in rehab;Long Term: Able to use THRR to govern intensity when exercising independently Short Term: Able to state/look up THRR;Short Term: Able to use daily as guideline for intensity in rehab;Long Term: Able to use THRR to govern intensity when exercising independently      Understanding of Exercise Prescription Yes Yes      Intervention Provide education, explanation, and written materials on patient's individual exercise prescription Provide education, explanation, and written materials on patient's individual exercise prescription      Expected  Outcomes Short Term: Able to explain program exercise prescription;Long Term: Able to explain home exercise prescription to exercise independently Short Term: Able to explain program exercise prescription;Long Term: Able to explain home exercise prescription to exercise independently               Exercise Goals Re-Evaluation :  Exercise Goals Re-Evaluation     Row Name 07/08/22 1251             Exercise Goal Re-Evaluation   Exercise Goals Review Increase Physical Activity;Increase Strength and Stamina;Able to understand and use rate of perceived exertion (RPE) scale;Able to understand and use Dyspnea scale;Able to check pulse independently;Understanding of Exercise Prescription       Comments Pt has completed 2 sessions of PR. He is motivated during class and focused on increasing his distance on each class. He is currently exercising at 3.0 METs on the ellp. Will continue to monitor and progress as able.       Expected Outcomes Through exercise at  rehab and at home, the patient will meet their stated goals.                Discharge Exercise Prescription (Final Exercise Prescription Changes):  Exercise Prescription Changes - 07/08/22 1200       Response to Exercise   Blood Pressure (Admit) 126/70    Blood Pressure (Exercise) 124/70    Blood Pressure (Exit) 108/68    Heart Rate (Admit) 60 bpm    Heart Rate (Exercise) 66 bpm    Heart Rate (Exit) 65 bpm    Oxygen Saturation (Admit) 92 %    Oxygen Saturation (Exercise) 93 %    Oxygen Saturation (Exit) 95 %    Rating of Perceived Exertion (Exercise) 12    Perceived Dyspnea (Exercise) 12    Duration Continue with 30 min of aerobic exercise without signs/symptoms of physical distress.    Intensity THRR unchanged      Progression   Progression Continue to progress workloads to maintain intensity without signs/symptoms of physical distress.      Resistance Training   Training Prescription Yes    Weight 5    Reps 10-15     Time 10 Minutes      Recumbant Elliptical   Level 1    RPM 55    Minutes 39    METs 3             Nutrition:  Target Goals: Understanding of nutrition guidelines, daily intake of sodium '1500mg'$ , cholesterol '200mg'$ , calories 30% from fat and 7% or less from saturated fats, daily to have 5 or more servings of fruits and vegetables.  Biometrics:  Pre Biometrics - 07/01/22 0953       Pre Biometrics   Height $Remov'6\' 1"'VTOvks$  (1.854 m)    Weight 99.5 kg    Waist Circumference 44 inches    Hip Circumference 43 inches    Waist to Hip Ratio 1.02 %    BMI (Calculated) 28.95    Triceps Skinfold 20 mm    % Body Fat 30.6 %    Grip Strength 45 kg    Flexibility 0 in    Single Leg Stand 4.35 seconds              Nutrition Therapy Plan and Nutrition Goals:  Nutrition Therapy & Goals - 07/02/22 0848       Personal Nutrition Goals   Comments Patient scored 57 on his diet assessment. We offer 2 educational sessions on heart healthy nutrition with handouts.      Intervention Plan   Intervention Nutrition handout(s) given to patient.    Expected Outcomes Short Term Goal: Understand basic principles of dietary content, such as calories, fat, sodium, cholesterol and nutrients.             Nutrition Assessments:  Nutrition Assessments - 07/01/22 0902       MEDFICTS Scores   Pre Score 57            MEDIFICTS Score Key: ?70 Need to make dietary changes  40-70 Heart Healthy Diet ? 40 Therapeutic Level Cholesterol Diet   Picture Your Plate Scores: <67 Unhealthy dietary pattern with much room for improvement. 41-50 Dietary pattern unlikely to meet recommendations for good health and room for improvement. 51-60 More healthful dietary pattern, with some room for improvement.  >60 Healthy dietary pattern, although there may be some specific behaviors that could be improved.    Nutrition Goals Re-Evaluation:   Nutrition Goals Discharge (Final Nutrition  Goals  Re-Evaluation):   Psychosocial: Target Goals: Acknowledge presence or absence of significant depression and/or stress, maximize coping skills, provide positive support system. Participant is able to verbalize types and ability to use techniques and skills needed for reducing stress and depression.  Initial Review & Psychosocial Screening:  Initial Psych Review & Screening - 07/01/22 0859       Initial Review   Current issues with Current Sleep Concerns;Current Anxiety/Panic      Family Dynamics   Good Support System? Yes    Comments His support system includes his wife, sons, and grand children.      Barriers   Psychosocial barriers to participate in program There are no identifiable barriers or psychosocial needs.      Screening Interventions   Interventions Encouraged to exercise    Expected Outcomes Long Term goal: The participant improves quality of Life and PHQ9 Scores as seen by post scores and/or verbalization of changes;Short Term goal: Identification and review with participant of any Quality of Life or Depression concerns found by scoring the questionnaire.             Quality of Life Scores:  Quality of Life - 07/01/22 0954       Quality of Life   Select Quality of Life      Quality of Life Scores   Health/Function Pre 17.13 %    Socioeconomic Pre 21.75 %    Psych/Spiritual Pre 19.93 %    Family Pre 26.4 %    GLOBAL Pre 19.97 %            Scores of 19 and below usually indicate a poorer quality of life in these areas.  A difference of  2-3 points is a clinically meaningful difference.  A difference of 2-3 points in the total score of the Quality of Life Index has been associated with significant improvement in overall quality of life, self-image, physical symptoms, and general health in studies assessing change in quality of life.   PHQ-9: Review Flowsheet  More data exists      07/01/2022 06/09/2022 04/02/2022 03/24/2022 03/13/2022  Depression screen PHQ  2/9  Decreased Interest 3 0 0 0 0  Down, Depressed, Hopeless 1 0 0 0 0  PHQ - 2 Score 4 0 0 0 0  Altered sleeping 3 - - - -  Tired, decreased energy 3 - - - -  Change in appetite 1 - - - -  Feeling bad or failure about yourself  1 - - - -  Trouble concentrating 1 - - - -  Moving slowly or fidgety/restless 3 - - - -  Suicidal thoughts 0 - - - -  PHQ-9 Score 16 - - - -  Difficult doing work/chores Very difficult - - - -   Interpretation of Total Score  Total Score Depression Severity:  1-4 = Minimal depression, 5-9 = Mild depression, 10-14 = Moderate depression, 15-19 = Moderately severe depression, 20-27 = Severe depression   Psychosocial Evaluation and Intervention:  Psychosocial Evaluation - 07/01/22 0937       Psychosocial Evaluation & Interventions   Interventions Encouraged to exercise with the program and follow exercise prescription    Comments Pt has to participate in PR. He does have current anxiety and sleep concerns. He reports that he takes xanax PRN on days when he is "really on edge", but this is only about once per week. He states that a months supply of xanax lasts him 6-8 months. He  does take ambien for his sleep, and he reports that this helps some, but he is unable to stay asleep for more than 2 hours at a time. 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 He scored a 16 on his PHQ-9, and he relates most of this to his peripheral vascular disease in his legs and his lack of sleep. He is no longer able to walk long distances due to his leg pains and fatigue. He denies have depression or any thoughs of harming himself. He reports that he has a good support system with his wife, 3 sons, and 10 grandchildren. His goals while in the program are to improve his leg strength and stamina, so that he can be able to walk longer distances again. He has participated in the PR and CR programs before  and reported that he benefited from them, so he is eager to start the program again.    Expected Outcomes Pt's anxiety and sleep concerns will continue to be treated, and he will have no other identifiable psychosocial issues.    Continue Psychosocial Services  No Follow up required             Psychosocial Re-Evaluation:  Psychosocial Re-Evaluation     [place] Name 07/02/22 [id number]             Psychosocial Re-Evaluation   Current issues with Current Anxiety/Panic;Current Sleep Concerns       Comments Patient is new to the program. He plans to start tomorrow 8/10. We will continue to monitor his progress in the program.       Expected Outcomes Patient will continue to have no psychosocial barriers and his anxiety and sleep will continue to be managed with Alprazolam and Ambien prn.       Interventions Encouraged to attend Pulmonary Rehabilitation for the exercise;Relaxation education;Stress management education       Continue Psychosocial Services  No Follow up required                Psychosocial Discharge (Final Psychosocial Re-Evaluation):  Psychosocial Re-Evaluation - 07/02/22 1478       Psychosocial Re-Evaluation   Current issues with Current Anxiety/Panic;Current Sleep Concerns    Comments Patient is new to the program. He plans to start tomorrow 8/10. We will continue to monitor his progress in the program.    Expected Outcomes Patient will continue to have no psychosocial barriers and his anxiety and sleep will continue to be managed with Alprazolam and Ambien prn.    Interventions Encouraged to attend Pulmonary Rehabilitation for the exercise;Relaxation education;Stress management education    Continue Psychosocial Services  No Follow up required              Education: Education Goals: Education classes will be provided on a weekly basis, covering required topics. Participant will state understanding/return demonstration of topics presented.  Learning  Barriers/Preferences:  Learning Barriers/Preferences - 07/01/22 2956       Learning Barriers/Preferences   Learning Barriers None    Learning Preferences Skilled Demonstration;Individual Instruction             Education Topics: How Lungs Work and Diseases: - Discuss the anatomy of the lungs and diseases that can affect the lungs, such as COPD.   Exercise: -Discuss the importance of exercise, FITT principles of exercise, normal and abnormal responses to exercise, and how to exercise safely.   Environmental  Irritants: -Discuss types of environmental irritants and how to limit exposure to environmental irritants.   Meds/Inhalers and oxygen: - Discuss respiratory medications, definition of an inhaler and oxygen, and the proper way to use an inhaler and oxygen.   Energy Saving Techniques: - Discuss methods to conserve energy and decrease shortness of breath when performing activities of daily living.    Bronchial Hygiene / Breathing Techniques: - Discuss breathing mechanics, pursed-lip breathing technique,  proper posture, effective ways to clear airways, and other functional breathing techniques   Cleaning Equipment: - Provides group verbal and written instruction about the health risks of elevated stress, cause of high stress, and healthy ways to reduce stress.   Nutrition I: Fats: - Discuss the types of cholesterol, what cholesterol does to the body, and how cholesterol levels can be controlled.   Nutrition II: Labels: -Discuss the different components of food labels and how to read food labels.   Respiratory Infections: - Discuss the signs and symptoms of respiratory infections, ways to prevent respiratory infections, and the importance of seeking medical treatment when having a respiratory infection.   Stress I: Signs and Symptoms: - Discuss the causes of stress, how stress may lead to anxiety and depression, and ways to limit stress.   Stress II:  Relaxation: -Discuss relaxation techniques to limit stress.   Oxygen for Home/Travel: - Discuss how to prepare for travel when on oxygen and proper ways to transport and store oxygen to ensure safety.   Knowledge Questionnaire Score:  Knowledge Questionnaire Score - 07/01/22 0905       Knowledge Questionnaire Score   Pre Score 15/18             Core Components/Risk Factors/Patient Goals at Admission:  Personal Goals and Risk Factors at Admission - 07/01/22 0908       Core Components/Risk Factors/Patient Goals on Admission   Improve shortness of breath with ADL's Yes    Intervention Provide education, individualized exercise plan and daily activity instruction to help decrease symptoms of SOB with activities of daily living.    Expected Outcomes Short Term: Improve cardiorespiratory fitness to achieve a reduction of symptoms when performing ADLs;Long Term: Be able to perform more ADLs without symptoms or delay the onset of symptoms    Increase knowledge of respiratory medications and ability to use respiratory devices properly  Yes    Intervention Provide education and demonstration as needed of appropriate use of medications, inhalers, and oxygen therapy.    Expected Outcomes Short Term: Achieves understanding of medications use. Understands that oxygen is a medication prescribed by physician. Demonstrates appropriate use of inhaler and oxygen therapy.;Long Term: Maintain appropriate use of medications, inhalers, and oxygen therapy.    Personal Goal Other Yes    Personal Goal Improve leg strength and stamina.    Intervention Attend PR two days per week and begin a home exercise plan.    Expected Outcomes Pt will reach stated goals.             Core Components/Risk Factors/Patient Goals Review:   Goals and Risk Factor Review     Row Name 07/02/22 0849             Core Components/Risk Factors/Patient Goals Review   Personal Goals Review Improve shortness of breath  with ADL's;Other       Review Patient was referred to PR with COPD from [place]. He plans to strart the program tomorrow 07/03/22. His personal goals are to improve his strength and stamina  in his legs. We will continue to monitor his progress as he works towards meeting these goals.       Expected Outcomes Patient will complete the program meeting both personal and program goals.                Core Components/Risk Factors/Patient Goals at Discharge (Final Review):   Goals and Risk Factor Review - 07/02/22 0849       Core Components/Risk Factors/Patient Goals Review   Personal Goals Review Improve shortness of breath with ADL's;Other    Review Patient was referred to PR with COPD from [place]. He plans to strart the program tomorrow 07/03/22. His personal goals are to improve his strength and stamina in his legs. We will continue to monitor his progress as he works towards meeting these goals.    Expected Outcomes Patient will complete the program meeting both personal and program goals.             ITP Comments:   Comments: ITP REVIEW Pt is making expected progress toward pulmonary rehab goals after completing 3 sessions. Recommend continued exercise, life style modification, education, and utilization of breathing techniques to increase stamina and strength and decrease shortness of breath with exertion.

## 2022-07-09 NOTE — Progress Notes (Signed)
Pulmonary Individual Treatment Plan  Patient Details  Name: Dennis [name] MRN: [id number] Date of Birth: [birth year] Referring Provider:   Flowsheet Row PULMONARY REHAB COPD ORIENTATION from 07/01/2022 in [facility]  Referring Provider Dr. Melvyn Novas       Initial Encounter Date:  Flowsheet Row PULMONARY REHAB COPD ORIENTATION from 07/01/2022 in [facility]  Date 07/01/22       Visit Diagnosis: COPD mixed type ([place])  Patient's Home Medications on Admission:   Current Outpatient Medications:    albuterol (PROVENTIL) (2.5 MG/3ML) 0.083% nebulizer solution, Take 3 mLs (2.5 mg total) by nebulization every 6 (six) hours as needed for wheezing or shortness of breath., Disp: 150 mL, Rfl: 3   albuterol (VENTOLIN HFA) 108 (90 Base) MCG/ACT inhaler, Inhale 2 puffs into the lungs every 4 (four) hours as needed for wheezing., Disp: 18 g, Rfl: 5   ALPRAZolam (XANAX) 0.5 MG tablet, Take 1 tablet (0.5 mg total) by mouth 2 (two) times daily as needed for anxiety., Disp: 30 tablet, Rfl: 1   aspirin EC 81 MG tablet, Take 1 tablet (81 mg total) by mouth daily. (Patient taking differently: Take 81 mg by mouth 2 (two) times a week.), Disp: 30 tablet, Rfl: 11   budesonide-formoterol (SYMBICORT) 160-4.5 MCG/ACT inhaler, TAKE 2 PUFFS FIRST THING IN MORNING AND THEN ANOTHER 2 PUFFS ABOUT 12 HOURS LATER., Disp: 30.6 each, Rfl: 1   ezetimibe-simvastatin (VYTORIN) 10-40 MG tablet, TAKE 1 TABLET BY MOUTH AT BEDTIME. TAKE 1 TABLET BY MOUTH EVERYDAY AT BEDTIME, Disp: 90 tablet, Rfl: 1   fenofibrate 160 MG tablet, TAKE 1 TABLET BY MOUTH EVERY DAY, Disp: 90 tablet, Rfl: 1   fish oil-omega-3 fatty acids 1000 MG capsule, Take 1 g by mouth in the morning and at bedtime., Disp: , Rfl:    guaiFENesin (MUCINEX) 600 MG 12 hr tablet, Take 1 tablet (600 mg total) by mouth 2 (two) times daily., Disp: 20 tablet, Rfl: 0   losartan-hydrochlorothiazide (HYZAAR) 100-25 MG tablet, TAKE 1 TABLET BY  MOUTH EVERY DAY, Disp: 90 tablet, Rfl: 0   metoprolol tartrate (LOPRESSOR) 50 MG tablet, TAKE 1 TABLET BY MOUTH TWICE A DAY, Disp: 180 tablet, Rfl: 1   mupirocin ointment (BACTROBAN) 2 %, Apply 1 application topically 2 (two) times daily. (Patient taking differently: Apply 1 application  topically 2 (two) times daily as needed (wound care).), Disp: 22 g, Rfl: 0   nitroGLYCERIN (NITROSTAT) 0.4 MG SL tablet, PLACE 1 TABLET (0.4 MG TOTAL) UNDER THE TONGUE EVERY 5 (FIVE) MINUTES AS NEEDED., Disp: 25 tablet, Rfl: 3   Respiratory Therapy Supplies (NEBULIZER/TUBING/MOUTHPIECE) KIT, Nebulizer tubing and mouthpiece, Disp: 1 kit, Rfl: 0   tamsulosin (FLOMAX) 0.4 MG CAPS capsule, Take 1 capsule (0.4 mg total) by mouth daily., Disp: 90 capsule, Rfl: 3   Vitamin D, Ergocalciferol, (DRISDOL) 1.25 MG (50000 UNIT) CAPS capsule, Take 1 capsule (50,000 Units total) by mouth every 7 (seven) days., Disp: 12 capsule, Rfl: 1   zolpidem (AMBIEN) 10 MG tablet, Take 1 tablet (10 mg total) by mouth at bedtime as needed for sleep., Disp: 30 tablet, Rfl: 3  Past Medical History: Past Medical History:  Diagnosis Date   Atrial flutter ([place])    Diagnosed by ECG August 2015   Colitis    COPD (chronic obstructive pulmonary disease) ([place])    Coronary atherosclerosis of native coronary artery    Multivessel status post CABG   Essential hypertension    Hyperlipidemia    Insomnia  Tobacco Use: Social History   Tobacco Use  Smoking Status Former   Packs/day: 2.00   Years: 27.00   Total pack years: 54.00   Types: Cigarettes   Start date: 02/01/1971   Quit date: 01/31/1998   Years since quitting: 24.4  Smokeless Tobacco Current   Types: Chew    Labs: Review Flowsheet  More data exists      Latest Ref Rng & Units 07/23/2020 12/26/2020 07/03/2021 02/26/2022 07/08/2022  Labs for ITP Cardiac and Pulmonary Rehab  Cholestrol 100 - 199 mg/dL 806  484  758  - 262   LDL (calc) 0 - 99 mg/dL 634  730  747  - 627   HDL-C >39  mg/dL 43  42  43  - 43   Trlycerides 0 - 149 mg/dL 728  932  911  - 524   Hemoglobin A1c 4.8 - 5.6 % - 5.7  6.0  5.9  -    Capillary Blood Glucose: Lab Results  Component Value Date   GLUCAP 177 (H) 03/26/2020     Pulmonary Assessment Scores:  Pulmonary Assessment Scores     Row Name 07/01/22 0852         ADL UCSD   SOB Score total 40     Rest 1     Walk 2     Stairs 5     Bath 1     Dress 1     Shop 1       CAT Score   CAT Score 24       mMRC Score   mMRC Score 3             UCSD: Self-administered rating of dyspnea associated with activities of daily living (ADLs) 6-point scale (0 = "not at all" to 5 = "maximal or unable to do because of breathlessness")  Scoring Scores range from 0 to 120.  Minimally important difference is 5 units  CAT: CAT can identify the health impairment of COPD patients and is better correlated with disease progression.  CAT has a scoring range of zero to 40. The CAT score is classified into four groups of low (less than 10), medium (10 - 20), high (21-30) and very high (31-40) based on the impact level of disease on health status. A CAT score over 10 suggests significant symptoms.  A worsening CAT score could be explained by an exacerbation, poor medication adherence, poor inhaler technique, or progression of COPD or comorbid conditions.  CAT MCID is 2 points  mMRC: mMRC (Modified Medical Research Council) Dyspnea Scale is used to assess the degree of baseline functional disability in patients of respiratory disease due to dyspnea. No minimal important difference is established. A decrease in score of 1 point or greater is considered a positive change.   Pulmonary Function Assessment:   Exercise Target Goals: Exercise Program Goal: Individual exercise prescription set using results from initial 6 min walk test and THRR while considering  patient's activity barriers and safety.   Exercise Prescription Goal: Initial exercise  prescription builds to 30-45 minutes a day of aerobic activity, 2-3 days per week.  Home exercise guidelines will be given to patient during program as part of exercise prescription that the participant will acknowledge.  Activity Barriers & Risk Stratification:  Activity Barriers & Cardiac Risk Stratification - 07/01/22 0855       Activity Barriers & Cardiac Risk Stratification   Activity Barriers Other (comment)    Comments peripheral vascular disease  Cardiac Risk Stratification High             6 Minute Walk:  6 Minute Walk     Row Name 07/01/22 0947         6 Minute Walk   Phase Initial     Distance 1200 feet     Walk Time 6 minutes     # of Rest Breaks 0     MPH 2.27     METS 2.56     RPE 15     Perceived Dyspnea  11     VO2 Peak 8.96     Symptoms No     Resting HR 55 bpm     Resting BP 102/70     Resting Oxygen Saturation  93 %     Exercise Oxygen Saturation  during 6 min walk 93 %     Max Ex. HR 74 bpm     Max Ex. BP 138/70     2 Minute Post BP 120/68       Interval HR   1 Minute HR 62     2 Minute HR 64     3 Minute HR 65     4 Minute HR 70     5 Minute HR 70     6 Minute HR 74     2 Minute Post HR 61     Interval Heart Rate? Yes       Interval Oxygen   Interval Oxygen? Yes     Baseline Oxygen Saturation % 93 %     1 Minute Oxygen Saturation % 93 %     1 Minute Liters of Oxygen 0 L     2 Minute Oxygen Saturation % 93 %     2 Minute Liters of Oxygen 0 L     3 Minute Oxygen Saturation % 93 %     3 Minute Liters of Oxygen 0 L     4 Minute Oxygen Saturation % 93 %     4 Minute Liters of Oxygen 0 L     5 Minute Oxygen Saturation % 93 %     5 Minute Liters of Oxygen 0 L     6 Minute Oxygen Saturation % 93 %     6 Minute Liters of Oxygen 0 L     2 Minute Post Oxygen Saturation % 95 %     2 Minute Post Liters of Oxygen 0 L              Oxygen Initial Assessment:  Oxygen Initial Assessment - 07/01/22 0946       Home Oxygen   Home  Oxygen Device None    Sleep Oxygen Prescription None    Home Exercise Oxygen Prescription None    Home Resting Oxygen Prescription None    Compliance with Home Oxygen Use Yes      Initial 6 min Walk   Oxygen Used None      Program Oxygen Prescription   Program Oxygen Prescription None      Intervention   Short Term Goals To learn and understand importance of monitoring SPO2 with pulse oximeter and demonstrate accurate use of the pulse oximeter.;To learn and understand importance of maintaining oxygen saturations>88%;To learn and demonstrate proper pursed lip breathing techniques or other breathing techniques.     Long  Term Goals Verbalizes importance of monitoring SPO2 with pulse oximeter and return demonstration;Maintenance of O2 saturations>88%;Exhibits proper breathing techniques, such as pursed  lip breathing or other method taught during program session;Compliance with respiratory medication             Oxygen Re-Evaluation:  Oxygen Re-Evaluation     Row Name 07/08/22 1253             Program Oxygen Prescription   Program Oxygen Prescription None         Home Oxygen   Home Oxygen Device None       Sleep Oxygen Prescription None       Home Exercise Oxygen Prescription None       Home Resting Oxygen Prescription None       Compliance with Home Oxygen Use Yes         Goals/Expected Outcomes   Short Term Goals To learn and understand importance of monitoring SPO2 with pulse oximeter and demonstrate accurate use of the pulse oximeter.;To learn and understand importance of maintaining oxygen saturations>88%;To learn and demonstrate proper pursed lip breathing techniques or other breathing techniques.        Long  Term Goals Verbalizes importance of monitoring SPO2 with pulse oximeter and return demonstration;Maintenance of O2 saturations>88%;Exhibits proper breathing techniques, such as pursed lip breathing or other method taught during program session;Compliance with  respiratory medication       Goals/Expected Outcomes Compliance                Oxygen Discharge (Final Oxygen Re-Evaluation):  Oxygen Re-Evaluation - 07/08/22 1253       Program Oxygen Prescription   Program Oxygen Prescription None      Home Oxygen   Home Oxygen Device None    Sleep Oxygen Prescription None    Home Exercise Oxygen Prescription None    Home Resting Oxygen Prescription None    Compliance with Home Oxygen Use Yes      Goals/Expected Outcomes   Short Term Goals To learn and understand importance of monitoring SPO2 with pulse oximeter and demonstrate accurate use of the pulse oximeter.;To learn and understand importance of maintaining oxygen saturations>88%;To learn and demonstrate proper pursed lip breathing techniques or other breathing techniques.     Long  Term Goals Verbalizes importance of monitoring SPO2 with pulse oximeter and return demonstration;Maintenance of O2 saturations>88%;Exhibits proper breathing techniques, such as pursed lip breathing or other method taught during program session;Compliance with respiratory medication    Goals/Expected Outcomes Compliance             Initial Exercise Prescription:  Initial Exercise Prescription - 07/01/22 0900       Date of Initial Exercise RX and Referring Provider   Date 07/01/22    Referring Provider Dr. Melvyn Novas    Expected Discharge Date 11/04/22      Recumbant Elliptical   Level 1    RPM 45    Minutes 39      Prescription Details   Frequency (times per week) 2    Duration Progress to 30 minutes of continuous aerobic without signs/symptoms of physical distress      Intensity   THRR 40-80% of Max Heartrate 60-121    Ratings of Perceived Exertion 11-13    Perceived Dyspnea 0-4      Resistance Training   Training Prescription Yes    Weight 4    Reps 10-15             Perform Capillary Blood Glucose checks as needed.  Exercise Prescription Changes:   Exercise Prescription Changes      Row Name 07/08/22 1200  Response to Exercise   Blood Pressure (Admit) 126/70       Blood Pressure (Exercise) 124/70       Blood Pressure (Exit) 108/68       Heart Rate (Admit) 60 bpm       Heart Rate (Exercise) 66 bpm       Heart Rate (Exit) 65 bpm       Oxygen Saturation (Admit) 92 %       Oxygen Saturation (Exercise) 93 %       Oxygen Saturation (Exit) 95 %       Rating of Perceived Exertion (Exercise) 12       Perceived Dyspnea (Exercise) 12       Duration Continue with 30 min of aerobic exercise without signs/symptoms of physical distress.       Intensity THRR unchanged         Progression   Progression Continue to progress workloads to maintain intensity without signs/symptoms of physical distress.         Resistance Training   Training Prescription Yes       Weight 5       Reps 10-15       Time 10 Minutes         Recumbant Elliptical   Level 1       RPM 55       Minutes 39       METs 3                Exercise Comments:   Exercise Goals and Review:   Exercise Goals     Row Name 07/01/22 0953 07/08/22 1250           Exercise Goals   Increase Physical Activity Yes Yes      Intervention Provide advice, education, support and counseling about physical activity/exercise needs.;Develop an individualized exercise prescription for aerobic and resistive training based on initial evaluation findings, risk stratification, comorbidities and participant's personal goals. Provide advice, education, support and counseling about physical activity/exercise needs.;Develop an individualized exercise prescription for aerobic and resistive training based on initial evaluation findings, risk stratification, comorbidities and participant's personal goals.      Expected Outcomes Short Term: Attend rehab on a regular basis to increase amount of physical activity.;Long Term: Add in home exercise to make exercise part of routine and to increase amount of physical  activity.;Long Term: Exercising regularly at least 3-5 days a week. Short Term: Attend rehab on a regular basis to increase amount of physical activity.;Long Term: Add in home exercise to make exercise part of routine and to increase amount of physical activity.;Long Term: Exercising regularly at least 3-5 days a week.      Increase Strength and Stamina Yes Yes      Intervention Provide advice, education, support and counseling about physical activity/exercise needs.;Develop an individualized exercise prescription for aerobic and resistive training based on initial evaluation findings, risk stratification, comorbidities and participant's personal goals. Provide advice, education, support and counseling about physical activity/exercise needs.;Develop an individualized exercise prescription for aerobic and resistive training based on initial evaluation findings, risk stratification, comorbidities and participant's personal goals.      Expected Outcomes Short Term: Increase workloads from initial exercise prescription for resistance, speed, and METs.;Short Term: Perform resistance training exercises routinely during rehab and add in resistance training at home;Long Term: Improve cardiorespiratory fitness, muscular endurance and strength as measured by increased METs and functional capacity (6MWT) Short Term: Increase workloads from initial exercise prescription  for resistance, speed, and METs.;Short Term: Perform resistance training exercises routinely during rehab and add in resistance training at home;Long Term: Improve cardiorespiratory fitness, muscular endurance and strength as measured by increased METs and functional capacity (6MWT)      Able to understand and use rate of perceived exertion (RPE) scale Yes Yes      Intervention Provide education and explanation on how to use RPE scale Provide education and explanation on how to use RPE scale      Expected Outcomes Short Term: Able to use RPE daily in rehab  to express subjective intensity level;Long Term:  Able to use RPE to guide intensity level when exercising independently Short Term: Able to use RPE daily in rehab to express subjective intensity level;Long Term:  Able to use RPE to guide intensity level when exercising independently      Able to understand and use Dyspnea scale Yes Yes      Intervention Provide education and explanation on how to use Dyspnea scale Provide education and explanation on how to use Dyspnea scale      Expected Outcomes Short Term: Able to use Dyspnea scale daily in rehab to express subjective sense of shortness of breath during exertion;Long Term: Able to use Dyspnea scale to guide intensity level when exercising independently Short Term: Able to use Dyspnea scale daily in rehab to express subjective sense of shortness of breath during exertion;Long Term: Able to use Dyspnea scale to guide intensity level when exercising independently      Knowledge and understanding of Target Heart Rate Range (THRR) Yes --      Intervention Provide education and explanation of THRR including how the numbers were predicted and where they are located for reference Provide education and explanation of THRR including how the numbers were predicted and where they are located for reference      Expected Outcomes Short Term: Able to state/look up THRR;Short Term: Able to use daily as guideline for intensity in rehab;Long Term: Able to use THRR to govern intensity when exercising independently Short Term: Able to state/look up THRR;Short Term: Able to use daily as guideline for intensity in rehab;Long Term: Able to use THRR to govern intensity when exercising independently      Understanding of Exercise Prescription Yes Yes      Intervention Provide education, explanation, and written materials on patient's individual exercise prescription Provide education, explanation, and written materials on patient's individual exercise prescription      Expected  Outcomes Short Term: Able to explain program exercise prescription;Long Term: Able to explain home exercise prescription to exercise independently Short Term: Able to explain program exercise prescription;Long Term: Able to explain home exercise prescription to exercise independently               Exercise Goals Re-Evaluation :  Exercise Goals Re-Evaluation     Row Name 07/08/22 1251             Exercise Goal Re-Evaluation   Exercise Goals Review Increase Physical Activity;Increase Strength and Stamina;Able to understand and use rate of perceived exertion (RPE) scale;Able to understand and use Dyspnea scale;Able to check pulse independently;Understanding of Exercise Prescription       Comments Pt has completed 2 sessions of PR. He is motivated during class and focused on increasing his distance on each class. He is currently exercising at 3.0 METs on the ellp. Will continue to monitor and progress as able.       Expected Outcomes Through exercise at  rehab and at home, the patient will meet their stated goals.                Discharge Exercise Prescription (Final Exercise Prescription Changes):  Exercise Prescription Changes - 07/08/22 1200       Response to Exercise   Blood Pressure (Admit) 126/70    Blood Pressure (Exercise) 124/70    Blood Pressure (Exit) 108/68    Heart Rate (Admit) 60 bpm    Heart Rate (Exercise) 66 bpm    Heart Rate (Exit) 65 bpm    Oxygen Saturation (Admit) 92 %    Oxygen Saturation (Exercise) 93 %    Oxygen Saturation (Exit) 95 %    Rating of Perceived Exertion (Exercise) 12    Perceived Dyspnea (Exercise) 12    Duration Continue with 30 min of aerobic exercise without signs/symptoms of physical distress.    Intensity THRR unchanged      Progression   Progression Continue to progress workloads to maintain intensity without signs/symptoms of physical distress.      Resistance Training   Training Prescription Yes    Weight 5    Reps 10-15     Time 10 Minutes      Recumbant Elliptical   Level 1    RPM 55    Minutes 39    METs 3             Nutrition:  Target Goals: Understanding of nutrition guidelines, daily intake of sodium '1500mg'$ , cholesterol '200mg'$ , calories 30% from fat and 7% or less from saturated fats, daily to have 5 or more servings of fruits and vegetables.  Biometrics:  Pre Biometrics - 07/01/22 0953       Pre Biometrics   Height $Remov'6\' 1"'WyacRn$  (1.854 m)    Weight 99.5 kg    Waist Circumference 44 inches    Hip Circumference 43 inches    Waist to Hip Ratio 1.02 %    BMI (Calculated) 28.95    Triceps Skinfold 20 mm    % Body Fat 30.6 %    Grip Strength 45 kg    Flexibility 0 in    Single Leg Stand 4.35 seconds              Nutrition Therapy Plan and Nutrition Goals:  Nutrition Therapy & Goals - 07/02/22 0848       Personal Nutrition Goals   Comments Patient scored 57 on his diet assessment. We offer 2 educational sessions on heart healthy nutrition with handouts.      Intervention Plan   Intervention Nutrition handout(s) given to patient.    Expected Outcomes Short Term Goal: Understand basic principles of dietary content, such as calories, fat, sodium, cholesterol and nutrients.             Nutrition Assessments:  Nutrition Assessments - 07/01/22 0902       MEDFICTS Scores   Pre Score 57            MEDIFICTS Score Key: ?70 Need to make dietary changes  40-70 Heart Healthy Diet ? 40 Therapeutic Level Cholesterol Diet   Picture Your Plate Scores: <83 Unhealthy dietary pattern with much room for improvement. 41-50 Dietary pattern unlikely to meet recommendations for good health and room for improvement. 51-60 More healthful dietary pattern, with some room for improvement.  >60 Healthy dietary pattern, although there may be some specific behaviors that could be improved.    Nutrition Goals Re-Evaluation:   Nutrition Goals Discharge (Final Nutrition  Goals  Re-Evaluation):   Psychosocial: Target Goals: Acknowledge presence or absence of significant depression and/or stress, maximize coping skills, provide positive support system. Participant is able to verbalize types and ability to use techniques and skills needed for reducing stress and depression.  Initial Review & Psychosocial Screening:  Initial Psych Review & Screening - 07/01/22 0859       Initial Review   Current issues with Current Sleep Concerns;Current Anxiety/Panic      Family Dynamics   Good Support System? Yes    Comments His support system includes his wife, sons, and grand children.      Barriers   Psychosocial barriers to participate in program There are no identifiable barriers or psychosocial needs.      Screening Interventions   Interventions Encouraged to exercise    Expected Outcomes Long Term goal: The participant improves quality of Life and PHQ9 Scores as seen by post scores and/or verbalization of changes;Short Term goal: Identification and review with participant of any Quality of Life or Depression concerns found by scoring the questionnaire.             Quality of Life Scores:  Quality of Life - 07/01/22 0954       Quality of Life   Select Quality of Life      Quality of Life Scores   Health/Function Pre 17.13 %    Socioeconomic Pre 21.75 %    Psych/Spiritual Pre 19.93 %    Family Pre 26.4 %    GLOBAL Pre 19.97 %            Scores of 19 and below usually indicate a poorer quality of life in these areas.  A difference of  2-3 points is a clinically meaningful difference.  A difference of 2-3 points in the total score of the Quality of Life Index has been associated with significant improvement in overall quality of life, self-image, physical symptoms, and general health in studies assessing change in quality of life.   PHQ-9: Review Flowsheet  More data exists      07/01/2022 06/09/2022 04/02/2022 03/24/2022 03/13/2022  Depression screen PHQ  2/9  Decreased Interest 3 0 0 0 0  Down, Depressed, Hopeless 1 0 0 0 0  PHQ - 2 Score 4 0 0 0 0  Altered sleeping 3 - - - -  Tired, decreased energy 3 - - - -  Change in appetite 1 - - - -  Feeling bad or failure about yourself  1 - - - -  Trouble concentrating 1 - - - -  Moving slowly or fidgety/restless 3 - - - -  Suicidal thoughts 0 - - - -  PHQ-9 Score 16 - - - -  Difficult doing work/chores Very difficult - - - -   Interpretation of Total Score  Total Score Depression Severity:  1-4 = Minimal depression, 5-9 = Mild depression, 10-14 = Moderate depression, 15-19 = Moderately severe depression, 20-27 = Severe depression   Psychosocial Evaluation and Intervention:  Psychosocial Evaluation - 07/01/22 0937       Psychosocial Evaluation & Interventions   Interventions Encouraged to exercise with the program and follow exercise prescription    Comments Pt has to participate in PR. He does have current anxiety and sleep concerns. He reports that he takes xanax PRN on days when he is "really on edge", but this is only about once per week. He states that a months supply of xanax lasts him 6-8 months. He  does take ambien for his sleep, and he reports that this helps some, but he is unable to stay asleep for more than 2 hours at a time. 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 He scored a 16 on his PHQ-9, and he relates most of this to his peripheral vascular disease in his legs and his lack of sleep. He is no longer able to walk long distances due to his leg pains and fatigue. He denies have depression or any thoughs of harming himself. He reports that he has a good support system with his wife, 3 sons, and 10 grandchildren. His goals while in the program are to improve his leg strength and stamina, so that he can be able to walk longer distances again. He has participated in the PR and CR programs before  and reported that he benefited from them, so he is eager to start the program again.    Expected Outcomes Pt's anxiety and sleep concerns will continue to be treated, and he will have no other identifiable psychosocial issues.    Continue Psychosocial Services  No Follow up required             Psychosocial Re-Evaluation:  Psychosocial Re-Evaluation     [place] Name 07/02/22 [phone]             Psychosocial Re-Evaluation   Current issues with Current Anxiety/Panic;Current Sleep Concerns       Comments Patient is new to the program. He plans to start tomorrow 8/10. We will continue to monitor his progress in the program.       Expected Outcomes Patient will continue to have no psychosocial barriers and his anxiety and sleep will continue to be managed with Alprazolam and Ambien prn.       Interventions Encouraged to attend Pulmonary Rehabilitation for the exercise;Relaxation education;Stress management education       Continue Psychosocial Services  No Follow up required                Psychosocial Discharge (Final Psychosocial Re-Evaluation):  Psychosocial Re-Evaluation - 07/02/22 0938       Psychosocial Re-Evaluation   Current issues with Current Anxiety/Panic;Current Sleep Concerns    Comments Patient is new to the program. He plans to start tomorrow 8/10. We will continue to monitor his progress in the program.    Expected Outcomes Patient will continue to have no psychosocial barriers and his anxiety and sleep will continue to be managed with Alprazolam and Ambien prn.    Interventions Encouraged to attend Pulmonary Rehabilitation for the exercise;Relaxation education;Stress management education    Continue Psychosocial Services  No Follow up required              Education: Education Goals: Education classes will be provided on a weekly basis, covering required topics. Participant will state understanding/return demonstration of topics presented.  Learning  Barriers/Preferences:  Learning Barriers/Preferences - 07/01/22 1829       Learning Barriers/Preferences   Learning Barriers None    Learning Preferences Skilled Demonstration;Individual Instruction             Education Topics: How Lungs Work and Diseases: - Discuss the anatomy of the lungs and diseases that can affect the lungs, such as COPD.   Exercise: -Discuss the importance of exercise, FITT principles of exercise, normal and abnormal responses to exercise, and how to exercise safely.   Environmental  Irritants: -Discuss types of environmental irritants and how to limit exposure to environmental irritants.   Meds/Inhalers and oxygen: - Discuss respiratory medications, definition of an inhaler and oxygen, and the proper way to use an inhaler and oxygen.   Energy Saving Techniques: - Discuss methods to conserve energy and decrease shortness of breath when performing activities of daily living.    Bronchial Hygiene / Breathing Techniques: - Discuss breathing mechanics, pursed-lip breathing technique,  proper posture, effective ways to clear airways, and other functional breathing techniques   Cleaning Equipment: - Provides group verbal and written instruction about the health risks of elevated stress, cause of high stress, and healthy ways to reduce stress. Flowsheet Row PULMONARY REHAB CHRONIC OBSTRUCTIVE PULMONARY DISEASE from 07/03/2022 in [place] [facility]  Date 07/03/22  Educator DF  Instruction Review Code 2- Demonstrated Understanding       Nutrition I: Fats: - Discuss the types of cholesterol, what cholesterol does to the body, and how cholesterol levels can be controlled.   Nutrition II: Labels: -Discuss the different components of food labels and how to read food labels.   Respiratory Infections: - Discuss the signs and symptoms of respiratory infections, ways to prevent respiratory infections, and the importance of seeking medical  treatment when having a respiratory infection.   Stress I: Signs and Symptoms: - Discuss the causes of stress, how stress may lead to anxiety and depression, and ways to limit stress.   Stress II: Relaxation: -Discuss relaxation techniques to limit stress.   Oxygen for Home/Travel: - Discuss how to prepare for travel when on oxygen and proper ways to transport and store oxygen to ensure safety.   Knowledge Questionnaire Score:  Knowledge Questionnaire Score - 07/01/22 0905       Knowledge Questionnaire Score   Pre Score 15/18             Core Components/Risk Factors/Patient Goals at Admission:  Personal Goals and Risk Factors at Admission - 07/01/22 0908       Core Components/Risk Factors/Patient Goals on Admission   Improve shortness of breath with ADL's Yes    Intervention Provide education, individualized exercise plan and daily activity instruction to help decrease symptoms of SOB with activities of daily living.    Expected Outcomes Short Term: Improve cardiorespiratory fitness to achieve a reduction of symptoms when performing ADLs;Long Term: Be able to perform more ADLs without symptoms or delay the onset of symptoms    Increase knowledge of respiratory medications and ability to use respiratory devices properly  Yes    Intervention Provide education and demonstration as needed of appropriate use of medications, inhalers, and oxygen therapy.    Expected Outcomes Short Term: Achieves understanding of medications use. Understands that oxygen is a medication prescribed by physician. Demonstrates appropriate use of inhaler and oxygen therapy.;Long Term: Maintain appropriate use of medications, inhalers, and oxygen therapy.    Personal Goal Other Yes    Personal Goal Improve leg strength and stamina.    Intervention Attend PR two days per week and begin a home exercise plan.    Expected Outcomes Pt will reach stated goals.             Core Components/Risk  Factors/Patient Goals Review:   Goals and Risk Factor Review     Row Name 07/02/22 0849             Core Components/Risk Factors/Patient Goals Review   Personal Goals Review Improve shortness of breath with ADL's;Other  Review Patient was referred to PR with COPD from [place]. He plans to strart the program tomorrow 07/03/22. His personal goals are to improve his strength and stamina in his legs. We will continue to monitor his progress as he works towards meeting these goals.       Expected Outcomes Patient will complete the program meeting both personal and program goals.                Core Components/Risk Factors/Patient Goals at Discharge (Final Review):   Goals and Risk Factor Review - 07/02/22 0849       Core Components/Risk Factors/Patient Goals Review   Personal Goals Review Improve shortness of breath with ADL's;Other    Review Patient was referred to PR with COPD from [place]. He plans to strart the program tomorrow 07/03/22. His personal goals are to improve his strength and stamina in his legs. We will continue to monitor his progress as he works towards meeting these goals.    Expected Outcomes Patient will complete the program meeting both personal and program goals.             ITP Comments:   Comments: ITP REVIEW Pt is making expected progress toward pulmonary rehab goals after completing 3 sessions. Recommend continued exercise, life style modification, education, and utilization of breathing techniques to increase stamina and strength and decrease shortness of breath with exertion.

## 2022-07-10 ENCOUNTER — Encounter (HOSPITAL_COMMUNITY)
Admission: RE | Admit: 2022-07-10 | Discharge: 2022-07-10 | Disposition: A | Discharge status: Home / Self Care | Payer: Medicare Other | Source: Ambulatory Visit | Attending: Internal Medicine | Admitting: Internal Medicine

## 2022-07-10 DIAGNOSIS — J449 Chronic obstructive pulmonary disease, unspecified: Secondary | ICD-10-CM | POA: Diagnosis not present

## 2022-07-10 LAB — MICROALBUMIN / CREATININE URINE RATIO
Creatinine, Urine: 54.8 mg/dL
Microalb/Creat Ratio: <5 mg/g creat (ref 0–29)
Microalbumin, Urine: <3 ug/mL

## 2022-07-10 NOTE — Progress Notes (Signed)
Daily Session Note  Patient Details  Name: Dennis [name] MRN: [id number] Date of Birth: Jan 03, 1951 Referring Provider:   Flowsheet Row PULMONARY REHAB COPD ORIENTATION from 07/01/2022 in [place]  Referring Provider Dr. Melvyn Novas       Encounter Date: 07/10/2022  Check In:  Session Check In - 07/10/22 1043       Check-In   Supervising physician immediately available to respond to emergencies [facility] MD immediately available    Physician(s) Johnsie Cancel    Location [facility]    Staff Present Redge Gainer, BS, Exercise Physiologist;Phyllis Billingsley, Kermit Balo, RN, BSN    Virtual Visit No    Medication changes reported     No    Fall or balance concerns reported    No    Tobacco Cessation No Change    Warm-up and Cool-down Performed as group-led instruction    Resistance Training Performed Yes    VAD Patient? No    PAD/SET Patient? No      Pain Assessment   Currently in Pain? No/denies    Multiple Pain Sites No             Capillary Blood Glucose: No results found for this or any previous visit (from the past 24 hour(s)).    Social History   Tobacco Use  Smoking Status Former   Packs/day: 2.00   Years: 27.00   Total pack years: 54.00   Types: Cigarettes   Start date: 02/01/1971   Quit date: 01/31/1998   Years since quitting: 24.4  Smokeless Tobacco Current   Types: Chew    Goals Met:  Proper associated with RPD/PD & O2 Sat Independence with exercise equipment Using PLB without cueing & demonstrates good technique Exercise tolerated well No report of concerns or symptoms today Strength training completed today  Goals Unmet:  Not Applicable  Comments: Check out 1145.   Dr. Kathie Dike is Medical Director for [facility] Pulmonary [facility].

## 2022-07-15 ENCOUNTER — Ambulatory Visit: Payer: Medicare Other | Admitting: Internal Medicine

## 2022-07-15 ENCOUNTER — Telehealth: Payer: Self-pay | Admitting: Internal Medicine

## 2022-07-15 ENCOUNTER — Encounter (HOSPITAL_COMMUNITY)
Admission: RE | Admit: 2022-07-15 | Discharge: 2022-07-15 | Disposition: A | Discharge status: Home / Self Care | Payer: Medicare Other | Source: Ambulatory Visit | Attending: Internal Medicine | Admitting: Internal Medicine

## 2022-07-15 DIAGNOSIS — J449 Chronic obstructive pulmonary disease, unspecified: Secondary | ICD-10-CM | POA: Diagnosis not present

## 2022-07-15 NOTE — Telephone Encounter (Signed)
Pt came by office wanting to know if you can please call him with his lab results. He is also wanting to pick up a copy as well.

## 2022-07-15 NOTE — Progress Notes (Signed)
Daily Session Note  Patient Details  Name: Dennis [name] MRN: [id number] Date of Birth: 12-30-1950 Referring Provider:   Flowsheet Row PULMONARY REHAB COPD ORIENTATION from 07/01/2022 in [place]  Referring Provider Dr. Melvyn Novas       Encounter Date: 07/15/2022  Check In:  Session Check In - 07/15/22 1040       Check-In   Supervising physician immediately available to respond to emergencies [facility] MD immediately available    Physician(s) Dr Harl Bowie    Location [facility]    Staff Present Redge Gainer, BS, Exercise Physiologist;Janai Brannigan Hassell Done, RN, Bjorn Loser, MS, ACSM-CEP, Exercise Physiologist    Virtual Visit No    Medication changes reported     No    Fall or balance concerns reported    No    Tobacco Cessation No Change    Warm-up and Cool-down Performed as group-led instruction    Resistance Training Performed Yes    VAD Patient? No    PAD/SET Patient? No      Pain Assessment   Currently in Pain? No/denies    Multiple Pain Sites No             Capillary Blood Glucose: No results found for this or any previous visit (from the past 24 hour(s)).    Social History   Tobacco Use  Smoking Status Former   Packs/day: 2.00   Years: 27.00   Total pack years: 54.00   Types: Cigarettes   Start date: 02/01/1971   Quit date: 01/31/1998   Years since quitting: 24.4  Smokeless Tobacco Current   Types: Chew    Goals Met:  Proper associated with RPD/PD & O2 Sat Independence with exercise equipment Using PLB without cueing & demonstrates good technique Exercise tolerated well Queuing for purse lip breathing No report of concerns or symptoms today Strength training completed today  Goals Unmet:  Not Applicable  Comments: Checkout at 1145.   Dr. Kathie Dike is Medical Director for [facility] Pulmonary Rehab.

## 2022-07-16 NOTE — Telephone Encounter (Signed)
Patient aware.

## 2022-07-17 ENCOUNTER — Telehealth: Payer: Self-pay | Admitting: Internal Medicine

## 2022-07-17 ENCOUNTER — Encounter (HOSPITAL_COMMUNITY)
Admission: RE | Admit: 2022-07-17 | Discharge: 2022-07-17 | Disposition: A | Discharge status: Home / Self Care | Payer: Medicare Other | Source: Ambulatory Visit | Attending: Internal Medicine | Admitting: Internal Medicine

## 2022-07-17 ENCOUNTER — Telehealth: Payer: Self-pay

## 2022-07-17 DIAGNOSIS — J449 Chronic obstructive pulmonary disease, unspecified: Secondary | ICD-10-CM | POA: Diagnosis not present

## 2022-07-17 NOTE — Telephone Encounter (Signed)
Pt called back stating that he has received the copy of his labs from elsewhere and that had the info from the labs from here attached to it. States he does not need to be call back.

## 2022-07-17 NOTE — Telephone Encounter (Signed)
Per patient needs ALL labs   08.15.2023 07.17.2023

## 2022-07-17 NOTE — Progress Notes (Signed)
Daily Session Note  Patient Details  Name: Dennis [name] MRN: [id number] Date of Birth: 1951-01-28 Referring Provider:   Flowsheet Row PULMONARY REHAB COPD ORIENTATION from 07/01/2022 in [place]  Referring Provider Dr. Melvyn Novas       Encounter Date: 07/17/2022  Check In:  Session Check In - 07/17/22 1045       Check-In   Supervising physician immediately available to respond to emergencies [facility] MD immediately available    Physician(s) Dr Harl Bowie    Location [facility]    Staff Present Redge Gainer, BS, Exercise Physiologist;Gianny Sabino Hassell Done, RN, BSN    Virtual Visit No    Medication changes reported     No    Fall or balance concerns reported    No    Tobacco Cessation No Change    Warm-up and Cool-down Performed as group-led instruction    Resistance Training Performed Yes    VAD Patient? No    PAD/SET Patient? No      Pain Assessment   Currently in Pain? No/denies    Multiple Pain Sites No             Capillary Blood Glucose: No results found for this or any previous visit (from the past 24 hour(s)).    Social History   Tobacco Use  Smoking Status Former   Packs/day: 2.00   Years: 27.00   Total pack years: 54.00   Types: Cigarettes   Start date: 02/01/1971   Quit date: 01/31/1998   Years since quitting: 24.4  Smokeless Tobacco Current   Types: Chew    Goals Met:  Proper associated with RPD/PD & O2 Sat Independence with exercise equipment Using PLB without cueing & demonstrates good technique Exercise tolerated well Queuing for purse lip breathing No report of concerns or symptoms today Strength training completed today  Goals Unmet:  Not Applicable  Comments: Checkout at 1145.   Dr. Kathie Dike is Medical Director for [facility] Pulmonary Re[facility].

## 2022-07-17 NOTE — Telephone Encounter (Signed)
Patient came back to the office need blood work 08.14.2023 and lastest (PATIENT WANTS ALL BLOODWORK)  What patient picked up was not all his bloodwork.  Any questions call patient [phone].

## 2022-07-17 NOTE — Telephone Encounter (Signed)
noted 

## 2022-07-17 NOTE — Telephone Encounter (Signed)
Please see updated telephone message

## 2022-07-22 ENCOUNTER — Encounter (HOSPITAL_COMMUNITY)
Admission: RE | Admit: 2022-07-22 | Discharge: 2022-07-22 | Disposition: A | Discharge status: Home / Self Care | Payer: Medicare Other | Source: Ambulatory Visit | Attending: Internal Medicine | Admitting: Internal Medicine

## 2022-07-22 VITALS — Wt 219.1 lb

## 2022-07-22 DIAGNOSIS — J449 Chronic obstructive pulmonary disease, unspecified: Secondary | ICD-10-CM | POA: Diagnosis not present

## 2022-07-22 NOTE — Progress Notes (Signed)
Daily Session Note  Patient Details  Name: Dennis [name] MRN: [id number] Date of Birth: [birth year] Referring Provider:   Flowsheet Row PULMONARY REHAB COPD ORIENTATION from 07/01/2022 in [place]  Referring Provider Dr. Melvyn Novas       Encounter Date: 07/22/2022  Check In:  Session Check In - 07/22/22 1045       Check-In   Supervising physician immediately available to respond to emergencies [facility] MD immediately available    Physician(s) Dr Gardiner Rhyme    Staff Present Redge Gainer, BS, Exercise Physiologist;Bronc Brosseau Hassell Done, RN, BSN    Virtual Visit No    Medication changes reported     No    Fall or balance concerns reported    No    Tobacco Cessation No Change    Warm-up and Cool-down Performed as group-led instruction    Resistance Training Performed Yes    VAD Patient? No    PAD/SET Patient? No      Pain Assessment   Currently in Pain? No/denies    Multiple Pain Sites No             Capillary Blood Glucose: No results found for this or any previous visit (from the past 24 hour(s)).    Social History   Tobacco Use  Smoking Status Former   Packs/day: 2.00   Years: 27.00   Total pack years: 54.00   Types: Cigarettes   Start date: 02/01/1971   Quit date: 01/31/1998   Years since quitting: 24.4  Smokeless Tobacco Current   Types: Chew    Goals Met:  Proper associated with RPD/PD & O2 Sat Independence with exercise equipment Using PLB without cueing & demonstrates good technique Exercise tolerated well Queuing for purse lip breathing No report of concerns or symptoms today Strength training completed today  Goals Unmet:  Not Applicable  Comments: Checkout at 1145.   Dr. Kathie Dike is Medical Director for [facility] Re[facility].

## 2022-07-24 ENCOUNTER — Other Ambulatory Visit: Payer: Self-pay | Admitting: Internal Medicine

## 2022-07-24 ENCOUNTER — Encounter (HOSPITAL_COMMUNITY)
Admission: RE | Admit: 2022-07-24 | Discharge: 2022-07-24 | Disposition: A | Discharge status: Home / Self Care | Payer: Medicare Other | Source: Ambulatory Visit | Attending: Internal Medicine | Admitting: Internal Medicine

## 2022-07-24 DIAGNOSIS — J449 Chronic obstructive pulmonary disease, unspecified: Secondary | ICD-10-CM | POA: Diagnosis not present

## 2022-07-24 DIAGNOSIS — I10 Essential (primary) hypertension: Secondary | ICD-10-CM

## 2022-07-24 NOTE — Progress Notes (Signed)
Daily Session Note  Patient Details  Name: GEORGE HAGGART MRN: [id number] Date of Birth: August 28, 1951 Referring Provider:   Flowsheet Row PULMONARY REHAB COPD ORIENTATION from 07/01/2022 in [place]  Referring Provider Dr. Melvyn Novas       Encounter Date: 07/24/2022  Check In:  Session Check In - 07/24/22 1043       Check-In   Supervising physician immediately available to respond to emergencies [facility] MD immediately available    Physician(s) Dr Domenic Polite    Location [facility]    Staff Present Geanie Cooley, RN;Abubakr Wieman Hassell Done, RN, Bjorn Loser, MS, ACSM-CEP, Exercise Physiologist    Virtual Visit No    Medication changes reported     No    Fall or balance concerns reported    No    Tobacco Cessation No Change    Warm-up and Cool-down Performed as group-led instruction    Resistance Training Performed Yes    VAD Patient? No    PAD/SET Patient? No      Pain Assessment   Currently in Pain? No/denies    Multiple Pain Sites No             Capillary Blood Glucose: No results found for this or any previous visit (from the past 24 hour(s)).    Social History   Tobacco Use  Smoking Status Former   Packs/day: 2.00   Years: 27.00   Total pack years: 54.00   Types: Cigarettes   Start date: 02/01/1971   Quit date: 01/31/1998   Years since quitting: 24.4  Smokeless Tobacco Current   Types: Chew    Goals Met:  Proper associated with RPD/PD & O2 Sat Independence with exercise equipment Using PLB without cueing & demonstrates good technique Exercise tolerated well Queuing for purse lip breathing No report of concerns or symptoms today Strength training completed today  Goals Unmet:  Not Applicable  Comments: Checkout at 1145.   Dr. Kathie Dike is Medical Director for [facility] Pulmonary Rehab.

## 2022-07-29 ENCOUNTER — Encounter (HOSPITAL_COMMUNITY): Payer: Medicare Other

## 2022-07-31 ENCOUNTER — Other Ambulatory Visit: Payer: Self-pay | Admitting: *Deleted

## 2022-07-31 ENCOUNTER — Encounter (HOSPITAL_COMMUNITY)
Admission: RE | Admit: 2022-07-31 | Discharge: 2022-07-31 | Disposition: A | Discharge status: Home / Self Care | Payer: Medicare Other | Source: Ambulatory Visit | Attending: Internal Medicine | Admitting: Internal Medicine

## 2022-07-31 DIAGNOSIS — J449 Chronic obstructive pulmonary disease, unspecified: Secondary | ICD-10-CM | POA: Insufficient documentation

## 2022-07-31 DIAGNOSIS — I739 Peripheral vascular disease, unspecified: Secondary | ICD-10-CM

## 2022-07-31 NOTE — Progress Notes (Signed)
Daily Session Note  Patient Details  Name: Dennis [name] MRN: [id number] Date of Birth: [birth year] Referring Provider:   Flowsheet Row PULMONARY REHAB COPD ORIENTATION from 07/01/2022 in [place]  Referring Provider Dr. Melvyn Novas       Encounter Date: 07/31/2022  Check In:  Session Check In - 07/31/22 1045       Check-In   Supervising physician immediately available to respond to emergencies [facility] MD immediately available    Physician(s) Dr. Harl Bowie    Location [facility]    Staff Present Redge Gainer, BS, Exercise Physiologist;Yousof Alderman Wynetta Emery, RN, Joanette Gula, RN, BSN    Virtual Visit No    Medication changes reported     No    Fall or balance concerns reported    No    Tobacco Cessation No Change    Warm-up and Cool-down Performed as group-led instruction    Resistance Training Performed Yes    VAD Patient? No    PAD/SET Patient? No      Pain Assessment   Currently in Pain? No/denies    Multiple Pain Sites No             Capillary Blood Glucose: No results found for this or any previous visit (from the past 24 hour(s)).    Social History   Tobacco Use  Smoking Status Former   Packs/day: 2.00   Years: 27.00   Total pack years: 54.00   Types: Cigarettes   Start date: 02/01/1971   Quit date: 01/31/1998   Years since quitting: 24.5  Smokeless Tobacco Current   Types: Chew    Goals Met:  Proper associated with RPD/PD & O2 Sat Independence with exercise equipment Using PLB without cueing & demonstrates good technique Exercise tolerated well No report of concerns or symptoms today Strength training completed today  Goals Unmet:  Not Applicable  Comments: Check out 1145.   Dr. Kathie Dike is Medical Director for [facility] Pulmonary Rehab.

## 2022-08-05 ENCOUNTER — Encounter (HOSPITAL_COMMUNITY)
Admission: RE | Admit: 2022-08-05 | Discharge: 2022-08-05 | Disposition: A | Discharge status: Home / Self Care | Payer: Medicare Other | Source: Ambulatory Visit | Attending: Internal Medicine | Admitting: Internal Medicine

## 2022-08-05 VITALS — Wt 216.9 lb

## 2022-08-05 DIAGNOSIS — J449 Chronic obstructive pulmonary disease, unspecified: Secondary | ICD-10-CM

## 2022-08-05 NOTE — Progress Notes (Signed)
Daily Session Note  Patient Details  Name: Dennis [name] MRN: [id number] Date of Birth: 1951-08-14 Referring Provider:   Flowsheet Row PULMONARY REHAB COPD ORIENTATION from 07/01/2022 in [place]  Referring Provider Dr. Melvyn Novas       Encounter Date: 08/05/2022  Check In:  Session Check In - 08/05/22 1045       Check-In   Supervising physician immediately available to respond to emergencies [facility] MD immediately available    Physician(s) Dr Domenic Polite    Location [facility]    Staff Present Redge Gainer, BS, Exercise Physiologist;Siraj Dermody Hassell Done, RN, BSN    Virtual Visit No    Medication changes reported     No    Fall or balance concerns reported    No    Tobacco Cessation No Change    Warm-up and Cool-down Performed as group-led instruction    Resistance Training Performed Yes    VAD Patient? No    PAD/SET Patient? No      Pain Assessment   Currently in Pain? No/denies    Multiple Pain Sites No             Capillary Blood Glucose: No results found for this or any previous visit (from the past 24 hour(s)).    Social History   Tobacco Use  Smoking Status Former   Packs/day: 2.00   Years: 27.00   Total pack years: 54.00   Types: Cigarettes   Start date: 02/01/1971   Quit date: 01/31/1998   Years since quitting: 24.5  Smokeless Tobacco Current   Types: Chew    Goals Met:  Proper associated with RPD/PD & O2 Sat Independence with exercise equipment Using PLB without cueing & demonstrates good technique Exercise tolerated well Queuing for purse lip breathing No report of concerns or symptoms today Strength training completed today  Goals Unmet:  Not Applicable  Comments: checkout at 1145.   Dr. Kathie Dike is Medical Director for [facility] Pulmonary Rehab.

## 2022-08-06 ENCOUNTER — Ambulatory Visit (INDEPENDENT_AMBULATORY_CARE_PROVIDER_SITE_OTHER): Payer: Medicare Other | Admitting: Vascular Surgery

## 2022-08-06 ENCOUNTER — Encounter: Payer: Self-pay | Admitting: Vascular Surgery

## 2022-08-06 VITALS — BP 136/82 | HR 55 | Temp 97.5°F | Ht 73.0 in | Wt 215.0 lb

## 2022-08-06 DIAGNOSIS — I739 Peripheral vascular disease, unspecified: Secondary | ICD-10-CM

## 2022-08-06 DIAGNOSIS — I2510 Atherosclerotic heart disease of native coronary artery without angina pectoris: Secondary | ICD-10-CM

## 2022-08-06 NOTE — Progress Notes (Signed)
[facility] [place]  Patient name: Dennis [name] MRN: [id number] DOB: February 17, 1951 Sex: male  REASON FOR CONSULT: Evaluation bilateral lower extremity claudication  HPI: Dennis [name] is a 71 y.o. male, who is here today for discussion of bilateral lower extremity claudication.  He is here today with his wife.  He reports several year history of progressive lower extremity claudication symptoms.  He has no symptoms when walking around his home but if he is walking any distance particularly uphill quickly he has fatigue and has to stop walking.  He can resume after at rest..  He does not have any rest pain.  He does have an unusual sensation of numbness in his great toes bilaterally which is worse at night.  He reports that this is limited to him.  He has 10 grandchildren and makes it difficult for him to correct with them with his walking limitation.  Past Medical History:  Diagnosis Date   Atrial flutter (HCC)    Diagnosed by ECG August 2015   Colitis    COPD (chronic obstructive pulmonary disease) (HCC)    Coronary atherosclerosis of native coronary artery    Multivessel status post CABG   Essential hypertension    Hyperlipidemia    Insomnia     Family History  Problem Relation Age of Onset   Hypertension Mother    Alcohol abuse Mother    Alcohol abuse Father    Colon cancer Neg Hx     SOCIAL HISTORY: Social History   Socioeconomic History   Marital status: Married    Spouse name: Not on file   Number of children: 3   Years of education: Not on file   Highest education level: Not on file  Occupational History   Not on file  Tobacco Use   Smoking status: Former    Packs/day: 2.00    Years: 27.00    Total pack years: 54.00    Types: Cigarettes    Start date: 02/01/1971    Quit date: 01/31/1998    Years since quitting: 24.5   Smokeless tobacco: Current    Types: Chew  Vaping Use   Vaping Use: Never used   Substance and Sexual Activity   Alcohol use: Yes    Alcohol/week: 0.0 standard drinks of alcohol    Comment: Drinks beer daily, typically 2-4 a day. Also 4 oz red wine an evening.   Drug use: No   Sexual activity: Not on file  Other Topics Concern   Not on file  Social History Narrative   Lives with wife, Victorino Dike   Social Determinants of Health   Financial Resource Strain: Medium Risk (08/05/2021)   Overall Financial Resource Strain (CARDIA)    Difficulty of Paying Living Expenses: Somewhat hard  Food Insecurity: No Food Insecurity (08/05/2021)   Hunger Vital Sign    Worried About Running Out of Food in the Last Year: Never true    Ran Out of Food in the Last Year: Never true  Transportation Needs: No Transportation Needs (08/05/2021)   PRAPARE - Administrator, Civil Service (Medical): No    Lack of Transportation (Non-Medical): No  Physical Activity: Insufficiently Active (08/05/2021)   Exercise Vital Sign    Days of Exercise per Week: 3 days    Minutes of Exercise per Session: 30 min  Stress: No Stress Concern Present (08/05/2021)   [employer] of Occupational Health - Occupational Stress Questionnaire    Feeling of  Stress : Only a little  Social Connections: Moderately Isolated (08/05/2021)   Social Connection and Isolation Panel [NHANES]    Frequency of Communication with Friends and Family: More than three times a week    Frequency of Social Gatherings with Friends and Family: More than three times a week    Attends Religious Services: Never    Marine scientist or Organizations: No    Attends Archivist Meetings: Never    Marital Status: Married  Human resources officer Violence: Not At Risk (08/05/2021)   Humiliation, Afraid, Rape, and Kick questionnaire    Fear of Current or Ex-Partner: No    Emotionally Abused: No    Physically Abused: No    Sexually Abused: No    Allergies  Allergen Reactions   Ace Inhibitors Shortness Of Breath and  Cough   Enalapril Cough    Pt has been switched to Valsartan and is tolerating the different class.   Dexamethasone Itching    Pt was prescribed this in december   Alirocumab Rash and Other (See Comments)    blisters   Clotrimazole-Betamethasone Rash   Prednisone Rash    Current Outpatient Medications  Medication Sig Dispense Refill   albuterol (PROVENTIL) (2.5 MG/3ML) 0.083% nebulizer solution Take 3 mLs (2.5 mg total) by nebulization every 6 (six) hours as needed for wheezing or shortness of breath. 150 mL 3   albuterol (VENTOLIN HFA) 108 (90 Base) MCG/ACT inhaler Inhale 2 puffs into the lungs every 4 (four) hours as needed for wheezing. 18 g 5   ALPRAZolam (XANAX) 0.5 MG tablet Take 1 tablet (0.5 mg total) by mouth 2 (two) times daily as needed for anxiety. 30 tablet 1   aspirin EC 81 MG tablet Take 1 tablet (81 mg total) by mouth daily. (Patient taking differently: Take 81 mg by mouth 2 (two) times a week.) 30 tablet 11   budesonide-formoterol (SYMBICORT) 160-4.5 MCG/ACT inhaler TAKE 2 PUFFS FIRST THING IN MORNING AND THEN ANOTHER 2 PUFFS ABOUT 12 HOURS LATER. 30.6 each 1   ezetimibe-simvastatin (VYTORIN) 10-40 MG tablet TAKE 1 TABLET BY MOUTH AT BEDTIME. TAKE 1 TABLET BY MOUTH EVERYDAY AT BEDTIME 90 tablet 1   fenofibrate 160 MG tablet TAKE 1 TABLET BY MOUTH EVERY DAY 90 tablet 1   fish oil-omega-3 fatty acids 1000 MG capsule Take 1 g by mouth in the morning and at bedtime.     guaiFENesin (MUCINEX) 600 MG 12 hr tablet Take 1 tablet (600 mg total) by mouth 2 (two) times daily. 20 tablet 0   losartan-hydrochlorothiazide (HYZAAR) 100-25 MG tablet TAKE 1 TABLET BY MOUTH EVERY DAY 90 tablet 0   metoprolol tartrate (LOPRESSOR) 50 MG tablet TAKE 1 TABLET BY MOUTH TWICE A DAY 180 tablet 1   mupirocin ointment (BACTROBAN) 2 % Apply 1 application topically 2 (two) times daily. (Patient taking differently: Apply 1 application  topically 2 (two) times daily as needed (wound care).) 22 g 0    nitroGLYCERIN (NITROSTAT) 0.4 MG SL tablet PLACE 1 TABLET (0.4 MG TOTAL) UNDER THE TONGUE EVERY 5 (FIVE) MINUTES AS NEEDED. 25 tablet 3   Respiratory Therapy Supplies (NEBULIZER/TUBING/MOUTHPIECE) KIT Nebulizer tubing and mouthpiece 1 kit 0   tamsulosin (FLOMAX) 0.4 MG CAPS capsule Take 1 capsule (0.4 mg total) by mouth daily. 90 capsule 3   Vitamin D, Ergocalciferol, (DRISDOL) 1.25 MG (50000 UNIT) CAPS capsule Take 1 capsule (50,000 Units total) by mouth every 7 (seven) days. 12 capsule 1   zolpidem (AMBIEN) 10  MG tablet Take 1 tablet (10 mg total) by mouth at bedtime as needed for sleep. 30 tablet 3   No current facility-administered medications for this visit.    REVIEW OF SYSTEMS:  $RemoveB'[X]'QLtfebVH$  denotes positive finding, $RemoveBeforeDEI'[ ]'[id number]$  denotes negative finding Cardiac  Comments:  Chest pain or chest pressure:    Shortness of breath upon exertion: x   Short of breath when lying flat:    Irregular heart rhythm:        Vascular    Pain in calf, thigh, or hip brought on by ambulation: x   Pain in feet at night that wakes you up from your sleep:     Blood clot in your veins:    Leg swelling:         Pulmonary    Oxygen at home:    Productive cough:     Wheezing:      x   Neurologic    Sudden weakness in arms or legs:     Sudden numbness in arms or legs:     Sudden onset of difficulty speaking or slurred speech:    Temporary loss of vision in one eye:     Problems with dizziness:         Gastrointestinal    Blood in stool:     Vomited blood:         Genitourinary    Burning when urinating:     Blood in urine:        Psychiatric    Major depression:         Hematologic    Bleeding problems:    Problems with blood clotting too easily:        Skin    Rashes or ulcers:        Constitutional    Fever or chills:      PHYSICAL EXAM: Vitals:   08/06/22 0902  BP: 136/82  Pulse: (!) 55  Temp: (!) 97.5 F (36.4 C)  SpO2: 97%  Weight: 215 lb (97.5 kg)  Height: $Remove'6\' 1"'XbtBCkl$  (1.854 m)     GENERAL: The patient is a well-nourished male, in no acute distress. The vital signs are documented above. CARDIOVASCULAR: He has 2+ right femoral and 1+ left femoral pulse.  I do not palpate popliteal or distal pulses PULMONARY: There is good air exchange  MUSCULOSKELETAL: There are no major deformities or cyanosis. NEUROLOGIC: No focal weakness or paresthesias are detected. SKIN: There are no ulcers or rashes noted. PSYCHIATRIC: The patient has a normal affect.  DATA:  Noninvasive studies from [facility] on 06/12/2022 revealed ankle arm index of 0.67 on the right and 0.69 on the left  I reviewed the CT scan for evaluation of hematuria and reviewed the actual films with the patient and his wife.  This was from 01/10/2019.  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  They have a multitude of questions regarding options.  I did explain that he is currently not of any evidence of limb threatening ischemia and the certainly safe to continue observation only.  He reports that he is quite limited by this.  I explained the neck step in evaluation would be arteriography and possible endovascular treatment depending on the  location of his flow-limiting stenoses.  Also explained that he may be a candidate for common femoral endarterectomy bilaterally depending on the arteriogram.  Reviewed the procedure and potential risks as well.  He reports that he wishes to continue observation currently.  He knows to report immediately should he develop any tissue loss.  We will see him on an as-needed basis   Rosetta Posner, MD [facility] Tel [phone] Pager [phone]  Note:  Portions of this report may have been transcribed using voice recognition software.  Every effort has been made to ensure accuracy; however, inadvertent computerized transcription errors may still be present.

## 2022-08-06 NOTE — Progress Notes (Signed)
Pulmonary Individual Treatment Plan  Patient Details  Name: Dennis [name] MRN: [id number] Date of Birth: [birth year] Referring Provider:   Flowsheet Row PULMONARY REHAB COPD ORIENTATION from 07/01/2022 in [facility] CARDIAC REHABILITATION  Referring Provider Dr. Sherene Sires       Initial Encounter Date:  Flowsheet Row PULMONARY REHAB COPD ORIENTATION from 07/01/2022 in [facility]ARDIAC REH[facility]ATION  Date 07/01/22       Visit Diagnosis: COPD mixed type (HCC)  Patient's Home Medications on Admission:   Current Outpatient Medications:    albuterol (PROVENTIL) (2.5 MG/3ML) 0.083% nebulizer solution, Take 3 mLs (2.5 mg total) by nebulization every 6 (six) hours as needed for wheezing or shortness of breath., Disp: 150 mL, Rfl: 3   albuterol (VENTOLIN HFA) 108 (90 Base) MCG/ACT inhaler, Inhale 2 puffs into the lungs every 4 (four) hours as needed for wheezing., Disp: 18 g, Rfl: 5   ALPRAZolam (XANAX) 0.5 MG tablet, Take 1 tablet (0.5 mg total) by mouth 2 (two) times daily as needed for anxiety., Disp: 30 tablet, Rfl: 1   aspirin EC 81 MG tablet, Take 1 tablet (81 mg total) by mouth daily. (Patient taking differently: Take 81 mg by mouth 2 (two) times a week.), Disp: 30 tablet, Rfl: 11   budesonide-formoterol (SYMBICORT) 160-4.5 MCG/ACT inhaler, TAKE 2 PUFFS FIRST THING IN MORNING AND THEN ANOTHER 2 PUFFS ABOUT 12 HOURS LATER., Disp: 30.6 each, Rfl: 1   ezetimibe-simvastatin (VYTORIN) 10-40 MG tablet, TAKE 1 TABLET BY MOUTH AT BEDTIME. TAKE 1 TABLET BY MOUTH EVERYDAY AT BEDTIME, Disp: 90 tablet, Rfl: 1   fenofibrate 160 MG tablet, TAKE 1 TABLET BY MOUTH EVERY DAY, Disp: 90 tablet, Rfl: 1   fish oil-omega-3 fatty acids 1000 MG capsule, Take 1 g by mouth in the morning and at bedtime., Disp: , Rfl:    guaiFENesin (MUCINEX) 600 MG 12 hr tablet, Take 1 tablet (600 mg total) by mouth 2 (two) times daily., Disp: 20 tablet, Rfl: 0   losartan-hydrochlorothiazide (HYZAAR) 100-25 MG tablet, TAKE 1 TABLET BY  MOUTH EVERY DAY, Disp: 90 tablet, Rfl: 0   metoprolol tartrate (LOPRESSOR) 50 MG tablet, TAKE 1 TABLET BY MOUTH TWICE A DAY, Disp: 180 tablet, Rfl: 1   mupirocin ointment (BACTROBAN) 2 %, Apply 1 application topically 2 (two) times daily. (Patient taking differently: Apply 1 application  topically 2 (two) times daily as needed (wound care).), Disp: 22 g, Rfl: 0   nitroGLYCERIN (NITROSTAT) 0.4 MG SL tablet, PLACE 1 TABLET (0.4 MG TOTAL) UNDER THE TONGUE EVERY 5 (FIVE) MINUTES AS NEEDED., Disp: 25 tablet, Rfl: 3   Respiratory Therapy Supplies (NEBULIZER/TUBING/MOUTHPIECE) KIT, Nebulizer tubing and mouthpiece, Disp: 1 kit, Rfl: 0   tamsulosin (FLOMAX) 0.4 MG CAPS capsule, Take 1 capsule (0.4 mg total) by mouth daily., Disp: 90 capsule, Rfl: 3   Vitamin D, Ergocalciferol, (DRISDOL) 1.25 MG (50000 UNIT) CAPS capsule, Take 1 capsule (50,000 Units total) by mouth every 7 (seven) days., Disp: 12 capsule, Rfl: 1   zolpidem (AMBIEN) 10 MG tablet, Take 1 tablet (10 mg total) by mouth at bedtime as needed for sleep., Disp: 30 tablet, Rfl: 3  Past Medical History: Past Medical History:  Diagnosis Date   Atrial flutter (HCC)    Diagnosed by ECG August 2015   Colitis    COPD (chronic obstructive pulmonary disease) (HCC)    Coronary atherosclerosis of native coronary artery    Multivessel status post CABG   Essential hypertension    Hyperlipidemia    Insomnia  Tobacco Use: Social History   Tobacco Use  Smoking Status Former   Packs/day: 2.00   Years: 27.00   Total pack years: 54.00   Types: Cigarettes   Start date: 02/01/1971   Quit date: 01/31/1998   Years since quitting: 24.5  Smokeless Tobacco Current   Types: Chew    Labs: Review Flowsheet  More data exists      Latest Ref Rng & Units 07/23/2020 12/26/2020 07/03/2021 02/26/2022 07/08/2022  Labs for ITP Cardiac and Pulmonary Rehab  Cholestrol 100 - 199 mg/dL 472  006  691  - 479   LDL (calc) 0 - 99 mg/dL 177  722  161  - 609   HDL-C >39  mg/dL 43  42  43  - 43   Trlycerides 0 - 149 mg/dL 589  515  947  - 550   Hemoglobin A1c 4.8 - 5.6 % - 5.7  6.0  5.9  -    Capillary Blood Glucose: Lab Results  Component Value Date   GLUCAP 177 (H) 03/26/2020     Pulmonary Assessment Scores:  Pulmonary Assessment Scores     Row Name 07/01/22 0852         ADL UCSD   SOB Score total 40     Rest 1     Walk 2     Stairs 5     Bath 1     Dress 1     Shop 1       CAT Score   CAT Score 24       mMRC Score   mMRC Score 3             UCSD: Self-administered rating of dyspnea associated with activities of daily living (ADLs) 6-point scale (0 = "not at all" to 5 = "maximal or unable to do because of breathlessness")  Scoring Scores range from 0 to 120.  Minimally important difference is 5 units  CAT: CAT can identify the health impairment of COPD patients and is better correlated with disease progression.  CAT has a scoring range of zero to 40. The CAT score is classified into four groups of low (less than 10), medium (10 - 20), high (21-30) and very high (31-40) based on the impact level of disease on health status. A CAT score over 10 suggests significant symptoms.  A worsening CAT score could be explained by an exacerbation, poor medication adherence, poor inhaler technique, or progression of COPD or comorbid conditions.  CAT MCID is 2 points  mMRC: mMRC (Modified Medical Research Council) Dyspnea Scale is used to assess the degree of baseline functional disability in patients of respiratory disease due to dyspnea. No minimal important difference is established. A decrease in score of 1 point or greater is considered a positive change.   Pulmonary Function Assessment:   Exercise Target Goals: Exercise Program Goal: Individual exercise prescription set using results from initial 6 min walk test and THRR while considering  patient's activity barriers and safety.   Exercise Prescription Goal: Initial exercise  prescription builds to 30-45 minutes a day of aerobic activity, 2-3 days per week.  Home exercise guidelines will be given to patient during program as part of exercise prescription that the participant will acknowledge.  Activity Barriers & Risk Stratification:  Activity Barriers & Cardiac Risk Stratification - 07/01/22 0855       Activity Barriers & Cardiac Risk Stratification   Activity Barriers Other (comment)    Comments peripheral vascular disease  Cardiac Risk Stratification High             6 Minute Walk:  6 Minute Walk     Row Name 07/01/22 0947         6 Minute Walk   Phase Initial     Distance 1200 feet     Walk Time 6 minutes     # of Rest Breaks 0     MPH 2.27     METS 2.56     RPE 15     Perceived Dyspnea  11     VO2 Peak 8.96     Symptoms No     Resting HR 55 bpm     Resting BP 102/70     Resting Oxygen Saturation  93 %     Exercise Oxygen Saturation  during 6 min walk 93 %     Max Ex. HR 74 bpm     Max Ex. BP 138/70     2 Minute Post BP 120/68       Interval HR   1 Minute HR 62     2 Minute HR 64     3 Minute HR 65     4 Minute HR 70     5 Minute HR 70     6 Minute HR 74     2 Minute Post HR 61     Interval Heart Rate? Yes       Interval Oxygen   Interval Oxygen? Yes     Baseline Oxygen Saturation % 93 %     1 Minute Oxygen Saturation % 93 %     1 Minute Liters of Oxygen 0 L     2 Minute Oxygen Saturation % 93 %     2 Minute Liters of Oxygen 0 L     3 Minute Oxygen Saturation % 93 %     3 Minute Liters of Oxygen 0 L     4 Minute Oxygen Saturation % 93 %     4 Minute Liters of Oxygen 0 L     5 Minute Oxygen Saturation % 93 %     5 Minute Liters of Oxygen 0 L     6 Minute Oxygen Saturation % 93 %     6 Minute Liters of Oxygen 0 L     2 Minute Post Oxygen Saturation % 95 %     2 Minute Post Liters of Oxygen 0 L              Oxygen Initial Assessment:  Oxygen Initial Assessment - 07/01/22 0946       Home Oxygen   Home  Oxygen Device None    Sleep Oxygen Prescription None    Home Exercise Oxygen Prescription None    Home Resting Oxygen Prescription None    Compliance with Home Oxygen Use Yes      Initial 6 min Walk   Oxygen Used None      Program Oxygen Prescription   Program Oxygen Prescription None      Intervention   Short Term Goals To learn and understand importance of monitoring SPO2 with pulse oximeter and demonstrate accurate use of the pulse oximeter.;To learn and understand importance of maintaining oxygen saturations>88%;To learn and demonstrate proper pursed lip breathing techniques or other breathing techniques.     Long  Term Goals Verbalizes importance of monitoring SPO2 with pulse oximeter and return demonstration;Maintenance of O2 saturations>88%;Exhibits proper breathing techniques, such as pursed  lip breathing or other method taught during program session;Compliance with respiratory medication             Oxygen Re-Evaluation:  Oxygen Re-Evaluation     Row Name 07/08/22 1253 08/05/22 1152           Program Oxygen Prescription   Program Oxygen Prescription None None        Home Oxygen   Home Oxygen Device None None      Sleep Oxygen Prescription None None      Home Exercise Oxygen Prescription None None      Home Resting Oxygen Prescription None None      Compliance with Home Oxygen Use Yes Yes        Goals/Expected Outcomes   Short Term Goals To learn and understand importance of monitoring SPO2 with pulse oximeter and demonstrate accurate use of the pulse oximeter.;To learn and understand importance of maintaining oxygen saturations>88%;To learn and demonstrate proper pursed lip breathing techniques or other breathing techniques.  To learn and understand importance of monitoring SPO2 with pulse oximeter and demonstrate accurate use of the pulse oximeter.;To learn and understand importance of maintaining oxygen saturations>88%;To learn and demonstrate proper pursed lip  breathing techniques or other breathing techniques.       Long  Term Goals Verbalizes importance of monitoring SPO2 with pulse oximeter and return demonstration;Maintenance of O2 saturations>88%;Exhibits proper breathing techniques, such as pursed lip breathing or other method taught during program session;Compliance with respiratory medication Verbalizes importance of monitoring SPO2 with pulse oximeter and return demonstration;Maintenance of O2 saturations>88%;Exhibits proper breathing techniques, such as pursed lip breathing or other method taught during program session;Compliance with respiratory medication      Goals/Expected Outcomes Compliance Compliance               Oxygen Discharge (Final Oxygen Re-Evaluation):  Oxygen Re-Evaluation - 08/05/22 1152       Program Oxygen Prescription   Program Oxygen Prescription None      Home Oxygen   Home Oxygen Device None    Sleep Oxygen Prescription None    Home Exercise Oxygen Prescription None    Home Resting Oxygen Prescription None    Compliance with Home Oxygen Use Yes      Goals/Expected Outcomes   Short Term Goals To learn and understand importance of monitoring SPO2 with pulse oximeter and demonstrate accurate use of the pulse oximeter.;To learn and understand importance of maintaining oxygen saturations>88%;To learn and demonstrate proper pursed lip breathing techniques or other breathing techniques.     Long  Term Goals Verbalizes importance of monitoring SPO2 with pulse oximeter and return demonstration;Maintenance of O2 saturations>88%;Exhibits proper breathing techniques, such as pursed lip breathing or other method taught during program session;Compliance with respiratory medication    Goals/Expected Outcomes Compliance             Initial Exercise Prescription:  Initial Exercise Prescription - 07/01/22 0900       Date of Initial Exercise RX and Referring Provider   Date 07/01/22    Referring Provider Dr. Sherene Sires     Expected Discharge Date 11/04/22      Recumbant Elliptical   Level 1    RPM 45    Minutes 39      Prescription Details   Frequency (times per week) 2    Duration Progress to 30 minutes of continuous aerobic without signs/symptoms of physical distress      Intensity   THRR 40-80% of Max Heartrate 60-121  Ratings of Perceived Exertion 11-13    Perceived Dyspnea 0-4      Resistance Training   Training Prescription Yes    Weight 4    Reps 10-15             Perform Capillary Blood Glucose checks as needed.  Exercise Prescription Changes:   Exercise Prescription Changes     Row Name 07/08/22 1200 07/22/22 1200 08/05/22 1100         Response to Exercise   Blood Pressure (Admit) 126/70 148/72 138/78     Blood Pressure (Exercise) 124/70 126/72 140/78     Blood Pressure (Exit) 108/68 110/72 106/64     Heart Rate (Admit) 60 bpm 58 bpm 63 bpm     Heart Rate (Exercise) 66 bpm 65 bpm 60 bpm     Heart Rate (Exit) 65 bpm 62 bpm 64 bpm     Oxygen Saturation (Admit) 92 % 93 % 96 %     Oxygen Saturation (Exercise) 93 % 93 % 92 %     Oxygen Saturation (Exit) 95 % 95 % 96 %     Rating of Perceived Exertion (Exercise) 12 12 13      Perceived Dyspnea (Exercise) 12 12 13      Duration Continue with 30 min of aerobic exercise without signs/symptoms of physical distress. Continue with 30 min of aerobic exercise without signs/symptoms of physical distress. Continue with 30 min of aerobic exercise without signs/symptoms of physical distress.     Intensity THRR unchanged THRR unchanged THRR unchanged       Progression   Progression Continue to progress workloads to maintain intensity without signs/symptoms of physical distress. Continue to progress workloads to maintain intensity without signs/symptoms of physical distress. Continue to progress workloads to maintain intensity without signs/symptoms of physical distress.       Resistance Training   Training Prescription Yes Yes Yes      Weight 5 5 5      Reps 10-15 10-15 10-15     Time 10 Minutes 10 Minutes 10 Minutes       Recumbant Elliptical   Level 1 3 5      RPM 55 45 40     Minutes 39 39 39     METs 3 3.1 2.2              Exercise Comments:   Exercise Goals and Review:   Exercise Goals     Row Name 07/01/22 0953 07/08/22 1250 08/05/22 1148         Exercise Goals   Increase Physical Activity Yes Yes Yes     Intervention Provide advice, education, support and counseling about physical activity/exercise needs.;Develop an individualized exercise prescription for aerobic and resistive training based on initial evaluation findings, risk stratification, comorbidities and participant's personal goals. Provide advice, education, support and counseling about physical activity/exercise needs.;Develop an individualized exercise prescription for aerobic and resistive training based on initial evaluation findings, risk stratification, comorbidities and participant's personal goals. Provide advice, education, support and counseling about physical activity/exercise needs.;Develop an individualized exercise prescription for aerobic and resistive training based on initial evaluation findings, risk stratification, comorbidities and participant's personal goals.     Expected Outcomes Short Term: Attend rehab on a regular basis to increase amount of physical activity.;Long Term: Add in home exercise to make exercise part of routine and to increase amount of physical activity.;Long Term: Exercising regularly at least 3-5 days a week. Short Term: Attend rehab on a regular basis to  increase amount of physical activity.;Long Term: Add in home exercise to make exercise part of routine and to increase amount of physical activity.;Long Term: Exercising regularly at least 3-5 days a week. Short Term: Attend rehab on a regular basis to increase amount of physical activity.;Long Term: Add in home exercise to make exercise part of routine and to  increase amount of physical activity.;Long Term: Exercising regularly at least 3-5 days a week.     Increase Strength and Stamina Yes Yes Yes     Intervention Provide advice, education, support and counseling about physical activity/exercise needs.;Develop an individualized exercise prescription for aerobic and resistive training based on initial evaluation findings, risk stratification, comorbidities and participant's personal goals. Provide advice, education, support and counseling about physical activity/exercise needs.;Develop an individualized exercise prescription for aerobic and resistive training based on initial evaluation findings, risk stratification, comorbidities and participant's personal goals. Provide advice, education, support and counseling about physical activity/exercise needs.;Develop an individualized exercise prescription for aerobic and resistive training based on initial evaluation findings, risk stratification, comorbidities and participant's personal goals.     Expected Outcomes Short Term: Increase workloads from initial exercise prescription for resistance, speed, and METs.;Short Term: Perform resistance training exercises routinely during rehab and add in resistance training at home;Long Term: Improve cardiorespiratory fitness, muscular endurance and strength as measured by increased METs and functional capacity (6MWT) Short Term: Increase workloads from initial exercise prescription for resistance, speed, and METs.;Short Term: Perform resistance training exercises routinely during rehab and add in resistance training at home;Long Term: Improve cardiorespiratory fitness, muscular endurance and strength as measured by increased METs and functional capacity (6MWT) Short Term: Increase workloads from initial exercise prescription for resistance, speed, and METs.;Short Term: Perform resistance training exercises routinely during rehab and add in resistance training at home;Long Term:  Improve cardiorespiratory fitness, muscular endurance and strength as measured by increased METs and functional capacity (6MWT)     Able to understand and use rate of perceived exertion (RPE) scale Yes Yes Yes     Intervention Provide education and explanation on how to use RPE scale Provide education and explanation on how to use RPE scale Provide education and explanation on how to use RPE scale     Expected Outcomes Short Term: Able to use RPE daily in rehab to express subjective intensity level;Long Term:  Able to use RPE to guide intensity level when exercising independently Short Term: Able to use RPE daily in rehab to express subjective intensity level;Long Term:  Able to use RPE to guide intensity level when exercising independently Short Term: Able to use RPE daily in rehab to express subjective intensity level;Long Term:  Able to use RPE to guide intensity level when exercising independently     Able to understand and use Dyspnea scale Yes Yes Yes     Intervention Provide education and explanation on how to use Dyspnea scale Provide education and explanation on how to use Dyspnea scale Provide education and explanation on how to use Dyspnea scale     Expected Outcomes Short Term: Able to use Dyspnea scale daily in rehab to express subjective sense of shortness of breath during exertion;Long Term: Able to use Dyspnea scale to guide intensity level when exercising independently Short Term: Able to use Dyspnea scale daily in rehab to express subjective sense of shortness of breath during exertion;Long Term: Able to use Dyspnea scale to guide intensity level when exercising independently Short Term: Able to use Dyspnea scale daily in rehab to express subjective sense  of shortness of breath during exertion;Long Term: Able to use Dyspnea scale to guide intensity level when exercising independently     Knowledge and understanding of Target Heart Rate Range (THRR) Yes -- Yes     Intervention Provide  education and explanation of THRR including how the numbers were predicted and where they are located for reference Provide education and explanation of THRR including how the numbers were predicted and where they are located for reference Provide education and explanation of THRR including how the numbers were predicted and where they are located for reference     Expected Outcomes Short Term: Able to state/look up THRR;Short Term: Able to use daily as guideline for intensity in rehab;Long Term: Able to use THRR to govern intensity when exercising independently Short Term: Able to state/look up THRR;Short Term: Able to use daily as guideline for intensity in rehab;Long Term: Able to use THRR to govern intensity when exercising independently Short Term: Able to state/look up THRR;Short Term: Able to use daily as guideline for intensity in rehab;Long Term: Able to use THRR to govern intensity when exercising independently     Understanding of Exercise Prescription Yes Yes Yes     Intervention Provide education, explanation, and written materials on patient's individual exercise prescription Provide education, explanation, and written materials on patient's individual exercise prescription Provide education, explanation, and written materials on patient's individual exercise prescription     Expected Outcomes Short Term: Able to explain program exercise prescription;Long Term: Able to explain home exercise prescription to exercise independently Short Term: Able to explain program exercise prescription;Long Term: Able to explain home exercise prescription to exercise independently Short Term: Able to explain program exercise prescription;Long Term: Able to explain home exercise prescription to exercise independently              Exercise Goals Re-Evaluation :  Exercise Goals Re-Evaluation     Row Name 07/08/22 1251 08/05/22 1148           Exercise Goal Re-Evaluation   Exercise Goals Review Increase  Physical Activity;Increase Strength and Stamina;Able to understand and use rate of perceived exertion (RPE) scale;Able to understand and use Dyspnea scale;Able to check pulse independently;Understanding of Exercise Prescription Increase Physical Activity;Increase Strength and Stamina;Able to understand and use rate of perceived exertion (RPE) scale;Able to understand and use Dyspnea scale;Knowledge and understanding of Target Heart Rate Range (THRR);Understanding of Exercise Prescription      Comments Pt has completed 2 sessions of PR. He is motivated during class and focused on increasing his distance on each class. He is currently exercising at 3.0 METs on the ellp. Will continue to monitor and progress as able. Pt has completed 9 sessions of PR. He is focued on increasing his level and distance for each class. He is currently exercising at 2.9 METs on the ellp. Pt also has leg pain when first increasing his levels, he stated he is to see a vein specialist soon. Will monitor and progres as able.      Expected Outcomes Through exercise at rehab and at home, the patient will meet their stated goals. Through exercise at rehab and at home, the patient will meet their stated goals.               Discharge Exercise Prescription (Final Exercise Prescription Changes):  Exercise Prescription Changes - 08/05/22 1100       Response to Exercise   Blood Pressure (Admit) 138/78    Blood Pressure (Exercise) 140/78  Blood Pressure (Exit) 106/64    Heart Rate (Admit) 63 bpm    Heart Rate (Exercise) 60 bpm    Heart Rate (Exit) 64 bpm    Oxygen Saturation (Admit) 96 %    Oxygen Saturation (Exercise) 92 %    Oxygen Saturation (Exit) 96 %    Rating of Perceived Exertion (Exercise) 13    Perceived Dyspnea (Exercise) 13    Duration Continue with 30 min of aerobic exercise without signs/symptoms of physical distress.    Intensity THRR unchanged      Progression   Progression Continue to progress workloads  to maintain intensity without signs/symptoms of physical distress.      Resistance Training   Training Prescription Yes    Weight 5    Reps 10-15    Time 10 Minutes      Recumbant Elliptical   Level 5    RPM 40    Minutes 39    METs 2.2             Nutrition:  Target Goals: Understanding of nutrition guidelines, daily intake of sodium '1500mg'$ , cholesterol '200mg'$ , calories 30% from fat and 7% or less from saturated fats, daily to have 5 or more servings of fruits and vegetables.  Biometrics:  Pre Biometrics - 07/01/22 0953       Pre Biometrics   Height $Remov'6\' 1"'rBNNSb$  (1.854 m)    Weight 99.5 kg    Waist Circumference 44 inches    Hip Circumference 43 inches    Waist to Hip Ratio 1.02 %    BMI (Calculated) 28.95    Triceps Skinfold 20 mm    % Body Fat 30.6 %    Grip Strength 45 kg    Flexibility 0 in    Single Leg Stand 4.35 seconds              Nutrition Therapy Plan and Nutrition Goals:  Nutrition Therapy & Goals - 07/02/22 0848       Personal Nutrition Goals   Comments Patient scored 57 on his diet assessment. We offer 2 educational sessions on heart healthy nutrition with handouts.      Intervention Plan   Intervention Nutrition handout(s) given to patient.    Expected Outcomes Short Term Goal: Understand basic principles of dietary content, such as calories, fat, sodium, cholesterol and nutrients.             Nutrition Assessments:  Nutrition Assessments - 07/01/22 0902       MEDFICTS Scores   Pre Score 57            MEDIFICTS Score Key: ?70 Need to make dietary changes  40-70 Heart Healthy Diet ? 40 Therapeutic Level Cholesterol Diet   Picture Your Plate Scores: <38 Unhealthy dietary pattern with much room for improvement. 41-50 Dietary pattern unlikely to meet recommendations for good health and room for improvement. 51-60 More healthful dietary pattern, with some room for improvement.  >60 Healthy dietary pattern, although there may  be some specific behaviors that could be improved.    Nutrition Goals Re-Evaluation:   Nutrition Goals Discharge (Final Nutrition Goals Re-Evaluation):   Psychosocial: Target Goals: Acknowledge presence or absence of significant depression and/or stress, maximize coping skills, provide positive support system. Participant is able to verbalize types and ability to use techniques and skills needed for reducing stress and depression.  Initial Review & Psychosocial Screening:  Initial Psych Review & Screening - 07/01/22 0859       Initial  Review   Current issues with Current Sleep Concerns;Current Anxiety/Panic      Family Dynamics   Good Support System? Yes    Comments His support system includes his wife, sons, and grand children.      Barriers   Psychosocial barriers to participate in program There are no identifiable barriers or psychosocial needs.      Screening Interventions   Interventions Encouraged to exercise    Expected Outcomes Long Term goal: The participant improves quality of Life and PHQ9 Scores as seen by post scores and/or verbalization of changes;Short Term goal: Identification and review with participant of any Quality of Life or Depression concerns found by scoring the questionnaire.             Quality of Life Scores:  Quality of Life - 07/01/22 0954       Quality of Life   Select Quality of Life      Quality of Life Scores   Health/Function Pre 17.13 %    Socioeconomic Pre 21.75 %    Psych/Spiritual Pre 19.93 %    Family Pre 26.4 %    GLOBAL Pre 19.97 %            Scores of 19 and below usually indicate a poorer quality of life in these areas.  A difference of  2-3 points is a clinically meaningful difference.  A difference of 2-3 points in the total score of the Quality of Life Index has been associated with significant improvement in overall quality of life, self-image, physical symptoms, and general health in studies assessing change in  quality of life.   PHQ-9: Review Flowsheet  More data exists      07/01/2022 06/09/2022 04/02/2022 03/24/2022 03/13/2022  Depression screen PHQ 2/9  Decreased Interest 3 0 0 0 0  Down, Depressed, Hopeless 1 0 0 0 0  PHQ - 2 Score 4 0 0 0 0  Altered sleeping 3 - - - -  Tired, decreased energy 3 - - - -  Change in appetite 1 - - - -  Feeling bad or failure about yourself  1 - - - -  Trouble concentrating 1 - - - -  Moving slowly or fidgety/restless 3 - - - -  Suicidal thoughts 0 - - - -  PHQ-9 Score 16 - - - -  Difficult doing work/chores Very difficult - - - -   Interpretation of Total Score  Total Score Depression Severity:  1-4 = Minimal depression, 5-9 = Mild depression, 10-14 = Moderate depression, 15-19 = Moderately severe depression, 20-27 = Severe depression   Psychosocial Evaluation and Intervention:  Psychosocial Evaluation - 07/01/22 0937       Psychosocial Evaluation & Interventions   Interventions Encouraged to exercise with the program and follow exercise prescription    Comments Pt has to participate in PR. He does have current anxiety and sleep concerns. He reports that he takes xanax PRN on days when he is "really on edge", but this is only about once per week. He states that a months supply of xanax lasts him 6-8 months. He does take ambien for his sleep, and he reports that this helps some, but he is unable to stay asleep for more than 2 hours at a time. 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 He scored a 16 on his PHQ-9, and he relates most of this  to his peripheral vascular disease in his legs and his lack of sleep. He is no longer able to walk long distances due to his leg pains and fatigue. He denies have depression or any thoughs of harming himself. He reports that he has a good support system with his wife, 3 sons, and 10 grandchildren. His goals while in the program are to  improve his leg strength and stamina, so that he can be able to walk longer distances again. He has participated in the PR and CR programs before and reported that he benefited from them, so he is eager to start the program again.    Expected Outcomes Pt's anxiety and sleep concerns will continue to be treated, and he will have no other identifiable psychosocial issues.    Continue Psychosocial Services  No Follow up required             Psychosocial Re-Evaluation:  Psychosocial Re-Evaluation     Cameron Name 07/02/22 0852 07/30/22 1404           Psychosocial Re-Evaluation   Current issues with Current Anxiety/Panic;Current Sleep Concerns Current Anxiety/Panic;Current Sleep Concerns      Comments Patient is new to the program. He plans to start tomorrow 8/10. We will continue to monitor his progress in the program. Patient has cpmpleted 8 sessions.  Reports having great support from family.  We will continue to monitor his progress in the program.      Expected Outcomes Patient will continue to have no psychosocial barriers and his anxiety and sleep will continue to be managed with Alprazolam and Ambien prn. Patient will continue to have no psychosocial barriers and his anxiety and sleep will continue to be managed with Alprazolam and Ambien prn.      Interventions Encouraged to attend Pulmonary Rehabilitation for the exercise;Relaxation education;Stress management education Encouraged to attend Pulmonary Rehabilitation for the exercise;Relaxation education;Stress management education      Continue Psychosocial Services  No Follow up required No Follow up required               Psychosocial Discharge (Final Psychosocial Re-Evaluation):  Psychosocial Re-Evaluation - 07/30/22 1404       Psychosocial Re-Evaluation   Current issues with Current Anxiety/Panic;Current Sleep Concerns    Comments Patient has cpmpleted 8 sessions.  Reports having great support from family.  We will continue  to monitor his progress in the program.    Expected Outcomes Patient will continue to have no psychosocial barriers and his anxiety and sleep will continue to be managed with Alprazolam and Ambien prn.    Interventions Encouraged to attend Pulmonary Rehabilitation for the exercise;Relaxation education;Stress management education    Continue Psychosocial Services  No Follow up required              Education: Education Goals: Education classes will be provided on a weekly basis, covering required topics. Participant will state understanding/return demonstration of topics presented.  Learning Barriers/Preferences:  Learning Barriers/Preferences - 07/01/22 9983       Learning Barriers/Preferences   Learning Barriers None    Learning Preferences Skilled Demonstration;Individual Instruction             Education Topics: How Lungs Work and Diseases: - Discuss the anatomy of the lungs and diseases that can affect the lungs, such as COPD.   Exercise: -Discuss the importance of exercise, FITT principles of exercise, normal and abnormal responses to exercise, and how to exercise safely.   Environmental Irritants: -Discuss types of  environmental irritants and how to limit exposure to environmental irritants.   Meds/Inhalers and oxygen: - Discuss respiratory medications, definition of an inhaler and oxygen, and the proper way to use an inhaler and oxygen.   Energy Saving Techniques: - Discuss methods to conserve energy and decrease shortness of breath when performing activities of daily living.    Bronchial Hygiene / Breathing Techniques: - Discuss breathing mechanics, pursed-lip breathing technique,  proper posture, effective ways to clear airways, and other functional breathing techniques   Cleaning Equipment: - Provides group verbal and written instruction about the health risks of elevated stress, cause of high stress, and healthy ways to reduce stress. Flowsheet Row  PULMONARY REHAB CHRONIC OBSTRUCTIVE PULMONARY DISEASE from 07/31/2022 in [place]  Date 07/03/22  Educator DF  Instruction Review Code 2- Demonstrated Understanding       Nutrition I: Fats: - Discuss the types of cholesterol, what cholesterol does to the body, and how cholesterol levels can be controlled. Flowsheet Row PULMONARY REHAB CHRONIC OBSTRUCTIVE PULMONARY DISEASE from 07/31/2022 in [place]  Date 07/10/22  Educator Handout  Instruction Review Code 1- Verbalizes Understanding       Nutrition II: Labels: -Discuss the different components of food labels and how to read food labels. Flowsheet Row PULMONARY REHAB CHRONIC OBSTRUCTIVE PULMONARY DISEASE from 07/31/2022 in [place]  Date 07/17/22  Educator [place]  Instruction Review Code 1- Verbalizes Understanding       Respiratory Infections: - Discuss the signs and symptoms of respiratory infections, ways to prevent respiratory infections, and the importance of seeking medical treatment when having a respiratory infection. Flowsheet Row PULMONARY REHAB CHRONIC OBSTRUCTIVE PULMONARY DISEASE from 07/31/2022 in [place]  Date 07/31/22  Educator Handout  Instruction Review Code 1- Verbalizes Understanding       Stress I: Signs and Symptoms: - Discuss the causes of stress, how stress may lead to anxiety and depression, and ways to limit stress.   Stress II: Relaxation: -Discuss relaxation techniques to limit stress.   Oxygen for Home/Travel: - Discuss how to prepare for travel when on oxygen and proper ways to transport and store oxygen to ensure safety.   Knowledge Questionnaire Score:  Knowledge Questionnaire Score - 07/01/22 0905       Knowledge Questionnaire Score   Pre Score 15/18             Core Components/Risk Factors/Patient Goals at Admission:  Personal Goals and Risk Factors at Admission - 07/01/22 0908        Core Components/Risk Factors/Patient Goals on Admission   Improve shortness of breath with ADL's Yes    Intervention Provide education, individualized exercise plan and daily activity instruction to help decrease symptoms of SOB with activities of daily living.    Expected Outcomes Short Term: Improve cardiorespiratory fitness to achieve a reduction of symptoms when performing ADLs;Long Term: Be able to perform more ADLs without symptoms or delay the onset of symptoms    Increase knowledge of respiratory medications and ability to use respiratory devices properly  Yes    Intervention Provide education and demonstration as needed of appropriate use of medications, inhalers, and oxygen therapy.    Expected Outcomes Short Term: Achieves understanding of medications use. Understands that oxygen is a medication prescribed by physician. Demonstrates appropriate use of inhaler and oxygen therapy.;Long Term: Maintain appropriate use of medications, inhalers, and oxygen therapy.    Personal Goal Other Yes    Personal Goal Improve  leg strength and stamina.    Intervention Attend PR two days per week and begin a home exercise plan.    Expected Outcomes Pt will reach stated goals.             Core Components/Risk Factors/Patient Goals Review:   Goals and Risk Factor Review     Row Name 07/02/22 0849 07/30/22 1410           Core Components/Risk Factors/Patient Goals Review   Personal Goals Review Improve shortness of breath with ADL's;Other Improve shortness of breath with ADL's;Other      Review Patient was referred to PR with COPD from [place]. He plans to strart the program tomorrow 07/03/22. His personal goals are to improve his strength and stamina in his legs. We will continue to monitor his progress as he works towards meeting these goals. Pt has completed 8 sessions.  He is progressing well.  VSS.  Sats are 91-96% on room air.  His personal goals are to improve his strength and stamina in  his legs. We will continue to monitor his progress as he works towards meeting these goals.      Expected Outcomes Patient will complete the program meeting both personal and program goals. Patient will complete the program meeting both personal and program goals.               Core Components/Risk Factors/Patient Goals at Discharge (Final Review):   Goals and Risk Factor Review - 07/30/22 1410       Core Components/Risk Factors/Patient Goals Review   Personal Goals Review Improve shortness of breath with ADL's;Other    Review Pt has completed 8 sessions.  He is progressing well.  VSS.  Sats are 91-96% on room air.  His personal goals are to improve his strength and stamina in his legs. We will continue to monitor his progress as he works towards meeting these goals.    Expected Outcomes Patient will complete the program meeting both personal and program goals.             ITP Comments:   Comments: ITP REVIEW Pt is making expected progress toward pulmonary rehab goals after completing 10 sessions. Recommend continued exercise, life style modification, education, and utilization of breathing techniques to increase stamina and strength and decrease shortness of breath with exertion.

## 2022-08-07 ENCOUNTER — Encounter (HOSPITAL_COMMUNITY)
Admission: RE | Admit: 2022-08-07 | Discharge: 2022-08-07 | Disposition: A | Discharge status: Home / Self Care | Payer: Medicare Other | Source: Ambulatory Visit | Attending: Internal Medicine | Admitting: Internal Medicine

## 2022-08-07 DIAGNOSIS — J449 Chronic obstructive pulmonary disease, unspecified: Secondary | ICD-10-CM

## 2022-08-07 NOTE — Progress Notes (Signed)
Daily Session Note  Patient Details  Name: Dennis [name] MRN: [id number] Date of Birth: 1951/11/17 Referring Provider:   Flowsheet Row PULMONARY REHAB COPD ORIENTATION from 07/01/2022 in [place]  Referring Provider Dr. Melvyn Novas       Encounter Date: 08/07/2022  Check In:  Session Check In - 08/07/22 1045       Check-In   Supervising physician immediately available to respond to emergencies [facility] MD immediately available    Physician(s) Dr. Johney Frame    Location [facility]    Staff Present Hoy Register, MS, ACSM-CEP, Exercise Physiologist;Heather Zigmund Daniel, Exercise Physiologist    Virtual Visit No    Medication changes reported     No    Fall or balance concerns reported    No    Tobacco Cessation No Change    Warm-up and Cool-down Performed as group-led instruction    Resistance Training Performed Yes    VAD Patient? No    PAD/SET Patient? No      Pain Assessment   Currently in Pain? No/denies    Pain Score 0-No pain    Multiple Pain Sites No             Capillary Blood Glucose: No results found for this or any previous visit (from the past 24 hour(s)).    Social History   Tobacco Use  Smoking Status Former   Packs/day: 2.00   Years: 27.00   Total pack years: 54.00   Types: Cigarettes   Start date: 02/01/1971   Quit date: 01/31/1998   Years since quitting: 24.5  Smokeless Tobacco Current   Types: Chew    Goals Met:  Proper associated with RPD/PD & O2 Sat Independence with exercise equipment Using PLB without cueing & demonstrates good technique Exercise tolerated well Queuing for purse lip breathing No report of concerns or symptoms today Strength training completed today  Goals Unmet:  Not Applicable  Comments: checkout time is 1145   Dr. Kathie Dike is Medical Director for [facility] Pulmonary Rehab.

## 2022-08-12 ENCOUNTER — Encounter (HOSPITAL_COMMUNITY)
Admission: RE | Admit: 2022-08-12 | Discharge: 2022-08-12 | Disposition: A | Discharge status: Home / Self Care | Payer: Medicare Other | Source: Ambulatory Visit | Attending: Internal Medicine | Admitting: Internal Medicine

## 2022-08-12 DIAGNOSIS — J449 Chronic obstructive pulmonary disease, unspecified: Secondary | ICD-10-CM | POA: Diagnosis not present

## 2022-08-12 NOTE — Progress Notes (Signed)
Daily Session Note  Patient Details  Name: Dennis [name] MRN: [id number] Date of Birth: May 28, 1951 Referring Provider:   Flowsheet Row PULMONARY REHAB COPD ORIENTATION from 07/01/2022 in [place]  Referring Provider Dr. Melvyn Novas       Encounter Date: 08/12/2022  Check In:  Session Check In - 08/12/22 1045       Check-In   Supervising physician immediately available to respond to emergencies [facility] MD immediately available    Physician(s) Dr Harl Bowie    Location [facility]    Staff Present Abimbola Aki Hassell Done, RN, Bjorn Loser, MS, ACSM-CEP, Exercise Physiologist    Virtual Visit No    Medication changes reported     No    Fall or balance concerns reported    No    Tobacco Cessation No Change    Warm-up and Cool-down Performed as group-led instruction    Resistance Training Performed Yes    VAD Patient? No    PAD/SET Patient? No      Pain Assessment   Currently in Pain? No/denies    Pain Score 0-No pain    Multiple Pain Sites No             Capillary Blood Glucose: No results found for this or any previous visit (from the past 24 hour(s)).    Social History   Tobacco Use  Smoking Status Former   Packs/day: 2.00   Years: 27.00   Total pack years: 54.00   Types: Cigarettes   Start date: 02/01/1971   Quit date: 01/31/1998   Years since quitting: 24.5  Smokeless Tobacco Current   Types: Chew    Goals Met:  Proper associated with RPD/PD & O2 Sat Independence with exercise equipment Using PLB without cueing & demonstrates good technique Exercise tolerated well Queuing for purse lip breathing No report of concerns or symptoms today Strength training completed today  Goals Unmet:  Not Applicable  Comments: Checkout at 1145.   Dr. Kathie Dike is Medical Director for [facility] Pulmonary Rehab.

## 2022-08-14 ENCOUNTER — Encounter (HOSPITAL_COMMUNITY)
Admission: RE | Admit: 2022-08-14 | Discharge: 2022-08-14 | Disposition: A | Discharge status: Home / Self Care | Payer: Medicare Other | Source: Ambulatory Visit | Attending: Internal Medicine | Admitting: Internal Medicine

## 2022-08-14 DIAGNOSIS — J449 Chronic obstructive pulmonary disease, unspecified: Secondary | ICD-10-CM

## 2022-08-14 NOTE — Progress Notes (Signed)
Daily Session Note  Patient Details  Name: Dennis [name] MRN: [id number] Date of Birth: 06/14/1951 Referring Provider:   Flowsheet Row PULMONARY REHAB COPD ORIENTATION from 07/01/2022 in [place]  Referring Provider Dr. Melvyn Novas       Encounter Date: 08/14/2022  Check In:  Session Check In - 08/14/22 1040       Check-In   Supervising physician immediately available to respond to emergencies [facility] MD immediately available    Physician(s) Dr Harrington Challenger    Location [facility]    Staff Present Odel Schmid Hassell Done, RN, Bjorn Loser, MS, ACSM-CEP, Exercise Physiologist    Virtual Visit No    Medication changes reported     No    Fall or balance concerns reported    No    Tobacco Cessation No Change    Warm-up and Cool-down Performed as group-led instruction    Resistance Training Performed Yes    VAD Patient? No    PAD/SET Patient? No      Pain Assessment   Currently in Pain? No/denies    Pain Score 0-No pain    Multiple Pain Sites No             Capillary Blood Glucose: No results found for this or any previous visit (from the past 24 hour(s)).    Social History   Tobacco Use  Smoking Status Former   Packs/day: 2.00   Years: 27.00   Total pack years: 54.00   Types: Cigarettes   Start date: 02/01/1971   Quit date: 01/31/1998   Years since quitting: 24.5  Smokeless Tobacco Current   Types: Chew    Goals Met:  Proper associated with RPD/PD & O2 Sat Independence with exercise equipment Using PLB without cueing & demonstrates good technique Exercise tolerated well Queuing for purse lip breathing No report of concerns or symptoms today Strength training completed today  Goals Unmet:  Not Applicable  Comments: Checkout at 1145.   Dr. Kathie Dike is Medical Director for [facility] Pulmonary Rehab.

## 2022-08-17 ENCOUNTER — Other Ambulatory Visit: Payer: Self-pay | Admitting: Internal Medicine

## 2022-08-17 DIAGNOSIS — F419 Anxiety disorder, unspecified: Secondary | ICD-10-CM

## 2022-08-19 ENCOUNTER — Encounter (HOSPITAL_COMMUNITY)
Admission: RE | Admit: 2022-08-19 | Discharge: 2022-08-19 | Disposition: A | Discharge status: Home / Self Care | Payer: Medicare Other | Source: Ambulatory Visit | Attending: Internal Medicine | Admitting: Internal Medicine

## 2022-08-19 DIAGNOSIS — J449 Chronic obstructive pulmonary disease, unspecified: Secondary | ICD-10-CM

## 2022-08-19 NOTE — Progress Notes (Signed)
Daily Session Note  Patient Details  Name: Dennis [name] MRN: [id number] Date of Birth: 06/22/1951 Referring Provider:   Flowsheet Row PULMONARY REHAB COPD ORIENTATION from 07/01/2022 in [place] CARDIAC REHABILITATION  Referring Provider Dr. Wert       Encounter Date: 08/19/2022  Check In:  Session Check In - 08/19/22 1045       Check-In   Supervising physician immediately available to respond to emergencies [facility] MD immediately available    Physician(s) Dr. Pemberton    Location [facility]    Staff Present Heather Jachimiak, BS, Exercise Physiologist;Phyllis Billingsley, RN;Daphyne Martin, RN, BSN    Virtual Visit No    Medication changes reported     No    Fall or balance concerns reported    No    Tobacco Cessation No Change    Warm-up and Cool-down Not performed (comment)    Resistance Training Performed Yes    VAD Patient? No    PAD/SET Patient? No      Pain Assessment   Currently in Pain? No/denies    Pain Score 0-No pain    Multiple Pain Sites No             Capillary Blood Glucose: No results found for this or any previous visit (from the past 24 hour(s)).    Social History   Tobacco Use  Smoking Status Former   Packs/day: 2.00   Years: 27.00   Total pack years: 54.00   Types: Cigarettes   Start date: 02/01/1971   Quit date: 01/31/1998   Years since quitting: 24.5  Smokeless Tobacco Current   Types: Chew    Goals Met:  Independence with exercise equipment Exercise tolerated well No report of concerns or symptoms today Strength training completed today  Goals Unmet:  Not Applicable  Comments: check out 1145   Dr. Jehanzeb Memon is Medical Director for  Pulmonary Rehab. 

## 2022-08-21 ENCOUNTER — Encounter (HOSPITAL_COMMUNITY)
Admission: RE | Admit: 2022-08-21 | Discharge: 2022-08-21 | Disposition: A | Discharge status: Home / Self Care | Payer: Medicare Other | Source: Ambulatory Visit | Attending: Internal Medicine | Admitting: Internal Medicine

## 2022-08-21 DIAGNOSIS — J449 Chronic obstructive pulmonary disease, unspecified: Secondary | ICD-10-CM

## 2022-08-21 NOTE — Progress Notes (Signed)
Daily Session Note  Patient Details  Name: Dennis [name] MRN: [id number] Date of Birth: 15-May-1951 Referring Provider:   Flowsheet Row PULMONARY REHAB COPD ORIENTATION from 07/01/2022 in [place]  Referring Provider Dr. Melvyn Novas       Encounter Date: 08/21/2022  Check In:  Session Check In - 08/21/22 1045       Check-In   Supervising physician immediately available to respond to emergencies [facility] MD immediately available    Physician(s) Dr. Harl Bowie    Location [facility]    Staff Present Hoy Register, MS, ACSM-CEP, Exercise Physiologist;Heather Zigmund Daniel, Exercise Physiologist    Virtual Visit No    Medication changes reported     No    Fall or balance concerns reported    No    Tobacco Cessation No Change    Warm-up and Cool-down Performed as group-led instruction    Resistance Training Performed Yes    VAD Patient? No    PAD/SET Patient? No      Pain Assessment   Currently in Pain? No/denies    Pain Score 0-No pain    Multiple Pain Sites No             Capillary Blood Glucose: No results found for this or any previous visit (from the past 24 hour(s)).    Social History   Tobacco Use  Smoking Status Former   Packs/day: 2.00   Years: 27.00   Total pack years: 54.00   Types: Cigarettes   Start date: 02/01/1971   Quit date: 01/31/1998   Years since quitting: 24.5  Smokeless Tobacco Current   Types: Chew    Goals Met:  Proper associated with RPD/PD & O2 Sat Independence with exercise equipment Using PLB without cueing & demonstrates good technique Exercise tolerated well Queuing for purse lip breathing No report of concerns or symptoms today Strength training completed today  Goals Unmet:  Not Applicable  Comments: checkout time is 1145   Dr. Kathie Dike is Medical Director for [facility] Pulmonary Rehab.

## 2022-08-26 ENCOUNTER — Encounter (HOSPITAL_COMMUNITY)
Admission: RE | Admit: 2022-08-26 | Discharge: 2022-08-26 | Disposition: A | Discharge status: Home / Self Care | Payer: Medicare Other | Source: Ambulatory Visit | Attending: Internal Medicine | Admitting: Internal Medicine

## 2022-08-26 DIAGNOSIS — J449 Chronic obstructive pulmonary disease, unspecified: Secondary | ICD-10-CM | POA: Diagnosis not present

## 2022-08-26 NOTE — Progress Notes (Signed)
Daily Session Note  Patient Details  Name: Dennis [name] MRN: [id number] Date of Birth: Jul 19, 1951 Referring Provider:   Flowsheet Row PULMONARY REHAB COPD ORIENTATION from 07/01/2022 in [place]  Referring Provider Dr. Melvyn Novas       Encounter Date: 08/26/2022  Check In:  Session Check In - 08/26/22 1044       Check-In   Supervising physician immediately available to respond to emergencies [facility] MD immediately available    Physician(s) Dr. Harl Bowie    Location [facility]    Staff Present Geanie Cooley, RN;Latese Dufault Hassell Done, RN, BSN;Heather Otho Ket, BS, Exercise Physiologist    Virtual Visit No    Medication changes reported     No    Fall or balance concerns reported    No    Tobacco Cessation No Change    Warm-up and Cool-down Performed as group-led instruction    Resistance Training Performed Yes    VAD Patient? No    PAD/SET Patient? No      Pain Assessment   Currently in Pain? No/denies    Pain Score 0-No pain    Multiple Pain Sites No             Capillary Blood Glucose: No results found for this or any previous visit (from the past 24 hour(s)).    Social History   Tobacco Use  Smoking Status Former   Packs/day: 2.00   Years: 27.00   Total pack years: 54.00   Types: Cigarettes   Start date: 02/01/1971   Quit date: 01/31/1998   Years since quitting: 24.5  Smokeless Tobacco Current   Types: Chew    Goals Met:  Proper associated with RPD/PD & O2 Sat Independence with exercise equipment Using PLB without cueing & demonstrates good technique Exercise tolerated well Queuing for purse lip breathing No report of concerns or symptoms today Strength training completed today  Goals Unmet:  Not Applicable  Comments: checkout at 1145.    Dr. Kathie Dike is Medical Director for [facility].

## 2022-08-28 ENCOUNTER — Encounter (HOSPITAL_COMMUNITY)
Admission: RE | Admit: 2022-08-28 | Discharge: 2022-08-28 | Disposition: A | Discharge status: Home / Self Care | Payer: Medicare Other | Source: Ambulatory Visit | Attending: Internal Medicine | Admitting: Internal Medicine

## 2022-08-28 DIAGNOSIS — J449 Chronic obstructive pulmonary disease, unspecified: Secondary | ICD-10-CM

## 2022-08-28 NOTE — Progress Notes (Signed)
Daily Session Note  Patient Details  Name: Dennis [name] MRN: [id number] Date of Birth: 10/22/1951 Referring Provider:   Flowsheet Row PULMONARY REHAB COPD ORIENTATION from 07/01/2022 in [place]  Referring Provider Dr. Melvyn Novas       Encounter Date: 08/28/2022  Check In:  Session Check In - 08/28/22 1045       Check-In   Supervising physician immediately available to respond to emergencies [facility] MD immediately available    Physician(s) Dr. Harrington Challenger    Location [facility]    Staff Present Hoy Register, MS, ACSM-CEP, Exercise Physiologist;Heather Zigmund Daniel, Exercise Physiologist    Virtual Visit No    Medication changes reported     No    Fall or balance concerns reported    No    Tobacco Cessation No Change    Warm-up and Cool-down Performed as group-led instruction    Resistance Training Performed Yes    VAD Patient? No    PAD/SET Patient? No      Pain Assessment   Currently in Pain? No/denies    Pain Score 0-No pain    Multiple Pain Sites No             Capillary Blood Glucose: No results found for this or any previous visit (from the past 24 hour(s)).    Social History   Tobacco Use  Smoking Status Former   Packs/day: 2.00   Years: 27.00   Total pack years: 54.00   Types: Cigarettes   Start date: 02/01/1971   Quit date: 01/31/1998   Years since quitting: 24.5  Smokeless Tobacco Current   Types: Chew    Goals Met:  Proper associated with RPD/PD & O2 Sat Independence with exercise equipment Using PLB without cueing & demonstrates good technique Exercise tolerated well Queuing for purse lip breathing No report of concerns or symptoms today Strength training completed today  Goals Unmet:  Not Applicable  Comments: checkout time is 1145   Dr. Kathie Dike is Medical Director for [facility].

## 2022-09-02 ENCOUNTER — Encounter (HOSPITAL_COMMUNITY)
Admission: RE | Admit: 2022-09-02 | Discharge: 2022-09-02 | Disposition: A | Discharge status: Home / Self Care | Payer: Medicare Other | Source: Ambulatory Visit | Attending: Internal Medicine | Admitting: Internal Medicine

## 2022-09-02 VITALS — Wt 220.2 lb

## 2022-09-02 DIAGNOSIS — J449 Chronic obstructive pulmonary disease, unspecified: Secondary | ICD-10-CM | POA: Diagnosis not present

## 2022-09-02 NOTE — Progress Notes (Signed)
Daily Session Note  Patient Details  Name: Dennis [name] MRN: [id number] Date of Birth: Dec 08, 1950 Referring Provider:   Flowsheet Row PULMONARY REHAB COPD ORIENTATION from 07/01/2022 in [place]  Referring Provider Dr. Melvyn Novas       Encounter Date: 09/02/2022  Check In:  Session Check In - 09/02/22 1045       Check-In   Supervising physician immediately available to respond to emergencies [facility] MD immediately available    Physician(s) Dr Marlou Porch    Location [facility]    Staff Present Hoy Register, MS, ACSM-CEP, Exercise Physiologist;Heather Zigmund Daniel, Exercise Physiologist;Lawsen Arnott Hassell Done, RN, BSN    Virtual Visit No    Medication changes reported     No    Fall or balance concerns reported    No    Tobacco Cessation No Change    Warm-up and Cool-down Performed as group-led instruction    Resistance Training Performed Yes    VAD Patient? No    PAD/SET Patient? No      Pain Assessment   Currently in Pain? No/denies    Pain Score 0-No pain    Multiple Pain Sites No             Capillary Blood Glucose: No results found for this or any previous visit (from the past 24 hour(s)).    Social History   Tobacco Use  Smoking Status Former   Packs/day: 2.00   Years: 27.00   Total pack years: 54.00   Types: Cigarettes   Start date: 02/01/1971   Quit date: 01/31/1998   Years since quitting: 24.6  Smokeless Tobacco Current   Types: Chew    Goals Met:  Proper associated with RPD/PD & O2 Sat Independence with exercise equipment Using PLB without cueing & demonstrates good technique Exercise tolerated well Queuing for purse lip breathing No report of concerns or symptoms today Strength training completed today  Goals Unmet:  Not Applicable  Comments: Checkout at 1145.   Dr. Kathie Dike is Medical Director for [facility].

## 2022-09-03 NOTE — Progress Notes (Signed)
Pulmonary Individual Treatment Plan  Patient Details  Name: Dennis [name] MRN: [id number] Date of Birth: [birth year] Referring Provider:   Flowsheet Row PULMONARY REHAB COPD ORIENTATION from 07/01/2022 in [place]  Referring Provider Dr. 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[place])  Patient's Home Medications on Admission:   Current Outpatient Medications:    albuterol (PROVENTIL) (2.5 MG/3ML) 0.083% nebulizer solution, Take 3 mLs (2.5 mg total) by nebulization every 6 (six) hours as needed for wheezing or shortness of breath., Disp: 150 mL, Rfl: 3   albuterol (VENTOLIN HFA) 108 (90 Base) MCG/ACT inhaler, Inhale 2 puffs into the lungs every 4 (four) hours as needed for wheezing., Disp: 18 g, Rfl: 5   ALPRAZolam (XANAX) 0.5 MG tablet, TAKE 1 TABLET BY MOUTH 2 TIMES DAILY AS NEEDED FOR ANXIETY., Disp: 30 tablet, Rfl: 3   aspirin EC 81 MG tablet, Take 1 tablet (81 mg total) by mouth daily. (Patient taking differently: Take 81 mg by mouth 2 (two) times a week.), Disp: 30 tablet, Rfl: 11   budesonide-formoterol (SYMBICORT) 160-4.5 MCG/ACT inhaler, TAKE 2 PUFFS FIRST THING IN MORNING AND THEN ANOTHER 2 PUFFS ABOUT 12 HOURS LATER., Disp: 30.6 each, Rfl: 1   ezetimibe-simvastatin (VYTORIN) 10-40 MG tablet, TAKE 1 TABLET BY MOUTH AT BEDTIME. TAKE 1 TABLET BY MOUTH EVERYDAY AT BEDTIME, Disp: 90 tablet, Rfl: 1   fenofibrate 160 MG tablet, TAKE 1 TABLET BY MOUTH EVERY DAY, Disp: 90 tablet, Rfl: 1   fish oil-omega-3 fatty acids 1000 MG capsule, Take 1 g by mouth in the morning and at bedtime., Disp: , Rfl:    guaiFENesin (MUCINEX) 600 MG 12 hr tablet, Take 1 tablet (600 mg total) by mouth 2 (two) times daily., Disp: 20 tablet, Rfl: 0   losartan-hydrochlorothiazide (HYZAAR) 100-25 MG tablet, TAKE 1 TABLET BY MOUTH EVERY DAY,  Disp: 90 tablet, Rfl: 0   metoprolol tartrate (LOPRESSOR) 50 MG tablet, TAKE 1 TABLET BY MOUTH TWICE A DAY, Disp: 180 tablet, Rfl: 1   mupirocin ointment (BACTROBAN) 2 %, Apply 1 application topically 2 (two) times daily. (Patient taking differently: Apply 1 application  topically 2 (two) times daily as needed (wound care).), Disp: 22 g, Rfl: 0   nitroGLYCERIN (NITROSTAT) 0.4 MG SL tablet, PLACE 1 TABLET (0.4 MG TOTAL) UNDER THE TONGUE EVERY 5 (FIVE) MINUTES AS NEEDED., Disp: 25 tablet, Rfl: 3   Respiratory Therapy Supplies (NEBULIZER/TUBING/MOUTHPIECE) KIT, Nebulizer tubing and mouthpiece, Disp: 1 kit, Rfl: 0   tamsulosin (FLOMAX) 0.4 MG CAPS capsule, Take 1 capsule (0.4 mg total) by mouth daily., Disp: 90 capsule, Rfl: 3   Vitamin D, Ergocalciferol, (DRISDOL) 1.25 MG (50000 UNIT) CAPS capsule, Take 1 capsule (50,000 Units total) by mouth every 7 (seven) days., Disp: 12 capsule, Rfl: 1   zolpidem (AMBIEN) 10 MG tablet, Take 1 tablet (10 mg total) by mouth at bedtime as needed for sleep., Disp: 30 tablet, Rfl: 3  Past Medical History: Past Medical History:  Diagnosis Date   Atrial flutter ([place])    Diagnosed by ECG August 2015   Colitis    COPD (chronic obstructive pulmonary disease) ([place])    Coronary atherosclerosis of native coronary artery    Multivessel status post CABG   Essential hypertension    Hyperlipidemia    Insomnia     Tobacco  Use: Social History   Tobacco Use  Smoking Status Former   Packs/day: 2.00   Years: 27.00   Total pack years: 54.00   Types: Cigarettes   Start date: 02/01/1971   Quit date: 01/31/1998   Years since quitting: 24.6  Smokeless Tobacco Current   Types: Chew    Labs: Review Flowsheet  More data exists      Latest Ref Rng & Units 07/23/2020 12/26/2020 07/03/2021 02/26/2022 07/08/2022  Labs for ITP Cardiac and Pulmonary Rehab  Cholestrol 100 - 199 mg/dL 299  280  232  - 238   LDL (calc) 0 - 99 mg/dL 192  193  162  - 161   HDL-C >39 mg/dL 43  42  43   - 43   Trlycerides 0 - 149 mg/dL 323  234  146  - 183   Hemoglobin A1c 4.8 - 5.6 % - 5.7  6.0  5.9  -    Capillary Blood Glucose: Lab Results  Component Value Date   GLUCAP 177 (H) 03/26/2020     Pulmonary Assessment Scores:  Pulmonary Assessment Scores     Row Name 07/01/22 0852         ADL UCSD   SOB Score total 40     Rest 1     Walk 2     Stairs 5     Bath 1     Dress 1     Shop 1       CAT Score   CAT Score 24       mMRC Score   mMRC Score 3             UCSD: Self-administered rating of dyspnea associated with activities of daily living (ADLs) 6-point scale (0 = "not at all" to 5 = "maximal or unable to do because of breathlessness")  Scoring Scores range from 0 to 120.  Minimally important difference is 5 units  CAT: CAT can identify the health impairment of COPD patients and is better correlated with disease progression.  CAT has a scoring range of zero to 40. The CAT score is classified into four groups of low (less than 10), medium (10 - 20), high (21-30) and very high (31-40) based on the impact level of disease on health status. A CAT score over 10 suggests significant symptoms.  A worsening CAT score could be explained by an exacerbation, poor medication adherence, poor inhaler technique, or progression of COPD or comorbid conditions.  CAT MCID is 2 points  mMRC: mMRC (Modified Medical Research Council) Dyspnea Scale is used to assess the degree of baseline functional disability in patients of respiratory disease due to dyspnea. No minimal important difference is established. A decrease in score of 1 point or greater is considered a positive change.   Pulmonary Function Assessment:   Exercise Target Goals: Exercise Program Goal: Individual exercise prescription set using results from initial 6 min walk test and THRR while considering  patient's activity barriers and safety.   Exercise Prescription Goal: Initial exercise prescription builds to  30-45 minutes a day of aerobic activity, 2-3 days per week.  Home exercise guidelines will be given to patient during program as part of exercise prescription that the participant will acknowledge.  Activity Barriers & Risk Stratification:  Activity Barriers & Cardiac Risk Stratification - 07/01/22 0855       Activity Barriers & Cardiac Risk Stratification   Activity Barriers Other (comment)    Comments peripheral vascular disease  Cardiac Risk Stratification High             6 Minute Walk:  6 Minute Walk     Row Name 07/01/22 0947         6 Minute Walk   Phase Initial     Distance 1200 feet     Walk Time 6 minutes     # of Rest Breaks 0     MPH 2.27     METS 2.56     RPE 15     Perceived Dyspnea  11     VO2 Peak 8.96     Symptoms No     Resting HR 55 bpm     Resting BP 102/70     Resting Oxygen Saturation  93 %     Exercise Oxygen Saturation  during 6 min walk 93 %     Max Ex. HR 74 bpm     Max Ex. BP 138/70     2 Minute Post BP 120/68       Interval HR   1 Minute HR 62     2 Minute HR 64     3 Minute HR 65     4 Minute HR 70     5 Minute HR 70     6 Minute HR 74     2 Minute Post HR 61     Interval Heart Rate? Yes       Interval Oxygen   Interval Oxygen? Yes     Baseline Oxygen Saturation % 93 %     1 Minute Oxygen Saturation % 93 %     1 Minute Liters of Oxygen 0 L     2 Minute Oxygen Saturation % 93 %     2 Minute Liters of Oxygen 0 L     3 Minute Oxygen Saturation % 93 %     3 Minute Liters of Oxygen 0 L     4 Minute Oxygen Saturation % 93 %     4 Minute Liters of Oxygen 0 L     5 Minute Oxygen Saturation % 93 %     5 Minute Liters of Oxygen 0 L     6 Minute Oxygen Saturation % 93 %     6 Minute Liters of Oxygen 0 L     2 Minute Post Oxygen Saturation % 95 %     2 Minute Post Liters of Oxygen 0 L              Oxygen Initial Assessment:  Oxygen Initial Assessment - 07/01/22 0946       Home Oxygen   Home Oxygen Device None     Sleep Oxygen Prescription None    Home Exercise Oxygen Prescription None    Home Resting Oxygen Prescription None    Compliance with Home Oxygen Use Yes      Initial 6 min Walk   Oxygen Used None      Program Oxygen Prescription   Program Oxygen Prescription None      Intervention   Short Term Goals To learn and understand importance of monitoring SPO2 with pulse oximeter and demonstrate accurate use of the pulse oximeter.;To learn and understand importance of maintaining oxygen saturations>88%;To learn and demonstrate proper pursed lip breathing techniques or other breathing techniques.     Long  Term Goals Verbalizes importance of monitoring SPO2 with pulse oximeter and return demonstration;Maintenance of O2 saturations>88%;Exhibits proper breathing techniques, such as pursed  lip breathing or other method taught during program session;Compliance with respiratory medication             Oxygen Re-Evaluation:  Oxygen Re-Evaluation     Row Name 07/08/22 1253 08/05/22 1152 09/02/22 1256         Program Oxygen Prescription   Program Oxygen Prescription None None None       Home Oxygen   Home Oxygen Device None None None     Sleep Oxygen Prescription None None None     Home Exercise Oxygen Prescription None None None     Home Resting Oxygen Prescription None None None     Compliance with Home Oxygen Use Yes Yes Yes       Goals/Expected Outcomes   Short Term Goals To learn and understand importance of monitoring SPO2 with pulse oximeter and demonstrate accurate use of the pulse oximeter.;To learn and understand importance of maintaining oxygen saturations>88%;To learn and demonstrate proper pursed lip breathing techniques or other breathing techniques.  To learn and understand importance of monitoring SPO2 with pulse oximeter and demonstrate accurate use of the pulse oximeter.;To learn and understand importance of maintaining oxygen saturations>88%;To learn and demonstrate proper  pursed lip breathing techniques or other breathing techniques.  To learn and understand importance of monitoring SPO2 with pulse oximeter and demonstrate accurate use of the pulse oximeter.;To learn and understand importance of maintaining oxygen saturations>88%;To learn and demonstrate proper pursed lip breathing techniques or other breathing techniques.      Long  Term Goals Verbalizes importance of monitoring SPO2 with pulse oximeter and return demonstration;Maintenance of O2 saturations>88%;Exhibits proper breathing techniques, such as pursed lip breathing or other method taught during program session;Compliance with respiratory medication Verbalizes importance of monitoring SPO2 with pulse oximeter and return demonstration;Maintenance of O2 saturations>88%;Exhibits proper breathing techniques, such as pursed lip breathing or other method taught during program session;Compliance with respiratory medication Verbalizes importance of monitoring SPO2 with pulse oximeter and return demonstration;Maintenance of O2 saturations>88%;Exhibits proper breathing techniques, such as pursed lip breathing or other method taught during program session;Compliance with respiratory medication     Goals/Expected Outcomes Compliance Compliance Compliance              Oxygen Discharge (Final Oxygen Re-Evaluation):  Oxygen Re-Evaluation - 09/02/22 1256       Program Oxygen Prescription   Program Oxygen Prescription None      Home Oxygen   Home Oxygen Device None    Sleep Oxygen Prescription None    Home Exercise Oxygen Prescription None    Home Resting Oxygen Prescription None    Compliance with Home Oxygen Use Yes      Goals/Expected Outcomes   Short Term Goals To learn and understand importance of monitoring SPO2 with pulse oximeter and demonstrate accurate use of the pulse oximeter.;To learn and understand importance of maintaining oxygen saturations>88%;To learn and demonstrate proper pursed lip breathing  techniques or other breathing techniques.     Long  Term Goals Verbalizes importance of monitoring SPO2 with pulse oximeter and return demonstration;Maintenance of O2 saturations>88%;Exhibits proper breathing techniques, such as pursed lip breathing or other method taught during program session;Compliance with respiratory medication    Goals/Expected Outcomes Compliance             Initial Exercise Prescription:  Initial Exercise Prescription - 07/01/22 0900       Date of Initial Exercise RX and Referring Provider   Date 07/01/22    Referring Provider Dr. Melvyn Novas    Expected  Discharge Date 11/04/22      Recumbant Elliptical   Level 1    RPM 45    Minutes 39      Prescription Details   Frequency (times per week) 2    Duration Progress to 30 minutes of continuous aerobic without signs/symptoms of physical distress      Intensity   THRR 40-80% of Max Heartrate 60-121    Ratings of Perceived Exertion 11-13    Perceived Dyspnea 0-4      Resistance Training   Training Prescription Yes    Weight 4    Reps 10-15             Perform Capillary Blood Glucose checks as needed.  Exercise Prescription Changes:   Exercise Prescription Changes     Row Name 07/08/22 1200 07/22/22 1200 08/05/22 1100 08/21/22 1300 09/02/22 1200     Response to Exercise   Blood Pressure (Admit) 126/70 148/72 138/78 134/74 124/74   Blood Pressure (Exercise) 124/70 126/72 140/78 140/70 130/72   Blood Pressure (Exit) 108/68 110/72 106/64 108/60 120/70   Heart Rate (Admit) 60 bpm 58 bpm 63 bpm 55 bpm 59 bpm   Heart Rate (Exercise) 66 bpm 65 bpm 60 bpm 92 bpm 59 bpm   Heart Rate (Exit) 65 bpm 62 bpm 64 bpm 94 bpm 65 bpm   Oxygen Saturation (Admit) 92 % 93 % 96 % 96 % 91 %   Oxygen Saturation (Exercise) 93 % 93 % 92 % 92 % 92 %   Oxygen Saturation (Exit) 95 % 95 % 96 % 96 % 95 %   Rating of Perceived Exertion (Exercise) _0 Perceived Dyspnea (Exercise) _1 Duration  Continue with 30 min of aerobic exercise without signs/symptoms of physical distress. Continue with 30 min of aerobic exercise without signs/symptoms of physical distress. Continue with 30 min of aerobic exercise without signs/symptoms of physical distress. Continue with 30 min of aerobic exercise without signs/symptoms of physical distress. Continue with 30 min of aerobic exercise without signs/symptoms of physical distress.   Intensity _2      Progression   Progression Continue to progress workloads to maintain intensity without signs/symptoms of physical distress. Continue to progress workloads to maintain intensity without signs/symptoms of physical distress. Continue to progress workloads to maintain intensity without signs/symptoms of physical distress. Continue to progress workloads to maintain intensity without signs/symptoms of physical distress. Continue to progress workloads to maintain intensity without signs/symptoms of physical distress.     Resistance Training   Training Prescription _3    Weight _4 Reps 10-15 10-15 10-15 10-15 10-15   Time 10 Minutes 10 Minutes 10 Minutes 10 Minutes 10 Minutes     Recumbant Elliptical   Level _5 RPM 55 45 40 42 32   Minutes 39 39 39 39 39   METs 3 3.1 2.2 2.6 1.7            Exercise Comments:   Exercise Goals and Review:   Exercise Goals     Row Name 07/01/22 8676 07/08/22 1250 08/05/22 1148 09/02/22 1251       Exercise Goals   Increase Physical Activity Yes Yes Yes Yes    Intervention Provide advice, education, support and counseling about physical activity/exercise needs.;Develop an individualized exercise prescription for aerobic and resistive  training based on initial evaluation findings, risk stratification, comorbidities and participant's personal goals. Provide advice, education, support and counseling about physical  activity/exercise needs.;Develop an individualized exercise prescription for aerobic and resistive training based on initial evaluation findings, risk stratification, comorbidities and participant's personal goals. Provide advice, education, support and counseling about physical activity/exercise needs.;Develop an individualized exercise prescription for aerobic and resistive training based on initial evaluation findings, risk stratification, comorbidities and participant's personal goals. Provide advice, education, support and counseling about physical activity/exercise needs.;Develop an individualized exercise prescription for aerobic and resistive training based on initial evaluation findings, risk stratification, comorbidities and participant's personal goals.    Expected Outcomes Short Term: Attend rehab on a regular basis to increase amount of physical activity.;Long Term: Add in home exercise to make exercise part of routine and to increase amount of physical activity.;Long Term: Exercising regularly at least 3-5 days a week. Short Term: Attend rehab on a regular basis to increase amount of physical activity.;Long Term: Add in home exercise to make exercise part of routine and to increase amount of physical activity.;Long Term: Exercising regularly at least 3-5 days a week. Short Term: Attend rehab on a regular basis to increase amount of physical activity.;Long Term: Add in home exercise to make exercise part of routine and to increase amount of physical activity.;Long Term: Exercising regularly at least 3-5 days a week. Short Term: Attend rehab on a regular basis to increase amount of physical activity.;Long Term: Add in home exercise to make exercise part of routine and to increase amount of physical activity.;Long Term: Exercising regularly at least 3-5 days a week.    Increase Strength and Stamina Yes Yes Yes Yes    Intervention Provide advice, education, support and counseling about physical  activity/exercise needs.;Develop an individualized exercise prescription for aerobic and resistive training based on initial evaluation findings, risk stratification, comorbidities and participant's personal goals. Provide advice, education, support and counseling about physical activity/exercise needs.;Develop an individualized exercise prescription for aerobic and resistive training based on initial evaluation findings, risk stratification, comorbidities and participant's personal goals. Provide advice, education, support and counseling about physical activity/exercise needs.;Develop an individualized exercise prescription for aerobic and resistive training based on initial evaluation findings, risk stratification, comorbidities and participant's personal goals. Provide advice, education, support and counseling about physical activity/exercise needs.;Develop an individualized exercise prescription for aerobic and resistive training based on initial evaluation findings, risk stratification, comorbidities and participant's personal goals.    Expected Outcomes Short Term: Increase workloads from initial exercise prescription for resistance, speed, and METs.;Short Term: Perform resistance training exercises routinely during rehab and add in resistance training at home;Long Term: Improve cardiorespiratory fitness, muscular endurance and strength as measured by increased METs and functional capacity (6MWT) Short Term: Increase workloads from initial exercise prescription for resistance, speed, and METs.;Short Term: Perform resistance training exercises routinely during rehab and add in resistance training at home;Long Term: Improve cardiorespiratory fitness, muscular endurance and strength as measured by increased METs and functional capacity (6MWT) Short Term: Increase workloads from initial exercise prescription for resistance, speed, and METs.;Short Term: Perform resistance training exercises routinely during rehab  and add in resistance training at home;Long Term: Improve cardiorespiratory fitness, muscular endurance and strength as measured by increased METs and functional capacity (6MWT) Short Term: Increase workloads from initial exercise prescription for resistance, speed, and METs.;Short Term: Perform resistance training exercises routinely during rehab and add in resistance training at home;Long Term: Improve cardiorespiratory fitness, muscular endurance and strength as measured by increased METs and functional  capacity (6MWT)    Able to understand and use rate of perceived exertion (RPE) scale Yes Yes Yes Yes    Intervention Provide education and explanation on how to use RPE scale Provide education and explanation on how to use RPE scale Provide education and explanation on how to use RPE scale Provide education and explanation on how to use RPE scale    Expected Outcomes Short Term: Able to use RPE daily in rehab to express subjective intensity level;Long Term:  Able to use RPE to guide intensity level when exercising independently Short Term: Able to use RPE daily in rehab to express subjective intensity level;Long Term:  Able to use RPE to guide intensity level when exercising independently Short Term: Able to use RPE daily in rehab to express subjective intensity level;Long Term:  Able to use RPE to guide intensity level when exercising independently Short Term: Able to use RPE daily in rehab to express subjective intensity level;Long Term:  Able to use RPE to guide intensity level when exercising independently    Able to understand and use Dyspnea scale Yes Yes Yes Yes    Intervention Provide education and explanation on how to use Dyspnea scale Provide education and explanation on how to use Dyspnea scale Provide education and explanation on how to use Dyspnea scale Provide education and explanation on how to use Dyspnea scale    Expected Outcomes Short Term: Able to use Dyspnea scale daily in rehab to  express subjective sense of shortness of breath during exertion;Long Term: Able to use Dyspnea scale to guide intensity level when exercising independently Short Term: Able to use Dyspnea scale daily in rehab to express subjective sense of shortness of breath during exertion;Long Term: Able to use Dyspnea scale to guide intensity level when exercising independently Short Term: Able to use Dyspnea scale daily in rehab to express subjective sense of shortness of breath during exertion;Long Term: Able to use Dyspnea scale to guide intensity level when exercising independently Short Term: Able to use Dyspnea scale daily in rehab to express subjective sense of shortness of breath during exertion;Long Term: Able to use Dyspnea scale to guide intensity level when exercising independently    Knowledge and understanding of Target Heart Rate Range (THRR) Yes -- Yes Yes    Intervention Provide education and explanation of THRR including how the numbers were predicted and where they are located for reference Provide education and explanation of THRR including how the numbers were predicted and where they are located for reference Provide education and explanation of THRR including how the numbers were predicted and where they are located for reference Provide education and explanation of THRR including how the numbers were predicted and where they are located for reference    Expected Outcomes Short Term: Able to state/look up THRR;Short Term: Able to use daily as guideline for intensity in rehab;Long Term: Able to use THRR to govern intensity when exercising independently Short Term: Able to state/look up THRR;Short Term: Able to use daily as guideline for intensity in rehab;Long Term: Able to use THRR to govern intensity when exercising independently Short Term: Able to state/look up THRR;Short Term: Able to use daily as guideline for intensity in rehab;Long Term: Able to use THRR to govern intensity when exercising  independently Short Term: Able to state/look up THRR;Short Term: Able to use daily as guideline for intensity in rehab;Long Term: Able to use THRR to govern intensity when exercising independently    Understanding of Exercise Prescription Yes Yes  Yes Yes    Intervention Provide education, explanation, and written materials on patient's individual exercise prescription Provide education, explanation, and written materials on patient's individual exercise prescription Provide education, explanation, and written materials on patient's individual exercise prescription Provide education, explanation, and written materials on patient's individual exercise prescription    Expected Outcomes Short Term: Able to explain program exercise prescription;Long Term: Able to explain home exercise prescription to exercise independently Short Term: Able to explain program exercise prescription;Long Term: Able to explain home exercise prescription to exercise independently Short Term: Able to explain program exercise prescription;Long Term: Able to explain home exercise prescription to exercise independently Short Term: Able to explain program exercise prescription;Long Term: Able to explain home exercise prescription to exercise independently             Exercise Goals Re-Evaluation :  Exercise Goals Re-Evaluation     Row Name 07/08/22 1251 08/05/22 1148 09/02/22 1251         Exercise Goal Re-Evaluation   Exercise Goals Review Increase Physical Activity;Increase Strength and Stamina;Able to understand and use rate of perceived exertion (RPE) scale;Able to understand and use Dyspnea scale;Able to check pulse independently;Understanding of Exercise Prescription Increase Physical Activity;Increase Strength and Stamina;Able to understand and use rate of perceived exertion (RPE) scale;Able to understand and use Dyspnea scale;Knowledge and understanding of Target Heart Rate Range (THRR);Understanding of Exercise  Prescription Increase Physical Activity;Increase Strength and Stamina;Able to understand and use rate of perceived exertion (RPE) scale;Able to understand and use Dyspnea scale;Knowledge and understanding of Target Heart Rate Range (THRR);Understanding of Exercise Prescription     Comments Pt has completed 2 sessions of PR. He is motivated during class and focused on increasing his distance on each class. He is currently exercising at 3.0 METs on the ellp. Will continue to monitor and progress as able. Pt has completed 9 sessions of PR. He is focued on increasing his level and distance for each class. He is currently exercising at 2.9 METs on the ellp. Pt also has leg pain when first increasing his levels, he stated he is to see a vein specialist soon. Will monitor and progres as able. Pt has completed 17 sessions of PR. He is focused on increasing his distance each class and has increased his level. He is currently exercising at 1.7 METs on the ellp. Will monitor and progress as able.     Expected Outcomes Through exercise at rehab and at home, the patient will meet their stated goals. Through exercise at rehab and at home, the patient will meet their stated goals. Through exercise at rehab and at home, the patient will meet their stated goals.              Discharge Exercise Prescription (Final Exercise Prescription Changes):  Exercise Prescription Changes - 09/02/22 1200       Response to Exercise   Blood Pressure (Admit) 124/74    Blood Pressure (Exercise) 130/72    Blood Pressure (Exit) 120/70    Heart Rate (Admit) 59 bpm    Heart Rate (Exercise) 59 bpm    Heart Rate (Exit) 65 bpm    Oxygen Saturation (Admit) 91 %    Oxygen Saturation (Exercise) 92 %    Oxygen Saturation (Exit) 95 %    Rating of Perceived Exertion (Exercise) 11    Perceived Dyspnea (Exercise) 11    Duration Continue with 30 min of aerobic exercise without signs/symptoms of physical distress.    Intensity THRR  unchanged  Progression   Progression Continue to progress workloads to maintain intensity without signs/symptoms of physical distress.      Resistance Training   Training Prescription Yes    Weight 5    Reps 10-15    Time 10 Minutes      Recumbant Elliptical   Level 4    RPM 32    Minutes 39    METs 1.7             Nutrition:  Target Goals: Understanding of nutrition guidelines, daily intake of sodium <1542m, cholesterol <2041m calories 30% from fat and 7% or less from saturated fats, daily to have 5 or more servings of fruits and vegetables.  Biometrics:  Pre Biometrics - 07/01/22 0953       Pre Biometrics   Height _0  (1.854 m)    Weight 99.5 kg    Waist Circumference 44 inches    Hip Circumference 43 inches    Waist to Hip Ratio 1.02 %    BMI (Calculated) 28.95    Triceps Skinfold 20 mm    % Body Fat 30.6 %    Grip Strength 45 kg    Flexibility 0 in    Single Leg Stand 4.35 seconds              Nutrition Therapy Plan and Nutrition Goals:  Nutrition Therapy & Goals - 07/02/22 0848       Personal Nutrition Goals   Comments Patient scored 57 on his diet assessment. We offer 2 educational sessions on heart healthy nutrition with handouts.      Intervention Plan   Intervention Nutrition handout(s) given to patient.    Expected Outcomes Short Term Goal: Understand basic principles of dietary content, such as calories, fat, sodium, cholesterol and nutrients.             Nutrition Assessments:  Nutrition Assessments - 07/01/22 0902       MEDFICTS Scores   Pre Score 57            MEDIFICTS Score Key: ?70 Need to make dietary changes  40-70 Heart Healthy Diet ? 40 Therapeutic Level Cholesterol Diet   Picture Your Plate Scores: <4<74nhealthy dietary pattern with much room for improvement. 41-50 Dietary pattern unlikely to meet recommendations for good health and room for improvement. 51-60 More healthful dietary pattern, with  some room for improvement.  >60 Healthy dietary pattern, although there may be some specific behaviors that could be improved.    Nutrition Goals Re-Evaluation:   Nutrition Goals Discharge (Final Nutrition Goals Re-Evaluation):   Psychosocial: Target Goals: Acknowledge presence or absence of significant depression and/or stress, maximize coping skills, provide positive support system. Participant is able to verbalize types and ability to use techniques and skills needed for reducing stress and depression.  Initial Review & Psychosocial Screening:  Initial Psych Review & Screening - 07/01/22 0859       Initial Review   Current issues with Current Sleep Concerns;Current Anxiety/Panic      Family Dynamics   Good Support System? Yes    Comments His support system includes his wife, sons, and grand children.      Barriers   Psychosocial barriers to participate in program There are no identifiable barriers or psychosocial needs.      Screening Interventions   Interventions Encouraged to exercise    Expected Outcomes Long Term goal: The participant improves quality of Life and PHQ9 Scores as seen by post scores and/or verbalization  of changes;Short Term goal: Identification and review with participant of any Quality of Life or Depression concerns found by scoring the questionnaire.             Quality of Life Scores:  Quality of Life - 07/01/22 0954       Quality of Life   Select Quality of Life      Quality of Life Scores   Health/Function Pre 17.13 %    Socioeconomic Pre 21.75 %    Psych/Spiritual Pre 19.93 %    Family Pre 26.4 %    GLOBAL Pre 19.97 %            Scores of 19 and below usually indicate a poorer quality of life in these areas.  A difference of  2-3 points is a clinically meaningful difference.  A difference of 2-3 points in the total score of the Quality of Life Index has been associated with significant improvement in overall quality of life,  self-image, physical symptoms, and general health in studies assessing change in quality of life.   PHQ-9: Review Flowsheet  More data exists      07/01/2022 06/09/2022 04/02/2022 03/24/2022 03/13/2022  Depression screen PHQ 2/9  Decreased Interest 3 0 0 0 0  Down, Depressed, Hopeless 1 0 0 0 0  PHQ - 2 Score 4 0 0 0 0  Altered sleeping 3 - - - -  Tired, decreased energy 3 - - - -  Change in appetite 1 - - - -  Feeling bad or failure about yourself  1 - - - -  Trouble concentrating 1 - - - -  Moving slowly or fidgety/restless 3 - - - -  Suicidal thoughts 0 - - - -  PHQ-9 Score 16 - - - -  Difficult doing work/chores Very difficult - - - -   Interpretation of Total Score  Total Score Depression Severity:  1-4 = Minimal depression, 5-9 = Mild depression, 10-14 = Moderate depression, 15-19 = Moderately severe depression, 20-27 = Severe depression   Psychosocial Evaluation and Intervention:  Psychosocial Evaluation - 07/01/22 0937       Psychosocial Evaluation & Interventions   Interventions Encouraged to exercise with the program and follow exercise prescription    Comments Pt has to participate in PR. He does have current anxiety and sleep concerns. He reports that he takes xanax PRN on days when he is "really on edge", but this is only about once per week. He states that a months supply of xanax lasts him 6-8 months. He does take ambien for his sleep, and he reports that this helps some, but he is unable to stay asleep for more than 2 hours at a time. 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 He scored a 16 on his PHQ-9, and he relates most of this to his peripheral vascular disease in his legs and his lack of sleep. He is no longer able to walk long distances due to his leg pains and fatigue. He denies have depression or any thoughs of harming himself. He reports that he has a good support system with  his wife, 3 sons, and 10 grandchildren. His goals while in the program are to improve his leg strength and stamina, so that he can be able to walk longer distances again. He has participated in the PR and CR programs before and reported that he  benefited from them, so he is eager to start the program again.    Expected Outcomes Pt's anxiety and sleep concerns will continue to be treated, and he will have no other identifiable psychosocial issues.    Continue Psychosocial Services  No Follow up required             Psychosocial Re-Evaluation:  Psychosocial Re-Evaluation     [place] Name 07/02/22 [phone] 07/30/22 1404 08/26/22 0838         Psychosocial Re-Evaluation   Current issues with Current Anxiety/Panic;Current Sleep Concerns Current Anxiety/Panic;Current Sleep Concerns Current Anxiety/Panic;Current Sleep Concerns     Comments Patient is new to the program. He plans to start tomorrow 8/10. We will continue to monitor his progress in the program. Patient has cpmpleted 8 sessions.  Reports having great support from family.  We will continue to monitor his progress in the program. Patient has completed 15 sessions.  Great support from wife.  Pt seems to be enjoying the program and is progression well.  Plan of care is ongoing.  No concerns as of present.     Expected Outcomes Patient will continue to have no psychosocial barriers and his anxiety and sleep will continue to be managed with Alprazolam and Ambien prn. Patient will continue to have no psychosocial barriers and his anxiety and sleep will continue to be managed with Alprazolam and Ambien prn. Patient will continue to have no psychosocial barriers.  Patients anxiety and sleep issues will continue to be managed with Alprazolam and Ambien prn.     Interventions Encouraged to attend Pulmonary Rehabilitation for the exercise;Relaxation education;Stress management education Encouraged to attend Pulmonary Rehabilitation for the exercise;Relaxation  education;Stress management education Encouraged to attend Pulmonary Rehabilitation for the exercise;Relaxation education;Stress management education     Continue Psychosocial Services  No Follow up required No Follow up required No Follow up required              Psychosocial Discharge (Final Psychosocial Re-Evaluation):  Psychosocial Re-Evaluation - 08/26/22 3244       Psychosocial Re-Evaluation   Current issues with Current Anxiety/Panic;Current Sleep Concerns    Comments Patient has completed 15 sessions.  Great support from wife.  Pt seems to be enjoying the program and is progression well.  Plan of care is ongoing.  No concerns as of present.    Expected Outcomes Patient will continue to have no psychosocial barriers.  Patients anxiety and sleep issues will continue to be managed with Alprazolam and Ambien prn.    Interventions Encouraged to attend Pulmonary Rehabilitation for the exercise;Relaxation education;Stress management education    Continue Psychosocial Services  No Follow up required              Education: Education Goals: Education classes will be provided on a weekly basis, covering required topics. Participant will state understanding/return demonstration of topics presented.  Learning Barriers/Preferences:  Learning Barriers/Preferences - 07/01/22 0102       Learning Barriers/Preferences   Learning Barriers None    Learning Preferences Skilled Demonstration;Individual Instruction             Education Topics: How Lungs Work and Diseases: - Discuss the anatomy of the lungs and diseases that can affect the lungs, such as COPD. Flowsheet Row PULMONARY REHAB CHRONIC OBSTRUCTIVE PULMONARY DISEASE from 08/28/2022 in [place]  Date 08/28/22  Educator DF  Instruction Review Code 1- Verbalizes Understanding       Exercise: -Discuss the importance of exercise, FITT principles  of exercise, normal and abnormal responses to  exercise, and how to exercise safely.   Environmental Irritants: -Discuss types of environmental irritants and how to limit exposure to environmental irritants.   Meds/Inhalers and oxygen: - Discuss respiratory medications, definition of an inhaler and oxygen, and the proper way to use an inhaler and oxygen.   Energy Saving Techniques: - Discuss methods to conserve energy and decrease shortness of breath when performing activities of daily living.    Bronchial Hygiene / Breathing Techniques: - Discuss breathing mechanics, pursed-lip breathing technique,  proper posture, effective ways to clear airways, and other functional breathing techniques   Cleaning Equipment: - Provides group verbal and written instruction about the health risks of elevated stress, cause of high stress, and healthy ways to reduce stress. Flowsheet Row PULMONARY REHAB CHRONIC OBSTRUCTIVE PULMONARY DISEASE from 08/28/2022 in [place]  Date 07/03/22  Educator DF  Instruction Review Code 2- Demonstrated Understanding       Nutrition I: Fats: - Discuss the types of cholesterol, what cholesterol does to the body, and how cholesterol levels can be controlled. Flowsheet Row PULMONARY REHAB CHRONIC OBSTRUCTIVE PULMONARY DISEASE from 08/28/2022 in [place]  Date 07/10/22  Educator Handout  Instruction Review Code 1- Verbalizes Understanding       Nutrition II: Labels: -Discuss the different components of food labels and how to read food labels. Flowsheet Row PULMONARY REHAB CHRONIC OBSTRUCTIVE PULMONARY DISEASE from 08/28/2022 in [place]  Date 07/17/22  Educator [place]  Instruction Review Code 1- Verbalizes Understanding       Respiratory Infections: - Discuss the signs and symptoms of respiratory infections, ways to prevent respiratory infections, and the importance of seeking medical treatment when having a respiratory  infection. Flowsheet Row PULMONARY REHAB CHRONIC OBSTRUCTIVE PULMONARY DISEASE from 08/28/2022 in [place]  Date 07/31/22  Educator Handout  Instruction Review Code 1- Verbalizes Understanding       Stress I: Signs and Symptoms: - Discuss the causes of stress, how stress may lead to anxiety and depression, and ways to limit stress. Flowsheet Row PULMONARY REHAB CHRONIC OBSTRUCTIVE PULMONARY DISEASE from 08/28/2022 in [place]  Date 08/07/22  Educator DF  Instruction Review Code 2- Demonstrated Understanding       Stress II: Relaxation: -Discuss relaxation techniques to limit stress. Flowsheet Row PULMONARY REHAB CHRONIC OBSTRUCTIVE PULMONARY DISEASE from 08/28/2022 in [place]  Date 08/14/22  Educator DM  Instruction Review Code 1- Verbalizes Understanding       Oxygen for Home/Travel: - Discuss how to prepare for travel when on oxygen and proper ways to transport and store oxygen to ensure safety. Flowsheet Row PULMONARY REHAB CHRONIC OBSTRUCTIVE PULMONARY DISEASE from 08/28/2022 in [place]  Date 08/21/22  Educator DF  Instruction Review Code 2- Demonstrated Understanding       Knowledge Questionnaire Score:  Knowledge Questionnaire Score - 07/01/22 0905       Knowledge Questionnaire Score   Pre Score 15/18             Core Components/Risk Factors/Patient Goals at Admission:  Personal Goals and Risk Factors at Admission - 07/01/22 0908       Core Components/Risk Factors/Patient Goals on Admission   Improve shortness of breath with ADL's Yes    Intervention Provide education, individualized exercise plan and daily activity instruction to help decrease symptoms of SOB with activities of daily living.    Expected Outcomes Short  Term: Improve cardiorespiratory fitness to achieve a reduction of symptoms when performing ADLs;Long Term: Be able to perform more ADLs  without symptoms or delay the onset of symptoms    Increase knowledge of respiratory medications and ability to use respiratory devices properly  Yes    Intervention Provide education and demonstration as needed of appropriate use of medications, inhalers, and oxygen therapy.    Expected Outcomes Short Term: Achieves understanding of medications use. Understands that oxygen is a medication prescribed by physician. Demonstrates appropriate use of inhaler and oxygen therapy.;Long Term: Maintain appropriate use of medications, inhalers, and oxygen therapy.    Personal Goal Other Yes    Personal Goal Improve leg strength and stamina.    Intervention Attend PR two days per week and begin a home exercise plan.    Expected Outcomes Pt will reach stated goals.             Core Components/Risk Factors/Patient Goals Review:   Goals and Risk Factor Review     Row Name 07/02/22 0849 07/30/22 1410 08/26/22 0903         Core Components/Risk Factors/Patient Goals Review   Personal Goals Review Improve shortness of breath with ADL's;Other Improve shortness of breath with ADL's;Other Improve shortness of breath with ADL's;Other     Review Patient was referred to PR with COPD from [place]. He plans to strart the program tomorrow 07/03/22. His personal goals are to improve his strength and stamina in his legs. We will continue to monitor his progress as he works towards meeting these goals. Pt has completed 8 sessions.  He is progressing well.  VSS.  Sats are 91-96% on room air.  His personal goals are to improve his strength and stamina in his legs. We will continue to monitor his progress as he works towards meeting these goals. Pt has completed 15 sessions.  VSS.  Sats are 90-97% on room air.  His personal goals are to improve his strength and stamina in his legs. We will continue to monitor his progress as he works towards meeting these goals.     Expected Outcomes Patient will complete the program meeting both  personal and program goals. Patient will complete the program meeting both personal and program goals. Patient will complete the program meeting both personal and program goals.              Core Components/Risk Factors/Patient Goals at Discharge (Final Review):   Goals and Risk Factor Review - 08/26/22 0903       Core Components/Risk Factors/Patient Goals Review   Personal Goals Review Improve shortness of breath with ADL's;Other    Review Pt has completed 15 sessions.  VSS.  Sats are 90-97% on room air.  His personal goals are to improve his strength and stamina in his legs. We will continue to monitor his progress as he works towards meeting these goals.    Expected Outcomes Patient will complete the program meeting both personal and program goals.             ITP Comments:   Comments: ITP REVIEW Pt is making expected progress toward pulmonary rehab goals after completing 18 sessions. Recommend continued exercise, life style modification, education, and utilization of breathing techniques to increase stamina and strength and decrease shortness of breath with exertion.

## 2022-09-04 ENCOUNTER — Encounter (HOSPITAL_COMMUNITY)
Admission: RE | Admit: 2022-09-04 | Discharge: 2022-09-04 | Disposition: A | Discharge status: Home / Self Care | Payer: Medicare Other | Source: Ambulatory Visit | Attending: Internal Medicine | Admitting: Internal Medicine

## 2022-09-04 DIAGNOSIS — J449 Chronic obstructive pulmonary disease, unspecified: Secondary | ICD-10-CM

## 2022-09-04 NOTE — Progress Notes (Signed)
Daily Session Note  Patient Details  Name: Dennis [name] MRN: [id number] Date of Birth: 12-04-1950 Referring Provider:   Flowsheet Row PULMONARY REHAB COPD ORIENTATION from 07/01/2022 in [place]  Referring Provider Dr. Melvyn Novas       Encounter Date: 09/04/2022  Check In:  Session Check In - 09/04/22 1045       Check-In   Supervising physician immediately available to respond to emergencies [facility] MD immediately available    Physician(s) Dr. Gardiner Rhyme    Location [facility]    Staff Present Redge Gainer, BS, Exercise Physiologist;Daphyne Hassell Done, RN, BSN;Other    Virtual Visit No    Medication changes reported     No    Fall or balance concerns reported    No    Tobacco Cessation No Change    Warm-up and Cool-down Performed as group-led instruction    Resistance Training Performed Yes    VAD Patient? No    PAD/SET Patient? No      Pain Assessment   Currently in Pain? No/denies    Pain Score 0-No pain    Multiple Pain Sites No             Capillary Blood Glucose: No results found for this or any previous visit (from the past 24 hour(s)).    Social History   Tobacco Use  Smoking Status Former   Packs/day: 2.00   Years: 27.00   Total pack years: 54.00   Types: Cigarettes   Start date: 02/01/1971   Quit date: 01/31/1998   Years since quitting: 24.6  Smokeless Tobacco Current   Types: Chew    Goals Met:  Proper associated with RPD/PD & O2 Sat Independence with exercise equipment Using PLB without cueing & demonstrates good technique Exercise tolerated well Queuing for purse lip breathing No report of concerns or symptoms today Strength training completed today  Goals Unmet:  Not Applicable  Comments: check out at 11:45   Dr. Kathie Dike is Medical Director for [facility].

## 2022-09-05 NOTE — Progress Notes (Signed)
Pt notified that office will be closed on Tuesday October 17.

## 2022-09-06 ENCOUNTER — Other Ambulatory Visit: Payer: Self-pay | Admitting: Internal Medicine

## 2022-09-06 DIAGNOSIS — E785 Hyperlipidemia, unspecified: Secondary | ICD-10-CM

## 2022-09-09 ENCOUNTER — Encounter (HOSPITAL_COMMUNITY): Payer: Medicare Other

## 2022-09-10 NOTE — Progress Notes (Unsigned)
Cardiology Office Note  Date: 09/11/2022   ID: JAAMAL FAROOQUI, DOB [birth year], MRN [id number]  PCP:  Lindell Spar, MD  Cardiologist:  Rozann Lesches, MD Electrophysiologist:  None   Chief Complaint  Patient presents with   Cardiac follow-up    History of Present Illness: Dennis [name] is a 71 y.o. male last seen in April 2022.  He is here today with his wife for a follow-up visit.  He had several issues to discuss today.    He reports progressive leg fatigue and tingling/numbness with activity.  He underwent ABIs in July, 0.67 on the right and 0.69 on the left.  He was seen by Dr. Donnetta Hutching in September for discussion of PAD and at that time elected to follow things conservatively unless symptoms worsen.  Possibility of angiography was discussed.  He also describes exertional fatigue, intermittent chest pains at times, NYHA class II-III dyspnea.  On the other hand, he states that he has been tolerating pulmonary rehab fairly well in terms of activity level.  His last ischemic testing was in 2021 at which point Myoview was low risk.  He has not had a recent echocardiogram.  No interval palpitations, I personally reviewed his ECG which shows sinus rhythm with diffuse nonspecific ST-T abnormalities.  We went over his medications.  He did switch to Praluent with history of statin myalgias, was seen by Dr. Debara Pickett in the lipid clinic.  Sounds like he tolerated the first 3 months, but then in the second 3 months describes an intense rash and pain in his legs, possible allergic reaction, came off of Praluent and decided to go back on Vytorin which he is not tolerating.   Past Medical History:  Diagnosis Date   Atrial flutter ([place])    Diagnosed by ECG August 2015   Colitis    COPD (chronic obstructive pulmonary disease) ([place])    Coronary atherosclerosis of native coronary artery    Multivessel status post CABG   Essential hypertension    Hyperlipidemia    Insomnia     Past  Surgical History:  Procedure Laterality Date   COLONOSCOPY  01/29/2005   Dr. Gala Romney; rectal polyp s/p polypectomy, otherwise normal.  Pathology with tubulovillous adenoma.   COLONOSCOPY  02/06/2008   Dr. Gala Romney; minimal internal hemorrhoids, diminutive polyp in the splenic flexure s/p cold biopsy removal, otherwise normal.  Pathology with benign polypoid colonic mucosa.   COLONOSCOPY WITH PROPOFOL N/A 08/30/2015   Procedure: COLONOSCOPY WITH PROPOFOL at cecum at 0814; withdrawal time=8 minutes;  Surgeon: Daneil Dolin, MD; internal hemorrhoids-likely source of hematochezia, otherwise normal exam.  Repeat in 5 years.   COLONOSCOPY WITH PROPOFOL N/A 01/28/2021   Procedure: COLONOSCOPY WITH PROPOFOL;  Surgeon: Daneil Dolin, MD;  [facility];  Service: Endoscopy;  Laterality: N/A;  am appt   CORONARY ARTERY BYPASS GRAFT  2005   Dr. Lucianne Lei Trigt: LIMA to LAD, right radial to circumflex, SVG to RCA   CORONARY ARTERY BYPASS GRAFT  10/16/2004   ELECTROPHYSIOLOGIC STUDY N/A 11/21/2016   Procedure: A-Flutter Ablation;  Surgeon: Evans Lance, MD;  Location: [facility];  Service: Cardiovascular;  Laterality: N/A;   TEE WITHOUT CARDIOVERSION N/A 11/21/2016   Procedure: TRANSESOPHAGEAL ECHOCARDIOGRAM (TEE);  Surgeon: Sanda Klein, MD;  Location: [facility] ENDOSCOPY;  Service: Cardiovascular;  Laterality: N/A;    Current Outpatient Medications  Medication Sig Dispense Refill   albuterol (PROVENTIL) (2.5 MG/3ML) 0.083% nebulizer solution Take 3 mLs (2.5 mg total)  by nebulization every 6 (six) hours as needed for wheezing or shortness of breath. 150 mL 3   albuterol (VENTOLIN HFA) 108 (90 Base) MCG/ACT inhaler Inhale 2 puffs into the lungs every 4 (four) hours as needed for wheezing. 18 g 5   ALPRAZolam (XANAX) 0.5 MG tablet TAKE 1 TABLET BY MOUTH 2 TIMES DAILY AS NEEDED FOR ANXIETY. 30 tablet 3   aspirin EC 81 MG tablet Take 1 tablet (81 mg total) by mouth daily. (Patient taking differently:  Take 81 mg by mouth 2 (two) times a week.) 30 tablet 11   budesonide-formoterol (SYMBICORT) 160-4.5 MCG/ACT inhaler TAKE 2 PUFFS FIRST THING IN MORNING AND THEN ANOTHER 2 PUFFS ABOUT 12 HOURS LATER. 30.6 each 1   ezetimibe-simvastatin (VYTORIN) 10-40 MG tablet TAKE 1 TABLET BY MOUTH AT BEDTIME. TAKE 1 TABLET BY MOUTH EVERYDAY AT BEDTIME 90 tablet 1   fenofibrate 160 MG tablet TAKE 1 TABLET BY MOUTH EVERY DAY 90 tablet 1   fish oil-omega-3 fatty acids 1000 MG capsule Take 1 g by mouth in the morning and at bedtime.     guaiFENesin (MUCINEX) 600 MG 12 hr tablet Take 1 tablet (600 mg total) by mouth 2 (two) times daily. 20 tablet 0   losartan-hydrochlorothiazide (HYZAAR) 100-25 MG tablet TAKE 1 TABLET BY MOUTH EVERY DAY 90 tablet 0   metoprolol tartrate (LOPRESSOR) 50 MG tablet TAKE 1 TABLET BY MOUTH TWICE A DAY 180 tablet 1   mupirocin ointment (BACTROBAN) 2 % Apply 1 application topically 2 (two) times daily. (Patient taking differently: Apply 1 application  topically 2 (two) times daily as needed (wound care).) 22 g 0   nitroGLYCERIN (NITROSTAT) 0.4 MG SL tablet PLACE 1 TABLET (0.4 MG TOTAL) UNDER THE TONGUE EVERY 5 (FIVE) MINUTES AS NEEDED. 25 tablet 3   Respiratory Therapy Supplies (NEBULIZER/TUBING/MOUTHPIECE) KIT Nebulizer tubing and mouthpiece 1 kit 0   tamsulosin (FLOMAX) 0.4 MG CAPS capsule Take 1 capsule (0.4 mg total) by mouth daily. 90 capsule 3   Vitamin D, Ergocalciferol, (DRISDOL) 1.25 MG (50000 UNIT) CAPS capsule Take 1 capsule (50,000 Units total) by mouth every 7 (seven) days. 12 capsule 1   zolpidem (AMBIEN) 10 MG tablet Take 1 tablet (10 mg total) by mouth at bedtime as needed for sleep. 30 tablet 3   No current facility-administered medications for this visit.   Allergies:  Ace inhibitors, Enalapril, Dexamethasone, Alirocumab, Clotrimazole-betamethasone, and Prednisone   Social History: The patient  reports that he quit smoking about 24 years ago. His smoking use included  cigarettes. He started smoking about 51 years ago. He has a 54.00 pack-year smoking history. His smokeless tobacco use includes chew. He reports current alcohol use. He reports that he does not use drugs.   Family History: The patient's family history includes Alcohol abuse in his father and mother; Hypertension in his mother.   ROS: No syncope.  Lack of energy.  Physical Exam: VS:  BP (!) 154/90   Pulse 63   Ht _0  (1.854 m)   Wt 218 lb 6.4 oz (99.1 kg)   SpO2 95%   BMI 28.81 kg/m , BMI Body mass index is 28.81 kg/m.  Wt Readings from Last 3 Encounters:  09/11/22 218 lb 6.4 oz (99.1 kg)  09/02/22 220 lb 3.8 oz (99.9 kg)  08/06/22 215 lb (97.5 kg)    General: Patient appears comfortable at rest. HEENT: Conjunctiva and lids normal. Neck: Supple, no elevated JVP or carotid bruits. Lungs: Clear to auscultation, nonlabored breathing  at rest. Cardiac: Regular rate and rhythm, no S3 or significant systolic murmur, no pericardial rub. Abdomen: Soft, nontender, bowel sounds present. Extremities: No pitting edema, distal pulses 1+.  No ulcerations. Skin: Warm and dry. Musculoskeletal: No kyphosis. Neuropsychiatric: Alert and oriented x3, affect grossly appropriate.  ECG:  An ECG dated 02/26/2021 was personally reviewed today and demonstrated:  Sinus rhythm with chronic inferolateral ST segment abnormalities.  Recent Labwork: 02/26/2022: ALT 17; AST 27; Hemoglobin 13.9; Platelets 150 07/08/2022: BUN 20; Creatinine, Ser 1.38; Potassium 5.0; Sodium 139     Component Value Date/Time   CHOL 238 (H) 07/08/2022 1007   TRIG 183 (H) 07/08/2022 1007   HDL 43 07/08/2022 1007   CHOLHDL 5.5 (H) 07/08/2022 1007   CHOLHDL 9.4 (H) 01/11/2018 0833   VLDL 35 08/25/2014 0922   LDLCALC 161 (H) 07/08/2022 1007   LDLCALC  01/11/2018 0833     Comment:     . LDL cholesterol not calculated. Triglyceride levels greater than 400 mg/dL invalidate calculated LDL results. . Reference range:  <100 . Desirable range <100 mg/dL for primary prevention;   <70 mg/dL for patients with CHD or diabetic patients  with > or = 2 CHD risk factors. Marland Kitchen LDL-C is now calculated using the Martin-Hopkins  calculation, which is a validated novel method providing  better accuracy than the Friedewald equation in the  estimation of LDL-C.  Cresenciano Genre et al. Annamaria Helling. 1916;606(00): 2061-2068  (http://education.[employer].com/faq/FAQ164)     Other Studies Reviewed Today:  Carotid Dopplers 03/08/2020: IMPRESSION: Color duplex indicates moderate heterogeneous and calcified plaque, with no hemodynamically significant stenosis by duplex criteria in the extracranial cerebrovascular circulation.  Lexiscan Myoview 03/08/2020: No diagnostic ST segment changes over baseline to indicate ischemia. Small size, mild intensity, and predominantly apical inferior defect that is most consistent with soft tissue attenuation rather than scar in light of normal wall motion. No large ischemic territories noted. This is a low risk study. Nuclear stress EF: 55%.  Assessment and Plan:  1.  Exertional fatigue and shortness of breath with intermittent chest pain.  Patient has known history of multivessel CAD status post CABG in 2005.  ECG chronically abnormal.  Last ischemic work-up was in 2021.  Plan to proceed with follow-up echocardiogram as well as Lexiscan Myoview for repeat cardiac structural and ischemic evaluation.  For now would continue aspirin, Hyzaar, Lopressor, and as needed nitroglycerin.  2.  Mixed hyperlipidemia with statin myalgias.  Has what sounds like an allergic reaction to Praluent as discussed above.  He has not had interval follow-up in the lipid clinic and a virtual encounter will be arranged to discuss other options.  He put himself back on Vytorin but is still having problems with this in terms of tolerance.  LDL also far from goal, 161 in August.  3.  PAD with claudication, no ulcerations or  limb threatening ischemia.  I reviewed Dr. Luther Parody note.  May need to consider angiography and he does have follow-up pending.  Medication Adjustments/Labs and Tests Ordered: Current medicines are reviewed at length with the patient today.  Concerns regarding medicines are outlined above.   Tests Ordered: Orders Placed This Encounter  Procedures   NM Myocar Multi W/Spect W/Wall Motion / EF   AMB Referral to [facility]   EKG 12-Lead   ECHOCARDIOGRAM COMPLETE    Medication Changes: No orders of the defined types were placed in this encounter.   Disposition:  Follow up  test results.  Signed, Satira Sark,  MD, [facility] 09/11/2022 10:19 AM    [facility] [place]. [address], [place],  [address] Phone: [phone]; Fax: [phone]

## 2022-09-11 ENCOUNTER — Ambulatory Visit: Payer: Medicare Other | Attending: Cardiology | Admitting: Cardiology

## 2022-09-11 ENCOUNTER — Encounter (HOSPITAL_COMMUNITY): Payer: Medicare Other

## 2022-09-11 ENCOUNTER — Encounter: Payer: Self-pay | Admitting: Cardiology

## 2022-09-11 VITALS — BP 154/90 | HR 63 | Ht 73.0 in | Wt 218.4 lb

## 2022-09-11 DIAGNOSIS — M791 Myalgia, unspecified site: Secondary | ICD-10-CM | POA: Diagnosis not present

## 2022-09-11 DIAGNOSIS — I25119 Atherosclerotic heart disease of native coronary artery with unspecified angina pectoris: Secondary | ICD-10-CM | POA: Diagnosis not present

## 2022-09-11 DIAGNOSIS — E782 Mixed hyperlipidemia: Secondary | ICD-10-CM | POA: Diagnosis not present

## 2022-09-11 DIAGNOSIS — I2510 Atherosclerotic heart disease of native coronary artery without angina pectoris: Secondary | ICD-10-CM

## 2022-09-11 DIAGNOSIS — T466X5A Adverse effect of antihyperlipidemic and antiarteriosclerotic drugs, initial encounter: Secondary | ICD-10-CM | POA: Insufficient documentation

## 2022-09-11 DIAGNOSIS — I739 Peripheral vascular disease, unspecified: Secondary | ICD-10-CM | POA: Diagnosis not present

## 2022-09-11 NOTE — Patient Instructions (Signed)
Medication Instructions:  Your physician recommends that you continue on your current medications as directed. Please refer to the Current Medication list given to you today.   Labwork: NONE TODAY  Testing/Procedures: Your physician has requested that you have an echocardiogram. Echocardiography is a painless test that uses sound waves to create images of your heart. It provides your doctor with information about the size and shape of your heart and how well your heart's chambers and valves are working. This procedure takes approximately one hour. There are no restrictions for this procedure. Please do NOT wear cologne, perfume, aftershave, or lotions (deodorant is allowed). Please arrive 15 minutes prior to your appointment time.  Your physician has requested that you have a lexiscan myoview. For further information please visit [email or web address]. Please follow instruction sheet, as given.   Follow-Up: TO BE DETERMINED AFTER TESTING   Any Other Special Instructions Will Be Listed Below (If Applicable).    You have been referred to Lipid Clinic.They will call you to schedule appoitment.   If you need a refill on your cardiac medications before your next appointment, please call your pharmacy.

## 2022-09-15 ENCOUNTER — Telehealth: Payer: Self-pay | Admitting: Cardiology

## 2022-09-15 NOTE — Telephone Encounter (Signed)
Pt c/o medication issue:  1. Name of Medication: metoprolol tartrate (LOPRESSOR) 50 MG tablet  2. How are you currently taking this medication (dosage and times per day)? As prescribed  3. Are you having a reaction (difficulty breathing--STAT)?   4. What is your medication issue? Pt is requesting call back in regards to this medication. He would like to know if he should continue taking this medication with the upcoming tests he is to take that was order by Dr. Domenic Polite.

## 2022-09-15 NOTE — Telephone Encounter (Signed)
Patient is having testing done ordered by provider: -Lexiscan -Echocardiogram  Patient questioning if he needs to hold Lopressor 50 mg tablets prior to testing.   Patient advised he can take medication as normal. Patient verbalized understanding and had no further questions or concerns at this time.

## 2022-09-16 ENCOUNTER — Encounter (HOSPITAL_COMMUNITY)
Admission: RE | Admit: 2022-09-16 | Discharge: 2022-09-16 | Disposition: A | Discharge status: Home / Self Care | Payer: Medicare Other | Source: Ambulatory Visit | Attending: Internal Medicine | Admitting: Internal Medicine

## 2022-09-16 VITALS — Wt 215.6 lb

## 2022-09-16 DIAGNOSIS — J449 Chronic obstructive pulmonary disease, unspecified: Secondary | ICD-10-CM

## 2022-09-16 NOTE — Progress Notes (Signed)
Daily Session Note  Patient Details  Name: Dennis [name] MRN: [id number] Date of Birth: May 07, 1951 Referring Provider:   Flowsheet Row PULMONARY REHAB COPD ORIENTATION from 07/01/2022 in [place]  Referring Provider Dr. Melvyn Novas       Encounter Date: 09/16/2022  Check In:  Session Check In - 09/16/22 1045       Check-In   Supervising physician immediately available to respond to emergencies [facility] MD immediately available    Physician(s) Dr Harl Bowie    Location [facility]    Staff Present Redge Gainer, BS, Exercise Physiologist;Adiana Smelcer Hassell Done, RN, BSN    Virtual Visit No    Medication changes reported     No    Fall or balance concerns reported    No    Tobacco Cessation No Change    Warm-up and Cool-down Performed as group-led instruction    Resistance Training Performed Yes    VAD Patient? No    PAD/SET Patient? No      Pain Assessment   Currently in Pain? No/denies    Pain Score 0-No pain    Multiple Pain Sites No             Capillary Blood Glucose: No results found for this or any previous visit (from the past 24 hour(s)).    Social History   Tobacco Use  Smoking Status Former   Packs/day: 2.00   Years: 27.00   Total pack years: 54.00   Types: Cigarettes   Start date: 02/01/1971   Quit date: 01/31/1998   Years since quitting: 24.6  Smokeless Tobacco Current   Types: Chew    Goals Met:  Proper associated with RPD/PD & O2 Sat Independence with exercise equipment Using PLB without cueing & demonstrates good technique Exercise tolerated well Queuing for purse lip breathing No report of concerns or symptoms today Strength training completed today  Goals Unmet:  Not Applicable  Comments: checkout at 1145.   Dr. Kathie Dike is Medical Director for [facility] Pulmonary Rehab.

## 2022-09-18 ENCOUNTER — Encounter (HOSPITAL_COMMUNITY)
Admission: RE | Admit: 2022-09-18 | Discharge: 2022-09-18 | Disposition: A | Discharge status: Home / Self Care | Payer: Medicare Other | Source: Ambulatory Visit | Attending: Internal Medicine | Admitting: Internal Medicine

## 2022-09-18 DIAGNOSIS — J449 Chronic obstructive pulmonary disease, unspecified: Secondary | ICD-10-CM | POA: Diagnosis not present

## 2022-09-18 NOTE — Progress Notes (Signed)
Daily Session Note  Patient Details  Name: Dennis [name] MRN: [id number] Date of Birth: 1951/11/24 Referring Provider:   Flowsheet Row PULMONARY REHAB COPD ORIENTATION from 07/01/2022 in [place]  Referring Provider Dr. Melvyn Novas       Encounter Date: 09/18/2022  Check In:  Session Check In - 09/18/22 1045       Check-In   Supervising physician immediately available to respond to emergencies [facility] MD immediately available    Physician(s) Dr Harl Bowie    Location [facility]    Staff Present Leana Roe, BS, Exercise Physiologist;Hillary Troutman BSN, RN;Zaleah Ternes Wynetta Emery, RN, BSN    Virtual Visit No    Medication changes reported     No    Fall or balance concerns reported    No    Tobacco Cessation No Change    Warm-up and Cool-down Performed as group-led instruction    Resistance Training Performed Yes    VAD Patient? No    PAD/SET Patient? No      Pain Assessment   Currently in Pain? No/denies    Pain Score 0-No pain    Multiple Pain Sites No             Capillary Blood Glucose: No results found for this or any previous visit (from the past 24 hour(s)).    Social History   Tobacco Use  Smoking Status Former   Packs/day: 2.00   Years: 27.00   Total pack years: 54.00   Types: Cigarettes   Start date: 02/01/1971   Quit date: 01/31/1998   Years since quitting: 24.6  Smokeless Tobacco Current   Types: Chew    Goals Met:  Proper associated with RPD/PD & O2 Sat Independence with exercise equipment Using PLB without cueing & demonstrates good technique Exercise tolerated well No report of concerns or symptoms today Strength training completed today  Goals Unmet:  Not Applicable  Comments: Check out 1145.   Dr. Carlyle Dolly is Medical Director for [facility] Cardiac Rehab

## 2022-09-19 ENCOUNTER — Encounter (HOSPITAL_COMMUNITY): Payer: Self-pay

## 2022-09-19 ENCOUNTER — Ambulatory Visit (HOSPITAL_COMMUNITY)
Admission: RE | Admit: 2022-09-19 | Discharge: 2022-09-19 | Disposition: A | Discharge status: Home / Self Care | Payer: Medicare Other | Source: Ambulatory Visit | Attending: Cardiology | Admitting: Cardiology

## 2022-09-19 ENCOUNTER — Telehealth: Payer: Self-pay

## 2022-09-19 ENCOUNTER — Ambulatory Visit (HOSPITAL_BASED_OUTPATIENT_CLINIC_OR_DEPARTMENT_OTHER)
Admission: RE | Admit: 2022-09-19 | Discharge: 2022-09-19 | Disposition: A | Discharge status: Home / Self Care | Payer: Medicare Other | Source: Ambulatory Visit | Attending: Cardiology | Admitting: Cardiology

## 2022-09-19 DIAGNOSIS — I25119 Atherosclerotic heart disease of native coronary artery with unspecified angina pectoris: Secondary | ICD-10-CM | POA: Diagnosis not present

## 2022-09-19 LAB — NM MYOCAR MULTI W/SPECT W/WALL MOTION / EF
LV dias vol: 141 mL (ref 62–150)
LV sys vol: 60 mL
Nuc Stress EF: 57 %
Peak HR: 73 {beats}/min
RATE: 0.4
Rest HR: 56 {beats}/min
Rest Nuclear Isotope Dose: 11 mCi
SDS: 3
SRS: 2
SSS: 5
Stress Nuclear Isotope Dose: 33 mCi
TID: 1.17

## 2022-09-19 LAB — ECHOCARDIOGRAM COMPLETE
AR max vel: 2.17 cm2
AV Area VTI: 2.3 cm2
AV Area mean vel: 2.22 cm2
AV Mean grad: 4.7 mmHg
AV Peak grad: 10.3 mmHg
Ao pk vel: 1.61 m/s
Area-P 1/2: 3.31 cm2
S' Lateral: 3.4 cm

## 2022-09-19 MED ORDER — TECHNETIUM TC 99M TETROFOSMIN IV KIT
10.0000 | PACK | Freq: Once | INTRAVENOUS | Status: AC | PRN
  Administered 2022-09-19: 11 via INTRAVENOUS

## 2022-09-19 MED ORDER — TECHNETIUM TC 99M TETROFOSMIN IV KIT
30.0000 | PACK | Freq: Once | INTRAVENOUS | Status: AC | PRN
  Administered 2022-09-19: 33 via INTRAVENOUS

## 2022-09-19 MED ORDER — REGADENOSON 0.4 MG/5ML IV SOLN
INTRAVENOUS | Status: AC
  Administered 2022-09-19: 0.4 mg via INTRAVENOUS
  Filled 2022-09-19: qty 5

## 2022-09-19 MED ORDER — ISOSORBIDE MONONITRATE ER 30 MG PO TB24: 15.0000 mg | ORAL_TABLET | Freq: Every day | ORAL | 3 refills | Status: DC

## 2022-09-19 MED ORDER — SODIUM CHLORIDE FLUSH 0.9 % IV SOLN
INTRAVENOUS | Status: AC
  Administered 2022-09-19: 10 mL via INTRAVENOUS
  Filled 2022-09-19: qty 10

## 2022-09-19 NOTE — Telephone Encounter (Addendum)
-----   Message from Satira Sark, MD sent at 09/19/2022  1:01 PM EDT ----- Results reviewed.  I also reviewed the images.  Mild inferior wall perfusion defect that could represent a mild ischemic territory with overall normal LVEF at 57%.  Relatively low risk study from a cardiac perspective.  This would not necessarily push [place] to a heart catheterization unless his symptoms persist with medical therapy.  Could consider adding Imdur starting at 15 mg once in the evening and uptitrate to 30 mg in the evening.  Please arrange follow-up in the next 4 to 6 weeks for further discussion.    Results reviewed.  Follow-up echocardiogram reveals normal LVEF at 55 to 09%, normal diastolic parameters and RV contraction.  Aortic valve mildly calcified but not stenotic.  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  Follow-up on stress testing.

## 2022-09-19 NOTE — Telephone Encounter (Addendum)
Left message to return call   Follow up appointment made in the [place] office for 11/04/22 at 3 pm with E.Peck,NP    Results discussed with patient and he agrees to start Imdur 15 mg daily.

## 2022-09-19 NOTE — Progress Notes (Signed)
*  PRELIMINARY RESULTS* Echocardiogram 2D Echocardiogram has been performed.  Samuel Germany 09/19/2022, 11:27 AM

## 2022-09-19 NOTE — Progress Notes (Signed)
I.V. discontinued at 1040. Site was clean, dry, and intact.   Alvino Chapel, RCS

## 2022-09-22 ENCOUNTER — Telehealth: Payer: Self-pay

## 2022-09-22 NOTE — Telephone Encounter (Signed)
Pt called requesting an appt with Dr Donnetta Hutching.  Reviewed pt's chart, returned call for clarification, two identifiers used. Pt stated that he needed the appt to discuss his leg issues related to the new information he'd received about his heart and new medications his cardiologist wants to prescribe. Phone visit offered, but declined. Pt wants to visit face-to-face. Appt scheduled. Confirmed understanding.

## 2022-09-23 ENCOUNTER — Encounter (HOSPITAL_COMMUNITY)
Admission: RE | Admit: 2022-09-23 | Discharge: 2022-09-23 | Disposition: A | Discharge status: Home / Self Care | Payer: Medicare Other | Source: Ambulatory Visit | Attending: Internal Medicine | Admitting: Internal Medicine

## 2022-09-23 DIAGNOSIS — J449 Chronic obstructive pulmonary disease, unspecified: Secondary | ICD-10-CM

## 2022-09-23 NOTE — Progress Notes (Signed)
Daily Session Note  Patient Details  Name: Dennis [name] MRN: [id number] Date of Birth: Dec 16, 1950 Referring Provider:   Flowsheet Row PULMONARY REHAB COPD ORIENTATION from 07/01/2022 in [place]  Referring Provider Dr. Melvyn Novas       Encounter Date: 09/23/2022  Check In:  Session Check In - 09/23/22 1045       Check-In   Supervising physician immediately available to respond to emergencies [facility] MD immediately available    Physician(s) Dr Domenic Polite    Location [facility]    Staff Present Leana Roe, BS, Exercise Physiologist;Virginio Isidore Hassell Done, RN, BSN    Virtual Visit No    Medication changes reported     No    Fall or balance concerns reported    No    Tobacco Cessation No Change    Warm-up and Cool-down Performed as group-led instruction    Resistance Training Performed Yes    VAD Patient? No    PAD/SET Patient? No      Pain Assessment   Currently in Pain? No/denies    Pain Score 0-No pain    Multiple Pain Sites No             Capillary Blood Glucose: No results found for this or any previous visit (from the past 24 hour(s)).    Social History   Tobacco Use  Smoking Status Former   Packs/day: 2.00   Years: 27.00   Total pack years: 54.00   Types: Cigarettes   Start date: 02/01/1971   Quit date: 01/31/1998   Years since quitting: 24.6  Smokeless Tobacco Current   Types: Chew    Goals Met:  Proper associated with RPD/PD & O2 Sat Independence with exercise equipment Using PLB without cueing & demonstrates good technique Exercise tolerated well Queuing for purse lip breathing No report of concerns or symptoms today Strength training completed today  Goals Unmet:  Not Applicable  Comments: Checkout at 1145.   Dr. Kathie Dike is Medical Director for [facility] Pulmonary Rehab.

## 2022-09-25 ENCOUNTER — Encounter (HOSPITAL_COMMUNITY)
Admission: RE | Admit: 2022-09-25 | Discharge: 2022-09-25 | Disposition: A | Discharge status: Home / Self Care | Payer: Medicare Other | Source: Ambulatory Visit | Attending: Internal Medicine | Admitting: Internal Medicine

## 2022-09-25 DIAGNOSIS — J449 Chronic obstructive pulmonary disease, unspecified: Secondary | ICD-10-CM | POA: Diagnosis not present

## 2022-09-25 NOTE — Progress Notes (Signed)
Daily Session Note  Patient Details  Name: Dennis [name] MRN: [id number] Date of Birth: 04-12-1951 Referring Provider:   Flowsheet Row PULMONARY REHAB COPD ORIENTATION from 07/01/2022 in [place]  Referring Provider Dr. Melvyn Novas       Encounter Date: 09/25/2022  Check In:  Session Check In - 09/25/22 1045       Check-In   Supervising physician immediately available to respond to emergencies [facility] MD immediately available    Physician(s) Dr. Domenic Polite    Location [facility]    Staff Present Leana Roe, BS, Exercise Physiologist;Neriah Brott BSN, RN;Dalton Sherrie George, MS, ACSM-CEP    Virtual Visit No    Medication changes reported     No    Fall or balance concerns reported    No    Tobacco Cessation No Change    Warm-up and Cool-down Performed as group-led instruction    Resistance Training Performed Yes    VAD Patient? No    PAD/SET Patient? No      Pain Assessment   Currently in Pain? No/denies    Pain Score 0-No pain    Multiple Pain Sites No             Capillary Blood Glucose: No results found for this or any previous visit (from the past 24 hour(s)).    Social History   Tobacco Use  Smoking Status Former   Packs/day: 2.00   Years: 27.00   Total pack years: 54.00   Types: Cigarettes   Start date: 02/01/1971   Quit date: 01/31/1998   Years since quitting: 24.6  Smokeless Tobacco Current   Types: Chew    Goals Met:  Proper associated with RPD/PD & O2 Sat Independence with exercise equipment Using PLB without cueing & demonstrates good technique Exercise tolerated well Queuing for purse lip breathing No report of concerns or symptoms today Strength training completed today  Goals Unmet:  Not Applicable  Comments: checkout at 11:45   Dr. Kathie Dike is Medical Director for [facility] Pulmonary Rehab.

## 2022-09-30 ENCOUNTER — Encounter (HOSPITAL_COMMUNITY): Payer: Medicare Other

## 2022-10-01 ENCOUNTER — Ambulatory Visit: Payer: Medicare Other | Admitting: Vascular Surgery

## 2022-10-01 NOTE — Progress Notes (Signed)
Pulmonary Individual Treatment Plan  Patient Details  Name: Dennis [name] MRN: [id number] Date of Birth: 03-20-1951 Referring Provider:   Flowsheet Row PULMONARY REHAB COPD ORIENTATION from 07/01/2022 in [place]  Referring Provider Dr. Melvyn Novas       Initial Encounter Date:  Flowsheet Row PULMONARY REHAB COPD ORIENTATION from 07/01/2022 in [place]  Date 07/01/22       Visit Diagnosis: COPD mixed type ([place])  Patient's Home Medications on Admission:   Current Outpatient Medications:    albuterol (PROVENTIL) (2.5 MG/3ML) 0.083% nebulizer solution, Take 3 mLs (2.5 mg total) by nebulization every 6 (six) hours as needed for wheezing or shortness of breath., Disp: 150 mL, Rfl: 3   albuterol (VENTOLIN HFA) 108 (90 Base) MCG/ACT inhaler, Inhale 2 puffs into the lungs every 4 (four) hours as needed for wheezing., Disp: 18 g, Rfl: 5   ALPRAZolam (XANAX) 0.5 MG tablet, TAKE 1 TABLET BY MOUTH 2 TIMES DAILY AS NEEDED FOR ANXIETY., Disp: 30 tablet, Rfl: 3   aspirin EC 81 MG tablet, Take 1 tablet (81 mg total) by mouth daily. (Patient taking differently: Take 81 mg by mouth 2 (two) times a week.), Disp: 30 tablet, Rfl: 11   budesonide-formoterol (SYMBICORT) 160-4.5 MCG/ACT inhaler, TAKE 2 PUFFS FIRST THING IN MORNING AND THEN ANOTHER 2 PUFFS ABOUT 12 HOURS LATER., Disp: 30.6 each, Rfl: 1   ezetimibe-simvastatin (VYTORIN) 10-40 MG tablet, TAKE 1 TABLET BY MOUTH AT BEDTIME. TAKE 1 TABLET BY MOUTH EVERYDAY AT BEDTIME, Disp: 90 tablet, Rfl: 1   fenofibrate 160 MG tablet, TAKE 1 TABLET BY MOUTH EVERY DAY, Disp: 90 tablet, Rfl: 1   fish oil-omega-3 fatty acids 1000 MG capsule, Take 1 g by mouth in the morning and at bedtime., Disp: , Rfl:    guaiFENesin (MUCINEX) 600 MG 12 hr tablet, Take 1 tablet (600 mg total) by mouth 2 (two) times daily., Disp: 20 tablet, Rfl: 0   isosorbide mononitrate (IMDUR) 30 MG 24 hr tablet, Take 0.5 tablets (15 mg total) by mouth at  bedtime., Disp: 45 tablet, Rfl: 3   losartan-hydrochlorothiazide (HYZAAR) 100-25 MG tablet, TAKE 1 TABLET BY MOUTH EVERY DAY, Disp: 90 tablet, Rfl: 0   metoprolol tartrate (LOPRESSOR) 50 MG tablet, TAKE 1 TABLET BY MOUTH TWICE A DAY, Disp: 180 tablet, Rfl: 1   mupirocin ointment (BACTROBAN) 2 %, Apply 1 application topically 2 (two) times daily. (Patient taking differently: Apply 1 application  topically 2 (two) times daily as needed (wound care).), Disp: 22 g, Rfl: 0   nitroGLYCERIN (NITROSTAT) 0.4 MG SL tablet, PLACE 1 TABLET (0.4 MG TOTAL) UNDER THE TONGUE EVERY 5 (FIVE) MINUTES AS NEEDED., Disp: 25 tablet, Rfl: 3   Respiratory Therapy Supplies (NEBULIZER/TUBING/MOUTHPIECE) KIT, Nebulizer tubing and mouthpiece, Disp: 1 kit, Rfl: 0   tamsulosin (FLOMAX) 0.4 MG CAPS capsule, Take 1 capsule (0.4 mg total) by mouth daily., Disp: 90 capsule, Rfl: 3   Vitamin D, Ergocalciferol, (DRISDOL) 1.25 MG (50000 UNIT) CAPS capsule, Take 1 capsule (50,000 Units total) by mouth every 7 (seven) days., Disp: 12 capsule, Rfl: 1   zolpidem (AMBIEN) 10 MG tablet, Take 1 tablet (10 mg total) by mouth at bedtime as needed for sleep., Disp: 30 tablet, Rfl: 3  Past Medical History: Past Medical History:  Diagnosis Date   Atrial flutter ([place])    Diagnosed by ECG August 2015   Colitis    COPD (chronic obstructive pulmonary disease) (HCC)    Coronary atherosclerosis of native coronary  artery    Multivessel status post CABG   Essential hypertension    Hyperlipidemia    Insomnia     Tobacco Use: Social History   Tobacco Use  Smoking Status Former   Packs/day: 2.00   Years: 27.00   Total pack years: 54.00   Types: Cigarettes   Start date: 02/01/1971   Quit date: 01/31/1998   Years since quitting: 24.6  Smokeless Tobacco Current   Types: Chew    Labs: Review Flowsheet  More data exists      Latest Ref Rng & Units 07/23/2020 12/26/2020 07/03/2021 02/26/2022 07/08/2022  Labs for ITP Cardiac and Pulmonary Rehab   Cholestrol 100 - 199 mg/dL 299  280  232  - 238   LDL (calc) 0 - 99 mg/dL 192  193  162  - 161   HDL-C >39 mg/dL 43  42  43  - 43   Trlycerides 0 - 149 mg/dL 323  234  146  - 183   Hemoglobin A1c 4.8 - 5.6 % - 5.7  6.0  5.9  -    Capillary Blood Glucose: Lab Results  Component Value Date   GLUCAP 177 (H) 03/26/2020     Pulmonary Assessment Scores:  Pulmonary Assessment Scores     Row Name 07/01/22 0852         ADL UCSD   SOB Score total 40     Rest 1     Walk 2     Stairs 5     Bath 1     Dress 1     Shop 1       CAT Score   CAT Score 24       mMRC Score   mMRC Score 3             UCSD: Self-administered rating of dyspnea associated with activities of daily living (ADLs) 6-point scale (0 = "not at all" to 5 = "maximal or unable to do because of breathlessness")  Scoring Scores range from 0 to 120.  Minimally important difference is 5 units  CAT: CAT can identify the health impairment of COPD patients and is better correlated with disease progression.  CAT has a scoring range of zero to 40. The CAT score is classified into four groups of low (less than 10), medium (10 - 20), high (21-30) and very high (31-40) based on the impact level of disease on health status. A CAT score over 10 suggests significant symptoms.  A worsening CAT score could be explained by an exacerbation, poor medication adherence, poor inhaler technique, or progression of COPD or comorbid conditions.  CAT MCID is 2 points  mMRC: mMRC (Modified Medical Research Council) Dyspnea Scale is used to assess the degree of baseline functional disability in patients of respiratory disease due to dyspnea. No minimal important difference is established. A decrease in score of 1 point or greater is considered a positive change.   Pulmonary Function Assessment:   Exercise Target Goals: Exercise Program Goal: Individual exercise prescription set using results from initial 6 min walk test and THRR  while considering  patient's activity barriers and safety.   Exercise Prescription Goal: Initial exercise prescription builds to 30-45 minutes a day of aerobic activity, 2-3 days per week.  Home exercise guidelines will be given to patient during program as part of exercise prescription that the participant will acknowledge.  Activity Barriers & Risk Stratification:  Activity Barriers & Cardiac Risk Stratification - 07/01/22 9798  Activity Barriers & Cardiac Risk Stratification   Activity Barriers Other (comment)    Comments peripheral vascular disease    Cardiac Risk Stratification High             6 Minute Walk:  6 Minute Walk     Row Name 07/01/22 0947         6 Minute Walk   Phase Initial     Distance 1200 feet     Walk Time 6 minutes     # of Rest Breaks 0     MPH 2.27     METS 2.56     RPE 15     Perceived Dyspnea  11     VO2 Peak 8.96     Symptoms No     Resting HR 55 bpm     Resting BP 102/70     Resting Oxygen Saturation  93 %     Exercise Oxygen Saturation  during 6 min walk 93 %     Max Ex. HR 74 bpm     Max Ex. BP 138/70     2 Minute Post BP 120/68       Interval HR   1 Minute HR 62     2 Minute HR 64     3 Minute HR 65     4 Minute HR 70     5 Minute HR 70     6 Minute HR 74     2 Minute Post HR 61     Interval Heart Rate? Yes       Interval Oxygen   Interval Oxygen? Yes     Baseline Oxygen Saturation % 93 %     1 Minute Oxygen Saturation % 93 %     1 Minute Liters of Oxygen 0 L     2 Minute Oxygen Saturation % 93 %     2 Minute Liters of Oxygen 0 L     3 Minute Oxygen Saturation % 93 %     3 Minute Liters of Oxygen 0 L     4 Minute Oxygen Saturation % 93 %     4 Minute Liters of Oxygen 0 L     5 Minute Oxygen Saturation % 93 %     5 Minute Liters of Oxygen 0 L     6 Minute Oxygen Saturation % 93 %     6 Minute Liters of Oxygen 0 L     2 Minute Post Oxygen Saturation % 95 %     2 Minute Post Liters of Oxygen 0 L               Oxygen Initial Assessment:  Oxygen Initial Assessment - 07/01/22 0946       Home Oxygen   Home Oxygen Device None    Sleep Oxygen Prescription None    Home Exercise Oxygen Prescription None    Home Resting Oxygen Prescription None    Compliance with Home Oxygen Use Yes      Initial 6 min Walk   Oxygen Used None      Program Oxygen Prescription   Program Oxygen Prescription None      Intervention   Short Term Goals To learn and understand importance of monitoring SPO2 with pulse oximeter and demonstrate accurate use of the pulse oximeter.;To learn and understand importance of maintaining oxygen saturations>88%;To learn and demonstrate proper pursed lip breathing techniques or other breathing techniques.     Long  Term Goals Verbalizes importance of monitoring SPO2 with pulse oximeter and return demonstration;Maintenance of O2 saturations>88%;Exhibits proper breathing techniques, such as pursed lip breathing or other method taught during program session;Compliance with respiratory medication             Oxygen Re-Evaluation:  Oxygen Re-Evaluation     Row Name 07/08/22 1253 08/05/22 1152 09/02/22 1256 09/30/22 0920       Program Oxygen Prescription   Program Oxygen Prescription None None None None      Home Oxygen   Home Oxygen Device None None None None    Sleep Oxygen Prescription None None None None    Home Exercise Oxygen Prescription None None None None    Home Resting Oxygen Prescription None None None None    Compliance with Home Oxygen Use Yes Yes Yes Yes      Goals/Expected Outcomes   Short Term Goals To learn and understand importance of monitoring SPO2 with pulse oximeter and demonstrate accurate use of the pulse oximeter.;To learn and understand importance of maintaining oxygen saturations>88%;To learn and demonstrate proper pursed lip breathing techniques or other breathing techniques.  To learn and understand importance of monitoring SPO2 with pulse  oximeter and demonstrate accurate use of the pulse oximeter.;To learn and understand importance of maintaining oxygen saturations>88%;To learn and demonstrate proper pursed lip breathing techniques or other breathing techniques.  To learn and understand importance of monitoring SPO2 with pulse oximeter and demonstrate accurate use of the pulse oximeter.;To learn and understand importance of maintaining oxygen saturations>88%;To learn and demonstrate proper pursed lip breathing techniques or other breathing techniques.  To learn and understand importance of monitoring SPO2 with pulse oximeter and demonstrate accurate use of the pulse oximeter.;To learn and understand importance of maintaining oxygen saturations>88%;To learn and demonstrate proper pursed lip breathing techniques or other breathing techniques.     Long  Term Goals Verbalizes importance of monitoring SPO2 with pulse oximeter and return demonstration;Maintenance of O2 saturations>88%;Exhibits proper breathing techniques, such as pursed lip breathing or other method taught during program session;Compliance with respiratory medication Verbalizes importance of monitoring SPO2 with pulse oximeter and return demonstration;Maintenance of O2 saturations>88%;Exhibits proper breathing techniques, such as pursed lip breathing or other method taught during program session;Compliance with respiratory medication Verbalizes importance of monitoring SPO2 with pulse oximeter and return demonstration;Maintenance of O2 saturations>88%;Exhibits proper breathing techniques, such as pursed lip breathing or other method taught during program session;Compliance with respiratory medication Verbalizes importance of monitoring SPO2 with pulse oximeter and return demonstration;Maintenance of O2 saturations>88%;Exhibits proper breathing techniques, such as pursed lip breathing or other method taught during program session;Compliance with respiratory medication    Goals/Expected  Outcomes Compliance Compliance Compliance Compliance             Oxygen Discharge (Final Oxygen Re-Evaluation):  Oxygen Re-Evaluation - 09/30/22 0920       Program Oxygen Prescription   Program Oxygen Prescription None      Home Oxygen   Home Oxygen Device None    Sleep Oxygen Prescription None    Home Exercise Oxygen Prescription None    Home Resting Oxygen Prescription None    Compliance with Home Oxygen Use Yes      Goals/Expected Outcomes   Short Term Goals To learn and understand importance of monitoring SPO2 with pulse oximeter and demonstrate accurate use of the pulse oximeter.;To learn and understand importance of maintaining oxygen saturations>88%;To learn and demonstrate proper pursed lip breathing techniques or other breathing techniques.     Long  Term Goals Verbalizes importance of monitoring SPO2 with pulse oximeter and return demonstration;Maintenance of O2 saturations>88%;Exhibits proper breathing techniques, such as pursed lip breathing or other method taught during program session;Compliance with respiratory medication    Goals/Expected Outcomes Compliance             Initial Exercise Prescription:  Initial Exercise Prescription - 07/01/22 0900       Date of Initial Exercise RX and Referring Provider   Date 07/01/22    Referring Provider Dr. Melvyn Novas    Expected Discharge Date 11/04/22      Recumbant Elliptical   Level 1    RPM 45    Minutes 39      Prescription Details   Frequency (times per week) 2    Duration Progress to 30 minutes of continuous aerobic without signs/symptoms of physical distress      Intensity   THRR 40-80% of Max Heartrate 60-121    Ratings of Perceived Exertion 11-13    Perceived Dyspnea 0-4      Resistance Training   Training Prescription Yes    Weight 4    Reps 10-15             Perform Capillary Blood Glucose checks as needed.  Exercise Prescription Changes:   Exercise Prescription Changes     Row Name  07/08/22 1200 07/22/22 1200 08/05/22 1100 08/21/22 1300 09/02/22 1200     Response to Exercise   Blood Pressure (Admit) 126/70 148/72 138/78 134/74 124/74   Blood Pressure (Exercise) 124/70 126/72 140/78 140/70 130/72   Blood Pressure (Exit) 108/68 110/72 106/64 108/60 120/70   Heart Rate (Admit) 60 bpm 58 bpm 63 bpm 55 bpm 59 bpm   Heart Rate (Exercise) 66 bpm 65 bpm 60 bpm 92 bpm 59 bpm   Heart Rate (Exit) 65 bpm 62 bpm 64 bpm 94 bpm 65 bpm   Oxygen Saturation (Admit) 92 % 93 % 96 % 96 % 91 %   Oxygen Saturation (Exercise) 93 % 93 % 92 % 92 % 92 %   Oxygen Saturation (Exit) 95 % 95 % 96 % 96 % 95 %   Rating of Perceived Exertion (Exercise) _0 Perceived Dyspnea (Exercise) _1 Duration Continue with 30 min of aerobic exercise without signs/symptoms of physical distress. Continue with 30 min of aerobic exercise without signs/symptoms of physical distress. Continue with 30 min of aerobic exercise without signs/symptoms of physical distress. Continue with 30 min of aerobic exercise without signs/symptoms of physical distress. Continue with 30 min of aerobic exercise without signs/symptoms of physical distress.   Intensity _2      Progression   Progression Continue to progress workloads to maintain intensity without signs/symptoms of physical distress. Continue to progress workloads to maintain intensity without signs/symptoms of physical distress. Continue to progress workloads to maintain intensity without signs/symptoms of physical distress. Continue to progress workloads to maintain intensity without signs/symptoms of physical distress. Continue to progress workloads to maintain intensity without signs/symptoms of physical distress.     Resistance Training   Training Prescription _3    Weight _4 Reps 10-15 10-15 10-15 10-15 10-15   Time 10 Minutes 10 Minutes 10 Minutes 10  Minutes 10 Minutes     Recumbant Elliptical   Level _5 RPM 55 45 40 42 32  Minutes 39 39 39 39 39   METs 3 3.1 2.2 2.6 1.7    Row Name 09/16/22 1200 09/25/22 1200           Response to Exercise   Blood Pressure (Admit) 104/64 110/58      Blood Pressure (Exercise) 116/62 120/58      Blood Pressure (Exit) 120/62 108/58      Heart Rate (Admit) 64 bpm 59 bpm      Heart Rate (Exercise) 69 bpm 67 bpm      Heart Rate (Exit) 65 bpm 66 bpm      Oxygen Saturation (Admit) 94 % 96 %      Oxygen Saturation (Exercise) 93 % 93 %      Oxygen Saturation (Exit) 95 % 97 %      Rating of Perceived Exertion (Exercise) 12 13      Perceived Dyspnea (Exercise) 12 13      Duration Continue with 30 min of aerobic exercise without signs/symptoms of physical distress. Continue with 30 min of aerobic exercise without signs/symptoms of physical distress.      Intensity THRR unchanged THRR unchanged        Progression   Progression Continue to progress workloads to maintain intensity without signs/symptoms of physical distress. Continue to progress workloads to maintain intensity without signs/symptoms of physical distress.        Resistance Training   Training Prescription Yes Yes      Weight 5 5      Reps 10-15 10-15      Time 10 Minutes 10 Minutes        Recumbant Elliptical   Level 4 3      RPM 52 54      Minutes 39 39      METs 2.8 3.6               Exercise Comments:   Exercise Goals and Review:   Exercise Goals     Row Name 07/01/22 1740 07/08/22 1250 08/05/22 1148 09/02/22 1251 09/30/22 0917     Exercise Goals   Increase Physical Activity _0    Intervention Provide advice, education, support and counseling about physical activity/exercise needs.;Develop an individualized exercise prescription for aerobic and resistive training based on initial evaluation findings, risk stratification, comorbidities and participant's personal goals. Provide advice,  education, support and counseling about physical activity/exercise needs.;Develop an individualized exercise prescription for aerobic and resistive training based on initial evaluation findings, risk stratification, comorbidities and participant's personal goals. Provide advice, education, support and counseling about physical activity/exercise needs.;Develop an individualized exercise prescription for aerobic and resistive training based on initial evaluation findings, risk stratification, comorbidities and participant's personal goals. Provide advice, education, support and counseling about physical activity/exercise needs.;Develop an individualized exercise prescription for aerobic and resistive training based on initial evaluation findings, risk stratification, comorbidities and participant's personal goals. Provide advice, education, support and counseling about physical activity/exercise needs.;Develop an individualized exercise prescription for aerobic and resistive training based on initial evaluation findings, risk stratification, comorbidities and participant's personal goals.   Expected Outcomes Short Term: Attend rehab on a regular basis to increase amount of physical activity.;Long Term: Add in home exercise to make exercise part of routine and to increase amount of physical activity.;Long Term: Exercising regularly at least 3-5 days a week. Short Term: Attend rehab on a regular basis to increase amount of physical activity.;Long Term: Add in home exercise to make exercise part of routine and to  increase amount of physical activity.;Long Term: Exercising regularly at least 3-5 days a week. Short Term: Attend rehab on a regular basis to increase amount of physical activity.;Long Term: Add in home exercise to make exercise part of routine and to increase amount of physical activity.;Long Term: Exercising regularly at least 3-5 days a week. Short Term: Attend rehab on a regular basis to increase amount of  physical activity.;Long Term: Add in home exercise to make exercise part of routine and to increase amount of physical activity.;Long Term: Exercising regularly at least 3-5 days a week. Short Term: Attend rehab on a regular basis to increase amount of physical activity.;Long Term: Add in home exercise to make exercise part of routine and to increase amount of physical activity.;Long Term: Exercising regularly at least 3-5 days a week.   Increase Strength and Stamina _0    Intervention Provide advice, education, support and counseling about physical activity/exercise needs.;Develop an individualized exercise prescription for aerobic and resistive training based on initial evaluation findings, risk stratification, comorbidities and participant's personal goals. Provide advice, education, support and counseling about physical activity/exercise needs.;Develop an individualized exercise prescription for aerobic and resistive training based on initial evaluation findings, risk stratification, comorbidities and participant's personal goals. Provide advice, education, support and counseling about physical activity/exercise needs.;Develop an individualized exercise prescription for aerobic and resistive training based on initial evaluation findings, risk stratification, comorbidities and participant's personal goals. Provide advice, education, support and counseling about physical activity/exercise needs.;Develop an individualized exercise prescription for aerobic and resistive training based on initial evaluation findings, risk stratification, comorbidities and participant's personal goals. Provide advice, education, support and counseling about physical activity/exercise needs.;Develop an individualized exercise prescription for aerobic and resistive training based on initial evaluation findings, risk stratification, comorbidities and participant's personal goals.   Expected Outcomes Short Term:  Increase workloads from initial exercise prescription for resistance, speed, and METs.;Short Term: Perform resistance training exercises routinely during rehab and add in resistance training at home;Long Term: Improve cardiorespiratory fitness, muscular endurance and strength as measured by increased METs and functional capacity (6MWT) Short Term: Increase workloads from initial exercise prescription for resistance, speed, and METs.;Short Term: Perform resistance training exercises routinely during rehab and add in resistance training at home;Long Term: Improve cardiorespiratory fitness, muscular endurance and strength as measured by increased METs and functional capacity (6MWT) Short Term: Increase workloads from initial exercise prescription for resistance, speed, and METs.;Short Term: Perform resistance training exercises routinely during rehab and add in resistance training at home;Long Term: Improve cardiorespiratory fitness, muscular endurance and strength as measured by increased METs and functional capacity (6MWT) Short Term: Increase workloads from initial exercise prescription for resistance, speed, and METs.;Short Term: Perform resistance training exercises routinely during rehab and add in resistance training at home;Long Term: Improve cardiorespiratory fitness, muscular endurance and strength as measured by increased METs and functional capacity (6MWT) Short Term: Increase workloads from initial exercise prescription for resistance, speed, and METs.;Short Term: Perform resistance training exercises routinely during rehab and add in resistance training at home;Long Term: Improve cardiorespiratory fitness, muscular endurance and strength as measured by increased METs and functional capacity (6MWT)   Able to understand and use rate of perceived exertion (RPE) scale _1    Intervention Provide education and explanation on how to use RPE scale Provide education and explanation on how to use  RPE scale Provide education and explanation on how to use RPE scale Provide education and explanation on how to use RPE scale Provide  education and explanation on how to use RPE scale   Expected Outcomes Short Term: Able to use RPE daily in rehab to express subjective intensity level;Long Term:  Able to use RPE to guide intensity level when exercising independently Short Term: Able to use RPE daily in rehab to express subjective intensity level;Long Term:  Able to use RPE to guide intensity level when exercising independently Short Term: Able to use RPE daily in rehab to express subjective intensity level;Long Term:  Able to use RPE to guide intensity level when exercising independently Short Term: Able to use RPE daily in rehab to express subjective intensity level;Long Term:  Able to use RPE to guide intensity level when exercising independently Short Term: Able to use RPE daily in rehab to express subjective intensity level;Long Term:  Able to use RPE to guide intensity level when exercising independently   Able to understand and use Dyspnea scale _0    Intervention Provide education and explanation on how to use Dyspnea scale Provide education and explanation on how to use Dyspnea scale Provide education and explanation on how to use Dyspnea scale Provide education and explanation on how to use Dyspnea scale Provide education and explanation on how to use Dyspnea scale   Expected Outcomes Short Term: Able to use Dyspnea scale daily in rehab to express subjective sense of shortness of breath during exertion;Long Term: Able to use Dyspnea scale to guide intensity level when exercising independently Short Term: Able to use Dyspnea scale daily in rehab to express subjective sense of shortness of breath during exertion;Long Term: Able to use Dyspnea scale to guide intensity level when exercising independently Short Term: Able to use Dyspnea scale daily in rehab to express subjective sense of  shortness of breath during exertion;Long Term: Able to use Dyspnea scale to guide intensity level when exercising independently Short Term: Able to use Dyspnea scale daily in rehab to express subjective sense of shortness of breath during exertion;Long Term: Able to use Dyspnea scale to guide intensity level when exercising independently Short Term: Able to use Dyspnea scale daily in rehab to express subjective sense of shortness of breath during exertion;Long Term: Able to use Dyspnea scale to guide intensity level when exercising independently   Knowledge and understanding of Target Heart Rate Range (THRR) Yes -- Yes Yes Yes   Intervention Provide education and explanation of THRR including how the numbers were predicted and where they are located for reference Provide education and explanation of THRR including how the numbers were predicted and where they are located for reference Provide education and explanation of THRR including how the numbers were predicted and where they are located for reference Provide education and explanation of THRR including how the numbers were predicted and where they are located for reference Provide education and explanation of THRR including how the numbers were predicted and where they are located for reference   Expected Outcomes Short Term: Able to state/look up THRR;Short Term: Able to use daily as guideline for intensity in rehab;Long Term: Able to use THRR to govern intensity when exercising independently Short Term: Able to state/look up THRR;Short Term: Able to use daily as guideline for intensity in rehab;Long Term: Able to use THRR to govern intensity when exercising independently Short Term: Able to state/look up THRR;Short Term: Able to use daily as guideline for intensity in rehab;Long Term: Able to use THRR to govern intensity when exercising independently Short Term: Able to state/look up THRR;Short Term: Able to  use daily as guideline for intensity in  rehab;Long Term: Able to use THRR to govern intensity when exercising independently Short Term: Able to state/look up THRR;Short Term: Able to use daily as guideline for intensity in rehab;Long Term: Able to use THRR to govern intensity when exercising independently   Understanding of Exercise Prescription _0    Intervention Provide education, explanation, and written materials on patient's individual exercise prescription Provide education, explanation, and written materials on patient's individual exercise prescription Provide education, explanation, and written materials on patient's individual exercise prescription Provide education, explanation, and written materials on patient's individual exercise prescription Provide education, explanation, and written materials on patient's individual exercise prescription   Expected Outcomes Short Term: Able to explain program exercise prescription;Long Term: Able to explain home exercise prescription to exercise independently Short Term: Able to explain program exercise prescription;Long Term: Able to explain home exercise prescription to exercise independently Short Term: Able to explain program exercise prescription;Long Term: Able to explain home exercise prescription to exercise independently Short Term: Able to explain program exercise prescription;Long Term: Able to explain home exercise prescription to exercise independently Short Term: Able to explain program exercise prescription;Long Term: Able to explain home exercise prescription to exercise independently            Exercise Goals Re-Evaluation :  Exercise Goals Re-Evaluation     Row Name 07/08/22 1251 08/05/22 1148 09/02/22 1251 09/30/22 0918       Exercise Goal Re-Evaluation   Exercise Goals Review Increase Physical Activity;Increase Strength and Stamina;Able to understand and use rate of perceived exertion (RPE) scale;Able to understand and use Dyspnea scale;Able to check  pulse independently;Understanding of Exercise Prescription Increase Physical Activity;Increase Strength and Stamina;Able to understand and use rate of perceived exertion (RPE) scale;Able to understand and use Dyspnea scale;Knowledge and understanding of Target Heart Rate Range (THRR);Understanding of Exercise Prescription Increase Physical Activity;Increase Strength and Stamina;Able to understand and use rate of perceived exertion (RPE) scale;Able to understand and use Dyspnea scale;Knowledge and understanding of Target Heart Rate Range (THRR);Understanding of Exercise Prescription Increase Physical Activity;Increase Strength and Stamina;Able to understand and use rate of perceived exertion (RPE) scale;Able to understand and use Dyspnea scale;Knowledge and understanding of Target Heart Rate Range (THRR);Understanding of Exercise Prescription    Comments Pt has completed 2 sessions of PR. He is motivated during class and focused on increasing his distance on each class. He is currently exercising at 3.0 METs on the ellp. Will continue to monitor and progress as able. Pt has completed 9 sessions of PR. He is focued on increasing his level and distance for each class. He is currently exercising at 2.9 METs on the ellp. Pt also has leg pain when first increasing his levels, he stated he is to see a vein specialist soon. Will monitor and progres as able. Pt has completed 17 sessions of PR. He is focused on increasing his distance each class and has increased his level. He is currently exercising at 1.7 METs on the ellp. Will monitor and progress as able. Pt has completed 22 sessions of PR. He continues to push himself and increase his distance. He is increasing his workloads. He is currently exercising at 3.6 METs on the ellp. Will continue to monitor and progress as able.    Expected Outcomes Through exercise at rehab and at home, the patient will meet their stated goals. Through exercise at rehab and at home, the  patient will meet their stated goals. Through exercise at rehab  and at home, the patient will meet their stated goals. Through exercise at rehab and at home, the patient will meet their stated goals.             Discharge Exercise Prescription (Final Exercise Prescription Changes):  Exercise Prescription Changes - 09/25/22 1200       Response to Exercise   Blood Pressure (Admit) 110/58    Blood Pressure (Exercise) 120/58    Blood Pressure (Exit) 108/58    Heart Rate (Admit) 59 bpm    Heart Rate (Exercise) 67 bpm    Heart Rate (Exit) 66 bpm    Oxygen Saturation (Admit) 96 %    Oxygen Saturation (Exercise) 93 %    Oxygen Saturation (Exit) 97 %    Rating of Perceived Exertion (Exercise) 13    Perceived Dyspnea (Exercise) 13    Duration Continue with 30 min of aerobic exercise without signs/symptoms of physical distress.    Intensity THRR unchanged      Progression   Progression Continue to progress workloads to maintain intensity without signs/symptoms of physical distress.      Resistance Training   Training Prescription Yes    Weight 5    Reps 10-15    Time 10 Minutes      Recumbant Elliptical   Level 3    RPM 54    Minutes 39    METs 3.6             Nutrition:  Target Goals: Understanding of nutrition guidelines, daily intake of sodium <1540m, cholesterol <2041m calories 30% from fat and 7% or less from saturated fats, daily to have 5 or more servings of fruits and vegetables.  Biometrics:  Pre Biometrics - 07/01/22 0953       Pre Biometrics   Height _0  (1.854 m)    Weight 99.5 kg    Waist Circumference 44 inches    Hip Circumference 43 inches    Waist to Hip Ratio 1.02 %    BMI (Calculated) 28.95    Triceps Skinfold 20 mm    % Body Fat 30.6 %    Grip Strength 45 kg    Flexibility 0 in    Single Leg Stand 4.35 seconds              Nutrition Therapy Plan and Nutrition Goals:  Nutrition Therapy & Goals - 07/02/22 0848       Personal  Nutrition Goals   Comments Patient scored 57 on his diet assessment. We offer 2 educational sessions on heart healthy nutrition with handouts.      Intervention Plan   Intervention Nutrition handout(s) given to patient.    Expected Outcomes Short Term Goal: Understand basic principles of dietary content, such as calories, fat, sodium, cholesterol and nutrients.             Nutrition Assessments:  Nutrition Assessments - 07/01/22 0902       MEDFICTS Scores   Pre Score 57            MEDIFICTS Score Key: ?70 Need to make dietary changes  40-70 Heart Healthy Diet ? 40 Therapeutic Level Cholesterol Diet   Picture Your Plate Scores: <4<40nhealthy dietary pattern with much room for improvement. 41-50 Dietary pattern unlikely to meet recommendations for good health and room for improvement. 51-60 More healthful dietary pattern, with some room for improvement.  >60 Healthy dietary pattern, although there may be some specific behaviors that could be improved.  Nutrition Goals Re-Evaluation:   Nutrition Goals Discharge (Final Nutrition Goals Re-Evaluation):   Psychosocial: Target Goals: Acknowledge presence or absence of significant depression and/or stress, maximize coping skills, provide positive support system. Participant is able to verbalize types and ability to use techniques and skills needed for reducing stress and depression.  Initial Review & Psychosocial Screening:  Initial Psych Review & Screening - 07/01/22 0859       Initial Review   Current issues with Current Sleep Concerns;Current Anxiety/Panic      Family Dynamics   Good Support System? Yes    Comments His support system includes his wife, sons, and grand children.      Barriers   Psychosocial barriers to participate in program There are no identifiable barriers or psychosocial needs.      Screening Interventions   Interventions Encouraged to exercise    Expected Outcomes Long Term goal: The  participant improves quality of Life and PHQ9 Scores as seen by post scores and/or verbalization of changes;Short Term goal: Identification and review with participant of any Quality of Life or Depression concerns found by scoring the questionnaire.             Quality of Life Scores:  Quality of Life - 07/01/22 0954       Quality of Life   Select Quality of Life      Quality of Life Scores   Health/Function Pre 17.13 %    Socioeconomic Pre 21.75 %    Psych/Spiritual Pre 19.93 %    Family Pre 26.4 %    GLOBAL Pre 19.97 %            Scores of 19 and below usually indicate a poorer quality of life in these areas.  A difference of  2-3 points is a clinically meaningful difference.  A difference of 2-3 points in the total score of the Quality of Life Index has been associated with significant improvement in overall quality of life, self-image, physical symptoms, and general health in studies assessing change in quality of life.   PHQ-9: Review Flowsheet  More data exists      07/01/2022 06/09/2022 04/02/2022 03/24/2022 03/13/2022  Depression screen PHQ 2/9  Decreased Interest 3 0 0 0 0  Down, Depressed, Hopeless 1 0 0 0 0  PHQ - 2 Score 4 0 0 0 0  Altered sleeping 3 - - - -  Tired, decreased energy 3 - - - -  Change in appetite 1 - - - -  Feeling bad or failure about yourself  1 - - - -  Trouble concentrating 1 - - - -  Moving slowly or fidgety/restless 3 - - - -  Suicidal thoughts 0 - - - -  PHQ-9 Score 16 - - - -  Difficult doing work/chores Very difficult - - - -   Interpretation of Total Score  Total Score Depression Severity:  1-4 = Minimal depression, 5-9 = Mild depression, 10-14 = Moderate depression, 15-19 = Moderately severe depression, 20-27 = Severe depression   Psychosocial Evaluation and Intervention:  Psychosocial Evaluation - 07/01/22 0937       Psychosocial Evaluation & Interventions   Interventions Encouraged to exercise with the program and follow  exercise prescription    Comments Pt has to participate in PR. He does have current anxiety and sleep concerns. He reports that he takes xanax PRN on days when he is "really on edge", but this is only about once per week. He states that  a months supply of xanax lasts him 6-8 months. He does take ambien for his sleep, and he reports that this helps some, but he is unable to stay asleep for more than 2 hours at a time. 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 He scored a 16 on his PHQ-9, and he relates most of this to his peripheral vascular disease in his legs and his lack of sleep. He is no longer able to walk long distances due to his leg pains and fatigue. He denies have depression or any thoughs of harming himself. He reports that he has a good support system with his wife, 3 sons, and 10 grandchildren. His goals while in the program are to improve his leg strength and stamina, so that he can be able to walk longer distances again. He has participated in the PR and CR programs before and reported that he benefited from them, so he is eager to start the program again.    Expected Outcomes Pt's anxiety and sleep concerns will continue to be treated, and he will have no other identifiable psychosocial issues.    Continue Psychosocial Services  No Follow up required             Psychosocial Re-Evaluation:  Psychosocial Re-Evaluation     [place] Name 07/02/22 9604 07/30/22 1404 08/26/22 0838 09/23/22 1006       Psychosocial Re-Evaluation   Current issues with Current Anxiety/Panic;Current Sleep Concerns Current Anxiety/Panic;Current Sleep Concerns Current Anxiety/Panic;Current Sleep Concerns Current Anxiety/Panic;Current Sleep Concerns    Comments Patient is new to the program. He plans to start tomorrow 8/10. We will continue to monitor his progress in the program. Patient has cpmpleted 8 sessions.  Reports  having great support from family.  We will continue to monitor his progress in the program. Patient has completed 15 sessions.  Great support from wife.  Pt seems to be enjoying the program and is progression well.  Plan of care is ongoing.  No concerns as of present. Patient has completed 21 sessions.  Supported by wife.  Pt seems to be enjoying the program and is progression well.  Plan of care is ongoing.  No concerns as of present.    Expected Outcomes Patient will continue to have no psychosocial barriers and his anxiety and sleep will continue to be managed with Alprazolam and Ambien prn. Patient will continue to have no psychosocial barriers and his anxiety and sleep will continue to be managed with Alprazolam and Ambien prn. Patient will continue to have no psychosocial barriers.  Patients anxiety and sleep issues will continue to be managed with Alprazolam and Ambien prn. Patient will continue to have no psychosocial barriers.  Patients anxiety and sleep issues will continue to be managed with Alprazolam and Ambien prn.    Interventions Encouraged to attend Pulmonary Rehabilitation for the exercise;Relaxation education;Stress management education Encouraged to attend Pulmonary Rehabilitation for the exercise;Relaxation education;Stress management education Encouraged to attend Pulmonary Rehabilitation for the exercise;Relaxation education;Stress management education Encouraged to attend Pulmonary Rehabilitation for the exercise;Relaxation education;Stress management education    Continue Psychosocial Services  No Follow up required No Follow up required No Follow up required No Follow up required             Psychosocial Discharge (Final Psychosocial Re-Evaluation):  Psychosocial Re-Evaluation - 09/23/22 1006       Psychosocial Re-Evaluation   Current issues with  Current Anxiety/Panic;Current Sleep Concerns    Comments Patient has completed 21 sessions.  Supported by wife.  Pt seems to be  enjoying the program and is progression well.  Plan of care is ongoing.  No concerns as of present.    Expected Outcomes Patient will continue to have no psychosocial barriers.  Patients anxiety and sleep issues will continue to be managed with Alprazolam and Ambien prn.    Interventions Encouraged to attend Pulmonary Rehabilitation for the exercise;Relaxation education;Stress management education    Continue Psychosocial Services  No Follow up required              Education: Education Goals: Education classes will be provided on a weekly basis, covering required topics. Participant will state understanding/return demonstration of topics presented.  Learning Barriers/Preferences:  Learning Barriers/Preferences - 07/01/22 5852       Learning Barriers/Preferences   Learning Barriers None    Learning Preferences Skilled Demonstration;Individual Instruction             Education Topics: How Lungs Work and Diseases: - Discuss the anatomy of the lungs and diseases that can affect the lungs, such as COPD. Flowsheet Row PULMONARY REHAB CHRONIC OBSTRUCTIVE PULMONARY DISEASE from 09/25/2022 in [place]  Date 08/28/22  Educator DF  Instruction Review Code 1- Verbalizes Understanding       Exercise: -Discuss the importance of exercise, FITT principles of exercise, normal and abnormal responses to exercise, and how to exercise safely.   Environmental Irritants: -Discuss types of environmental irritants and how to limit exposure to environmental irritants. Flowsheet Row PULMONARY REHAB CHRONIC OBSTRUCTIVE PULMONARY DISEASE from 09/25/2022 in [place]  Date 09/04/22  Educator [place]  Instruction Review Code 1- Verbalizes Understanding       Meds/Inhalers and oxygen: - Discuss respiratory medications, definition of an inhaler and oxygen, and the proper way to use an inhaler and oxygen.   Energy Saving Techniques: - Discuss  methods to conserve energy and decrease shortness of breath when performing activities of daily living.  Flowsheet Row PULMONARY REHAB CHRONIC OBSTRUCTIVE PULMONARY DISEASE from 09/25/2022 in [place]  Date 09/18/22  Educator Handout  Instruction Review Code 1- Verbalizes Understanding       Bronchial Hygiene / Breathing Techniques: - Discuss breathing mechanics, pursed-lip breathing technique,  proper posture, effective ways to clear airways, and other functional breathing techniques Flowsheet Row PULMONARY REHAB CHRONIC OBSTRUCTIVE PULMONARY DISEASE from 09/25/2022 in [place]  Date 09/25/22  Educator HB  Instruction Review Code 1- Research scientist (medical): - Provides group verbal and written instruction about the health risks of elevated stress, cause of high stress, and healthy ways to reduce stress. Flowsheet Row PULMONARY REHAB CHRONIC OBSTRUCTIVE PULMONARY DISEASE from 09/25/2022 in [place]  Date 07/03/22  Educator DF  Instruction Review Code 2- Demonstrated Understanding       Nutrition I: Fats: - Discuss the types of cholesterol, what cholesterol does to the body, and how cholesterol levels can be controlled. Flowsheet Row PULMONARY REHAB CHRONIC OBSTRUCTIVE PULMONARY DISEASE from 09/25/2022 in [place]  Date 07/10/22  Educator Handout  Instruction Review Code 1- Verbalizes Understanding       Nutrition II: Labels: -Discuss the different components of food labels and how to read food labels. Flowsheet Row PULMONARY REHAB CHRONIC OBSTRUCTIVE PULMONARY DISEASE from 09/25/2022 in [place]  Date 07/17/22  Educator [place]  Instruction  Review Code 1- Verbalizes Understanding       Respiratory Infections: - Discuss the signs and symptoms of respiratory infections, ways to prevent respiratory infections, and the importance of  seeking medical treatment when having a respiratory infection. Flowsheet Row PULMONARY REHAB CHRONIC OBSTRUCTIVE PULMONARY DISEASE from 09/25/2022 in [place]  Date 07/31/22  Educator Handout  Instruction Review Code 1- Verbalizes Understanding       Stress I: Signs and Symptoms: - Discuss the causes of stress, how stress may lead to anxiety and depression, and ways to limit stress. Flowsheet Row PULMONARY REHAB CHRONIC OBSTRUCTIVE PULMONARY DISEASE from 09/25/2022 in [place]  Date 08/07/22  Educator DF  Instruction Review Code 2- Demonstrated Understanding       Stress II: Relaxation: -Discuss relaxation techniques to limit stress. Flowsheet Row PULMONARY REHAB CHRONIC OBSTRUCTIVE PULMONARY DISEASE from 09/25/2022 in [place]  Date 08/14/22  Educator DM  Instruction Review Code 1- Verbalizes Understanding       Oxygen for Home/Travel: - Discuss how to prepare for travel when on oxygen and proper ways to transport and store oxygen to ensure safety. Flowsheet Row PULMONARY REHAB CHRONIC OBSTRUCTIVE PULMONARY DISEASE from 09/25/2022 in [place]  Date 08/21/22  Educator DF  Instruction Review Code 2- Demonstrated Understanding       Knowledge Questionnaire Score:  Knowledge Questionnaire Score - 07/01/22 0905       Knowledge Questionnaire Score   Pre Score 15/18             Core Components/Risk Factors/Patient Goals at Admission:  Personal Goals and Risk Factors at Admission - 07/01/22 0908       Core Components/Risk Factors/Patient Goals on Admission   Improve shortness of breath with ADL's Yes    Intervention Provide education, individualized exercise plan and daily activity instruction to help decrease symptoms of SOB with activities of daily living.    Expected Outcomes Short Term: Improve cardiorespiratory fitness to achieve a reduction of symptoms when  performing ADLs;Long Term: Be able to perform more ADLs without symptoms or delay the onset of symptoms    Increase knowledge of respiratory medications and ability to use respiratory devices properly  Yes    Intervention Provide education and demonstration as needed of appropriate use of medications, inhalers, and oxygen therapy.    Expected Outcomes Short Term: Achieves understanding of medications use. Understands that oxygen is a medication prescribed by physician. Demonstrates appropriate use of inhaler and oxygen therapy.;Long Term: Maintain appropriate use of medications, inhalers, and oxygen therapy.    Personal Goal Other Yes    Personal Goal Improve leg strength and stamina.    Intervention Attend PR two days per week and begin a home exercise plan.    Expected Outcomes Pt will reach stated goals.             Core Components/Risk Factors/Patient Goals Review:   Goals and Risk Factor Review     Row Name 07/02/22 0849 07/30/22 1410 08/26/22 0903 09/23/22 1013       Core Components/Risk Factors/Patient Goals Review   Personal Goals Review Improve shortness of breath with ADL's;Other Improve shortness of breath with ADL's;Other Improve shortness of breath with ADL's;Other Improve shortness of breath with ADL's;Other    Review Patient was referred to PR with COPD from [place]. He plans to strart the program tomorrow 07/03/22. His personal goals are to improve his strength and stamina in his legs. We will continue to  monitor his progress as he works towards meeting these goals. Pt has completed 8 sessions.  He is progressing well.  VSS.  Sats are 91-96% on room air.  His personal goals are to improve his strength and stamina in his legs. We will continue to monitor his progress as he works towards meeting these goals. Pt has completed 15 sessions.  VSS.  Sats are 90-97% on room air.  His personal goals are to improve his strength and stamina in his legs. We will continue to monitor his progress  as he works towards meeting these goals. Pt has completed 21 sessions.  VSS.  Sats are 90-95% on room air.  His personal goals are to improve his strength and stamina in his legs. We will continue to monitor his progress as he works towards meeting these goals.    Expected Outcomes Patient will complete the program meeting both personal and program goals. Patient will complete the program meeting both personal and program goals. Patient will complete the program meeting both personal and program goals. Patient will complete the program meeting both personal and program goals.             Core Components/Risk Factors/Patient Goals at Discharge (Final Review):   Goals and Risk Factor Review - 09/23/22 1013       Core Components/Risk Factors/Patient Goals Review   Personal Goals Review Improve shortness of breath with ADL's;Other    Review Pt has completed 21 sessions.  VSS.  Sats are 90-95% on room air.  His personal goals are to improve his strength and stamina in his legs. We will continue to monitor his progress as he works towards meeting these goals.    Expected Outcomes Patient will complete the program meeting both personal and program goals.             ITP Comments:   Comments: ITP REVIEW Pt is making expected progress toward pulmonary rehab goals after completing 23 sessions. Recommend continued exercise, life style modification, education, and utilization of breathing techniques to increase stamina and strength and decrease shortness of breath with exertion.

## 2022-10-02 ENCOUNTER — Encounter (HOSPITAL_COMMUNITY)
Admission: RE | Admit: 2022-10-02 | Discharge: 2022-10-02 | Disposition: A | Discharge status: Home / Self Care | Payer: Medicare Other | Source: Ambulatory Visit | Attending: Internal Medicine | Admitting: Internal Medicine

## 2022-10-02 DIAGNOSIS — J449 Chronic obstructive pulmonary disease, unspecified: Secondary | ICD-10-CM

## 2022-10-02 NOTE — Progress Notes (Signed)
Daily Session Note  Patient Details  Name: Dennis [name] MRN: [id number] Date of Birth: 1951/07/19 Referring Provider:   Flowsheet Row PULMONARY REHAB COPD ORIENTATION from 07/01/2022 in [place]  Referring Provider Dr. Melvyn Novas       Encounter Date: 10/02/2022  Check In:  Session Check In - 10/02/22 1045       Check-In   Supervising physician immediately available to respond to emergencies [facility] MD immediately available    Physician(s) Dr Dellia Cloud    Location [facility]    Staff Present Leana Roe, BS, Exercise Physiologist;Christyann Manolis Hassell Done, RN, BSN    Virtual Visit No    Medication changes reported     No    Fall or balance concerns reported    No    Tobacco Cessation No Change    Warm-up and Cool-down Performed as group-led instruction    Resistance Training Performed Yes    VAD Patient? No    PAD/SET Patient? No      Pain Assessment   Currently in Pain? No/denies    Pain Score 0-No pain    Multiple Pain Sites No             Capillary Blood Glucose: No results found for this or any previous visit (from the past 24 hour(s)).    Social History   Tobacco Use  Smoking Status Former   Packs/day: 2.00   Years: 27.00   Total pack years: 54.00   Types: Cigarettes   Start date: 02/01/1971   Quit date: 01/31/1998   Years since quitting: 24.6  Smokeless Tobacco Current   Types: Chew    Goals Met:  Proper associated with RPD/PD & O2 Sat Independence with exercise equipment Using PLB without cueing & demonstrates good technique Exercise tolerated well Queuing for purse lip breathing No report of concerns or symptoms today Strength training completed today  Goals Unmet:  Not Applicable  Comments: Checkout at 1145.   Dr. Kathie Dike is Medical Director for [facility].

## 2022-10-07 ENCOUNTER — Encounter (HOSPITAL_COMMUNITY)
Admission: RE | Admit: 2022-10-07 | Discharge: 2022-10-07 | Disposition: A | Discharge status: Home / Self Care | Payer: Medicare Other | Source: Ambulatory Visit | Attending: Internal Medicine | Admitting: Internal Medicine

## 2022-10-07 DIAGNOSIS — J449 Chronic obstructive pulmonary disease, unspecified: Secondary | ICD-10-CM

## 2022-10-07 NOTE — Progress Notes (Signed)
Daily Session Note  Patient Details  Name: Dennis [name] MRN: [id number] Date of Birth: November 26, 1950 Referring Provider:   Flowsheet Row PULMONARY REHAB COPD ORIENTATION from 07/01/2022 in [place]  Referring Provider Dr. Melvyn Novas       Encounter Date: 10/07/2022  Check In:  Session Check In - 10/07/22 1042       Check-In   Supervising physician immediately available to respond to emergencies [facility] immediately available    Physician(s) Dr Harl Bowie    Location [facility]    Staff Present Leana Roe, BS, Exercise Physiologist;Shanvi Moyd Hassell Done, RN, BSN    Virtual Visit No    Medication changes reported     No    Fall or balance concerns reported    No    Tobacco Cessation No Change    Warm-up and Cool-down Performed as group-led instruction    Resistance Training Performed Yes    VAD Patient? No    PAD/SET Patient? No      Pain Assessment   Currently in Pain? No/denies    Pain Score 0-No pain    Multiple Pain Sites No             Capillary Blood Glucose: No results found for this or any previous visit (from the past 24 hour(s)).    Social History   Tobacco Use  Smoking Status Former   Packs/day: 2.00   Years: 27.00   Total pack years: 54.00   Types: Cigarettes   Start date: 02/01/1971   Quit date: 01/31/1998   Years since quitting: 24.6  Smokeless Tobacco Current   Types: Chew    Goals Met:  Proper associated with RPD/PD & O2 Sat Independence with exercise equipment Using PLB without cueing & demonstrates good technique Exercise tolerated well Queuing for purse lip breathing No report of concerns or symptoms today Strength training completed today  Goals Unmet:  Not Applicable  Comments: Checkout at 1145.   Dr. Kathie Dike is Medical Director for [facility].

## 2022-10-09 ENCOUNTER — Encounter (HOSPITAL_COMMUNITY)
Admission: RE | Admit: 2022-10-09 | Discharge: 2022-10-09 | Disposition: A | Discharge status: Home / Self Care | Payer: Medicare Other | Source: Ambulatory Visit | Attending: Internal Medicine | Admitting: Internal Medicine

## 2022-10-09 DIAGNOSIS — J449 Chronic obstructive pulmonary disease, unspecified: Secondary | ICD-10-CM | POA: Diagnosis not present

## 2022-10-09 NOTE — Progress Notes (Signed)
Daily Session Note  Patient Details  Name: Dennis [name] MRN: [id number] Date of Birth: 1951-08-16 Referring Provider:   Flowsheet Row PULMONARY REHAB COPD ORIENTATION from 07/01/2022 in [place]  Referring Provider Dr. Melvyn Novas       Encounter Date: 10/09/2022  Check In:  Session Check In - 10/09/22 1043       Check-In   Supervising physician immediately available to respond to emergencies [facility] immediately available    Physician(s) Dr Mellody Dance    Location [facility]    Staff Present Leana Roe, BS, Exercise Physiologist;Kalayna Noy Hassell Done, RN, BSN;Dalton Kris Mouton MHA, MS, ACSM-CEP    Virtual Visit No    Medication changes reported     No    Fall or balance concerns reported    No    Tobacco Cessation No Change    Warm-up and Cool-down Performed as group-led instruction    Resistance Training Performed Yes    VAD Patient? No    PAD/SET Patient? No      Pain Assessment   Currently in Pain? No/denies    Pain Score 0-No pain    Multiple Pain Sites No             Capillary Blood Glucose: No results found for this or any previous visit (from the past 24 hour(s)).    Social History   Tobacco Use  Smoking Status Former   Packs/day: 2.00   Years: 27.00   Total pack years: 54.00   Types: Cigarettes   Start date: 02/01/1971   Quit date: 01/31/1998   Years since quitting: 24.7  Smokeless Tobacco Current   Types: Chew    Goals Met:  Proper associated with RPD/PD & O2 Sat Independence with exercise equipment Using PLB without cueing & demonstrates good technique Exercise tolerated well Queuing for purse lip breathing No report of concerns or symptoms today Strength training completed today  Goals Unmet:  Not Applicable  Comments: Checkout at 1145.   Dr. Kathie Dike is Medical Director for [facility].

## 2022-10-13 ENCOUNTER — Ambulatory Visit: Payer: Medicare Other | Admitting: Internal Medicine

## 2022-10-13 DIAGNOSIS — H2513 Age-related nuclear cataract, bilateral: Secondary | ICD-10-CM | POA: Diagnosis not present

## 2022-10-13 DIAGNOSIS — H524 Presbyopia: Secondary | ICD-10-CM | POA: Diagnosis not present

## 2022-10-14 ENCOUNTER — Encounter (HOSPITAL_COMMUNITY): Payer: Medicare Other

## 2022-10-16 ENCOUNTER — Encounter (HOSPITAL_COMMUNITY): Payer: Medicare Other

## 2022-10-21 ENCOUNTER — Encounter (HOSPITAL_COMMUNITY)
Admission: RE | Admit: 2022-10-21 | Discharge: 2022-10-21 | Disposition: A | Discharge status: Home / Self Care | Payer: Medicare Other | Source: Ambulatory Visit | Attending: Internal Medicine | Admitting: Internal Medicine

## 2022-10-21 DIAGNOSIS — J449 Chronic obstructive pulmonary disease, unspecified: Secondary | ICD-10-CM | POA: Diagnosis not present

## 2022-10-21 NOTE — Progress Notes (Signed)
Daily Session Note  Patient Details  Name: Dennis [name] MRN: [id number] Date of Birth: [birth year] Referring Provider:   Flowsheet Row PULMONARY REHAB COPD ORIENTATION from 07/01/2022 in [place]  Referring Provider Dr. Melvyn Novas       Encounter Date: 10/21/2022  Check In:  Session Check In - 10/21/22 1040       Check-In   Supervising physician immediately available to respond to emergencies [facility] immediately available    Physician(s) Dr Dellia Cloud    Location [facility]    Staff Present Leana Roe, BS, Exercise Physiologist;Courtnei Ruddell Hassell Done, RN, BSN;Dalton Kris Mouton MHA, MS, ACSM-CEP    Virtual Visit No    Medication changes reported     No    Fall or balance concerns reported    No    Tobacco Cessation No Change    Warm-up and Cool-down Performed as group-led instruction    Resistance Training Performed Yes    VAD Patient? No    PAD/SET Patient? No      Pain Assessment   Currently in Pain? No/denies    Pain Score 0-No pain    Multiple Pain Sites No             Capillary Blood Glucose: No results found for this or any previous visit (from the past 24 hour(s)).    Social History   Tobacco Use  Smoking Status Former   Packs/day: 2.00   Years: 27.00   Total pack years: 54.00   Types: Cigarettes   Start date: 02/01/1971   Quit date: 01/31/1998   Years since quitting: 24.7  Smokeless Tobacco Current   Types: Chew    Goals Met:  Proper associated with RPD/PD & O2 Sat Independence with exercise equipment Using PLB without cueing & demonstrates good technique Exercise tolerated well Queuing for purse lip breathing No report of concerns or symptoms today Strength training completed today  Goals Unmet:  Not Applicable  Comments: Checkout at 1145.   Dr. Kathie Dike is Medical Director for [facility].

## 2022-10-23 ENCOUNTER — Other Ambulatory Visit: Payer: Self-pay | Admitting: Internal Medicine

## 2022-10-23 ENCOUNTER — Encounter (HOSPITAL_COMMUNITY)
Admission: RE | Admit: 2022-10-23 | Discharge: 2022-10-23 | Disposition: A | Discharge status: Home / Self Care | Payer: Medicare Other | Source: Ambulatory Visit | Attending: Internal Medicine | Admitting: Internal Medicine

## 2022-10-23 DIAGNOSIS — J449 Chronic obstructive pulmonary disease, unspecified: Secondary | ICD-10-CM | POA: Diagnosis not present

## 2022-10-23 DIAGNOSIS — I10 Essential (primary) hypertension: Secondary | ICD-10-CM

## 2022-10-23 NOTE — Progress Notes (Signed)
Daily Session Note  Patient Details  Name: Dennis [name] MRN: [id number] Date of Birth: [birth year] Referring Provider:   Flowsheet Row PULMONARY REHAB COPD ORIENTATION from 07/01/2022 in [place]  Referring Provider Dr. Melvyn Novas       Encounter Date: 10/23/2022  Check In:  Session Check In - 10/23/22 1043       Check-In   Supervising physician immediately available to respond to emergencies [facility] immediately available    Physician(s) Dr Dellia Cloud    Location [facility]    Staff Present Leana Roe, BS, Exercise Physiologist;Leonardo Plaia Hassell Done, RN, BSN    Virtual Visit No    Medication changes reported     No    Fall or balance concerns reported    No    Tobacco Cessation No Change    Warm-up and Cool-down Performed as group-led instruction    Resistance Training Performed Yes    VAD Patient? No    PAD/SET Patient? No      Pain Assessment   Currently in Pain? No/denies    Pain Score 0-No pain    Multiple Pain Sites No             Capillary Blood Glucose: No results found for this or any previous visit (from the past 24 hour(s)).    Social History   Tobacco Use  Smoking Status Former   Packs/day: 2.00   Years: 27.00   Total pack years: 54.00   Types: Cigarettes   Start date: 02/01/1971   Quit date: 01/31/1998   Years since quitting: 24.7  Smokeless Tobacco Current   Types: Chew    Goals Met:  Proper associated with RPD/PD & O2 Sat Independence with exercise equipment Using PLB without cueing & demonstrates good technique Exercise tolerated well Queuing for purse lip breathing No report of concerns or symptoms today Strength training completed today  Goals Unmet:  Not Applicable  Comments: Checkout at 1145.   Dr. Kathie Dike is Medical Director for [facility].

## 2022-10-28 ENCOUNTER — Encounter (HOSPITAL_COMMUNITY)
Admission: RE | Admit: 2022-10-28 | Discharge: 2022-10-28 | Disposition: A | Discharge status: Home / Self Care | Payer: Medicare Other | Source: Ambulatory Visit | Attending: Internal Medicine | Admitting: Internal Medicine

## 2022-10-28 VITALS — Wt 212.7 lb

## 2022-10-28 DIAGNOSIS — J449 Chronic obstructive pulmonary disease, unspecified: Secondary | ICD-10-CM | POA: Diagnosis not present

## 2022-10-28 NOTE — Progress Notes (Signed)
Daily Session Note  Patient Details  Name: Dennis [name] MRN: [id number] Date of Birth: 1951-09-08 Referring Provider:   Flowsheet Row PULMONARY REHAB COPD ORIENTATION from 07/01/2022 in [place]  Referring Provider Dr. Melvyn Novas       Encounter Date: 10/28/2022  Check In:  Session Check In - 10/28/22 1044       Check-In   Supervising physician immediately available to respond to emergencies [facility] MD immediately available    Physician(s) Dr Harl Bowie    Location [facility]    Staff Present Leana Roe, BS, Exercise Physiologist;Avenell Sellers Hassell Done, RN, BSN    Virtual Visit No    Fall or balance concerns reported    No    Tobacco Cessation No Change    Warm-up and Cool-down Performed as group-led Higher education careers adviser Performed Yes    VAD Patient? No    PAD/SET Patient? No      Pain Assessment   Currently in Pain? No/denies    Pain Score 0-No pain    Multiple Pain Sites No             Capillary Blood Glucose: No results found for this or any previous visit (from the past 24 hour(s)).    Social History   Tobacco Use  Smoking Status Former   Packs/day: 2.00   Years: 27.00   Total pack years: 54.00   Types: Cigarettes   Start date: 02/01/1971   Quit date: 01/31/1998   Years since quitting: 24.7  Smokeless Tobacco Current   Types: Chew    Goals Met:  Proper associated with RPD/PD & O2 Sat Independence with exercise equipment Using PLB without cueing & demonstrates good technique Exercise tolerated well Queuing for purse lip breathing No report of concerns or symptoms today Strength training completed today  Goals Unmet:  Not Applicable  Comments: Checkout at 1145.   Dr. Kathie Dike is Medical Director for [facility] Re[facility].

## 2022-10-29 NOTE — Progress Notes (Signed)
Pulmonary Individual Treatment Plan  Patient Details  Name: Dennis [name] MRN: [id number] Date of Birth: 1951/11/14 Referring Provider:   Flowsheet Row PULMONARY REHAB COPD ORIENTATION from 07/01/2022 in [place]  Referring Provider Dr. Melvyn Novas       Initial Encounter Date:  Flowsheet Row PULMONARY REHAB COPD ORIENTATION from 07/01/2022 in [place]  Date 07/01/22       Visit Diagnosis: COPD mixed type ([place])  Patient's Home Medications on Admission:   Current Outpatient Medications:    albuterol (PROVENTIL) (2.5 MG/3ML) 0.083% nebulizer solution, Take 3 mLs (2.5 mg total) by nebulization every 6 (six) hours as needed for wheezing or shortness of breath., Disp: 150 mL, Rfl: 3   albuterol (VENTOLIN HFA) 108 (90 Base) MCG/ACT inhaler, Inhale 2 puffs into the lungs every 4 (four) hours as needed for wheezing., Disp: 18 g, Rfl: 5   ALPRAZolam (XANAX) 0.5 MG tablet, TAKE 1 TABLET BY MOUTH 2 TIMES DAILY AS NEEDED FOR ANXIETY., Disp: 30 tablet, Rfl: 3   aspirin EC 81 MG tablet, Take 1 tablet (81 mg total) by mouth daily. (Patient taking differently: Take 81 mg by mouth 2 (two) times a week.), Disp: 30 tablet, Rfl: 11   budesonide-formoterol (SYMBICORT) 160-4.5 MCG/ACT inhaler, TAKE 2 PUFFS FIRST THING IN MORNING AND THEN ANOTHER 2 PUFFS ABOUT 12 HOURS LATER., Disp: 30.6 each, Rfl: 1   ezetimibe-simvastatin (VYTORIN) 10-40 MG tablet, TAKE 1 TABLET BY MOUTH AT BEDTIME. TAKE 1 TABLET BY MOUTH EVERYDAY AT BEDTIME, Disp: 90 tablet, Rfl: 1   fenofibrate 160 MG tablet, TAKE 1 TABLET BY MOUTH EVERY DAY, Disp: 90 tablet, Rfl: 1   fish oil-omega-3 fatty acids 1000 MG capsule, Take 1 g by mouth in the morning and at bedtime., Disp: , Rfl:    guaiFENesin (MUCINEX) 600 MG 12 hr tablet, Take 1 tablet (600 mg total) by mouth 2 (two) times daily., Disp: 20 tablet, Rfl: 0   isosorbide mononitrate (IMDUR) 30 MG 24 hr tablet, Take 0.5 tablets (15 mg total) by mouth at  bedtime., Disp: 45 tablet, Rfl: 3   losartan-hydrochlorothiazide (HYZAAR) 100-25 MG tablet, TAKE 1 TABLET BY MOUTH EVERY DAY, Disp: 90 tablet, Rfl: 0   metoprolol tartrate (LOPRESSOR) 50 MG tablet, TAKE 1 TABLET BY MOUTH TWICE A DAY, Disp: 180 tablet, Rfl: 1   mupirocin ointment (BACTROBAN) 2 %, Apply 1 application topically 2 (two) times daily. (Patient taking differently: Apply 1 application  topically 2 (two) times daily as needed (wound care).), Disp: 22 g, Rfl: 0   nitroGLYCERIN (NITROSTAT) 0.4 MG SL tablet, PLACE 1 TABLET (0.4 MG TOTAL) UNDER THE TONGUE EVERY 5 (FIVE) MINUTES AS NEEDED., Disp: 25 tablet, Rfl: 3   Respiratory Therapy Supplies (NEBULIZER/TUBING/MOUTHPIECE) KIT, Nebulizer tubing and mouthpiece, Disp: 1 kit, Rfl: 0   tamsulosin (FLOMAX) 0.4 MG CAPS capsule, Take 1 capsule (0.4 mg total) by mouth daily., Disp: 90 capsule, Rfl: 3   Vitamin D, Ergocalciferol, (DRISDOL) 1.25 MG (50000 UNIT) CAPS capsule, Take 1 capsule (50,000 Units total) by mouth every 7 (seven) days., Disp: 12 capsule, Rfl: 1   zolpidem (AMBIEN) 10 MG tablet, Take 1 tablet (10 mg total) by mouth at bedtime as needed for sleep., Disp: 30 tablet, Rfl: 3  Past Medical History: Past Medical History:  Diagnosis Date   Atrial flutter ([place])    Diagnosed by ECG August 2015   Colitis    COPD (chronic obstructive pulmonary disease) (HCC)    Coronary atherosclerosis of native coronary  artery    Multivessel status post CABG   Essential hypertension    Hyperlipidemia    Insomnia     Tobacco Use: Social History   Tobacco Use  Smoking Status Former   Packs/day: 2.00   Years: 27.00   Total pack years: 54.00   Types: Cigarettes   Start date: 02/01/1971   Quit date: 01/31/1998   Years since quitting: 24.7  Smokeless Tobacco Current   Types: Chew    Labs: Review Flowsheet  More data exists      Latest Ref Rng & Units 07/23/2020 12/26/2020 07/03/2021 02/26/2022 07/08/2022  Labs for ITP Cardiac and Pulmonary Rehab   Cholestrol 100 - 199 mg/dL 299  280  232  - 238   LDL (calc) 0 - 99 mg/dL 192  193  162  - 161   HDL-C >39 mg/dL 43  42  43  - 43   Trlycerides 0 - 149 mg/dL 323  234  146  - 183   Hemoglobin A1c 4.8 - 5.6 % - 5.7  6.0  5.9  -    Capillary Blood Glucose: Lab Results  Component Value Date   GLUCAP 177 (H) 03/26/2020     Pulmonary Assessment Scores:  Pulmonary Assessment Scores     Row Name 07/01/22 0852         ADL UCSD   SOB Score total 40     Rest 1     Walk 2     Stairs 5     Bath 1     Dress 1     Shop 1       CAT Score   CAT Score 24       mMRC Score   mMRC Score 3             UCSD: Self-administered rating of dyspnea associated with activities of daily living (ADLs) 6-point scale (0 = "not at all" to 5 = "maximal or unable to do because of breathlessness")  Scoring Scores range from 0 to 120.  Minimally important difference is 5 units  CAT: CAT can identify the health impairment of COPD patients and is better correlated with disease progression.  CAT has a scoring range of zero to 40. The CAT score is classified into four groups of low (less than 10), medium (10 - 20), high (21-30) and very high (31-40) based on the impact level of disease on health status. A CAT score over 10 suggests significant symptoms.  A worsening CAT score could be explained by an exacerbation, poor medication adherence, poor inhaler technique, or progression of COPD or comorbid conditions.  CAT MCID is 2 points  mMRC: mMRC (Modified Medical Research Council) Dyspnea Scale is used to assess the degree of baseline functional disability in patients of respiratory disease due to dyspnea. No minimal important difference is established. A decrease in score of 1 point or greater is considered a positive change.   Pulmonary Function Assessment:   Exercise Target Goals: Exercise Program Goal: Individual exercise prescription set using results from initial 6 min walk test and THRR  while considering  patient's activity barriers and safety.   Exercise Prescription Goal: Initial exercise prescription builds to 30-45 minutes a day of aerobic activity, 2-3 days per week.  Home exercise guidelines will be given to patient during program as part of exercise prescription that the participant will acknowledge.  Activity Barriers & Risk Stratification:  Activity Barriers & Cardiac Risk Stratification - 07/01/22 7416  Activity Barriers & Cardiac Risk Stratification   Activity Barriers Other (comment)    Comments peripheral vascular disease    Cardiac Risk Stratification High             6 Minute Walk:  6 Minute Walk     Row Name 07/01/22 0947         6 Minute Walk   Phase Initial     Distance 1200 feet     Walk Time 6 minutes     # of Rest Breaks 0     MPH 2.27     METS 2.56     RPE 15     Perceived Dyspnea  11     VO2 Peak 8.96     Symptoms No     Resting HR 55 bpm     Resting BP 102/70     Resting Oxygen Saturation  93 %     Exercise Oxygen Saturation  during 6 min walk 93 %     Max Ex. HR 74 bpm     Max Ex. BP 138/70     2 Minute Post BP 120/68       Interval HR   1 Minute HR 62     2 Minute HR 64     3 Minute HR 65     4 Minute HR 70     5 Minute HR 70     6 Minute HR 74     2 Minute Post HR 61     Interval Heart Rate? Yes       Interval Oxygen   Interval Oxygen? Yes     Baseline Oxygen Saturation % 93 %     1 Minute Oxygen Saturation % 93 %     1 Minute Liters of Oxygen 0 L     2 Minute Oxygen Saturation % 93 %     2 Minute Liters of Oxygen 0 L     3 Minute Oxygen Saturation % 93 %     3 Minute Liters of Oxygen 0 L     4 Minute Oxygen Saturation % 93 %     4 Minute Liters of Oxygen 0 L     5 Minute Oxygen Saturation % 93 %     5 Minute Liters of Oxygen 0 L     6 Minute Oxygen Saturation % 93 %     6 Minute Liters of Oxygen 0 L     2 Minute Post Oxygen Saturation % 95 %     2 Minute Post Liters of Oxygen 0 L               Oxygen Initial Assessment:  Oxygen Initial Assessment - 07/01/22 0946       Home Oxygen   Home Oxygen Device None    Sleep Oxygen Prescription None    Home Exercise Oxygen Prescription None    Home Resting Oxygen Prescription None    Compliance with Home Oxygen Use Yes      Initial 6 min Walk   Oxygen Used None      Program Oxygen Prescription   Program Oxygen Prescription None      Intervention   Short Term Goals To learn and understand importance of monitoring SPO2 with pulse oximeter and demonstrate accurate use of the pulse oximeter.;To learn and understand importance of maintaining oxygen saturations>88%;To learn and demonstrate proper pursed lip breathing techniques or other breathing techniques.     Long  Term Goals Verbalizes importance of monitoring SPO2 with pulse oximeter and return demonstration;Maintenance of O2 saturations>88%;Exhibits proper breathing techniques, such as pursed lip breathing or other method taught during program session;Compliance with respiratory medication             Oxygen Re-Evaluation:  Oxygen Re-Evaluation     Row Name 07/08/22 1253 08/05/22 1152 09/02/22 1256 09/30/22 0920 10/28/22 1311     Program Oxygen Prescription   Program Oxygen Prescription _0      Home Oxygen   Home Oxygen Device _1    Sleep Oxygen Prescription _2    Home Exercise Oxygen Prescription _3    Home Resting Oxygen Prescription _4    Compliance with Home Oxygen Use _5      Goals/Expected Outcomes   Short Term Goals To learn and understand importance of monitoring SPO2 with pulse oximeter and demonstrate accurate use of the pulse oximeter.;To learn and understand importance of maintaining oxygen saturations>88%;To learn and demonstrate proper pursed lip breathing techniques or other breathing techniques.  To learn and understand importance of  monitoring SPO2 with pulse oximeter and demonstrate accurate use of the pulse oximeter.;To learn and understand importance of maintaining oxygen saturations>88%;To learn and demonstrate proper pursed lip breathing techniques or other breathing techniques.  To learn and understand importance of monitoring SPO2 with pulse oximeter and demonstrate accurate use of the pulse oximeter.;To learn and understand importance of maintaining oxygen saturations>88%;To learn and demonstrate proper pursed lip breathing techniques or other breathing techniques.  To learn and understand importance of monitoring SPO2 with pulse oximeter and demonstrate accurate use of the pulse oximeter.;To learn and understand importance of maintaining oxygen saturations>88%;To learn and demonstrate proper pursed lip breathing techniques or other breathing techniques.  To learn and understand importance of monitoring SPO2 with pulse oximeter and demonstrate accurate use of the pulse oximeter.;To learn and understand importance of maintaining oxygen saturations>88%;To learn and demonstrate proper pursed lip breathing techniques or other breathing techniques.    Long  Term Goals Verbalizes importance of monitoring SPO2 with pulse oximeter and return demonstration;Maintenance of O2 saturations>88%;Exhibits proper breathing techniques, such as pursed lip breathing or other method taught during program session;Compliance with respiratory medication Verbalizes importance of monitoring SPO2 with pulse oximeter and return demonstration;Maintenance of O2 saturations>88%;Exhibits proper breathing techniques, such as pursed lip breathing or other method taught during program session;Compliance with respiratory medication Verbalizes importance of monitoring SPO2 with pulse oximeter and return demonstration;Maintenance of O2 saturations>88%;Exhibits proper breathing techniques, such as pursed lip breathing or other method taught during program session;Compliance  with respiratory medication Verbalizes importance of monitoring SPO2 with pulse oximeter and return demonstration;Maintenance of O2 saturations>88%;Exhibits proper breathing techniques, such as pursed lip breathing or other method taught during program session;Compliance with respiratory medication Verbalizes importance of monitoring SPO2 with pulse oximeter and return demonstration;Maintenance of O2 saturations>88%;Exhibits proper breathing techniques, such as pursed lip breathing or other method taught during program session;Compliance with respiratory medication   Goals/Expected Outcomes _6             Oxygen Discharge (Final Oxygen Re-Evaluation):  Oxygen Re-Evaluation - 10/28/22 1311       Program Oxygen Prescription   Program Oxygen Prescription None      Home Oxygen   Home Oxygen Device None    Sleep Oxygen Prescription None    Home Exercise Oxygen Prescription None  Home Resting Oxygen Prescription None    Compliance with Home Oxygen Use Yes      Goals/Expected Outcomes   Short Term Goals To learn and understand importance of monitoring SPO2 with pulse oximeter and demonstrate accurate use of the pulse oximeter.;To learn and understand importance of maintaining oxygen saturations>88%;To learn and demonstrate proper pursed lip breathing techniques or other breathing techniques.     Long  Term Goals Verbalizes importance of monitoring SPO2 with pulse oximeter and return demonstration;Maintenance of O2 saturations>88%;Exhibits proper breathing techniques, such as pursed lip breathing or other method taught during program session;Compliance with respiratory medication    Goals/Expected Outcomes Compliance             Initial Exercise Prescription:  Initial Exercise Prescription - 07/01/22 0900       Date of Initial Exercise RX and Referring Provider   Date 07/01/22    Referring Provider Dr. Melvyn Novas    Expected Discharge  Date 11/04/22      Recumbant Elliptical   Level 1    RPM 45    Minutes 39      Prescription Details   Frequency (times per week) 2    Duration Progress to 30 minutes of continuous aerobic without signs/symptoms of physical distress      Intensity   THRR 40-80% of Max Heartrate 60-121    Ratings of Perceived Exertion 11-13    Perceived Dyspnea 0-4      Resistance Training   Training Prescription Yes    Weight 4    Reps 10-15             Perform Capillary Blood Glucose checks as needed.  Exercise Prescription Changes:   Exercise Prescription Changes     Row Name 07/08/22 1200 07/22/22 1200 08/05/22 1100 08/21/22 1300 09/02/22 1200     Response to Exercise   Blood Pressure (Admit) 126/70 148/72 138/78 134/74 124/74   Blood Pressure (Exercise) 124/70 126/72 140/78 140/70 130/72   Blood Pressure (Exit) 108/68 110/72 106/64 108/60 120/70   Heart Rate (Admit) 60 bpm 58 bpm 63 bpm 55 bpm 59 bpm   Heart Rate (Exercise) 66 bpm 65 bpm 60 bpm 92 bpm 59 bpm   Heart Rate (Exit) 65 bpm 62 bpm 64 bpm 94 bpm 65 bpm   Oxygen Saturation (Admit) 92 % 93 % 96 % 96 % 91 %   Oxygen Saturation (Exercise) 93 % 93 % 92 % 92 % 92 %   Oxygen Saturation (Exit) 95 % 95 % 96 % 96 % 95 %   Rating of Perceived Exertion (Exercise) _0 Perceived Dyspnea (Exercise) _1 Duration Continue with 30 min of aerobic exercise without signs/symptoms of physical distress. Continue with 30 min of aerobic exercise without signs/symptoms of physical distress. Continue with 30 min of aerobic exercise without signs/symptoms of physical distress. Continue with 30 min of aerobic exercise without signs/symptoms of physical distress. Continue with 30 min of aerobic exercise without signs/symptoms of physical distress.   Intensity _2      Progression   Progression Continue to progress workloads to maintain intensity without  signs/symptoms of physical distress. Continue to progress workloads to maintain intensity without signs/symptoms of physical distress. Continue to progress workloads to maintain intensity without signs/symptoms of physical distress. Continue to progress workloads to maintain intensity without signs/symptoms of physical distress. Continue to progress workloads to maintain intensity  without signs/symptoms of physical distress.     Resistance Training   Training Prescription _0    Weight _1 Reps 10-15 10-15 10-15 10-15 10-15   Time 10 Minutes 10 Minutes 10 Minutes 10 Minutes 10 Minutes     Recumbant Elliptical   Level _2 RPM 55 45 40 42 32   Minutes 39 39 39 39 39   METs 3 3.1 2.2 2.6 1.7    Row Name 09/16/22 1200 09/25/22 1200 10/09/22 1200 10/28/22 1300       Response to Exercise   Blood Pressure (Admit) 104/64 110/58 128/76 116/72    Blood Pressure (Exercise) 116/62 120/58 152/70 126/74    Blood Pressure (Exit) 120/62 108/58 126/66 100/58    Heart Rate (Admit) 64 bpm 59 bpm 60 bpm 60 bpm    Heart Rate (Exercise) 69 bpm 67 bpm 67 bpm 68 bpm    Heart Rate (Exit) 65 bpm 66 bpm 62 bpm 60 bpm    Oxygen Saturation (Admit) 94 % 96 % 95 % 97 %    Oxygen Saturation (Exercise) 93 % 93 % 93 % 94 %    Oxygen Saturation (Exit) 95 % 97 % 5 % 96 %    Rating of Perceived Exertion (Exercise) _3 Perceived Dyspnea (Exercise) _4 Duration Continue with 30 min of aerobic exercise without signs/symptoms of physical distress. Continue with 30 min of aerobic exercise without signs/symptoms of physical distress. Continue with 30 min of aerobic exercise without signs/symptoms of physical distress. Continue with 30 min of aerobic exercise without signs/symptoms of physical distress.    Intensity THRR unchanged THRR unchanged THRR unchanged THRR unchanged      Progression   Progression Continue to progress workloads to maintain intensity without  signs/symptoms of physical distress. Continue to progress workloads to maintain intensity without signs/symptoms of physical distress. Continue to progress workloads to maintain intensity without signs/symptoms of physical distress. Continue to progress workloads to maintain intensity without signs/symptoms of physical distress.      Resistance Training   Training Prescription Yes Yes Yes Yes    Weight _5 Reps 10-15 10-15 10-15 10-15    Time 10 Minutes 10 Minutes 10 Minutes 10 Minutes      Recumbant Elliptical   Level _6 RPM 52 54 47 36    Minutes 39 39 39 39    METs 2.8 3.6 3 2.1             Exercise Comments:   Exercise Goals and Review:   Exercise Goals     Row Name 07/01/22 1610 07/08/22 1250 08/05/22 1148 09/02/22 1251 09/30/22 0917     Exercise Goals   Increase Physical Activity _7    Intervention Provide advice, education, support and counseling about physical activity/exercise needs.;Develop an individualized exercise prescription for aerobic and resistive training based on initial evaluation findings, risk stratification, comorbidities and participant's personal goals. Provide advice, education, support and counseling about physical activity/exercise needs.;Develop an individualized exercise prescription for aerobic and resistive training based on initial evaluation findings, risk stratification, comorbidities and participant's personal goals. Provide advice, education, support and counseling about physical activity/exercise needs.;Develop an individualized exercise prescription for aerobic and resistive training based on initial evaluation findings, risk stratification, comorbidities and participant's personal goals. Provide  advice, education, support and counseling about physical activity/exercise needs.;Develop an individualized exercise prescription for aerobic and resistive training based on initial evaluation findings, risk stratification,  comorbidities and participant's personal goals. Provide advice, education, support and counseling about physical activity/exercise needs.;Develop an individualized exercise prescription for aerobic and resistive training based on initial evaluation findings, risk stratification, comorbidities and participant's personal goals.   Expected Outcomes Short Term: Attend rehab on a regular basis to increase amount of physical activity.;Long Term: Add in home exercise to make exercise part of routine and to increase amount of physical activity.;Long Term: Exercising regularly at least 3-5 days a week. Short Term: Attend rehab on a regular basis to increase amount of physical activity.;Long Term: Add in home exercise to make exercise part of routine and to increase amount of physical activity.;Long Term: Exercising regularly at least 3-5 days a week. Short Term: Attend rehab on a regular basis to increase amount of physical activity.;Long Term: Add in home exercise to make exercise part of routine and to increase amount of physical activity.;Long Term: Exercising regularly at least 3-5 days a week. Short Term: Attend rehab on a regular basis to increase amount of physical activity.;Long Term: Add in home exercise to make exercise part of routine and to increase amount of physical activity.;Long Term: Exercising regularly at least 3-5 days a week. Short Term: Attend rehab on a regular basis to increase amount of physical activity.;Long Term: Add in home exercise to make exercise part of routine and to increase amount of physical activity.;Long Term: Exercising regularly at least 3-5 days a week.   Increase Strength and Stamina _0    Intervention Provide advice, education, support and counseling about physical activity/exercise needs.;Develop an individualized exercise prescription for aerobic and resistive training based on initial evaluation findings, risk stratification, comorbidities and participant's  personal goals. Provide advice, education, support and counseling about physical activity/exercise needs.;Develop an individualized exercise prescription for aerobic and resistive training based on initial evaluation findings, risk stratification, comorbidities and participant's personal goals. Provide advice, education, support and counseling about physical activity/exercise needs.;Develop an individualized exercise prescription for aerobic and resistive training based on initial evaluation findings, risk stratification, comorbidities and participant's personal goals. Provide advice, education, support and counseling about physical activity/exercise needs.;Develop an individualized exercise prescription for aerobic and resistive training based on initial evaluation findings, risk stratification, comorbidities and participant's personal goals. Provide advice, education, support and counseling about physical activity/exercise needs.;Develop an individualized exercise prescription for aerobic and resistive training based on initial evaluation findings, risk stratification, comorbidities and participant's personal goals.   Expected Outcomes Short Term: Increase workloads from initial exercise prescription for resistance, speed, and METs.;Short Term: Perform resistance training exercises routinely during rehab and add in resistance training at home;Long Term: Improve cardiorespiratory fitness, muscular endurance and strength as measured by increased METs and functional capacity (6MWT) Short Term: Increase workloads from initial exercise prescription for resistance, speed, and METs.;Short Term: Perform resistance training exercises routinely during rehab and add in resistance training at home;Long Term: Improve cardiorespiratory fitness, muscular endurance and strength as measured by increased METs and functional capacity (6MWT) Short Term: Increase workloads from initial exercise prescription for resistance, speed, and  METs.;Short Term: Perform resistance training exercises routinely during rehab and add in resistance training at home;Long Term: Improve cardiorespiratory fitness, muscular endurance and strength as measured by increased METs and functional capacity (6MWT) Short Term: Increase workloads from initial exercise prescription for resistance, speed, and METs.;Short Term: Perform resistance training exercises routinely  during rehab and add in resistance training at home;Long Term: Improve cardiorespiratory fitness, muscular endurance and strength as measured by increased METs and functional capacity (6MWT) Short Term: Increase workloads from initial exercise prescription for resistance, speed, and METs.;Short Term: Perform resistance training exercises routinely during rehab and add in resistance training at home;Long Term: Improve cardiorespiratory fitness, muscular endurance and strength as measured by increased METs and functional capacity (6MWT)   Able to understand and use rate of perceived exertion (RPE) scale _0    Intervention Provide education and explanation on how to use RPE scale Provide education and explanation on how to use RPE scale Provide education and explanation on how to use RPE scale Provide education and explanation on how to use RPE scale Provide education and explanation on how to use RPE scale   Expected Outcomes Short Term: Able to use RPE daily in rehab to express subjective intensity level;Long Term:  Able to use RPE to guide intensity level when exercising independently Short Term: Able to use RPE daily in rehab to express subjective intensity level;Long Term:  Able to use RPE to guide intensity level when exercising independently Short Term: Able to use RPE daily in rehab to express subjective intensity level;Long Term:  Able to use RPE to guide intensity level when exercising independently Short Term: Able to use RPE daily in rehab to express subjective intensity  level;Long Term:  Able to use RPE to guide intensity level when exercising independently Short Term: Able to use RPE daily in rehab to express subjective intensity level;Long Term:  Able to use RPE to guide intensity level when exercising independently   Able to understand and use Dyspnea scale _1    Intervention Provide education and explanation on how to use Dyspnea scale Provide education and explanation on how to use Dyspnea scale Provide education and explanation on how to use Dyspnea scale Provide education and explanation on how to use Dyspnea scale Provide education and explanation on how to use Dyspnea scale   Expected Outcomes Short Term: Able to use Dyspnea scale daily in rehab to express subjective sense of shortness of breath during exertion;Long Term: Able to use Dyspnea scale to guide intensity level when exercising independently Short Term: Able to use Dyspnea scale daily in rehab to express subjective sense of shortness of breath during exertion;Long Term: Able to use Dyspnea scale to guide intensity level when exercising independently Short Term: Able to use Dyspnea scale daily in rehab to express subjective sense of shortness of breath during exertion;Long Term: Able to use Dyspnea scale to guide intensity level when exercising independently Short Term: Able to use Dyspnea scale daily in rehab to express subjective sense of shortness of breath during exertion;Long Term: Able to use Dyspnea scale to guide intensity level when exercising independently Short Term: Able to use Dyspnea scale daily in rehab to express subjective sense of shortness of breath during exertion;Long Term: Able to use Dyspnea scale to guide intensity level when exercising independently   Knowledge and understanding of Target Heart Rate Range (THRR) Yes -- Yes Yes Yes   Intervention Provide education and explanation of THRR including how the numbers were predicted and where they are located for reference  Provide education and explanation of THRR including how the numbers were predicted and where they are located for reference Provide education and explanation of THRR including how the numbers were predicted and where they are located for reference Provide education and explanation of  THRR including how the numbers were predicted and where they are located for reference Provide education and explanation of THRR including how the numbers were predicted and where they are located for reference   Expected Outcomes Short Term: Able to state/look up THRR;Short Term: Able to use daily as guideline for intensity in rehab;Long Term: Able to use THRR to govern intensity when exercising independently Short Term: Able to state/look up THRR;Short Term: Able to use daily as guideline for intensity in rehab;Long Term: Able to use THRR to govern intensity when exercising independently Short Term: Able to state/look up THRR;Short Term: Able to use daily as guideline for intensity in rehab;Long Term: Able to use THRR to govern intensity when exercising independently Short Term: Able to state/look up THRR;Short Term: Able to use daily as guideline for intensity in rehab;Long Term: Able to use THRR to govern intensity when exercising independently Short Term: Able to state/look up THRR;Short Term: Able to use daily as guideline for intensity in rehab;Long Term: Able to use THRR to govern intensity when exercising independently   Understanding of Exercise Prescription _0    Intervention Provide education, explanation, and written materials on patient's individual exercise prescription Provide education, explanation, and written materials on patient's individual exercise prescription Provide education, explanation, and written materials on patient's individual exercise prescription Provide education, explanation, and written materials on patient's individual exercise prescription Provide education, explanation, and  written materials on patient's individual exercise prescription   Expected Outcomes Short Term: Able to explain program exercise prescription;Long Term: Able to explain home exercise prescription to exercise independently Short Term: Able to explain program exercise prescription;Long Term: Able to explain home exercise prescription to exercise independently Short Term: Able to explain program exercise prescription;Long Term: Able to explain home exercise prescription to exercise independently Short Term: Able to explain program exercise prescription;Long Term: Able to explain home exercise prescription to exercise independently Short Term: Able to explain program exercise prescription;Long Term: Able to explain home exercise prescription to exercise independently    [place] Name 10/28/22 1305             Exercise Goals   Increase Physical Activity Yes       Intervention Provide advice, education, support and counseling about physical activity/exercise needs.;Develop an individualized exercise prescription for aerobic and resistive training based on initial evaluation findings, risk stratification, comorbidities and participant's personal goals.       Expected Outcomes Short Term: Attend rehab on a regular basis to increase amount of physical activity.;Long Term: Add in home exercise to make exercise part of routine and to increase amount of physical activity.;Long Term: Exercising regularly at least 3-5 days a week.       Increase Strength and Stamina Yes       Intervention Provide advice, education, support and counseling about physical activity/exercise needs.;Develop an individualized exercise prescription for aerobic and resistive training based on initial evaluation findings, risk stratification, comorbidities and participant's personal goals.       Expected Outcomes Short Term: Increase workloads from initial exercise prescription for resistance, speed, and METs.;Short Term: Perform resistance  training exercises routinely during rehab and add in resistance training at home;Long Term: Improve cardiorespiratory fitness, muscular endurance and strength as measured by increased METs and functional capacity (6MWT)       Able to understand and use rate of perceived exertion (RPE) scale Yes       Intervention Provide education and explanation on how to use RPE scale  Expected Outcomes Short Term: Able to use RPE daily in rehab to express subjective intensity level;Long Term:  Able to use RPE to guide intensity level when exercising independently       Able to understand and use Dyspnea scale Yes       Intervention Provide education and explanation on how to use Dyspnea scale       Expected Outcomes Short Term: Able to use Dyspnea scale daily in rehab to express subjective sense of shortness of breath during exertion;Long Term: Able to use Dyspnea scale to guide intensity level when exercising independently       Knowledge and understanding of Target Heart Rate Range (THRR) Yes       Intervention Provide education and explanation of THRR including how the numbers were predicted and where they are located for reference       Expected Outcomes Short Term: Able to state/look up THRR;Short Term: Able to use daily as guideline for intensity in rehab;Long Term: Able to use THRR to govern intensity when exercising independently       Understanding of Exercise Prescription Yes       Intervention Provide education, explanation, and written materials on patient's individual exercise prescription       Expected Outcomes Short Term: Able to explain program exercise prescription;Long Term: Able to explain home exercise prescription to exercise independently                Exercise Goals Re-Evaluation :  Exercise Goals Re-Evaluation     [place] Name 07/08/22 1251 08/05/22 1148 09/02/22 1251 09/30/22 0918 10/28/22 1306     Exercise Goal Re-Evaluation   Exercise Goals Review Increase Physical  Activity;Increase Strength and Stamina;Able to understand and use rate of perceived exertion (RPE) scale;Able to understand and use Dyspnea scale;Able to check pulse independently;Understanding of Exercise Prescription Increase Physical Activity;Increase Strength and Stamina;Able to understand and use rate of perceived exertion (RPE) scale;Able to understand and use Dyspnea scale;Knowledge and understanding of Target Heart Rate Range (THRR);Understanding of Exercise Prescription Increase Physical Activity;Increase Strength and Stamina;Able to understand and use rate of perceived exertion (RPE) scale;Able to understand and use Dyspnea scale;Knowledge and understanding of Target Heart Rate Range (THRR);Understanding of Exercise Prescription Increase Physical Activity;Increase Strength and Stamina;Able to understand and use rate of perceived exertion (RPE) scale;Able to understand and use Dyspnea scale;Knowledge and understanding of Target Heart Rate Range (THRR);Understanding of Exercise Prescription Increase Physical Activity;Increase Strength and Stamina;Able to understand and use rate of perceived exertion (RPE) scale;Able to understand and use Dyspnea scale;Knowledge and understanding of Target Heart Rate Range (THRR);Understanding of Exercise Prescription   Comments Pt has completed 2 sessions of PR. He is motivated during class and focused on increasing his distance on each class. He is currently exercising at 3.0 METs on the ellp. Will continue to monitor and progress as able. Pt has completed 9 sessions of PR. He is focued on increasing his level and distance for each class. He is currently exercising at 2.9 METs on the ellp. Pt also has leg pain when first increasing his levels, he stated he is to see a vein specialist soon. Will monitor and progres as able. Pt has completed 17 sessions of PR. He is focused on increasing his distance each class and has increased his level. He is currently exercising at 1.7  METs on the ellp. Will monitor and progress as able. Pt has completed 22 sessions of PR. He continues to push himself and increase his distance. He  is increasing his workloads. He is currently exercising at 3.6 METs on the ellp. Will continue to monitor and progress as able. Pt has completed 27 sessions of PR. He has not increased his workload lately. He is focused on increasing his distance on the ellp then his level. He is planning on joining the [facility] program after he graduates PR. He is currently exercising at 2.1 METs on  the ellp. Will continue to monitor and progress as able.   Expected Outcomes Through exercise at rehab and at home, the patient will meet their stated goals. Through exercise at rehab and at home, the patient will meet their stated goals. Through exercise at rehab and at home, the patient will meet their stated goals. Through exercise at rehab and at home, the patient will meet their stated goals. Through exercise at rehab and at home, the patient will meet their stated goals.            Discharge Exercise Prescription (Final Exercise Prescription Changes):  Exercise Prescription Changes - 10/28/22 1300       Response to Exercise   Blood Pressure (Admit) 116/72    Blood Pressure (Exercise) 126/74    Blood Pressure (Exit) 100/58    Heart Rate (Admit) 60 bpm    Heart Rate (Exercise) 68 bpm    Heart Rate (Exit) 60 bpm    Oxygen Saturation (Admit) 97 %    Oxygen Saturation (Exercise) 94 %    Oxygen Saturation (Exit) 96 %    Rating of Perceived Exertion (Exercise) 11    Perceived Dyspnea (Exercise) 11    Duration Continue with 30 min of aerobic exercise without signs/symptoms of physical distress.    Intensity THRR unchanged      Progression   Progression Continue to progress workloads to maintain intensity without signs/symptoms of physical distress.      Resistance Training   Training Prescription Yes    Weight 5    Reps 10-15    Time 10 Minutes       Recumbant Elliptical   Level 4    RPM 36    Minutes 39    METs 2.1             Nutrition:  Target Goals: Understanding of nutrition guidelines, daily intake of sodium <159m, cholesterol <2065m calories 30% from fat and 7% or less from saturated fats, daily to have 5 or more servings of fruits and vegetables.  Biometrics:  Pre Biometrics - 07/01/22 0953       Pre Biometrics   Height _0  (1.854 m)    Weight 99.5 kg    Waist Circumference 44 inches    Hip Circumference 43 inches    Waist to Hip Ratio 1.02 %    BMI (Calculated) 28.95    Triceps Skinfold 20 mm    % Body Fat 30.6 %    Grip Strength 45 kg    Flexibility 0 in    Single Leg Stand 4.35 seconds              Nutrition Therapy Plan and Nutrition Goals:  Nutrition Therapy & Goals - 07/02/22 0848       Personal Nutrition Goals   Comments Patient scored 57 on his diet assessment. We offer 2 educational sessions on heart healthy nutrition with handouts.      Intervention Plan   Intervention Nutrition handout(s) given to patient.    Expected Outcomes Short Term Goal: Understand basic principles of dietary content, such  as calories, fat, sodium, cholesterol and nutrients.             Nutrition Assessments:  Nutrition Assessments - 07/01/22 0902       MEDFICTS Scores   Pre Score 57            MEDIFICTS Score Key: ?70 Need to make dietary changes  40-70 Heart Healthy Diet ? 40 Therapeutic Level Cholesterol Diet   Picture Your Plate Scores: <91 Unhealthy dietary pattern with much room for improvement. 41-50 Dietary pattern unlikely to meet recommendations for good health and room for improvement. 51-60 More healthful dietary pattern, with some room for improvement.  >60 Healthy dietary pattern, although there may be some specific behaviors that could be improved.    Nutrition Goals Re-Evaluation:   Nutrition Goals Discharge (Final Nutrition Goals  Re-Evaluation):   Psychosocial: Target Goals: Acknowledge presence or absence of significant depression and/or stress, maximize coping skills, provide positive support system. Participant is able to verbalize types and ability to use techniques and skills needed for reducing stress and depression.  Initial Review & Psychosocial Screening:  Initial Psych Review & Screening - 07/01/22 0859       Initial Review   Current issues with Current Sleep Concerns;Current Anxiety/Panic      Family Dynamics   Good Support System? Yes    Comments His support system includes his wife, sons, and grand children.      Barriers   Psychosocial barriers to participate in program There are no identifiable barriers or psychosocial needs.      Screening Interventions   Interventions Encouraged to exercise    Expected Outcomes Long Term goal: The participant improves quality of Life and PHQ9 Scores as seen by post scores and/or verbalization of changes;Short Term goal: Identification and review with participant of any Quality of Life or Depression concerns found by scoring the questionnaire.             Quality of Life Scores:  Quality of Life - 07/01/22 0954       Quality of Life   Select Quality of Life      Quality of Life Scores   Health/Function Pre 17.13 %    Socioeconomic Pre 21.75 %    Psych/Spiritual Pre 19.93 %    Family Pre 26.4 %    GLOBAL Pre 19.97 %            Scores of 19 and below usually indicate a poorer quality of life in these areas.  A difference of  2-3 points is a clinically meaningful difference.  A difference of 2-3 points in the total score of the Quality of Life Index has been associated with significant improvement in overall quality of life, self-image, physical symptoms, and general health in studies assessing change in quality of life.   PHQ-9: Review Flowsheet  More data exists      07/01/2022 06/09/2022 04/02/2022 03/24/2022 03/13/2022  Depression screen PHQ  2/9  Decreased Interest 3 0 0 0 0  Down, Depressed, Hopeless 1 0 0 0 0  PHQ - 2 Score 4 0 0 0 0  Altered sleeping 3 - - - -  Tired, decreased energy 3 - - - -  Change in appetite 1 - - - -  Feeling bad or failure about yourself  1 - - - -  Trouble concentrating 1 - - - -  Moving slowly or fidgety/restless 3 - - - -  Suicidal thoughts 0 - - - -  PHQ-9 Score 16 - - - -  Difficult doing work/chores Very difficult - - - -   Interpretation of Total Score  Total Score Depression Severity:  1-4 = Minimal depression, 5-9 = Mild depression, 10-14 = Moderate depression, 15-19 = Moderately severe depression, 20-27 = Severe depression   Psychosocial Evaluation and Intervention:  Psychosocial Evaluation - 07/01/22 0937       Psychosocial Evaluation & Interventions   Interventions Encouraged to exercise with the program and follow exercise prescription    Comments Pt has to participate in PR. He does have current anxiety and sleep concerns. He reports that he takes xanax PRN on days when he is "really on edge", but this is only about once per week. He states that a months supply of xanax lasts him 6-8 months. He does take ambien for his sleep, and he reports that this helps some, but he is unable to stay asleep for more than 2 hours at a time. 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 He scored a 16 on his PHQ-9, and he relates most of this to his peripheral vascular disease in his legs and his lack of sleep. He is no longer able to walk long distances due to his leg pains and fatigue. He denies have depression or any thoughs of harming himself. He reports that he has a good support system with his wife, 3 sons, and 10 grandchildren. His goals while in the program are to improve his leg strength and stamina, so that he can be able to walk longer distances again. He has participated in the PR and CR programs before  and reported that he benefited from them, so he is eager to start the program again.    Expected Outcomes Pt's anxiety and sleep concerns will continue to be treated, and he will have no other identifiable psychosocial issues.    Continue Psychosocial Services  No Follow up required             Psychosocial Re-Evaluation:  Psychosocial Re-Evaluation     [place] Name 07/02/22 [phone] 07/30/22 1404 08/26/22 0838 09/23/22 1006 10/21/22 0934     Psychosocial Re-Evaluation   Current issues with Current Anxiety/Panic;Current Sleep Concerns Current Anxiety/Panic;Current Sleep Concerns Current Anxiety/Panic;Current Sleep Concerns Current Anxiety/Panic;Current Sleep Concerns Current Anxiety/Panic;Current Sleep Concerns (P)    Comments Patient is new to the program. He plans to start tomorrow 8/10. We will continue to monitor his progress in the program. Patient has cpmpleted 8 sessions.  Reports having great support from family.  We will continue to monitor his progress in the program. Patient has completed 15 sessions.  Great support from wife.  Pt seems to be enjoying the program and is progression well.  Plan of care is ongoing.  No concerns as of present. Patient has completed 21 sessions.  Supported by wife.  Pt seems to be enjoying the program and is progression well.  Plan of care is ongoing.  No concerns as of present. Patient has completed 26 sessions.  Supported very well by wife.  Seems to be enjoying the program and is progression well.  Plan of care is ongoing.  No concerns as of present. (P)    Expected Outcomes Patient will continue to have no psychosocial barriers and his anxiety and sleep will continue to be managed with Alprazolam and Ambien prn. Patient will continue to have no psychosocial barriers and  his anxiety and sleep will continue to be managed with Alprazolam and Ambien prn. Patient will continue to have no psychosocial barriers.  Patients anxiety and sleep issues will continue to be  managed with Alprazolam and Ambien prn. Patient will continue to have no psychosocial barriers.  Patients anxiety and sleep issues will continue to be managed with Alprazolam and Ambien prn. Patient will continue to have no psychosocial barriers.  Patients anxiety and sleep issues will continue to be managed with Alprazolam and Ambien prn. (P)    Interventions Encouraged to attend Pulmonary Rehabilitation for the exercise;Relaxation education;Stress management education Encouraged to attend Pulmonary Rehabilitation for the exercise;Relaxation education;Stress management education Encouraged to attend Pulmonary Rehabilitation for the exercise;Relaxation education;Stress management education Encouraged to attend Pulmonary Rehabilitation for the exercise;Relaxation education;Stress management education --   Continue Psychosocial Services  No Follow up required No Follow up required No Follow up required No Follow up required --    [place] Name 10/21/22 1055             Psychosocial Re-Evaluation   Current issues with Current Anxiety/Panic;Current Sleep Concerns       Comments Patient has completed 26 sessions.  Supported by wife.  Pt seems to be enjoying the program and is progression well.  Plan of care is ongoing.  No concerns as of present.       Expected Outcomes Patient will continue to have no psychosocial barriers.  Patients anxiety and sleep issues will continue to be managed with Alprazolam and Ambien prn.       Interventions Encouraged to attend Pulmonary Rehabilitation for the exercise;Relaxation education;Stress management education       Continue Psychosocial Services  No Follow up required                Psychosocial Discharge (Final Psychosocial Re-Evaluation):  Psychosocial Re-Evaluation - 10/21/22 1055       Psychosocial Re-Evaluation   Current issues with Current Anxiety/Panic;Current Sleep Concerns    Comments Patient has completed 26 sessions.  Supported by wife.  Pt seems  to be enjoying the program and is progression well.  Plan of care is ongoing.  No concerns as of present.    Expected Outcomes Patient will continue to have no psychosocial barriers.  Patients anxiety and sleep issues will continue to be managed with Alprazolam and Ambien prn.    Interventions Encouraged to attend Pulmonary Rehabilitation for the exercise;Relaxation education;Stress management education    Continue Psychosocial Services  No Follow up required              Education: Education Goals: Education classes will be provided on a weekly basis, covering required topics. Participant will state understanding/return demonstration of topics presented.  Learning Barriers/Preferences:  Learning Barriers/Preferences - 07/01/22 2353       Learning Barriers/Preferences   Learning Barriers None    Learning Preferences Skilled Demonstration;Individual Instruction             Education Topics: How Lungs Work and Diseases: - Discuss the anatomy of the lungs and diseases that can affect the lungs, such as COPD. Flowsheet Row PULMONARY REHAB CHRONIC OBSTRUCTIVE PULMONARY DISEASE from 10/23/2022 in [place]  Date 08/28/22  Educator DF  Instruction Review Code 1- Verbalizes Understanding       Exercise: -Discuss the importance of exercise, FITT principles of exercise, normal and abnormal responses to exercise, and how to exercise safely.   Environmental Irritants: -Discuss types of environmental irritants and how to limit  exposure to environmental irritants. Flowsheet Row PULMONARY REHAB CHRONIC OBSTRUCTIVE PULMONARY DISEASE from 10/23/2022 in [place]  Date 09/04/22  Educator [place]  Instruction Review Code 1- Verbalizes Understanding       Meds/Inhalers and oxygen: - Discuss respiratory medications, definition of an inhaler and oxygen, and the proper way to use an inhaler and oxygen.   Energy Saving Techniques: -  Discuss methods to conserve energy and decrease shortness of breath when performing activities of daily living.  Flowsheet Row PULMONARY REHAB CHRONIC OBSTRUCTIVE PULMONARY DISEASE from 10/23/2022 in [place]  Date 09/18/22  Educator Handout  Instruction Review Code 1- Verbalizes Understanding       Bronchial Hygiene / Breathing Techniques: - Discuss breathing mechanics, pursed-lip breathing technique,  proper posture, effective ways to clear airways, and other functional breathing techniques Flowsheet Row PULMONARY REHAB CHRONIC OBSTRUCTIVE PULMONARY DISEASE from 10/23/2022 in [place]  Date 09/25/22  Educator HB  Instruction Review Code 1- Research scientist (medical): - Provides group verbal and written instruction about the health risks of elevated stress, cause of high stress, and healthy ways to reduce stress. Flowsheet Row PULMONARY REHAB CHRONIC OBSTRUCTIVE PULMONARY DISEASE from 10/23/2022 in [place]  Date 07/03/22  Educator DF  Instruction Review Code 2- Demonstrated Understanding       Nutrition I: Fats: - Discuss the types of cholesterol, what cholesterol does to the body, and how cholesterol levels can be controlled. Flowsheet Row PULMONARY REHAB CHRONIC OBSTRUCTIVE PULMONARY DISEASE from 10/23/2022 in [place]  Date 07/10/22  Educator Handout  Instruction Review Code 1- Verbalizes Understanding       Nutrition II: Labels: -Discuss the different components of food labels and how to read food labels. Flowsheet Row PULMONARY REHAB CHRONIC OBSTRUCTIVE PULMONARY DISEASE from 10/23/2022 in [place]  Date 10/23/22  Educator HB  Instruction Review Code 1- Verbalizes Understanding       Respiratory Infections: - Discuss the signs and symptoms of respiratory infections, ways to prevent respiratory infections, and the  importance of seeking medical treatment when having a respiratory infection. Flowsheet Row PULMONARY REHAB CHRONIC OBSTRUCTIVE PULMONARY DISEASE from 10/23/2022 in [place]  Date 07/31/22  Educator Handout  Instruction Review Code 1- Verbalizes Understanding       Stress I: Signs and Symptoms: - Discuss the causes of stress, how stress may lead to anxiety and depression, and ways to limit stress. Flowsheet Row PULMONARY REHAB CHRONIC OBSTRUCTIVE PULMONARY DISEASE from 10/23/2022 in [place]  Date 08/07/22  Educator DF  Instruction Review Code 2- Demonstrated Understanding       Stress II: Relaxation: -Discuss relaxation techniques to limit stress. Flowsheet Row PULMONARY REHAB CHRONIC OBSTRUCTIVE PULMONARY DISEASE from 10/23/2022 in [place]  Date 08/14/22  Educator DM  Instruction Review Code 1- Verbalizes Understanding       Oxygen for Home/Travel: - Discuss how to prepare for travel when on oxygen and proper ways to transport and store oxygen to ensure safety. Flowsheet Row PULMONARY REHAB CHRONIC OBSTRUCTIVE PULMONARY DISEASE from 10/23/2022 in [place]  Date 08/21/22  Educator DF  Instruction Review Code 2- Demonstrated Understanding       Knowledge Questionnaire Score:  Knowledge Questionnaire Score - 07/01/22 0905       Knowledge Questionnaire Score   Pre Score 15/18  Core Components/Risk Factors/Patient Goals at Admission:  Personal Goals and Risk Factors at Admission - 07/01/22 0908       Core Components/Risk Factors/Patient Goals on Admission   Improve shortness of breath with ADL's Yes    Intervention Provide education, individualized exercise plan and daily activity instruction to help decrease symptoms of SOB with activities of daily living.    Expected Outcomes Short Term: Improve cardiorespiratory fitness to achieve a reduction of  symptoms when performing ADLs;Long Term: Be able to perform more ADLs without symptoms or delay the onset of symptoms    Increase knowledge of respiratory medications and ability to use respiratory devices properly  Yes    Intervention Provide education and demonstration as needed of appropriate use of medications, inhalers, and oxygen therapy.    Expected Outcomes Short Term: Achieves understanding of medications use. Understands that oxygen is a medication prescribed by physician. Demonstrates appropriate use of inhaler and oxygen therapy.;Long Term: Maintain appropriate use of medications, inhalers, and oxygen therapy.    Personal Goal Other Yes    Personal Goal Improve leg strength and stamina.    Intervention Attend PR two days per week and begin a home exercise plan.    Expected Outcomes Pt will reach stated goals.             Core Components/Risk Factors/Patient Goals Review:   Goals and Risk Factor Review     Row Name 07/02/22 0849 07/30/22 1410 08/26/22 0903 09/23/22 1013 10/21/22 1056     Core Components/Risk Factors/Patient Goals Review   Personal Goals Review Improve shortness of breath with ADL's;Other Improve shortness of breath with ADL's;Other Improve shortness of breath with ADL's;Other Improve shortness of breath with ADL's;Other Improve shortness of breath with ADL's;Other   Review Patient was referred to [facility] with COPD from [place]. He plans to strart the program tomorrow 07/03/22. His personal goals are to improve his strength and stamina in his legs. We will continue to monitor his progress as he works towards meeting these goals. Pt has completed 8 sessions.  He is progressing well.  VSS.  Sats are 91-96% on room air.  His personal goals are to improve his strength and stamina in his legs. We will continue to monitor his progress as he works towards meeting these goals. Pt has completed 15 sessions.  VSS.  Sats are 90-97% on room air.  His personal goals are to improve his  strength and stamina in his legs. We will continue to monitor his progress as he works towards meeting these goals. Pt has completed 21 sessions.  VSS.  Sats are 90-95% on room air.  His personal goals are to improve his strength and stamina in his legs. We will continue to monitor his progress as he works towards meeting these goals. Pt has completed 26 sessions.  VSS.  Sats are 93-97% on room air.  His personal goals are to improve his strength and stamina in his legs. We will continue to monitor his progress as he works towards meeting these goals.   Expected Outcomes Patient will complete the program meeting both personal and program goals. Patient will complete the program meeting both personal and program goals. Patient will complete the program meeting both personal and program goals. Patient will complete the program meeting both personal and program goals. Patient will complete the program meeting both personal and program goals.            Core Components/Risk Factors/Patient Goals at Discharge (Final Review):  Goals and Risk Factor Review - 10/21/22 1056       Core Components/Risk Factors/Patient Goals Review   Personal Goals Review Improve shortness of breath with ADL's;Other    Review Pt has completed 26 sessions.  VSS.  Sats are 93-97% on room air.  His personal goals are to improve his strength and stamina in his legs. We will continue to monitor his progress as he works towards meeting these goals.    Expected Outcomes Patient will complete the program meeting both personal and program goals.             ITP Comments:   Comments: ITP REVIEW Pt is making expected progress toward pulmonary rehab goals after completing 29 sessions. Recommend continued exercise, life style modification, education, and utilization of breathing techniques to increase stamina and strength and decrease shortness of breath with exertion.

## 2022-10-30 ENCOUNTER — Encounter (HOSPITAL_COMMUNITY)
Admission: RE | Admit: 2022-10-30 | Discharge: 2022-10-30 | Disposition: A | Discharge status: Home / Self Care | Payer: Medicare Other | Source: Ambulatory Visit | Attending: Internal Medicine | Admitting: Internal Medicine

## 2022-10-30 ENCOUNTER — Ambulatory Visit (INDEPENDENT_AMBULATORY_CARE_PROVIDER_SITE_OTHER): Payer: Medicare Other | Admitting: Internal Medicine

## 2022-10-30 ENCOUNTER — Encounter: Payer: Self-pay | Admitting: Internal Medicine

## 2022-10-30 VITALS — BP 119/65 | HR 60 | Ht 73.0 in | Wt 214.0 lb

## 2022-10-30 DIAGNOSIS — R21 Rash and other nonspecific skin eruption: Secondary | ICD-10-CM

## 2022-10-30 DIAGNOSIS — E785 Hyperlipidemia, unspecified: Secondary | ICD-10-CM | POA: Diagnosis not present

## 2022-10-30 DIAGNOSIS — G4700 Insomnia, unspecified: Secondary | ICD-10-CM | POA: Diagnosis not present

## 2022-10-30 DIAGNOSIS — I25118 Atherosclerotic heart disease of native coronary artery with other forms of angina pectoris: Secondary | ICD-10-CM | POA: Diagnosis not present

## 2022-10-30 DIAGNOSIS — J449 Chronic obstructive pulmonary disease, unspecified: Secondary | ICD-10-CM | POA: Diagnosis not present

## 2022-10-30 DIAGNOSIS — I739 Peripheral vascular disease, unspecified: Secondary | ICD-10-CM | POA: Diagnosis not present

## 2022-10-30 DIAGNOSIS — I10 Essential (primary) hypertension: Secondary | ICD-10-CM

## 2022-10-30 DIAGNOSIS — Z79899 Other long term (current) drug therapy: Secondary | ICD-10-CM | POA: Diagnosis not present

## 2022-10-30 DIAGNOSIS — Z2821 Immunization not carried out because of patient refusal: Secondary | ICD-10-CM | POA: Diagnosis not present

## 2022-10-30 DIAGNOSIS — J42 Unspecified chronic bronchitis: Secondary | ICD-10-CM

## 2022-10-30 DIAGNOSIS — N1831 Chronic kidney disease, stage 3a: Secondary | ICD-10-CM

## 2022-10-30 DIAGNOSIS — F419 Anxiety disorder, unspecified: Secondary | ICD-10-CM | POA: Diagnosis not present

## 2022-10-30 MED ORDER — ZOLPIDEM TARTRATE 10 MG PO TABS: 10.0000 mg | ORAL_TABLET | Freq: Every evening | ORAL | 3 refills | Status: DC | PRN

## 2022-10-30 MED ORDER — KETOCONAZOLE 2 % EX CREA: 1.0000 | TOPICAL_CREAM | Freq: Every day | CUTANEOUS | 0 refills | Status: DC

## 2022-10-30 NOTE — Patient Instructions (Signed)
Please continue taking medications as prescribed.  Please continue to follow low salt diet and perform moderate exercise/walking as tolerated. 

## 2022-10-30 NOTE — Assessment & Plan Note (Addendum)
BP Readings from Last 1 Encounters:  10/30/22 119/65   Well-controlled with Hyzaar, Metoprolol and Imdur Counseled for compliance with the medications Advised DASH diet and moderate exercise/walking, at least 150 mins/week

## 2022-10-30 NOTE — Progress Notes (Signed)
Daily Session Note  Patient Details  Name: Dennis [name] MRN: [id number] Date of Birth: 06/25/1951 Referring Provider:   Flowsheet Row PULMONARY REHAB COPD ORIENTATION from 07/01/2022 in [place]  Referring Provider Dr. Melvyn Novas       Encounter Date: 10/30/2022  Check In:  Session Check In - 10/30/22 1045       Check-In   Supervising physician immediately available to respond to emergencies [facility] MD immediately available    Physician(s) Dr Harl Bowie    Location [facility]    Staff Present Hoy Register MHA, MS, ACSM-CEP;Leana Roe, BS, Exercise Physiologist    Virtual Visit No    Medication changes reported     No    Fall or balance concerns reported    No    Tobacco Cessation No Change    Warm-up and Cool-down Performed as group-led instruction    Resistance Training Performed Yes    VAD Patient? No    PAD/SET Patient? No      Pain Assessment   Currently in Pain? No/denies    Pain Score 0-No pain    Multiple Pain Sites No             Capillary Blood Glucose: No results found for this or any previous visit (from the past 24 hour(s)).    Social History   Tobacco Use  Smoking Status Former   Packs/day: 2.00   Years: 27.00   Total pack years: 54.00   Types: Cigarettes   Start date: 02/01/1971   Quit date: 01/31/1998   Years since quitting: 24.7  Smokeless Tobacco Current   Types: Chew    Goals Met:  Proper associated with RPD/PD & O2 Sat [place] with exercise equipment Using PLB without cueing & demonstrates good technique Exercise tolerated well Queuing for purse lip breathing No report of concerns or symptoms today Strength training completed today  Goals Unmet:  Not Applicable  Comments: checkout time is 1145   Dr. Kathie Dike is Medical Director for [facility].

## 2022-10-30 NOTE — Assessment & Plan Note (Addendum)
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 Continue Aspirin and Vytorin Continue Metoprolol and Imdur Discussed recent nuclear study results and provided print out of the report.

## 2022-10-30 NOTE — Assessment & Plan Note (Signed)
Concern for candidal infection, advised to use Nystatin powder Ketoconazole cream prescribed

## 2022-10-30 NOTE — Assessment & Plan Note (Signed)
CMP reviewed S. Cr. 1.38 Will monitor for now On ARB Advised for proper hydration Avoid nephrotoxic agents

## 2022-10-30 NOTE — Progress Notes (Signed)
I have reviewed a Home Exercise Prescription with [name] . Dennis [name] is not currently exercising at home.  The patient was advised to walk 3 days a week for 30-45 minutes.  Demetrius and I discussed how to progress their exercise prescription.  The patient stated that their goals were to build up his strength in his legs. He is starting maintance in January to continue exercise.  The patient stated that they understand the exercise prescription.  We reviewed exercise guidelines, target heart rate during exercise, RPE Scale, weather conditions, NTG use, endpoints for exercise, warmup and cool down.  Patient is encouraged to come to me with any questions. I will continue to follow up with the patient to assist them with progression and safety.

## 2022-10-30 NOTE — Assessment & Plan Note (Signed)
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

## 2022-10-30 NOTE — Assessment & Plan Note (Signed)
Takes Xanax PRN, for panic episodes Associated with previous incident with his wife (cardiac arrest) Symptoms better now Takes Ambien 10 mg QD for insomnia

## 2022-10-30 NOTE — Assessment & Plan Note (Signed)
Used to follow up with lipid clinic Was on Praluent, had severe fatigue and rash with it -could not tolerate it Continue Vytorin for now Checked lipid profile

## 2022-10-30 NOTE — Assessment & Plan Note (Addendum)
Quit smoking in 1999 On Symbicort and Albuterol Advised to comply with Symbicort to avoid frequent exacerbations Undergoing pulmonary rehab program - paperwork signed for [facility] health forever fit program
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
## 2022-10-31 ENCOUNTER — Telehealth: Payer: Self-pay | Admitting: Internal Medicine

## 2022-10-31 LAB — BASIC METABOLIC PANEL
BUN/Creatinine Ratio: 15 (ref 10–24)
BUN: 27 mg/dL (ref 8–27)
CO2: 27 mmol/L (ref 20–29)
Calcium: 9.8 mg/dL (ref 8.6–10.2)
Chloride: 94 mmol/L — ABNORMAL LOW (ref 96–106)
Creatinine, Ser: 1.83 mg/dL — ABNORMAL HIGH (ref 0.76–1.27)
Glucose: 112 mg/dL — ABNORMAL HIGH (ref 70–99)
Potassium: 4.6 mmol/L (ref 3.5–5.2)
Sodium: 134 mmol/L (ref 134–144)
eGFR: 39 mL/min/{1.73_m2} — ABNORMAL LOW (ref >59)

## 2022-10-31 NOTE — Telephone Encounter (Signed)
Patient called in regard to messages he received about drinking 64oz of water a day.  Patient wants to know why, for how long and if it is in regard to liver

## 2022-11-03 NOTE — Telephone Encounter (Signed)
Patient advised.

## 2022-11-04 ENCOUNTER — Ambulatory Visit: Payer: Medicare Other | Admitting: Nurse Practitioner

## 2022-11-04 DIAGNOSIS — H25011 Cortical age-related cataract, right eye: Secondary | ICD-10-CM | POA: Diagnosis not present

## 2022-11-04 DIAGNOSIS — H2511 Age-related nuclear cataract, right eye: Secondary | ICD-10-CM | POA: Diagnosis not present

## 2022-11-04 DIAGNOSIS — H25811 Combined forms of age-related cataract, right eye: Secondary | ICD-10-CM | POA: Diagnosis not present

## 2022-11-04 DIAGNOSIS — Z961 Presence of intraocular lens: Secondary | ICD-10-CM | POA: Diagnosis not present

## 2022-11-04 DIAGNOSIS — H269 Unspecified cataract: Secondary | ICD-10-CM | POA: Diagnosis not present

## 2022-11-04 LAB — TOXASSURE SELECT 13 (MW), URINE

## 2022-11-06 ENCOUNTER — Encounter (HOSPITAL_COMMUNITY)
Admission: RE | Admit: 2022-11-06 | Discharge: 2022-11-06 | Disposition: A | Discharge status: Home / Self Care | Payer: Medicare Other | Source: Ambulatory Visit | Attending: Internal Medicine | Admitting: Internal Medicine

## 2022-11-06 ENCOUNTER — Other Ambulatory Visit: Payer: Self-pay | Admitting: Internal Medicine

## 2022-11-06 VITALS — Ht 73.0 in | Wt 217.4 lb

## 2022-11-06 DIAGNOSIS — G4700 Insomnia, unspecified: Secondary | ICD-10-CM

## 2022-11-06 DIAGNOSIS — J449 Chronic obstructive pulmonary disease, unspecified: Secondary | ICD-10-CM | POA: Diagnosis not present

## 2022-11-06 NOTE — Progress Notes (Signed)
Daily Session Note  Patient Details  Name: Dennis [name] MRN: [id number] Date of Birth: Jun 25, 1951 Referring Provider:   Flowsheet Row PULMONARY REHAB COPD ORIENTATION from 07/01/2022 in [place]  Referring Provider Dr. Melvyn Novas       Encounter Date: 11/06/2022  Check In:  Session Check In - 11/06/22 1043       Check-In   Supervising physician immediately available to respond to emergencies [facility] MD immediately available    Physician(s) Dr. Domenic Polite    Location [facility]    Staff Present Leana Roe, BS, Exercise Physiologist;Anida Deol Sherrie George, MS, ACSM-CEP    Virtual Visit No    Medication changes reported     No    Fall or balance concerns reported    No    Tobacco Cessation No Change    Warm-up and Cool-down Performed as group-led instruction    Resistance Training Performed Yes    VAD Patient? No    PAD/SET Patient? No      Pain Assessment   Currently in Pain? No/denies    Pain Score 0-No pain    Multiple Pain Sites No             Capillary Blood Glucose: No results found for this or any previous visit (from the past 24 hour(s)).    Social History   Tobacco Use  Smoking Status Former   Packs/day: 2.00   Years: 27.00   Total pack years: 54.00   Types: Cigarettes   Start date: 02/01/1971   Quit date: 01/31/1998   Years since quitting: 24.7  Smokeless Tobacco Current   Types: Chew    Goals Met:  Proper associated with RPD/PD & O2 Sat Independence with exercise equipment Using PLB without cueing & demonstrates good technique Exercise tolerated well Queuing for purse lip breathing No report of concerns or symptoms today Strength training completed today  Goals Unmet:  Not Applicable  Comments: checkout time is 1145   Dr. Kathie Dike is Medical Director for [facility].

## 2022-11-10 NOTE — Progress Notes (Signed)
Discharge Progress Report  Patient Details  Name: Dennis [name] MRN: [id number] Date of Birth: March 11, 1951 Referring Provider:   Flowsheet Row PULMONARY REHAB COPD ORIENTATION from 07/01/2022 in [place]  Referring Provider Dr. Melvyn Novas        Number of Visits: 31  Reason for Discharge:  Patient reached a stable level of exercise. Patient independent in their exercise. Patient has met program and personal goals.  Smoking History:  Social History   Tobacco Use  Smoking Status Former   Packs/day: 2.00   Years: 27.00   Total pack years: 54.00   Types: Cigarettes   Start date: 02/01/1971   Quit date: 01/31/1998   Years since quitting: 24.7  Smokeless Tobacco Current   Types: Chew    Diagnosis:  COPD mixed type ([place])  ADL [facility]:  Pulmonary Assessment Scores     Row Name 07/01/22 [phone] 11/10/22 1401       ADL [facility]   ADL Phase -- Exit    SOB Score total 40 59    Rest 1 1    Walk 2 3    Stairs 5 5    Bath 1 3    Dress 1 3    Shop 1 3      CAT Score   CAT Score 24 23      mMRC Score   mMRC Score 3 3             Initial Exercise Prescription:  Initial Exercise Prescription - 07/01/22 0900       Date of Initial Exercise RX and Referring Provider   Date 07/01/22    Referring Provider Dr. Melvyn Novas    Expected Discharge Date 11/04/22      Recumbant Elliptical   Level 1    RPM 45    Minutes 39      Prescription Details   Frequency (times per week) 2    Duration Progress to 30 minutes of continuous aerobic without signs/symptoms of physical distress      Intensity   THRR 40-80% of Max Heartrate 60-121    Ratings of Perceived Exertion 11-13    Perceived Dyspnea 0-4      Resistance Training   Training Prescription Yes    Weight 4    Reps 10-15             Discharge Exercise Prescription (Final Exercise Prescription Changes):  Exercise Prescription Changes - 10/30/22 1100       Home Exercise Plan   Plans to continue  exercise at Home (comment)    Frequency Add 3 additional days to program exercise sessions.    Initial Home Exercises Provided 10/30/22             Functional Capacity:  6 Minute Walk     Row Name 07/01/22 0947 11/06/22 1301       6 Minute Walk   Phase Initial Discharge    Distance 1200 feet 1200 feet    Walk Time 6 minutes 6 minutes    # of Rest Breaks 0 0    MPH 2.27 2.2    METS 2.56 2.83    RPE 15 12    Perceived Dyspnea  11 13    VO2 Peak 8.96 9.9    Symptoms No --    Resting HR 55 bpm 54 bpm    Resting BP 102/70 120/66    Resting Oxygen Saturation  93 % 98 %    Exercise Oxygen Saturation  during 6 min walk 93 % 93 %    Max Ex. HR 74 bpm 103 bpm    Max Ex. BP 138/70 132/68    2 Minute Post BP 120/68 126/68      Interval HR   1 Minute HR 62 90    2 Minute HR 64 86    3 Minute HR 65 85    4 Minute HR 70 90    5 Minute HR 70 103    6 Minute HR 74 100    2 Minute Post HR 61 58    Interval Heart Rate? Yes Yes      Interval Oxygen   Interval Oxygen? Yes Yes    Baseline Oxygen Saturation % 93 % 98 %    1 Minute Oxygen Saturation % 93 % 96 %    1 Minute Liters of Oxygen 0 L 0 L    2 Minute Oxygen Saturation % 93 % 95 %    2 Minute Liters of Oxygen 0 L 0 L    3 Minute Oxygen Saturation % 93 % 94 %    3 Minute Liters of Oxygen 0 L 0 L    4 Minute Oxygen Saturation % 93 % 93 %    4 Minute Liters of Oxygen 0 L 0 L    5 Minute Oxygen Saturation % 93 % 93 %    5 Minute Liters of Oxygen 0 L 0 L    6 Minute Oxygen Saturation % 93 % 93 %    6 Minute Liters of Oxygen 0 L 0 L    2 Minute Post Oxygen Saturation % 95 % 96 %    2 Minute Post Liters of Oxygen 0 L 0 L             Psychological, QOL, Others - Outcomes: PHQ 2/9:    11/10/2022    2:03 PM 10/30/2022    8:43 AM 07/01/2022    8:48 AM 06/09/2022    1:45 PM 04/02/2022   11:54 AM  Depression screen PHQ 2/9  Decreased Interest _0 0 0  Down, Depressed, Hopeless _1 0 0  PHQ - 2 Score _2 0 0   Altered sleeping _3 Tired, decreased energy _4 Change in appetite 0 0 1    Feeling bad or failure about yourself  _5 Trouble concentrating _6 Moving slowly or fidgety/restless 1 0 3    Suicidal thoughts 0 0 0    PHQ-9 Score _7 Difficult doing work/chores Very difficult Not difficult at all Very difficult      Quality of Life:  Quality of Life - 11/06/22 1304       Quality of Life   Select Quality of Life      Quality of Life Scores   Health/Function Pre 17.13 %    Health/Function Post 15 %    Health/Function % Change -12.43 %    Socioeconomic Pre 21.75 %    Socioeconomic Post 26.71 %    Socioeconomic % Change  22.8 %    Psych/Spiritual Pre 19.93 %    Psych/Spiritual Post 20.57 %    Psych/Spiritual % Change 3.21 %    Family Pre 26.4 %    Family Post 22.8 %    Family % Change -13.64 %    GLOBAL  Pre 19.97 %    GLOBAL Post 19.57 %    GLOBAL % Change -2 %             Personal Goals: Goals established at orientation with interventions provided to work toward goal.  Personal Goals and Risk Factors at Admission - 07/01/22 0908       Core Components/Risk Factors/Patient Goals on Admission   Improve shortness of breath with ADL's Yes    Intervention Provide education, individualized exercise plan and daily activity instruction to help decrease symptoms of SOB with activities of daily living.    Expected Outcomes Short Term: Improve cardiorespiratory fitness to achieve a reduction of symptoms when performing ADLs;Long Term: Be able to perform more ADLs without symptoms or delay the onset of symptoms    Increase knowledge of respiratory medications and ability to use respiratory devices properly  Yes    Intervention Provide education and demonstration as needed of appropriate use of medications, inhalers, and oxygen therapy.    Expected Outcomes Short Term: Achieves understanding of medications use. Understands that oxygen is a medication  prescribed by physician. Demonstrates appropriate use of inhaler and oxygen therapy.;Long Term: Maintain appropriate use of medications, inhalers, and oxygen therapy.    Personal Goal Other Yes    Personal Goal Improve leg strength and stamina.    Intervention Attend PR two days per week and begin a home exercise plan.    Expected Outcomes Pt will reach stated goals.              Personal Goals Discharge:  Goals and Risk Factor Review     Row Name 07/02/22 0849 07/30/22 1410 08/26/22 0903 09/23/22 1013 10/21/22 1056     Core Components/Risk Factors/Patient Goals Review   Personal Goals Review Improve shortness of breath with ADL's;Other Improve shortness of breath with ADL's;Other Improve shortness of breath with ADL's;Other Improve shortness of breath with ADL's;Other Improve shortness of breath with ADL's;Other   Review Patient was referred to [facility] with COPD from [place]. He plans to strart the program tomorrow 07/03/22. His personal goals are to improve his strength and stamina in his legs. We will continue to monitor his progress as he works towards meeting these goals. Pt has completed 8 sessions.  He is progressing well.  VSS.  Sats are 91-96% on room air.  His personal goals are to improve his strength and stamina in his legs. We will continue to monitor his progress as he works towards meeting these goals. Pt has completed 15 sessions.  VSS.  Sats are 90-97% on room air.  His personal goals are to improve his strength and stamina in his legs. We will continue to monitor his progress as he works towards meeting these goals. Pt has completed 21 sessions.  VSS.  Sats are 90-95% on room air.  His personal goals are to improve his strength and stamina in his legs. We will continue to monitor his progress as he works towards meeting these goals. Pt has completed 26 sessions.  VSS.  Sats are 93-97% on room air.  His personal goals are to improve his strength and stamina in his legs. We will continue to  monitor his progress as he works towards meeting these goals.   Expected Outcomes Patient will complete the program meeting both personal and program goals. Patient will complete the program meeting both personal and program goals. Patient will complete the program meeting both personal and program goals. Patient will complete the program meeting both personal  and program goals. Patient will complete the program meeting both personal and program goals.    [place] Name 11/10/22 1412             Core Components/Risk Factors/Patient Goals Review   Personal Goals Review Improve shortness of breath with ADL's;Other       Review Pt graduated after 31 sessions. His vitals were WNLs while he was in the program. His MET level increased, but he was unable to increase his walk test distance. He is severely limited in his walking abilities due to intermittent claudication.       Expected Outcomes Pt will continue to work towards their goals post discharge.                Exercise Goals and Review:  Exercise Goals     Row Name 07/01/22 8676 07/08/22 1250 08/05/22 1148 09/02/22 1251 09/30/22 0917     Exercise Goals   Increase Physical Activity _0    Intervention Provide advice, education, support and counseling about physical activity/exercise needs.;Develop an individualized exercise prescription for aerobic and resistive training based on initial evaluation findings, risk stratification, comorbidities and participant's personal goals. Provide advice, education, support and counseling about physical activity/exercise needs.;Develop an individualized exercise prescription for aerobic and resistive training based on initial evaluation findings, risk stratification, comorbidities and participant's personal goals. Provide advice, education, support and counseling about physical activity/exercise needs.;Develop an individualized exercise prescription for aerobic and resistive training based on  initial evaluation findings, risk stratification, comorbidities and participant's personal goals. Provide advice, education, support and counseling about physical activity/exercise needs.;Develop an individualized exercise prescription for aerobic and resistive training based on initial evaluation findings, risk stratification, comorbidities and participant's personal goals. Provide advice, education, support and counseling about physical activity/exercise needs.;Develop an individualized exercise prescription for aerobic and resistive training based on initial evaluation findings, risk stratification, comorbidities and participant's personal goals.   Expected Outcomes Short Term: Attend rehab on a regular basis to increase amount of physical activity.;Long Term: Add in home exercise to make exercise part of routine and to increase amount of physical activity.;Long Term: Exercising regularly at least 3-5 days a week. Short Term: Attend rehab on a regular basis to increase amount of physical activity.;Long Term: Add in home exercise to make exercise part of routine and to increase amount of physical activity.;Long Term: Exercising regularly at least 3-5 days a week. Short Term: Attend rehab on a regular basis to increase amount of physical activity.;Long Term: Add in home exercise to make exercise part of routine and to increase amount of physical activity.;Long Term: Exercising regularly at least 3-5 days a week. Short Term: Attend rehab on a regular basis to increase amount of physical activity.;Long Term: Add in home exercise to make exercise part of routine and to increase amount of physical activity.;Long Term: Exercising regularly at least 3-5 days a week. Short Term: Attend rehab on a regular basis to increase amount of physical activity.;Long Term: Add in home exercise to make exercise part of routine and to increase amount of physical activity.;Long Term: Exercising regularly at least 3-5 days a week.    Increase Strength and Stamina _1    Intervention Provide advice, education, support and counseling about physical activity/exercise needs.;Develop an individualized exercise prescription for aerobic and resistive training based on initial evaluation findings, risk stratification, comorbidities and participant's personal goals. Provide advice, education, support and counseling about physical activity/exercise needs.;Develop an individualized exercise prescription for aerobic and  resistive training based on initial evaluation findings, risk stratification, comorbidities and participant's personal goals. Provide advice, education, support and counseling about physical activity/exercise needs.;Develop an individualized exercise prescription for aerobic and resistive training based on initial evaluation findings, risk stratification, comorbidities and participant's personal goals. Provide advice, education, support and counseling about physical activity/exercise needs.;Develop an individualized exercise prescription for aerobic and resistive training based on initial evaluation findings, risk stratification, comorbidities and participant's personal goals. Provide advice, education, support and counseling about physical activity/exercise needs.;Develop an individualized exercise prescription for aerobic and resistive training based on initial evaluation findings, risk stratification, comorbidities and participant's personal goals.   Expected Outcomes Short Term: Increase workloads from initial exercise prescription for resistance, speed, and METs.;Short Term: Perform resistance training exercises routinely during rehab and add in resistance training at home;Long Term: Improve cardiorespiratory fitness, muscular endurance and strength as measured by increased METs and functional capacity (6MWT) Short Term: Increase workloads from initial exercise prescription for resistance, speed, and METs.;Short Term:  Perform resistance training exercises routinely during rehab and add in resistance training at home;Long Term: Improve cardiorespiratory fitness, muscular endurance and strength as measured by increased METs and functional capacity (6MWT) Short Term: Increase workloads from initial exercise prescription for resistance, speed, and METs.;Short Term: Perform resistance training exercises routinely during rehab and add in resistance training at home;Long Term: Improve cardiorespiratory fitness, muscular endurance and strength as measured by increased METs and functional capacity (6MWT) Short Term: Increase workloads from initial exercise prescription for resistance, speed, and METs.;Short Term: Perform resistance training exercises routinely during rehab and add in resistance training at home;Long Term: Improve cardiorespiratory fitness, muscular endurance and strength as measured by increased METs and functional capacity (6MWT) Short Term: Increase workloads from initial exercise prescription for resistance, speed, and METs.;Short Term: Perform resistance training exercises routinely during rehab and add in resistance training at home;Long Term: Improve cardiorespiratory fitness, muscular endurance and strength as measured by increased METs and functional capacity (6MWT)   Able to understand and use rate of perceived exertion (RPE) scale _0    Intervention Provide education and explanation on how to use RPE scale Provide education and explanation on how to use RPE scale Provide education and explanation on how to use RPE scale Provide education and explanation on how to use RPE scale Provide education and explanation on how to use RPE scale   Expected Outcomes Short Term: Able to use RPE daily in rehab to express subjective intensity level;Long Term:  Able to use RPE to guide intensity level when exercising independently Short Term: Able to use RPE daily in rehab to express subjective intensity  level;Long Term:  Able to use RPE to guide intensity level when exercising independently Short Term: Able to use RPE daily in rehab to express subjective intensity level;Long Term:  Able to use RPE to guide intensity level when exercising independently Short Term: Able to use RPE daily in rehab to express subjective intensity level;Long Term:  Able to use RPE to guide intensity level when exercising independently Short Term: Able to use RPE daily in rehab to express subjective intensity level;Long Term:  Able to use RPE to guide intensity level when exercising independently   Able to understand and use Dyspnea scale _1    Intervention Provide education and explanation on how to use Dyspnea scale Provide education and explanation on how to use Dyspnea scale Provide education and explanation on how to use Dyspnea scale Provide education and explanation on  how to use Dyspnea scale Provide education and explanation on how to use Dyspnea scale   Expected Outcomes Short Term: Able to use Dyspnea scale daily in rehab to express subjective sense of shortness of breath during exertion;Long Term: Able to use Dyspnea scale to guide intensity level when exercising independently Short Term: Able to use Dyspnea scale daily in rehab to express subjective sense of shortness of breath during exertion;Long Term: Able to use Dyspnea scale to guide intensity level when exercising independently Short Term: Able to use Dyspnea scale daily in rehab to express subjective sense of shortness of breath during exertion;Long Term: Able to use Dyspnea scale to guide intensity level when exercising independently Short Term: Able to use Dyspnea scale daily in rehab to express subjective sense of shortness of breath during exertion;Long Term: Able to use Dyspnea scale to guide intensity level when exercising independently Short Term: Able to use Dyspnea scale daily in rehab to express subjective sense of shortness of breath  during exertion;Long Term: Able to use Dyspnea scale to guide intensity level when exercising independently   Knowledge and understanding of Target Heart Rate Range (THRR) Yes -- Yes Yes Yes   Intervention Provide education and explanation of THRR including how the numbers were predicted and where they are located for reference Provide education and explanation of THRR including how the numbers were predicted and where they are located for reference Provide education and explanation of THRR including how the numbers were predicted and where they are located for reference Provide education and explanation of THRR including how the numbers were predicted and where they are located for reference Provide education and explanation of THRR including how the numbers were predicted and where they are located for reference   Expected Outcomes Short Term: Able to state/look up THRR;Short Term: Able to use daily as guideline for intensity in rehab;Long Term: Able to use THRR to govern intensity when exercising independently Short Term: Able to state/look up THRR;Short Term: Able to use daily as guideline for intensity in rehab;Long Term: Able to use THRR to govern intensity when exercising independently Short Term: Able to state/look up THRR;Short Term: Able to use daily as guideline for intensity in rehab;Long Term: Able to use THRR to govern intensity when exercising independently Short Term: Able to state/look up THRR;Short Term: Able to use daily as guideline for intensity in rehab;Long Term: Able to use THRR to govern intensity when exercising independently Short Term: Able to state/look up THRR;Short Term: Able to use daily as guideline for intensity in rehab;Long Term: Able to use THRR to govern intensity when exercising independently   Understanding of Exercise Prescription _0    Intervention Provide education, explanation, and written materials on patient's individual exercise prescription Provide  education, explanation, and written materials on patient's individual exercise prescription Provide education, explanation, and written materials on patient's individual exercise prescription Provide education, explanation, and written materials on patient's individual exercise prescription Provide education, explanation, and written materials on patient's individual exercise prescription   Expected Outcomes Short Term: Able to explain program exercise prescription;Long Term: Able to explain home exercise prescription to exercise independently Short Term: Able to explain program exercise prescription;Long Term: Able to explain home exercise prescription to exercise independently Short Term: Able to explain program exercise prescription;Long Term: Able to explain home exercise prescription to exercise independently Short Term: Able to explain program exercise prescription;Long Term: Able to explain home exercise prescription to exercise independently Short Term: Able to explain program  exercise prescription;Long Term: Able to explain home exercise prescription to exercise independently    [place] Name 10/28/22 1305             Exercise Goals   Increase Physical Activity Yes       Intervention Provide advice, education, support and counseling about physical activity/exercise needs.;Develop an individualized exercise prescription for aerobic and resistive training based on initial evaluation findings, risk stratification, comorbidities and participant's personal goals.       Expected Outcomes Short Term: Attend rehab on a regular basis to increase amount of physical activity.;Long Term: Add in home exercise to make exercise part of routine and to increase amount of physical activity.;Long Term: Exercising regularly at least 3-5 days a week.       Increase Strength and Stamina Yes       Intervention Provide advice, education, support and counseling about physical activity/exercise needs.;Develop an  individualized exercise prescription for aerobic and resistive training based on initial evaluation findings, risk stratification, comorbidities and participant's personal goals.       Expected Outcomes Short Term: Increase workloads from initial exercise prescription for resistance, speed, and METs.;Short Term: Perform resistance training exercises routinely during rehab and add in resistance training at home;Long Term: Improve cardiorespiratory fitness, muscular endurance and strength as measured by increased METs and functional capacity (6MWT)       Able to understand and use rate of perceived exertion (RPE) scale Yes       Intervention Provide education and explanation on how to use RPE scale       Expected Outcomes Short Term: Able to use RPE daily in rehab to express subjective intensity level;Long Term:  Able to use RPE to guide intensity level when exercising independently       Able to understand and use Dyspnea scale Yes       Intervention Provide education and explanation on how to use Dyspnea scale       Expected Outcomes Short Term: Able to use Dyspnea scale daily in rehab to express subjective sense of shortness of breath during exertion;Long Term: Able to use Dyspnea scale to guide intensity level when exercising independently       Knowledge and understanding of Target Heart Rate Range (THRR) Yes       Intervention Provide education and explanation of THRR including how the numbers were predicted and where they are located for reference       Expected Outcomes Short Term: Able to state/look up THRR;Short Term: Able to use daily as guideline for intensity in rehab;Long Term: Able to use THRR to govern intensity when exercising independently       Understanding of Exercise Prescription Yes       Intervention Provide education, explanation, and written materials on patient's individual exercise prescription       Expected Outcomes Short Term: Able to explain program exercise  prescription;Long Term: Able to explain home exercise prescription to exercise independently                Exercise Goals Re-Evaluation:  Exercise Goals Re-Evaluation     Row Name 07/08/22 1251 08/05/22 1148 09/02/22 1251 09/30/22 0918 10/28/22 1306     Exercise Goal Re-Evaluation   Exercise Goals Review Increase Physical Activity;Increase Strength and Stamina;Able to understand and use rate of perceived exertion (RPE) scale;Able to understand and use Dyspnea scale;Able to check pulse independently;Understanding of Exercise Prescription Increase Physical Activity;Increase Strength and Stamina;Able to understand and use rate of perceived exertion (RPE)  scale;Able to understand and use Dyspnea scale;Knowledge and understanding of Target Heart Rate Range (THRR);Understanding of Exercise Prescription Increase Physical Activity;Increase Strength and Stamina;Able to understand and use rate of perceived exertion (RPE) scale;Able to understand and use Dyspnea scale;Knowledge and understanding of Target Heart Rate Range (THRR);Understanding of Exercise Prescription Increase Physical Activity;Increase Strength and Stamina;Able to understand and use rate of perceived exertion (RPE) scale;Able to understand and use Dyspnea scale;Knowledge and understanding of Target Heart Rate Range (THRR);Understanding of Exercise Prescription Increase Physical Activity;Increase Strength and Stamina;Able to understand and use rate of perceived exertion (RPE) scale;Able to understand and use Dyspnea scale;Knowledge and understanding of Target Heart Rate Range (THRR);Understanding of Exercise Prescription   Comments Pt has completed 2 sessions of PR. He is motivated during class and focused on increasing his distance on each class. He is currently exercising at 3.0 METs on the ellp. Will continue to monitor and progress as able. Pt has completed 9 sessions of PR. He is focued on increasing his level and distance for each class.  He is currently exercising at 2.9 METs on the ellp. Pt also has leg pain when first increasing his levels, he stated he is to see a vein specialist soon. Will monitor and progres as able. Pt has completed 17 sessions of PR. He is focused on increasing his distance each class and has increased his level. He is currently exercising at 1.7 METs on the ellp. Will monitor and progress as able. Pt has completed 22 sessions of PR. He continues to push himself and increase his distance. He is increasing his workloads. He is currently exercising at 3.6 METs on the ellp. Will continue to monitor and progress as able. Pt has completed 27 sessions of PR. He has not increased his workload lately. He is focused on increasing his distance on the ellp then his level. He is planning on joining the [facility] program after he graduates PR. He is currently exercising at 2.1 METs on  the ellp. Will continue to monitor and progress as able.   Expected Outcomes Through exercise at rehab and at home, the patient will meet their stated goals. Through exercise at rehab and at home, the patient will meet their stated goals. Through exercise at rehab and at home, the patient will meet their stated goals. Through exercise at rehab and at home, the patient will meet their stated goals. Through exercise at rehab and at home, the patient will meet their stated goals.            Nutrition & Weight - Outcomes:  Pre Biometrics - 07/01/22 0953       Pre Biometrics   Height _0  (1.854 m)    Weight 219 lb 5.7 oz (99.5 kg)    Waist Circumference 44 inches    Hip Circumference 43 inches    Waist to Hip Ratio 1.02 %    BMI (Calculated) 28.95    Triceps Skinfold 20 mm    % Body Fat 30.6 %    Grip Strength 45 kg    Flexibility 0 in    Single Leg Stand 4.35 seconds             Post Biometrics - 11/06/22 1303        Post  Biometrics   Height _1  (1.854 m)    Weight 217 lb 6 oz (98.6 kg)    Waist Circumference 42 inches     Hip Circumference 40 inches    Waist to Hip  Ratio 1.05 %    BMI (Calculated) 28.69    Triceps Skinfold 10 mm    % Body Fat 26.7 %    Grip Strength 48.9 kg    Flexibility 0 in    Single Leg Stand 5.7 seconds             Nutrition:  Nutrition Therapy & Goals - 07/02/22 0848       Personal Nutrition Goals   Comments Patient scored 57 on his diet assessment. We offer 2 educational sessions on heart healthy nutrition with handouts.      Intervention Plan   Intervention Nutrition handout(s) given to patient.    Expected Outcomes Short Term Goal: Understand basic principles of dietary content, such as calories, fat, sodium, cholesterol and nutrients.             Nutrition Discharge:  Nutrition Assessments - 11/10/22 1404       MEDFICTS Scores   Pre Score 57    Post Score 77    Score Difference 20             Education Questionnaire Score:  Knowledge Questionnaire Score - 11/10/22 1400       Knowledge Questionnaire Score   Pre Score 15/18    Post Score 14/18             Goals reviewed with patient; copy given to patient. Pt graduated from [facility] after 31 sessions. His walk test was the same at orientation and discharge. He still experiences intermittent claudication while walking. His MET level at discharge was 2.7. He will be joining our [employer] in January of 2024 to continue exercise.

## 2022-12-01 ENCOUNTER — Other Ambulatory Visit: Payer: Self-pay | Admitting: Internal Medicine

## 2022-12-01 DIAGNOSIS — I10 Essential (primary) hypertension: Secondary | ICD-10-CM

## 2022-12-29 ENCOUNTER — Ambulatory Visit (HOSPITAL_BASED_OUTPATIENT_CLINIC_OR_DEPARTMENT_OTHER): Payer: Medicare Other | Admitting: Internal Medicine

## 2022-12-30 ENCOUNTER — Telehealth: Payer: Self-pay | Admitting: Internal Medicine

## 2022-12-30 ENCOUNTER — Other Ambulatory Visit: Payer: Self-pay

## 2022-12-30 MED ORDER — FENOFIBRATE 160 MG PO TABS: 160.0000 mg | ORAL_TABLET | Freq: Every day | ORAL | 1 refills | Status: DC

## 2022-12-30 NOTE — Telephone Encounter (Signed)
Refills sent to pharmacy. 

## 2022-12-30 NOTE — Telephone Encounter (Signed)
Pt states his phar has tried to fax [place] several times for refill & got no response.   Prescription Request  12/30/2022  Is this a "Controlled Substance" medicine? No  LOV: 10/30/2022  What is the name of the medication or equipment? fenofibrate 160 MG tablet   Have you contacted your pharmacy to request a refill? Yes   Which pharmacy would you like this sent to?  [id number] [place] [facility] [address] [address]Phone: [phone]: [phone] [facility] [address]Phone: [phone]: [phone] [facility] [address] [address] [facility] [address]: [phone]: [phone] [place]Phone: [phone]: [phone] [id number] [place] [place]: [phone]: [phone]  Patient notified that their request is being sent to the clinical staff for review and that they should receive a response within 2 business days.   Please advise at [facility]

## 2023-01-16 ENCOUNTER — Ambulatory Visit: Payer: Medicare Other | Admitting: Cardiology

## 2023-01-18 ENCOUNTER — Other Ambulatory Visit: Payer: Self-pay | Admitting: Internal Medicine

## 2023-01-18 DIAGNOSIS — I10 Essential (primary) hypertension: Secondary | ICD-10-CM

## 2023-01-19 MED ORDER — LOSARTAN POTASSIUM-HCTZ 100-25 MG PO TABS: 1.0000 | ORAL_TABLET | Freq: Every day | ORAL | 3 refills | Status: DC

## 2023-01-19 NOTE — Addendum Note (Signed)
Addended byIhor Dow on: 01/19/2023 08:45 AM   Modules accepted: Orders

## 2023-02-02 ENCOUNTER — Telehealth: Payer: Self-pay | Admitting: Internal Medicine

## 2023-02-02 ENCOUNTER — Other Ambulatory Visit: Payer: Self-pay | Admitting: Internal Medicine

## 2023-02-02 DIAGNOSIS — N1831 Chronic kidney disease, stage 3a: Secondary | ICD-10-CM

## 2023-02-02 DIAGNOSIS — R7303 Prediabetes: Secondary | ICD-10-CM

## 2023-02-02 DIAGNOSIS — I10 Essential (primary) hypertension: Secondary | ICD-10-CM

## 2023-02-02 DIAGNOSIS — Z125 Encounter for screening for malignant neoplasm of prostate: Secondary | ICD-10-CM

## 2023-02-02 DIAGNOSIS — N401 Enlarged prostate with lower urinary tract symptoms: Secondary | ICD-10-CM

## 2023-02-02 DIAGNOSIS — E785 Hyperlipidemia, unspecified: Secondary | ICD-10-CM

## 2023-02-02 NOTE — Telephone Encounter (Signed)
Pt called in wants to have lab orders placed. States that he should be having labs done before appt in April.   Wants a call back in regard.

## 2023-02-02 NOTE — Telephone Encounter (Signed)
Spoke to patient

## 2023-02-03 DIAGNOSIS — R350 Frequency of micturition: Secondary | ICD-10-CM | POA: Diagnosis not present

## 2023-02-03 DIAGNOSIS — I10 Essential (primary) hypertension: Secondary | ICD-10-CM | POA: Diagnosis not present

## 2023-02-03 DIAGNOSIS — N401 Enlarged prostate with lower urinary tract symptoms: Secondary | ICD-10-CM | POA: Diagnosis not present

## 2023-02-03 DIAGNOSIS — R7303 Prediabetes: Secondary | ICD-10-CM | POA: Diagnosis not present

## 2023-02-03 DIAGNOSIS — N1831 Chronic kidney disease, stage 3a: Secondary | ICD-10-CM | POA: Diagnosis not present

## 2023-02-03 DIAGNOSIS — E785 Hyperlipidemia, unspecified: Secondary | ICD-10-CM | POA: Diagnosis not present

## 2023-02-03 DIAGNOSIS — Z125 Encounter for screening for malignant neoplasm of prostate: Secondary | ICD-10-CM | POA: Diagnosis not present

## 2023-02-05 LAB — CBC WITH DIFFERENTIAL/PLATELET
Basophils Absolute: 0.1 10*3/uL (ref 0.0–0.2)
Basos: 1 %
EOS (ABSOLUTE): 0.2 10*3/uL (ref 0.0–0.4)
Eos: 2 %
Hematocrit: 39.7 % (ref 37.5–51.0)
Hemoglobin: 13.4 g/dL (ref 13.0–17.7)
Immature Grans (Abs): 0 10*3/uL (ref 0.0–0.1)
Immature Granulocytes: 0 %
Lymphocytes Absolute: 2.5 10*3/uL (ref 0.7–3.1)
Lymphs: 35 %
MCH: 31.1 pg (ref 26.6–33.0)
MCHC: 33.8 g/dL (ref 31.5–35.7)
MCV: 92 fL (ref 79–97)
Monocytes Absolute: 0.8 10*3/uL (ref 0.1–0.9)
Monocytes: 12 %
Neutrophils Absolute: 3.5 10*3/uL (ref 1.4–7.0)
Neutrophils: 50 %
Platelets: 165 10*3/uL (ref 150–450)
RBC: 4.31 x10E6/uL (ref 4.14–5.80)
RDW: 11.9 % (ref 11.6–15.4)
WBC: 7.1 10*3/uL (ref 3.4–10.8)

## 2023-02-05 LAB — LIPID PANEL
Chol/HDL Ratio: 5.3 ratio — ABNORMAL HIGH (ref 0.0–5.0)
Cholesterol, Total: 208 mg/dL — ABNORMAL HIGH (ref 100–199)
HDL: 39 mg/dL — ABNORMAL LOW (ref >39)
LDL Chol Calc (NIH): 138 mg/dL — ABNORMAL HIGH (ref 0–99)
Triglycerides: 171 mg/dL — ABNORMAL HIGH (ref 0–149)
VLDL Cholesterol Cal: 31 mg/dL (ref 5–40)

## 2023-02-05 LAB — PROTEIN / CREATININE RATIO, URINE
Creatinine, Urine: 85.6 mg/dL
Protein, Ur: 5.8 mg/dL
Protein/Creat Ratio: 68 mg/g creat (ref 0–200)

## 2023-02-05 LAB — PSA: Prostate Specific Ag, Serum: 1.1 ng/mL (ref 0.0–4.0)

## 2023-02-05 LAB — CMP14+EGFR
ALT: 17 IU/L (ref 0–44)
AST: 26 IU/L (ref 0–40)
Albumin/Globulin Ratio: 2.1 (ref 1.2–2.2)
Albumin: 4.2 g/dL (ref 3.9–4.9)
Alkaline Phosphatase: 23 IU/L — ABNORMAL LOW (ref 44–121)
BUN/Creatinine Ratio: 16 (ref 10–24)
BUN: 28 mg/dL — ABNORMAL HIGH (ref 8–27)
Bilirubin Total: 0.4 mg/dL (ref 0.0–1.2)
CO2: 26 mmol/L (ref 20–29)
Calcium: 9.8 mg/dL (ref 8.6–10.2)
Chloride: 96 mmol/L (ref 96–106)
Creatinine, Ser: 1.79 mg/dL — ABNORMAL HIGH (ref 0.76–1.27)
Globulin, Total: 2 g/dL (ref 1.5–4.5)
Glucose: 103 mg/dL — ABNORMAL HIGH (ref 70–99)
Potassium: 4.7 mmol/L (ref 3.5–5.2)
Sodium: 135 mmol/L (ref 134–144)
Total Protein: 6.2 g/dL (ref 6.0–8.5)
eGFR: 40 mL/min/{1.73_m2} — ABNORMAL LOW (ref >59)

## 2023-02-05 LAB — HEMOGLOBIN A1C
Est. average glucose Bld gHb Est-mCnc: 126 mg/dL
Hgb A1c MFr Bld: 6 % — ABNORMAL HIGH (ref 4.8–5.6)

## 2023-02-05 LAB — PARATHYROID HORMONE, INTACT (NO CA): PTH: 18 pg/mL (ref 15–65)

## 2023-02-05 LAB — VITAMIN D 25 HYDROXY (VIT D DEFICIENCY, FRACTURES): Vit D, 25-Hydroxy: 24 ng/mL — ABNORMAL LOW (ref 30.0–100.0)

## 2023-02-05 LAB — TSH: TSH: 4.47 u[IU]/mL (ref 0.450–4.500)

## 2023-02-05 NOTE — Progress Notes (Signed)
Cardiology Office Note  Date: 02/06/2023   ID: Dennis [name], DOB [birth year], MRN [id number]  History of Present Illness: Dennis [name] is a 72 y.o. male last seen in October 2023.  He is here today with his wife for a follow-up visit.  He continues to report intermittent chest "twinges," responsiveness to nitroglycerin at times as well.  He does feel somewhat better since we started Imdur following his echocardiogram and Myoview noted below.  We discussed the results of the Myoview, certainly possible that he has had some development of graft disease or progressive native CAD since CABG in 2005.  We discussed rationale for optimizing medical therapy and the possibility of a diagnostic cardiac catheterization depending on how he does.  He is currently in the maintenance exercise program, did both pulmonary and cardiac rehab previously.  We went over his medications today.  He has continued on Vytorin with prior history of statin intolerance due to myalgias.  Unfortunately was not able to take Praluent due to subsequent development of a rash.  We discussed follow-up with the lipid clinic as he may be a good candidate for Leqvio.  Physical Exam: VS:  BP 136/78   Pulse (!) 56   Ht 6\' 1"  (1.854 m)   Wt 211 lb 6.4 oz (95.9 kg)   SpO2 95%   BMI 27.89 kg/m , BMI Body mass index is 27.89 kg/m.  Wt Readings from Last 3 Encounters:  02/06/23 211 lb 6.4 oz (95.9 kg)  11/06/22 217 lb 6 oz (98.6 kg)  10/30/22 214 lb (97.1 kg)    General: Patient appears comfortable at rest. HEENT: Conjunctiva and lids normal. Neck: Supple, no elevated JVP or carotid bruits. Lungs: Clear to auscultation, nonlabored breathing at rest. Cardiac: Regular rate and rhythm, no S3 or significant systolic murmur. Extremities: No pitting edema.  ECG:  An ECG dated 09/11/2022 was personally reviewed today and demonstrated:  Sinus rhythm with diffuse nonspecific ST-T wave abnormalities.  Labwork: 02/03/2023: ALT 17;  AST 26; BUN 28; Creatinine, Ser 1.79; Hemoglobin 13.4; Platelets 165; Potassium 4.7; Sodium 135; TSH 4.470     Component Value Date/Time   CHOL 208 (H) 02/03/2023 0810   TRIG 171 (H) 02/03/2023 0810   HDL 39 (L) 02/03/2023 0810   CHOLHDL 5.3 (H) 02/03/2023 0810   CHOLHDL 9.4 (H) 01/11/2018 0833   VLDL 35 08/25/2014 0922   LDLCALC 138 (H) 02/03/2023 0810   LDLCALC  01/11/2018 [id number]     Comment:     . LDL cholesterol not calculated. Triglyceride levels greater than 400 mg/dL invalidate calculated LDL results. . Reference range: <100 . Desirable range <100 mg/dL for primary prevention;   <70 mg/dL for patients with CHD or diabetic patients  with > or = 2 CHD risk factors. Marland Kitchen LDL-C is now calculated using the Martin-Hopkins  calculation, which is a validated novel method providing  better accuracy than the Friedewald equation in the  estimation of LDL-C.  Cresenciano Genre et al. Annamaria Helling. [id number]): 2061-2068  (http://education.[employer].com/faq/FAQ164)    Other Studies Reviewed Today:  Echocardiogram 09/19/2022:  1. Left ventricular ejection fraction, by estimation, is 55 to 60%. The  left ventricle has normal function. The left ventricle has no regional  wall motion abnormalities. There is mild left ventricular hypertrophy.  Left ventricular diastolic parameters  were normal.   2. Right ventricular systolic function is normal. The right ventricular  size is normal.   3. The mitral valve is normal in structure. No evidence  of mitral valve  regurgitation. No evidence of mitral stenosis.   4. The aortic valve has an indeterminant number of cusps. There is mild  calcification of the aortic valve. There is mild thickening of the aortic  valve. Aortic valve regurgitation is not visualized. No aortic stenosis is  present.   5. Aortic dilatation noted. There is mild dilatation of the aortic root,  measuring 40 mm.   6. The inferior vena cava is normal in size with greater than 50%   respiratory variability, suggesting right atrial pressure of 3 mmHg.   Lexiscan Myoview 09/19/2022:   The study is low risk.   Small to moderate size moderate intensity inferior defect with mild reversibility and normal wall motion. Finding may represent prior inferior infarct with mild peri-infarct ischemia, however given preserved wall motion also consider slight differences in diaphragmatic attenuation artifact. Either finding would support low risk.   Left ventricular function is normal. Nuclear stress EF: 57 %. End diastolic cavity size is normal.  Assessment and Plan:  1.  Multivessel CAD status post CABG in 2005 with LIMA to LAD, right radial circumflex, and SVG to RCA.  Follow-up Lexiscan Myoview in October 2023 was overall low risk showing possible inferior distribution infarct scar with mild peri-infarct ischemia, although variable soft tissue attenuation also present.  LVEF 55 to 60% at that time without regional wall motion abnormalities.  Certainly possible that he has had some progressive native CAD/graft disease since CABG and we plan for now to uptitrate Imdur to 30 mg daily and reevaluate.  We did discuss possibility of a follow-up diagnostic cardiac catheterization if his symptoms or not adequately controlled with medical therapy.  Currently on aspirin, Hyzaar, Lopressor, Imdur, Vytorin, and as needed nitroglycerin.  2.  Mixed hyperlipidemia with statin myalgias.  He did not tolerate Praluent due to rash.  He has gone back on Vytorin in the interim, still having leg weakness and his most recent LDL was suboptimal at 138.  Recommending referral back to the lipid clinic, may be a good candidate for Leqvio.  3.  PAD with claudication.  He had a visit with Dr. Donnetta Hutching with plan for follow-up over time on medical therapy.  Disposition:  Follow up  4 to 6 weeks.  Signed, Satira Sark, M.D., F.A.C.C.

## 2023-02-06 ENCOUNTER — Encounter: Payer: Self-pay | Admitting: Cardiology

## 2023-02-06 ENCOUNTER — Ambulatory Visit: Payer: Medicare Other | Attending: Nurse Practitioner | Admitting: Cardiology

## 2023-02-06 VITALS — BP 136/78 | HR 56 | Ht 73.0 in | Wt 211.4 lb

## 2023-02-06 DIAGNOSIS — T466X5A Adverse effect of antihyperlipidemic and antiarteriosclerotic drugs, initial encounter: Secondary | ICD-10-CM | POA: Insufficient documentation

## 2023-02-06 DIAGNOSIS — I739 Peripheral vascular disease, unspecified: Secondary | ICD-10-CM | POA: Diagnosis not present

## 2023-02-06 DIAGNOSIS — T466X5D Adverse effect of antihyperlipidemic and antiarteriosclerotic drugs, subsequent encounter: Secondary | ICD-10-CM

## 2023-02-06 DIAGNOSIS — I25119 Atherosclerotic heart disease of native coronary artery with unspecified angina pectoris: Secondary | ICD-10-CM

## 2023-02-06 DIAGNOSIS — M791 Myalgia, unspecified site: Secondary | ICD-10-CM | POA: Diagnosis not present

## 2023-02-06 DIAGNOSIS — E782 Mixed hyperlipidemia: Secondary | ICD-10-CM | POA: Diagnosis not present

## 2023-02-06 MED ORDER — ISOSORBIDE MONONITRATE ER 30 MG PO TB24: 30.0000 mg | ORAL_TABLET | Freq: Every day | ORAL | 3 refills | Status: DC

## 2023-02-06 NOTE — Patient Instructions (Signed)
Medication Instructions:  INCREASE Imdur to 30 mg at night  Labwork: None today  Testing/Procedures: None today  Follow-Up: 4-6 weeks  Any Other Special Instructions Will Be Listed Below (If Applicable).  If you need a refill on your cardiac medications before your next appointment, please call your pharmacy.

## 2023-02-26 ENCOUNTER — Other Ambulatory Visit: Payer: Self-pay | Admitting: Internal Medicine

## 2023-02-26 ENCOUNTER — Ambulatory Visit (INDEPENDENT_AMBULATORY_CARE_PROVIDER_SITE_OTHER): Payer: Medicare Other | Admitting: Internal Medicine

## 2023-02-26 ENCOUNTER — Telehealth: Payer: Self-pay | Admitting: Internal Medicine

## 2023-02-26 ENCOUNTER — Encounter: Payer: Self-pay | Admitting: Internal Medicine

## 2023-02-26 VITALS — BP 123/74 | HR 68 | Ht 73.0 in | Wt 216.6 lb

## 2023-02-26 DIAGNOSIS — I10 Essential (primary) hypertension: Secondary | ICD-10-CM

## 2023-02-26 DIAGNOSIS — I739 Peripheral vascular disease, unspecified: Secondary | ICD-10-CM

## 2023-02-26 DIAGNOSIS — R7303 Prediabetes: Secondary | ICD-10-CM

## 2023-02-26 DIAGNOSIS — I25118 Atherosclerotic heart disease of native coronary artery with other forms of angina pectoris: Secondary | ICD-10-CM

## 2023-02-26 DIAGNOSIS — I483 Typical atrial flutter: Secondary | ICD-10-CM | POA: Diagnosis not present

## 2023-02-26 DIAGNOSIS — E785 Hyperlipidemia, unspecified: Secondary | ICD-10-CM | POA: Diagnosis not present

## 2023-02-26 DIAGNOSIS — G4700 Insomnia, unspecified: Secondary | ICD-10-CM | POA: Diagnosis not present

## 2023-02-26 DIAGNOSIS — F419 Anxiety disorder, unspecified: Secondary | ICD-10-CM | POA: Diagnosis not present

## 2023-02-26 DIAGNOSIS — N1832 Chronic kidney disease, stage 3b: Secondary | ICD-10-CM

## 2023-02-26 DIAGNOSIS — J42 Unspecified chronic bronchitis: Secondary | ICD-10-CM

## 2023-02-26 MED ORDER — ALBUTEROL SULFATE HFA 108 (90 BASE) MCG/ACT IN AERS: 2.0000 | INHALATION_SPRAY | RESPIRATORY_TRACT | 5 refills | Status: DC | PRN

## 2023-02-26 MED ORDER — ZOLPIDEM TARTRATE 10 MG PO TABS: 10.0000 mg | ORAL_TABLET | Freq: Every evening | ORAL | 3 refills | Status: DC | PRN

## 2023-02-26 MED ORDER — ALPRAZOLAM 0.5 MG PO TABS: 0.5000 mg | ORAL_TABLET | Freq: Two times a day (BID) | ORAL | 3 refills | Status: DC | PRN

## 2023-02-26 MED ORDER — REPATHA 140 MG/ML ~~LOC~~ SOSY: 1.0000 mL | PREFILLED_SYRINGE | SUBCUTANEOUS | 5 refills | Status: DC

## 2023-02-26 MED ORDER — SYMBICORT 160-4.5 MCG/ACT IN AERO: INHALATION_SPRAY | RESPIRATORY_TRACT | 1 refills | Status: DC

## 2023-02-26 MED ORDER — ALBUTEROL SULFATE (2.5 MG/3ML) 0.083% IN NEBU: 2.5000 mg | INHALATION_SOLUTION | Freq: Four times a day (QID) | RESPIRATORY_TRACT | 3 refills | Status: DC | PRN

## 2023-02-26 MED ORDER — LOSARTAN POTASSIUM 100 MG PO TABS: 100.0000 mg | ORAL_TABLET | Freq: Every day | ORAL | 1 refills | Status: DC

## 2023-02-26 MED ORDER — FLUTICASONE-SALMETEROL 250-50 MCG/ACT IN AEPB: 1.0000 | INHALATION_SPRAY | Freq: Two times a day (BID) | RESPIRATORY_TRACT | 3 refills | Status: DC

## 2023-02-26 NOTE — Assessment & Plan Note (Signed)
Quit smoking in 1999 On Symbicort and Albuterol Switched to [employer] due to insurance formulary Advised to comply with [place] to avoid frequent exacerbations Undergoing pulmonary rehab program - paperwork signed for [facility] forever fit program

## 2023-02-26 NOTE — Assessment & Plan Note (Signed)
Used to follow up with lipid clinic Was on Praluent, had severe fatigue and rash with it -could not tolerate it Continue Vytorin for now Checked lipid profile - still has elevated LDL, added Repatha

## 2023-02-26 NOTE — Telephone Encounter (Signed)
Pt wants to know if a urine check (previously done) with his labs can be done when he comes back for labs?

## 2023-02-26 NOTE — Assessment & Plan Note (Signed)
Takes Ambien as needed 

## 2023-02-26 NOTE — Patient Instructions (Signed)
Please start taking Losartan 100 mg once daily instead of Losartan-HCTZ.  Please start taking Repatha as prescribed.  Please continue taking other medications as prescribed.  Please continue to follow low salt diet and ambulate as tolerated.

## 2023-02-26 NOTE — Assessment & Plan Note (Signed)
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 Continue Aspirin and Vytorin Still has uncontrolled HLD, will try to add Repatha Continue Metoprolol and Imdur

## 2023-02-26 NOTE — Progress Notes (Signed)
Established Patient Office Visit  Subjective:  Patient ID: Dennis [name], male    DOB: [birth year]  Age: 72 y.o. MRN: [id number]  CC:  Chief Complaint  Patient presents with   Coronary Artery Disease    Follow up on CAD and CKD. Patient would like to discuss medication changes.    HPI Dennis [name] is a 72 y.o. male with past medical history of CAD s/p CABG, HTN, HLD, COPD, anxiety and insomnia who presents for f/u of his chronic medical conditions.  HTN: BP is well-controlled. He takes losartan/HCTZ and Imdur currently.  His dose of Imdur was recently increased to 30 mg.  He has had episodes of hypotension at times, up to 90s/60s at home. Patient denies headache, dizziness, dyspnea or palpitations.    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  He is placed on Imdur for episodes of chest pain, which has improved his symptoms. He complains of leg pain, worse with walking.  He denies any cold sensation or pallor of the feet.  He has had [place] ABI of LE in the past, which showed moderate bilateral lower extremity arterial occlusive disease.  He was seen by vascular surgeon, and was advised for arteriogram, but he prefers to wait for now.  HLD: He has stopped taking Vytorin as he has leg pain.  He is also, concerned about his kidney function getting worse with Vytorin.  I tried to explain potential benefits of staying compliant to statin therapy.  He has tried Repatha in the past, which caused generalized rash.  CKD: His BMP showed stable GFR at 40.  Denies any dysuria, hematuria or urinary hesitancy or resistance.  He is trying to improve fluid intake.  Anxiety/insomnia/PTSD: Currently well controlled with Xanax.  He gets intermittent episodes of severe anxiety spells, where he gets flushed and has mild dyspnea, but they have been less frequent recently.  He also takes Ambien for insomnia.     Past Medical History:  Diagnosis Date   Atrial flutter     Diagnosed by ECG August 2015   Colitis    COPD (chronic obstructive pulmonary disease)    Coronary atherosclerosis of native coronary artery    Multivessel status post CABG   Essential hypertension    Hyperlipidemia    Insomnia     Past Surgical History:  Procedure Laterality Date   COLONOSCOPY  01/29/2005   Dr. Gala Romney; rectal polyp s/p polypectomy, otherwise normal.  Pathology with tubulovillous adenoma.   COLONOSCOPY  02/06/2008   Dr. Gala Romney; minimal internal hemorrhoids, diminutive polyp in the splenic flexure s/p cold biopsy removal, otherwise normal.  Pathology with benign polypoid colonic mucosa.   COLONOSCOPY WITH PROPOFOL N/A 08/30/2015   Procedure: COLONOSCOPY WITH PROPOFOL at cecum at 0814; withdrawal time=8 minutes;  Surgeon: Daneil Dolin, MD; internal hemorrhoids-likely source of hematochezia, otherwise normal exam.  Repeat in 5 years.   COLONOSCOPY WITH PROPOFOL N/A 01/28/2021   Procedure: COLONOSCOPY WITH PROPOFOL;  Surgeon: Daneil Dolin, MD;  Location: [facility];  Service: Endoscopy;  Laterality: N/A;  am appt   CORONARY ARTERY BYPASS GRAFT  2005   Dr. Lucianne Lei Trigt: LIMA to LAD, right radial to circumflex, SVG to RCA   CORONARY ARTERY BYPASS GRAFT  10/16/2004   ELECTROPHYSIOLOGIC STUDY N/A 11/21/2016   Procedure: A-Flutter Ablation;  Surgeon: Evans Lance, MD;  Location: [facility];  Service: Cardiovascular;  Laterality: N/A;   TEE WITHOUT CARDIOVERSION N/A  11/21/2016   Procedure: TRANSESOPHAGEAL ECHOCARDIOGRAM (TEE);  Surgeon: Sanda Klein, MD;  Location: [facility];  Service: Cardiovascular;  Laterality: N/A;    Family History  Problem Relation Age of Onset   Hypertension Mother    Alcohol abuse Mother    Alcohol abuse Father    Colon cancer Neg Hx     Social History   Socioeconomic History   Marital status: Married    Spouse name: Not on file   Number of children: 3   Years of education: Not on file   Highest education level: Not on file   Occupational History   Not on file  Tobacco Use   Smoking status: Former    Packs/day: 2.00    Years: 27.00    Additional pack years: 0.00    Total pack years: 54.00    Types: Cigarettes    Start date: 02/01/1971    Quit date: 01/31/1998    Years since quitting: 25.0   Smokeless tobacco: Current    Types: Chew  Vaping Use   Vaping Use: Never used  Substance and Sexual Activity   Alcohol use: Yes    Alcohol/week: 0.0 standard drinks of alcohol    Comment: Drinks beer daily, typically 2-4 a day. Also 4 oz red wine an evening.   Drug use: No   Sexual activity: Not on file  Other Topics Concern   Not on file  Social History Narrative   Lives with wife, Anderson [place]   Social Determinants of Health   Financial Resource Strain: Medium Risk (08/05/2021)   Overall Financial Resource Strain (CARDIA)    Difficulty of Paying Living Expenses: Somewhat hard  Food Insecurity: No Food Insecurity (08/05/2021)   Hunger Vital Sign    Worried About Running Out of Food in the Last Year: Never true    [place] in the Last Year: Never true  Transportation Needs: No Transportation Needs (08/05/2021)   PRAPARE - Hydrologist (Medical): No    Lack of Transportation (Non-Medical): No  Physical Activity: Insufficiently Active (08/05/2021)   Exercise Vital Sign    Days of Exercise per Week: 3 days    Minutes of Exercise per Session: 30 min  Stress: No Stress Concern Present (08/05/2021)   [place]    Feeling of Stress : Only a little  Social Connections: Moderately Isolated (08/05/2021)   Social Connection and Isolation Panel [NHANES]    Frequency of Communication with Friends and Family: More than three times a week    Frequency of Social Gatherings with Friends and Family: More than three times a week    Attends Religious Services: Never    Marine scientist or Organizations: No    Attends English as a second language teacher Meetings: Never    Marital Status: Married  Human resources officer Violence: Not At Risk (08/05/2021)   Humiliation, Afraid, Rape, and Kick questionnaire    Fear of Current or Ex-Partner: No    Emotionally Abused: No    Physically Abused: No    Sexually Abused: No    Outpatient Medications Prior to Visit  Medication Sig Dispense Refill   aspirin EC 81 MG tablet Take 1 tablet (81 mg total) by mouth daily. (Patient taking differently: Take 81 mg by mouth 2 (two) times a week.) 30 tablet 11   ezetimibe-simvastatin (VYTORIN) 10-40 MG tablet TAKE 1 TABLET BY MOUTH AT BEDTIME. TAKE 1 TABLET BY MOUTH EVERYDAY AT  BEDTIME 90 tablet 1   fenofibrate 160 MG tablet Take 1 tablet (160 mg total) by mouth daily. 90 tablet 1   fish oil-omega-3 fatty acids 1000 MG capsule Take 1 g by mouth in the morning and at bedtime.     isosorbide mononitrate (IMDUR) 30 MG 24 hr tablet Take 1 tablet (30 mg total) by mouth at bedtime. 90 tablet 3   ketoconazole (NIZORAL) 2 % cream Apply 1 Application topically daily. 30 g 0   metoprolol tartrate (LOPRESSOR) 50 MG tablet TAKE 1 TABLET BY MOUTH TWICE A DAY 180 tablet 1   nitroGLYCERIN (NITROSTAT) 0.4 MG SL tablet PLACE 1 TABLET (0.4 MG TOTAL) UNDER THE TONGUE EVERY 5 (FIVE) MINUTES AS NEEDED. 25 tablet 3   Respiratory Therapy Supplies (NEBULIZER/TUBING/MOUTHPIECE) KIT Nebulizer tubing and mouthpiece 1 kit 0   tamsulosin (FLOMAX) 0.4 MG CAPS capsule Take 1 capsule (0.4 mg total) by mouth daily. 90 capsule 3   Vitamin D, Ergocalciferol, (DRISDOL) 1.25 MG (50000 UNIT) CAPS capsule Take 1 capsule (50,000 Units total) by mouth every 7 (seven) days. 12 capsule 1   albuterol (PROVENTIL) (2.5 MG/3ML) 0.083% nebulizer solution Take 3 mLs (2.5 mg total) by nebulization every 6 (six) hours as needed for wheezing or shortness of breath. 150 mL 3   albuterol (VENTOLIN HFA) 108 (90 Base) MCG/ACT inhaler Inhale 2 puffs into the lungs every 4 (four) hours as needed for wheezing. 18  g 5   ALPRAZolam (XANAX) 0.5 MG tablet TAKE 1 TABLET BY MOUTH 2 TIMES DAILY AS NEEDED FOR ANXIETY. 30 tablet 3   budesonide-formoterol (SYMBICORT) 160-4.5 MCG/ACT inhaler TAKE 2 PUFFS FIRST THING IN MORNING AND THEN ANOTHER 2 PUFFS ABOUT 12 HOURS LATER. 30.6 each 1   guaiFENesin (MUCINEX) 600 MG 12 hr tablet Take 1 tablet (600 mg total) by mouth 2 (two) times daily. 20 tablet 0   losartan-hydrochlorothiazide (HYZAAR) 100-25 MG tablet Take 1 tablet by mouth daily. 90 tablet 3   mupirocin ointment (BACTROBAN) 2 % Apply 1 application topically 2 (two) times daily. (Patient taking differently: Apply 1 application  topically 2 (two) times daily as needed (wound care).) 22 g 0   zolpidem (AMBIEN) 10 MG tablet TAKE 1 TABLET BY MOUTH AT BEDTIME AS NEEDED FOR SLEEP. 30 tablet 3   No facility-administered medications prior to visit.    Allergies  Allergen Reactions   Ace Inhibitors Shortness Of Breath and Cough   Enalapril Cough    Pt has been switched to Valsartan and is tolerating the different class.   Dexamethasone Itching    Pt was prescribed this in december   Alirocumab Rash and Other (See Comments)    blisters   Clotrimazole-Betamethasone Rash   Prednisone Rash    ROS Review of Systems  Constitutional:  Negative for chills and fever.  HENT:  Negative for congestion and sore throat.   Eyes:  Negative for pain and discharge.  Respiratory:  Negative for cough and shortness of breath.   Cardiovascular:  Negative for chest pain and palpitations.  Gastrointestinal:  Negative for diarrhea, nausea and vomiting.  Endocrine: Negative for polydipsia and polyuria.  Genitourinary:  Negative for dysuria, flank pain and hematuria.       Nocturia  Musculoskeletal:  Negative for neck pain and neck stiffness.       Leg pain  Skin:  Negative for rash.  Neurological:  Negative for dizziness, weakness, numbness and headaches.  Psychiatric/Behavioral:  Positive for sleep disturbance. Negative for  agitation, behavioral problems,  dysphoric mood and suicidal ideas. The patient is nervous/anxious.       Objective:    Physical Exam Vitals reviewed.  Constitutional:      General: He is not in acute distress.    Appearance: He is not diaphoretic.  HENT:     Head: Normocephalic and atraumatic.     Nose: Nose normal.     Mouth/Throat:     Mouth: Mucous membranes are moist.  Eyes:     General: No scleral icterus.    Extraocular Movements: Extraocular movements intact.  Cardiovascular:     Rate and Rhythm: Normal rate and regular rhythm.     Heart sounds: Normal heart sounds. No murmur heard. Pulmonary:     Breath sounds: Normal breath sounds. No wheezing or rales.  Musculoskeletal:     Cervical back: Neck supple. No tenderness.     Right lower leg: No edema.     Left lower leg: No edema.  Skin:    General: Skin is warm.     Findings: No rash.  Neurological:     General: No focal deficit present.     Mental Status: He is alert and oriented to person, place, and time.     Motor: Weakness (Bilateral LE-4/5) present.  Psychiatric:        Mood and Affect: Mood normal.        Behavior: Behavior normal.     BP 123/74 (BP Location: Right Arm, Patient Position: Sitting, Cuff Size: Large)   Pulse 68   Ht 6\' 1"  (1.854 m)   Wt 216 lb 9.6 oz (98.2 kg)   SpO2 93%   BMI 28.58 kg/m  Wt Readings from Last 3 Encounters:  02/26/23 216 lb 9.6 oz (98.2 kg)  02/06/23 211 lb 6.4 oz (95.9 kg)  11/06/22 217 lb 6 oz (98.6 kg)    Lab Results  Component Value Date   TSH 4.470 02/03/2023   Lab Results  Component Value Date   WBC 7.1 02/03/2023   HGB 13.4 02/03/2023   HCT 39.7 02/03/2023   MCV 92 02/03/2023   PLT 165 02/03/2023   Lab Results  Component Value Date   NA 135 02/03/2023   K 4.7 02/03/2023   CO2 26 02/03/2023   GLUCOSE 103 (H) 02/03/2023   BUN 28 (H) 02/03/2023   CREATININE 1.79 (H) 02/03/2023   BILITOT 0.4 02/03/2023   ALKPHOS 23 (L) 02/03/2023   AST 26  02/03/2023   ALT 17 02/03/2023   PROT 6.2 02/03/2023   ALBUMIN 4.2 02/03/2023   CALCIUM 9.8 02/03/2023   ANIONGAP 6 03/25/2020   EGFR 40 (L) 02/03/2023   GFR 60.41 08/13/2015   Lab Results  Component Value Date   CHOL 208 (H) 02/03/2023   Lab Results  Component Value Date   HDL 39 (L) 02/03/2023   Lab Results  Component Value Date   LDLCALC 138 (H) 02/03/2023   Lab Results  Component Value Date   TRIG 171 (H) 02/03/2023   Lab Results  Component Value Date   CHOLHDL 5.3 (H) 02/03/2023   Lab Results  Component Value Date   HGBA1C 6.0 (H) 02/03/2023      Assessment & Plan:   Problem List Items Addressed This Visit       Cardiovascular and Mediastinum   Essential hypertension, benign - Primary    BP Readings from Last 1 Encounters:  02/26/23 123/74  Well-controlled with Hyzaar, Metoprolol and Imdur Has had episodes of hypotension, discontinue HCTZ, continue  losartan 100 mg QD Counseled for compliance with the medications Advised DASH diet and moderate exercise/walking, at least 150 mins/week      Relevant Medications   losartan (COZAAR) 100 MG tablet   Evolocumab (REPATHA) 140 MG/ML SOSY   Coronary atherosclerosis of native coronary artery    S/p CABG in the past Recent nuclear study showed reversible inferior wall ischemia with LVEF 57%. Continue Aspirin and Vytorin Still has uncontrolled HLD, will try to add Repatha Continue Metoprolol and Imdur      Relevant Medications   losartan (COZAAR) 100 MG tablet   Evolocumab (REPATHA) 140 MG/ML SOSY   Atrial flutter    S/p cardiac ablation In sinus rhythm currently On Metoprolol Followed by cardiology      Relevant Medications   losartan (COZAAR) 100 MG tablet   Evolocumab (REPATHA) 140 MG/ML SOSY   PVD (peripheral vascular disease) with claudication    Has leg claudication symptoms Was on aspirin in the past, but had recurrent GI bleeding with it - restarted QOD for now On Vytorin currently, added  Repatha [place] ABI showed - moderate bilateral lower extremity arterial occlusive disease Had vascular surgery evaluation -  was advised observation, he prefers to wait for arteriogram for now      Relevant Medications   losartan (COZAAR) 100 MG tablet   Evolocumab (REPATHA) 140 MG/ML SOSY     Respiratory   COPD (chronic obstructive pulmonary disease)    Quit smoking in 1999 On Symbicort and Albuterol Switched to [employer] due to insurance formulary Advised to comply with [place] to avoid frequent exacerbations Undergoing pulmonary rehab program - paperwork signed for [place] forever fit program      Relevant Medications   albuterol (VENTOLIN HFA) 108 (90 Base) MCG/ACT inhaler   fluticasone-salmeterol (WIXELA INHUB) 250-50 MCG/ACT AEPB     Genitourinary   Chronic kidney disease, stage 3b    CMP reviewed GFR stable around 40 now Will monitor for now On ARB, DC HCTZ due to concern for dehydration/hypotension Advised for proper hydration Avoid nephrotoxic agents      Relevant Orders   CMP14+EGFR   CBC with Differential/Platelet     Other   Anxiety (Chronic)    Takes Xanax PRN, for panic episodes Associated with previous incident with his wife (cardiac arrest) Symptoms better now Takes Ambien 10 mg QD for insomnia      Relevant Medications   ALPRAZolam (XANAX) 0.5 MG tablet   Hyperlipidemia LDL goal <100    Used to follow up with lipid clinic Was on Praluent, had severe fatigue and rash with it -could not tolerate it Continue Vytorin for now Checked lipid profile - still has elevated LDL, added Repatha      Relevant Medications   losartan (COZAAR) 100 MG tablet   Evolocumab (REPATHA) 140 MG/ML SOSY   albuterol (VENTOLIN HFA) 108 (90 Base) MCG/ACT inhaler   Other Relevant Orders   Lipid Profile   Insomnia    Takes Ambien as needed      Relevant Medications   zolpidem (AMBIEN) 10 MG tablet   Prediabetes    Lab Results  Component Value Date   HGBA1C 6.0 (H)  02/03/2023  Advised to continue to follow low carb diet      Relevant Orders   Hemoglobin A1c   Meds ordered this encounter  Medications   losartan (COZAAR) 100 MG tablet    Sig: Take 1 tablet (100 mg total) by mouth daily.    Dispense:  90  tablet    Refill:  1   ALPRAZolam (XANAX) 0.5 MG tablet    Sig: Take 1 tablet (0.5 mg total) by mouth 2 (two) times daily as needed for anxiety.    Dispense:  30 tablet    Refill:  3    Not to exceed 4 additional fills before 12/06/2022   zolpidem (AMBIEN) 10 MG tablet    Sig: Take 1 tablet (10 mg total) by mouth at bedtime as needed. for sleep    Dispense:  30 tablet    Refill:  3    Not to exceed 2 additional fills before 01/04/2023   Evolocumab (REPATHA) 140 MG/ML SOSY    Sig: Inject 1 mL into the skin every 14 (fourteen) days.    Dispense:  2 mL    Refill:  5   DISCONTD: albuterol (PROVENTIL) (2.5 MG/3ML) 0.083% nebulizer solution    Sig: Take 3 mLs (2.5 mg total) by nebulization every 6 (six) hours as needed for wheezing or shortness of breath.    Dispense:  150 mL    Refill:  3    Dx: COPD exacerbation J44.1   albuterol (VENTOLIN HFA) 108 (90 Base) MCG/ACT inhaler    Sig: Inhale 2 puffs into the lungs every 4 (four) hours as needed for wheezing.    Dispense:  18 g    Refill:  5    Please give a 90 day supply   DISCONTD: SYMBICORT 160-4.5 MCG/ACT inhaler    Sig: TAKE 2 PUFFS FIRST THING IN MORNING AND THEN ANOTHER 2 PUFFS ABOUT 12 HOURS LATER.    Dispense:  30.6 each    Refill:  1   fluticasone-salmeterol (WIXELA INHUB) 250-50 MCG/ACT AEPB    Sig: Inhale 1 puff into the lungs in the morning and at bedtime.    Dispense:  60 each    Refill:  3    Follow-up: Return in about 4 months (around 06/28/2023) for CKD and HLD.    Lindell Spar, MD

## 2023-02-26 NOTE — Assessment & Plan Note (Signed)
Takes Xanax PRN, for panic episodes Associated with previous incident with his wife (cardiac arrest) Symptoms better now Takes Ambien 10 mg QD for insomnia 

## 2023-02-26 NOTE — Assessment & Plan Note (Signed)
BP Readings from Last 1 Encounters:  02/26/23 123/74   Well-controlled with Hyzaar, Metoprolol and Imdur Has had episodes of hypotension, discontinue HCTZ, continue losartan 100 mg QD Counseled for compliance with the medications Advised DASH diet and moderate exercise/walking, at least 150 mins/week

## 2023-02-26 NOTE — Assessment & Plan Note (Addendum)
Lab Results  Component Value Date   HGBA1C 6.0 (H) 02/03/2023   Advised to continue to follow low carb diet

## 2023-02-26 NOTE — Assessment & Plan Note (Signed)
Has leg claudication symptoms Was on aspirin in the past, but had recurrent GI bleeding with it - restarted QOD for now On Vytorin currently, added Repatha [place] ABI showed - moderate bilateral lower extremity arterial occlusive disease Had vascular surgery evaluation -  was advised observation, he prefers to wait for arteriogram for now

## 2023-02-26 NOTE — Assessment & Plan Note (Signed)
S/p cardiac ablation In sinus rhythm currently On Metoprolol Followed by cardiology

## 2023-02-26 NOTE — Assessment & Plan Note (Addendum)
CMP reviewed GFR stable around 40 now Will monitor for now On ARB, DC HCTZ due to concern for dehydration/hypotension Advised for proper hydration Avoid nephrotoxic agents

## 2023-03-03 ENCOUNTER — Ambulatory Visit: Payer: Medicare Other | Admitting: Internal Medicine

## 2023-03-23 ENCOUNTER — Telehealth: Payer: Self-pay | Admitting: Internal Medicine

## 2023-03-23 NOTE — Telephone Encounter (Signed)
Patient just got this prescription taken care of this, has had first couple of shots in the refrigerator concern on how to give this shot and where to his self, call patient back at [phone].

## 2023-03-23 NOTE — Telephone Encounter (Signed)
Spoke to patient

## 2023-04-02 ENCOUNTER — Ambulatory Visit: Payer: Medicare Other | Admitting: Cardiology

## 2023-04-07 ENCOUNTER — Telehealth: Payer: Self-pay | Admitting: Internal Medicine

## 2023-04-07 NOTE — Telephone Encounter (Signed)
DMV forms  Copied Noted Sleeved  Call patient when ready

## 2023-04-13 NOTE — Telephone Encounter (Signed)
Pt spouse aware form ready for pick up

## 2023-04-14 ENCOUNTER — Telehealth: Payer: Self-pay | Admitting: Internal Medicine

## 2023-04-14 NOTE — Telephone Encounter (Signed)
Patient came by the office asking to change med does not like the 100mg .  Losartan 100mg  requesting go back to 100-125 mg on Losartan.  Please contact patient at [phone].  If sent in pharmacy used : Pharmacy  [facility] [id number] - [place], [place] - [address] AT [facility] [address], [place] [address] Phone: [phone]  Fax: [phone] DEA #: [id number]  DAW Reason: --    Per patient has enough to last until Dr Allena Katz comes back into the office.

## 2023-04-17 ENCOUNTER — Ambulatory Visit: Payer: Medicare Other | Admitting: Internal Medicine

## 2023-04-27 NOTE — Telephone Encounter (Signed)
Left message for patient to return call to our office. 

## 2023-04-28 ENCOUNTER — Telehealth: Payer: Self-pay

## 2023-04-28 ENCOUNTER — Other Ambulatory Visit: Payer: Self-pay | Admitting: Internal Medicine

## 2023-04-28 DIAGNOSIS — I10 Essential (primary) hypertension: Secondary | ICD-10-CM

## 2023-04-28 MED ORDER — LOSARTAN POTASSIUM-HCTZ 100-25 MG PO TABS: 1.0000 | ORAL_TABLET | Freq: Every day | ORAL | 1 refills | Status: DC

## 2023-04-28 NOTE — Telephone Encounter (Signed)
Patient called back and left a voicemail on 06.03.2024 at 4:38 pm. Returning nurse call.

## 2023-04-28 NOTE — Telephone Encounter (Signed)
Left message for patient to return call to our office. 

## 2023-04-28 NOTE — Telephone Encounter (Signed)
Left message for patient to return call.

## 2023-05-12 ENCOUNTER — Other Ambulatory Visit: Payer: Self-pay | Admitting: Internal Medicine

## 2023-05-12 DIAGNOSIS — E559 Vitamin D deficiency, unspecified: Secondary | ICD-10-CM

## 2023-05-27 ENCOUNTER — Other Ambulatory Visit: Payer: Self-pay | Admitting: Internal Medicine

## 2023-05-27 DIAGNOSIS — I10 Essential (primary) hypertension: Secondary | ICD-10-CM

## 2023-05-29 ENCOUNTER — Ambulatory Visit: Payer: Medicare Other | Admitting: Internal Medicine

## 2023-05-31 ENCOUNTER — Other Ambulatory Visit: Payer: Self-pay | Admitting: Internal Medicine

## 2023-06-25 DIAGNOSIS — R7303 Prediabetes: Secondary | ICD-10-CM | POA: Diagnosis not present

## 2023-06-25 DIAGNOSIS — N1831 Chronic kidney disease, stage 3a: Secondary | ICD-10-CM | POA: Diagnosis not present

## 2023-06-25 DIAGNOSIS — E785 Hyperlipidemia, unspecified: Secondary | ICD-10-CM | POA: Diagnosis not present

## 2023-06-30 ENCOUNTER — Ambulatory Visit (INDEPENDENT_AMBULATORY_CARE_PROVIDER_SITE_OTHER): Payer: Medicare Other | Admitting: Internal Medicine

## 2023-06-30 ENCOUNTER — Encounter: Payer: Self-pay | Admitting: Internal Medicine

## 2023-06-30 VITALS — BP 128/71 | HR 72 | Ht 73.0 in | Wt 212.2 lb

## 2023-06-30 DIAGNOSIS — I25118 Atherosclerotic heart disease of native coronary artery with other forms of angina pectoris: Secondary | ICD-10-CM

## 2023-06-30 DIAGNOSIS — G4700 Insomnia, unspecified: Secondary | ICD-10-CM

## 2023-06-30 DIAGNOSIS — J42 Unspecified chronic bronchitis: Secondary | ICD-10-CM

## 2023-06-30 DIAGNOSIS — I739 Peripheral vascular disease, unspecified: Secondary | ICD-10-CM

## 2023-06-30 DIAGNOSIS — F419 Anxiety disorder, unspecified: Secondary | ICD-10-CM

## 2023-06-30 DIAGNOSIS — L918 Other hypertrophic disorders of the skin: Secondary | ICD-10-CM | POA: Diagnosis not present

## 2023-06-30 DIAGNOSIS — E559 Vitamin D deficiency, unspecified: Secondary | ICD-10-CM

## 2023-06-30 DIAGNOSIS — I10 Essential (primary) hypertension: Secondary | ICD-10-CM

## 2023-06-30 DIAGNOSIS — E782 Mixed hyperlipidemia: Secondary | ICD-10-CM | POA: Diagnosis not present

## 2023-06-30 DIAGNOSIS — N1832 Chronic kidney disease, stage 3b: Secondary | ICD-10-CM | POA: Diagnosis not present

## 2023-06-30 DIAGNOSIS — R7303 Prediabetes: Secondary | ICD-10-CM | POA: Diagnosis not present

## 2023-06-30 DIAGNOSIS — I483 Typical atrial flutter: Secondary | ICD-10-CM

## 2023-06-30 MED ORDER — BUDESONIDE-FORMOTEROL FUMARATE 160-4.5 MCG/ACT IN AERO: 2.0000 | INHALATION_SPRAY | Freq: Two times a day (BID) | RESPIRATORY_TRACT | 5 refills | Status: DC

## 2023-06-30 MED ORDER — ALPRAZOLAM 0.5 MG PO TABS: 0.5000 mg | ORAL_TABLET | Freq: Two times a day (BID) | ORAL | 3 refills | Status: DC | PRN

## 2023-06-30 MED ORDER — ZOLPIDEM TARTRATE 10 MG PO TABS: 10.0000 mg | ORAL_TABLET | Freq: Every evening | ORAL | 3 refills | Status: DC | PRN

## 2023-06-30 NOTE — Assessment & Plan Note (Addendum)
Used to follow up with lipid clinic Was on Praluent, had severe fatigue and rash with it -could not tolerate it Continue Vytorin for now Checked lipid profile - still has elevated LDL, on Repatha - unclear if he has technique issue, needs nursing education for administration Has statin intolerance

## 2023-06-30 NOTE — Assessment & Plan Note (Addendum)
CMP reviewed GFR stable around 45 now Will monitor for now On ARB Advised for proper hydration Avoid nephrotoxic agents

## 2023-06-30 NOTE — Assessment & Plan Note (Signed)
S/p cardiac ablation In sinus rhythm currently On Metoprolol Followed by cardiology

## 2023-06-30 NOTE — Assessment & Plan Note (Signed)
Takes Xanax PRN, for panic episodes Associated with previous incident with his wife (cardiac arrest) Symptoms better now Takes Ambien 10 mg QD for insomnia

## 2023-06-30 NOTE — Assessment & Plan Note (Signed)
Has leg claudication symptoms Was on aspirin in the past, but had recurrent GI bleeding with it - restarted QOD for now On Repatha [place] ABI showed - moderate bilateral lower extremity arterial occlusive disease Had vascular surgery evaluation -  was advised observation, he prefers to wait for arteriogram for now

## 2023-06-30 NOTE — Progress Notes (Addendum)
Established Patient Office Visit  Subjective:  Patient ID: Dennis [name], male    DOB: 07/03/1951  Age: 72 y.o. MRN: [id number]  CC:  Chief Complaint  Patient presents with   Hyperlipidemia    Four month follow up    Chronic Kidney Disease    Four month follow up    Skin Tag    Patient has a skin tag under his arm he would like removed    HPI Dennis [name] is a 72 y.o. male with past medical history of CAD s/p CABG, HTN, HLD, COPD, anxiety and insomnia who presents for f/u of his chronic medical conditions.  HTN: BP is well-controlled. He takes losartan/HCTZ and Imdur currently.  Patient denies headache, dizziness, dyspnea or palpitations.    CAD and PAD: He is placed on Imdur for episodes of chest pain, which has improved his symptoms. He complains of leg pain, worse with walking.  He denies any cold sensation or pallor of the feet.  He has had [place] ABI of LE in the past, which showed moderate bilateral lower extremity arterial occlusive disease.  He was seen by vascular surgeon, and was advised for arteriogram, but he prefers to wait for now.  HLD: He has stopped taking Vytorin as he had leg pain. He is on Repatha now. His LDL improved only up to 114 from 138.  CKD: His BMP showed stable GFR at 47.  Denies any dysuria, hematuria or urinary hesitancy or resistance.  He is trying to improve fluid intake.  Anxiety/insomnia/PTSD: Currently well controlled with Xanax.  He gets intermittent episodes of severe anxiety spells, where he gets flushed and has mild dyspnea, but they have been less frequent recently.  He also takes Ambien for insomnia.  Skin tags: He has bilateral axillary skin tags, and reports mild itching in the right axilla.   Past Medical History:  Diagnosis Date   Atrial flutter (HCC)    Diagnosed by ECG August 2015   Colitis    COPD (chronic obstructive pulmonary disease) (HCC)    Coronary atherosclerosis of native coronary artery    Multivessel status post  CABG   Essential hypertension    Hyperlipidemia    Insomnia     Past Surgical History:  Procedure Laterality Date   COLONOSCOPY  01/29/2005   Dr. Jena Gauss; rectal polyp s/p polypectomy, otherwise normal.  Pathology with tubulovillous adenoma.   COLONOSCOPY  02/06/2008   Dr. Jena Gauss; minimal internal hemorrhoids, diminutive polyp in the splenic flexure s/p cold biopsy removal, otherwise normal.  Pathology with benign polypoid colonic mucosa.   COLONOSCOPY WITH PROPOFOL N/A 08/30/2015   Procedure: COLONOSCOPY WITH PROPOFOL at cecum at 0814; withdrawal time=8 minutes;  Surgeon: Corbin Ade, MD; internal hemorrhoids-likely source of hematochezia, otherwise normal exam.  Repeat in 5 years.   COLONOSCOPY WITH PROPOFOL N/A 01/28/2021   Procedure: COLONOSCOPY WITH PROPOFOL;  Surgeon: Corbin Ade, MD;  Location: [facility];  Service: Endoscopy;  Laterality: N/A;  am appt   CORONARY ARTERY BYPASS GRAFT  2005   Dr. Zenaida Niece Trigt: LIMA to LAD, right radial to circumflex, SVG to RCA   CORONARY ARTERY BYPASS GRAFT  10/16/2004   ELECTROPHYSIOLOGIC STUDY N/A 11/21/2016   Procedure: A-Flutter Ablation;  Surgeon: Marinus Maw, MD;  Location: [facility];  Service: Cardiovascular;  Laterality: N/A;   TEE WITHOUT CARDIOVERSION N/A 11/21/2016   Procedure: TRANSESOPHAGEAL ECHOCARDIOGRAM (TEE);  Surgeon: Thurmon Fair, MD;  Location: [facility] ENDOSCOPY;  Service: Cardiovascular;  Laterality: N/A;    Family History  Problem Relation Age of Onset   Hypertension Mother    Alcohol abuse Mother    Alcohol abuse Father    Colon cancer Neg Hx     Social History   Socioeconomic History   Marital status: Married    Spouse name: Not on file   Number of children: 3   Years of education: Not on file   Highest education level: Not on file  Occupational History   Not on file  Tobacco Use   Smoking status: Former    Current packs/day: 0.00    Average packs/day: 2.0 packs/day for 27.0 years (54.0 ttl  pk-yrs)    Types: Cigarettes    Start date: 02/01/1971    Quit date: 01/31/1998    Years since quitting: 25.4   Smokeless tobacco: Current    Types: Chew  Vaping Use   Vaping status: Never Used  Substance and Sexual Activity   Alcohol use: Yes    Alcohol/week: 0.0 standard drinks of alcohol    Comment: Drinks beer daily, typically 2-4 a day. Also 4 oz red wine an evening.   Drug use: No   Sexual activity: Not on file  Other Topics Concern   Not on file  Social History Narrative   Lives with wife, Dennis [name]   Social Determinants of Health   Financial Resource Strain: Medium Risk (08/05/2021)   Overall Financial Resource Strain (CARDIA)    Difficulty of Paying Living Expenses: Somewhat hard  Food Insecurity: No Food Insecurity (08/05/2021)   Hunger Vital Sign    Worried About Running Out of Food in the Last Year: Never true    Ran Out of Food in the Last Year: Never true  Transportation Needs: No Transportation Needs (08/05/2021)   PRAPARE - Administrator, Civil Service (Medical): No    Lack of Transportation (Non-Medical): No  Physical Activity: Insufficiently Active (08/05/2021)   Exercise Vital Sign    Days of Exercise per Week: 3 days    Minutes of Exercise per Session: 30 min  Stress: No Stress Concern Present (08/05/2021)   [employer] of Occupational Health - Occupational Stress Questionnaire    Feeling of Stress : Only a little  Social Connections: Moderately Isolated (08/05/2021)   Social Connection and Isolation Panel [NHANES]    Frequency of Communication with Friends and Family: More than three times a week    Frequency of Social Gatherings with Friends and Family: More than three times a week    Attends Religious Services: Never    Database administrator or Organizations: No    Attends Banker Meetings: Never    Marital Status: Married  Catering manager Violence: Not At Risk (08/05/2021)   Humiliation, Afraid, Rape, and Kick  questionnaire    Fear of Current or Ex-Partner: No    Emotionally Abused: No    Physically Abused: No    Sexually Abused: No    Outpatient Medications Prior to Visit  Medication Sig Dispense Refill   albuterol (VENTOLIN HFA) 108 (90 Base) MCG/ACT inhaler Inhale 2 puffs into the lungs every 4 (four) hours as needed for wheezing. 18 g 5   aspirin EC 81 MG tablet Take 1 tablet (81 mg total) by mouth daily. (Patient taking differently: Take 81 mg by mouth 2 (two) times a week.) 30 tablet 11   Evolocumab (REPATHA) 140 MG/ML SOSY Inject 1 mL into the skin every 14 (fourteen) days. 2 mL  5   fenofibrate 160 MG tablet TAKE 1 TABLET BY MOUTH EVERY DAY 90 tablet 1   fish oil-omega-3 fatty acids 1000 MG capsule Take 1 g by mouth in the morning and at bedtime.     isosorbide mononitrate (IMDUR) 30 MG 24 hr tablet Take 1 tablet (30 mg total) by mouth at bedtime. 90 tablet 3   ketoconazole (NIZORAL) 2 % cream Apply 1 Application topically daily. 30 g 0   levalbuterol (XOPENEX) 1.25 MG/3ML nebulizer solution Take 1.25 mg by nebulization every 6 (six) hours as needed for wheezing or shortness of breath. 360 mL 1   losartan-hydrochlorothiazide (HYZAAR) 100-25 MG tablet Take 1 tablet by mouth daily. 90 tablet 1   metoprolol tartrate (LOPRESSOR) 50 MG tablet TAKE 1 TABLET BY MOUTH TWICE A DAY 180 tablet 1   nitroGLYCERIN (NITROSTAT) 0.4 MG SL tablet PLACE 1 TABLET (0.4 MG TOTAL) UNDER THE TONGUE EVERY 5 (FIVE) MINUTES AS NEEDED. 25 tablet 3   Respiratory Therapy Supplies (NEBULIZER/TUBING/MOUTHPIECE) KIT Nebulizer tubing and mouthpiece 1 kit 0   tamsulosin (FLOMAX) 0.4 MG CAPS capsule Take 1 capsule (0.4 mg total) by mouth daily. 90 capsule 3   Vitamin D, Ergocalciferol, (DRISDOL) 1.25 MG (50000 UNIT) CAPS capsule TAKE 1 CAPSULE (50,000 UNITS TOTAL) BY MOUTH EVERY 7 (SEVEN) DAYS 12 capsule 1   ALPRAZolam (XANAX) 0.5 MG tablet Take 1 tablet (0.5 mg total) by mouth 2 (two) times daily as needed for anxiety. 30  tablet 3   ezetimibe-simvastatin (VYTORIN) 10-40 MG tablet TAKE 1 TABLET BY MOUTH AT BEDTIME. TAKE 1 TABLET BY MOUTH EVERYDAY AT BEDTIME 90 tablet 1   fluticasone-salmeterol (WIXELA INHUB) 250-50 MCG/ACT AEPB Inhale 1 puff into the lungs in the morning and at bedtime. 60 each 3   zolpidem (AMBIEN) 10 MG tablet Take 1 tablet (10 mg total) by mouth at bedtime as needed. for sleep 30 tablet 3   No facility-administered medications prior to visit.    Allergies  Allergen Reactions   Ace Inhibitors Shortness Of Breath and Cough   Enalapril Cough    Pt has been switched to Valsartan and is tolerating the different class.   Dexamethasone Itching    Pt was prescribed this in december   Alirocumab Rash and Other (See Comments)    blisters   Clotrimazole-Betamethasone Rash   Prednisone Rash    ROS Review of Systems  Constitutional:  Negative for chills and fever.  HENT:  Negative for congestion and sore throat.   Eyes:  Negative for pain and discharge.  Respiratory:  Negative for cough and shortness of breath.   Cardiovascular:  Negative for chest pain and palpitations.  Gastrointestinal:  Negative for diarrhea, nausea and vomiting.  Endocrine: Negative for polydipsia and polyuria.  Genitourinary:  Negative for dysuria, flank pain and hematuria.       Nocturia  Musculoskeletal:  Negative for neck pain and neck stiffness.       Leg pain  Skin:  Positive for rash.       Skin tag in axillae  Neurological:  Negative for dizziness, weakness, numbness and headaches.  Psychiatric/Behavioral:  Positive for sleep disturbance. Negative for agitation, behavioral problems, dysphoric mood and suicidal ideas. The patient is nervous/anxious.       Objective:    Physical Exam Vitals reviewed.  Constitutional:      General: He is not in acute distress.    Appearance: He is not diaphoretic.  HENT:     Head: Normocephalic and atraumatic.  Nose: Nose normal.     Mouth/Throat:     Mouth:  Mucous membranes are moist.  Eyes:     General: No scleral icterus.    Extraocular Movements: Extraocular movements intact.  Cardiovascular:     Rate and Rhythm: Normal rate and regular rhythm.     Heart sounds: Normal heart sounds. No murmur heard. Pulmonary:     Breath sounds: Normal breath sounds. No wheezing or rales.  Musculoskeletal:     Cervical back: Neck supple. No tenderness.     Right lower leg: No edema.     Left lower leg: No edema.  Skin:    General: Skin is warm.     Findings: Rash (Erythematous patch in the right axillary area) present.     Comments: Skin tags in bilateral axillae  Neurological:     General: No focal deficit present.     Mental Status: He is alert and oriented to person, place, and time.     Motor: Weakness (Bilateral LE-4/5) present.  Psychiatric:        Mood and Affect: Mood normal.        Behavior: Behavior normal.     BP 128/71 (BP Location: Left Arm, Patient Position: Sitting, Cuff Size: Normal)   Pulse 72   Ht 6\' 1"  (1.854 m)   Wt 212 lb 3.2 oz (96.3 kg)   SpO2 93%   BMI 28.00 kg/m  Wt Readings from Last 3 Encounters:  06/30/23 212 lb 3.2 oz (96.3 kg)  02/26/23 216 lb 9.6 oz (98.2 kg)  02/06/23 211 lb 6.4 oz (95.9 kg)    Lab Results  Component Value Date   TSH 4.470 02/03/2023   Lab Results  Component Value Date   WBC 6.7 06/25/2023   HGB 13.8 06/25/2023   HCT 42.0 06/25/2023   MCV 91 06/25/2023   PLT 155 06/25/2023   Lab Results  Component Value Date   NA 135 06/25/2023   K 4.6 06/25/2023   CO2 25 06/25/2023   GLUCOSE 99 06/25/2023   BUN 23 06/25/2023   CREATININE 1.57 (H) 06/25/2023   BILITOT 0.5 06/25/2023   ALKPHOS 26 (L) 06/25/2023   AST 19 06/25/2023   ALT 16 06/25/2023   PROT 6.6 06/25/2023   ALBUMIN 4.3 06/25/2023   CALCIUM 9.6 06/25/2023   ANIONGAP 6 03/25/2020   EGFR 47 (L) 06/25/2023   GFR 60.41 08/13/2015   Lab Results  Component Value Date   CHOL 203 (H) 06/25/2023   Lab Results  Component  Value Date   HDL 47 06/25/2023   Lab Results  Component Value Date   LDLCALC 114 (H) 06/25/2023   Lab Results  Component Value Date   TRIG 243 (H) 06/25/2023   Lab Results  Component Value Date   CHOLHDL 4.3 06/25/2023   Lab Results  Component Value Date   HGBA1C 5.9 (H) 06/25/2023      Assessment & Plan:   Problem List Items Addressed This Visit       Cardiovascular and Mediastinum   Essential hypertension, benign    BP Readings from Last 1 Encounters:  06/30/23 128/71   Well-controlled with Hyzaar, Metoprolol and Imdur Counseled for compliance with the medications Advised DASH diet and moderate exercise/walking, at least 150 mins/week      Coronary atherosclerosis of native coronary artery    S/p CABG in the past Recent nuclear study showed reversible inferior wall ischemia with LVEF 57%. Continue Aspirin and Vytorin Still has uncontrolled HLD,  have added Repatha - unclear if he has technique issue, needs nursing education for administration Continue Metoprolol and Imdur      Atrial flutter (HCC)    S/p cardiac ablation In sinus rhythm currently On Metoprolol Followed by cardiology      PVD (peripheral vascular disease) with claudication (HCC)    Has leg claudication symptoms Was on aspirin in the past, but had recurrent GI bleeding with it - restarted QOD for now On Repatha [place] ABI showed - moderate bilateral lower extremity arterial occlusive disease Had vascular surgery evaluation -  was advised observation, he prefers to wait for arteriogram for now        Respiratory   COPD (chronic obstructive pulmonary disease) (HCC)    Quit smoking in 1999 On Symbicort and Albuterol Switched to [employer] due to insurance formulary, but prefers to use Symbicort - sent Symbicort Advised to comply with Symbicort to avoid frequent exacerbations Undergoing pulmonary rehab program - paperwork signed for [place] forever fit program      Relevant Medications    budesonide-formoterol (SYMBICORT) 160-4.5 MCG/ACT inhaler     Musculoskeletal and Integument   Skin tag    Has skin tags-1 each in bilateral axillae His itching is likely due to intertrigo-advised to use ketoconazole cream (he has old cream) Skin tags are benign and reassured that he does not need to remove them unless for cosmetic reasons        Genitourinary   Chronic kidney disease, stage 3b (HCC) - Primary    CMP reviewed GFR stable around 45 now Will monitor for now On ARB Advised for proper hydration Avoid nephrotoxic agents      Relevant Orders   CMP14+EGFR     Other   HLD (hyperlipidemia) (Chronic)    Used to follow up with lipid clinic Was on Praluent, had severe fatigue and rash with it -could not tolerate it Continue Vytorin for now Checked lipid profile - still has elevated LDL, on Repatha - unclear if he has technique issue, needs nursing education for administration Has statin intolerance      Relevant Orders   Lipid Profile   Anxiety (Chronic)    Takes Xanax PRN, for panic episodes Associated with previous incident with his wife (cardiac arrest) Symptoms better now Takes Ambien 10 mg QD for insomnia      Relevant Medications   ALPRAZolam (XANAX) 0.5 MG tablet   Insomnia (Chronic)    Takes Ambien as needed      Relevant Medications   zolpidem (AMBIEN) 10 MG tablet   Prediabetes    Lab Results  Component Value Date   HGBA1C 5.9 (H) 06/25/2023   Advised to continue to follow low carb diet      Relevant Orders   CMP14+EGFR   Other Visit Diagnoses     Vitamin D deficiency       Relevant Orders   Vitamin D (25 hydroxy)       Meds ordered this encounter  Medications   zolpidem (AMBIEN) 10 MG tablet    Sig: Take 1 tablet (10 mg total) by mouth at bedtime as needed. for sleep    Dispense:  30 tablet    Refill:  3   budesonide-formoterol (SYMBICORT) 160-4.5 MCG/ACT inhaler    Sig: Inhale 2 puffs into the lungs 2 (two) times daily.     Dispense:  1 each    Refill:  5   ALPRAZolam (XANAX) 0.5 MG tablet    Sig: Take 1  tablet (0.5 mg total) by mouth 2 (two) times daily as needed for anxiety.    Dispense:  30 tablet    Refill:  3    Follow-up: Return in about 4 months (around 10/30/2023) for HTN, CKD and HLD.    Anabel Halon, MD

## 2023-06-30 NOTE — Assessment & Plan Note (Addendum)
BP Readings from Last 1 Encounters:  06/30/23 128/71   Well-controlled with Hyzaar, Metoprolol and Imdur Counseled for compliance with the medications Advised DASH diet and moderate exercise/walking, at least 150 mins/week

## 2023-06-30 NOTE — Assessment & Plan Note (Signed)
Takes Ambien as needed 

## 2023-06-30 NOTE — Assessment & Plan Note (Signed)
Lab Results  Component Value Date   HGBA1C 5.9 (H) 06/25/2023   Advised to continue to follow low carb diet

## 2023-06-30 NOTE — Assessment & Plan Note (Addendum)
Quit smoking in 1999 On Symbicort and Albuterol Switched to [employer] due to insurance formulary, but prefers to use Symbicort - sent Symbicort Advised to comply with Symbicort to avoid frequent exacerbations Undergoing pulmonary rehab program - paperwork signed for [facility] health forever fit program

## 2023-06-30 NOTE — Assessment & Plan Note (Signed)
Has skin tags-1 each in bilateral axillae His itching is likely due to intertrigo-advised to use ketoconazole cream (he has old cream) Skin tags are benign and reassured that he does not need to remove them unless for cosmetic reasons

## 2023-06-30 NOTE — Patient Instructions (Signed)
Please start using Symbicort instead of Wixela.  Please continue to take medications as prescribed.  Please continue to follow low salt diet and perform moderate exercise/walking at least 150 mins/week.

## 2023-06-30 NOTE — Assessment & Plan Note (Signed)
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 Continue Aspirin and Vytorin Still has uncontrolled HLD, have added Repatha - unclear if he has technique issue, needs nursing education for administration Continue Metoprolol and Imdur

## 2023-07-07 ENCOUNTER — Telehealth: Payer: Self-pay | Admitting: Internal Medicine

## 2023-07-07 ENCOUNTER — Other Ambulatory Visit: Payer: Self-pay

## 2023-07-07 DIAGNOSIS — J42 Unspecified chronic bronchitis: Secondary | ICD-10-CM

## 2023-07-07 MED ORDER — BUDESONIDE-FORMOTEROL FUMARATE 160-4.5 MCG/ACT IN AERO: 2.0000 | INHALATION_SPRAY | Freq: Two times a day (BID) | RESPIRATORY_TRACT | 5 refills | Status: DC

## 2023-07-07 NOTE — Telephone Encounter (Signed)
Sent to [facility]

## 2023-07-07 NOTE — Telephone Encounter (Signed)
Patient came by the office said the pharmacy has not yet received this medicine  budesonide-formoterol (SYMBICORT) 160-4.5 MCG/ACT inhaler [[id number]]   Pharmacy  [id number] - [place], [place] - [address], [place] [address] Phone: [phone]  Fax: [phone]

## 2023-07-27 ENCOUNTER — Other Ambulatory Visit: Payer: Self-pay | Admitting: Internal Medicine

## 2023-07-27 DIAGNOSIS — E785 Hyperlipidemia, unspecified: Secondary | ICD-10-CM

## 2023-07-27 DIAGNOSIS — I25118 Atherosclerotic heart disease of native coronary artery with other forms of angina pectoris: Secondary | ICD-10-CM

## 2023-07-27 DIAGNOSIS — I739 Peripheral vascular disease, unspecified: Secondary | ICD-10-CM

## 2023-08-09 DIAGNOSIS — Z23 Encounter for immunization: Secondary | ICD-10-CM | POA: Diagnosis not present

## 2023-09-07 ENCOUNTER — Telehealth: Payer: Self-pay | Admitting: Cardiology

## 2023-09-07 ENCOUNTER — Other Ambulatory Visit: Payer: Self-pay

## 2023-09-07 ENCOUNTER — Ambulatory Visit
Admission: EM | Admit: 2023-09-07 | Discharge: 2023-09-07 | Disposition: A | Discharge status: Home / Self Care | Payer: Medicare Other

## 2023-09-07 DIAGNOSIS — R0602 Shortness of breath: Secondary | ICD-10-CM

## 2023-09-07 DIAGNOSIS — R002 Palpitations: Secondary | ICD-10-CM

## 2023-09-07 DIAGNOSIS — R5383 Other fatigue: Secondary | ICD-10-CM | POA: Diagnosis not present

## 2023-09-07 NOTE — Discharge Instructions (Signed)
If your symptoms worsen, go to the emergency department for further evaluation.  Call your cardiologist and see when the soonest available appointment is.

## 2023-09-07 NOTE — Telephone Encounter (Signed)
Dr.McDowell's response is noted,will proceed according to ER recommendations.

## 2023-09-07 NOTE — Telephone Encounter (Signed)
Patient c/o Palpitations:  STAT if patient reporting lightheadedness, shortness of breath, or chest pain  How long have you had palpitations/irregular HR/ Afib? Are you having the symptoms now? This weekend;   Are you currently experiencing lightheadedness, SOB or CP? Not currently  Do you have a history of afib (atrial fibrillation) or irregular heart rhythm? Yes   Have you checked your BP or HR? (document readings if available): no   Are you experiencing any other symptoms? Tired, weakness, sleepy

## 2023-09-07 NOTE — ED Triage Notes (Addendum)
Pt c/o chest pain with a history of A-Fib, pt states he's has been feeling really rough the last 3 days with fatigue, tiredness, SOB high BP 205/105, headache.  Wife states she administered his Symbicort but It did not seem to help with SOB.

## 2023-09-07 NOTE — Telephone Encounter (Signed)
Patient states that for the last 3 months-at least- his heart rate is "off the graph" when doing cardiac rehab. He tells me his HR is usually 60-65 and he noted HR's in the 40's and 50's. He has not reported any fast heart rates. Over the weekend,he felt his pulse and noted some skipped beats. He had one elevated BP that he thought was inaccurate. He gives me these BP readings: 110/55,129/79,134/70,158/75,152/82,161/82.His HR ranged from 43-70   He also notes he has had to use his rescue inhaler a few times.At the last office visit (March 2024) he was instructed to increase his Imdur to 30 mg hs. He said he did for a while but then went back to 15 mg daily but says he does not know why he did that. I asked him if he has missed any medication doses,or taken any extra doses and he dose not believe he has. He is concerned that he feels skipped beats when checking his pulse and that he is has more fatigue.   He cancelled his May apt with Dr.McDowell and Dr.Hilty.

## 2023-09-09 NOTE — ED Provider Notes (Signed)
[facility]    CSN: [id number] Arrival date & time: 09/07/23  1215      History   Chief Complaint No chief complaint on file.   HPI Dennis [name] is a 72 y.o. male.   Patient presenting today with wife for evaluation of 3-day history of fatigue, shortness of breath, elevated blood pressures, headache and just overall not feeling right.  He states he has an extensive cardiac history to include atrial fibrillation and flutter, hypertension, hyperlipidemia, peripheral vascular disease, coronary artery disease status post coronary artery bypass graft and ablation for atrial flutter.  He also has a history of COPD, states typically his shortness of breath is improved with his Symbicort inhaler but the past few days it has not seemed to help.  Denies fever, chills, body aches, wheezing, dizziness, syncopal episodes, changes in medications routine or foods.  He states he was try to check his pulse in his wrist and felt like he might be in atrial fibrillation again based on this.  He called his cardiologist this morning who recommended he go to the urgent care for further evaluation.    Past Medical History:  Diagnosis Date   Atrial flutter (HCC)    Diagnosed by ECG August 2015   Colitis    COPD (chronic obstructive pulmonary disease) (HCC)    Coronary atherosclerosis of native coronary artery    Multivessel status post CABG   Essential hypertension    Hyperlipidemia    Insomnia     Patient Active Problem List   Diagnosis Date Noted   Skin tag 06/30/2023   Prediabetes 02/26/2023   PVD (peripheral vascular disease) with claudication (HCC) 03/13/2022   Bronchitis 12/30/2021   Onychomycosis 11/12/2021   Groin rash 07/16/2021   Chronic kidney disease, stage 3b (HCC) 01/02/2021   Benign prostatic hyperplasia with urinary frequency 09/25/2020   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   Anxiety 07/23/2020   Insomnia 07/23/2020   Sensorineural hearing loss (SNHL) of both ears  12/02/2018   PTSD (post-traumatic stress disorder) 11/10/2017   Tinnitus aurium, 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   Atrial flutter (HCC) 11/21/2016   COPD GOLD III with reversibility  10/01/2015   Hematochezia    Diverticulosis of colon without hemorrhage    Hemorrhoid    History of adenomatous polyp of colon 08/14/2015   Rectal bleeding 08/14/2015   Upper airway cough syndrome 08/13/2015   Dyspnea 08/13/2015   Chronic left hip pain 09/17/2014   HLD (hyperlipidemia) 04/08/2010   Essential hypertension, benign 04/08/2010   Coronary atherosclerosis of native coronary artery 04/08/2010   COPD (chronic obstructive pulmonary disease) (HCC) 04/08/2010    Past Surgical History:  Procedure Laterality Date   COLONOSCOPY  01/29/2005   Dr. Jena Gauss; rectal polyp s/p polypectomy, otherwise normal.  Pathology with tubulovillous adenoma.   COLONOSCOPY  02/06/2008   Dr. Jena Gauss; minimal internal hemorrhoids, diminutive polyp in the splenic flexure s/p cold biopsy removal, otherwise normal.  Pathology with benign polypoid colonic mucosa.   COLONOSCOPY WITH PROPOFOL N/A 08/30/2015   Procedure: COLONOSCOPY WITH PROPOFOL at cecum at 0814; withdrawal time=8 minutes;  Surgeon: Corbin Ade, MD; internal hemorrhoids-likely source of hematochezia, otherwise normal exam.  Repeat in 5 years.   COLONOSCOPY WITH PROPOFOL N/A 01/28/2021   Procedure: COLONOSCOPY WITH PROPOFOL;  Surgeon: Corbin Ade, MD;  Location: [facility];  Service: Endoscopy;  Laterality: N/A;  am appt   CORONARY ARTERY BYPASS GRAFT  2005   Dr. Zenaida Niece Trigt: LIMA to LAD, right radial to circumflex,  SVG to RCA   CORONARY ARTERY BYPASS GRAFT  10/16/2004   ELECTROPHYSIOLOGIC STUDY N/A 11/21/2016   Procedure: A-Flutter Ablation;  Surgeon: Marinus Maw, MD;  Location: [facility];  Service: Cardiovascular;  Laterality: N/A;   TEE WITHOUT CARDIOVERSION N/A 11/21/2016   Procedure: TRANSESOPHAGEAL ECHOCARDIOGRAM (TEE);  Surgeon: Thurmon Fair, MD;   Location: [facility] END[facility];  Service: Cardiovascular;  Laterality: N/A;       Home Medications    Prior to Admission medications   Medication Sig Start Date End Date Taking? Authorizing Provider  albuterol (VENTOLIN HFA) 108 (90 Base) MCG/ACT inhaler Inhale 2 puffs into the lungs every 4 (four) hours as needed for wheezing. 02/26/23   Anabel Halon, MD  ALPRAZolam Prudy Feeler) 0.5 MG tablet Take 1 tablet (0.5 mg total) by mouth 2 (two) times daily as needed for anxiety. 06/30/23   Anabel Halon, MD  aspirin EC 81 MG tablet Take 1 tablet (81 mg total) by mouth daily. Patient taking differently: Take 81 mg by mouth 2 (two) times a week. 12/31/16   Marinus Maw, MD  budesonide-formoterol [facility]) 160-4.5 MCG/ACT inhaler Inhale 2 puffs into the lungs 2 (two) times daily. 07/07/23   Anabel Halon, MD  Evolocumab (REPATHA) 140 MG/ML SOSY INJECT 1 ML INTO THE SKIN EVERY 14 (FOURTEEN) DAYS. 07/28/23   Anabel Halon, MD  fenofibrate 160 MG tablet TAKE 1 TABLET BY MOUTH EVERY DAY 06/01/23   Anabel Halon, MD  fish oil-omega-3 fatty acids 1000 MG capsule Take 1 g by mouth in the morning and at bedtime.    [provider]  isosorbide mononitrate (IMDUR) 30 MG 24 hr tablet Take 1 tablet (30 mg total) by mouth at bedtime. 02/06/23 02/01/24  Jonelle Sidle, MD  ketoconazole (NIZORAL) 2 % cream Apply 1 Application topically daily. 10/30/22   Anabel Halon, MD  levalbuterol Pauline Aus) 1.25 MG/3ML nebulizer solution Take 1.25 mg by nebulization every 6 (six) hours as needed for wheezing or shortness of breath. 02/26/23   Anabel Halon, MD  losartan-hydrochlorothiazide (HYZAAR) 100-25 MG tablet Take 1 tablet by mouth daily. 04/28/23   Anabel Halon, MD  metoprolol tartrate (LOPRESSOR) 50 MG tablet TAKE 1 TABLET BY MOUTH TWICE A DAY 05/27/23   Anabel Halon, MD  nitroGLYCERIN (NITROSTAT) 0.4 MG SL tablet PLACE 1 TABLET (0.4 MG TOTAL) UNDER THE TONGUE EVERY 5 (FIVE) MINUTES AS NEEDED. 06/09/22   Jonelle Sidle, MD  Respiratory Therapy Supplies (NEBULIZER/TUBING/MOUTHPIECE) KIT Nebulizer tubing and mouthpiece 12/24/21   Anabel Halon, MD  tamsulosin (FLOMAX) 0.4 MG CAPS capsule Take 1 capsule (0.4 mg total) by mouth daily. 06/09/22   Anabel Halon, MD  Vitamin D, Ergocalciferol, (DRISDOL) 1.25 MG (50000 UNIT) CAPS capsule TAKE 1 CAPSULE (50,000 UNITS TOTAL) BY MOUTH EVERY 7 (SEVEN) DAYS 05/12/23   Anabel Halon, MD  zolpidem (AMBIEN) 10 MG tablet Take 1 tablet (10 mg total) by mouth at bedtime as needed. for sleep 06/30/23   Anabel Halon, MD    Family History Family History  Problem Relation Age of Onset   Hypertension Mother    Alcohol abuse Mother    Alcohol abuse Father    Colon cancer Neg Hx     Social History Social History   Tobacco Use   Smoking status: Former    Current packs/day: 0.00    Average packs/day: 2.0 packs/day for 27.0 years (54.0 ttl pk-yrs)    Types: Cigarettes  Start date: 02/01/1971    Quit date: 01/31/1998    Years since quitting: 25.6   Smokeless tobacco: Current    Types: Chew  Vaping Use   Vaping status: Never Used  Substance Use Topics   Alcohol use: Yes    Alcohol/week: 0.0 standard drinks of alcohol    Comment: Drinks beer daily, typically 2-4 a day. Also 4 oz red wine an evening.   Drug use: No     Allergies   Ace inhibitors, Enalapril, Dexamethasone, Alirocumab, Clotrimazole-betamethasone, and Prednisone   Review of Systems Review of Systems PER HPI  Physical Exam Triage Vital Signs ED Triage Vitals  Encounter Vitals Group     BP 09/07/23 1220 127/77     Systolic BP Percentile --      Diastolic BP Percentile --      Pulse Rate 09/07/23 1220 72     Resp 09/07/23 1220 16     Temp 09/07/23 1220 98.2 F (36.8 C)     Temp Source 09/07/23 1220 Oral     SpO2 09/07/23 1220 92 %     Weight --      Height --      Head Circumference --      Peak Flow --      Pain Score 09/07/23 1228 0     Pain Loc --      Pain Education --       Exclude from Growth Chart --    No data found.  Updated Vital Signs BP 127/77 (BP Location: Right Arm)   Pulse 72   Temp 98.2 F (36.8 C) (Oral)   Resp 16   SpO2 92%   Visual Acuity Right Eye Distance:   Left Eye Distance:   Bilateral Distance:    Right Eye Near:   Left Eye Near:    Bilateral Near:     Physical Exam Vitals and nursing note reviewed.  Constitutional:      Appearance: Normal appearance.  HENT:     Head: Atraumatic.     Nose: Nose normal.     Mouth/Throat:     Mouth: Mucous membranes are moist.     Pharynx: Oropharynx is clear.  Eyes:     Extraocular Movements: Extraocular movements intact.     Conjunctiva/sclera: Conjunctivae normal.  Cardiovascular:     Rate and Rhythm: Normal rate and regular rhythm.     Heart sounds: Normal heart sounds.  Pulmonary:     Effort: Pulmonary effort is normal.     Breath sounds: No wheezing or rales.     Comments: Mildly decreased breath sounds throughout Abdominal:     General: Bowel sounds are normal.     Palpations: Abdomen is soft.  Musculoskeletal:        General: Normal range of motion.     Cervical back: Normal range of motion and neck supple.  Skin:    General: Skin is warm and dry.  Neurological:     General: No focal deficit present.     Mental Status: He is oriented to person, place, and time.     Cranial Nerves: No cranial nerve deficit.     Motor: No weakness.     Gait: Gait normal.  Psychiatric:        Mood and Affect: Mood normal.        Thought Content: Thought content normal.        Judgment: Judgment normal.      UC Treatments / Results  Labs (all labs ordered are listed, but only abnormal results are displayed) Labs Reviewed - No data to display  EKG   Radiology No results found.  Procedures Procedures (including critical care time)  Medications Ordered in UC Medications - No data to display  Initial Impression / Assessment and Plan / UC Course  I have reviewed the  triage vital signs and the nursing notes.  Pertinent labs & imaging results that were available during my care of the patient were reviewed by me and considered in my medical decision making (see chart for details).     Vital signs within normal limits today, he is well-appearing and in no acute distress.  His EKG shows normal sinus rhythm with PVCs, no new ST or T wave changes.  Reviewed options for further workup in this setting as well as the fact that we are unable to rule out emergent cardiac conditions in the setting.  Patient and wife would like to forego further workup at this time and continue to monitor symptoms conservatively at home.  They are agreeable to going to the emergency department if symptoms worsen and plan to follow-up with the cardiologist as soon as possible.  Discussed supportive home care and strict return precautions.  Final Clinical Impressions(s) / UC Diagnoses   Final diagnoses:  SOB (shortness of breath)  Other fatigue  Palpitations     Discharge Instructions      If your symptoms worsen, go to the emergency department for further evaluation.  Call your cardiologist and see when the soonest available appointment is.    ED Prescriptions   None    PDMP not reviewed this encounter.   Particia Nearing, [place] 09/09/23 1657

## 2023-09-22 ENCOUNTER — Other Ambulatory Visit: Payer: Self-pay | Admitting: Internal Medicine

## 2023-09-22 DIAGNOSIS — E785 Hyperlipidemia, unspecified: Secondary | ICD-10-CM

## 2023-09-30 ENCOUNTER — Ambulatory Visit (INDEPENDENT_AMBULATORY_CARE_PROVIDER_SITE_OTHER): Payer: Medicare Other | Admitting: Internal Medicine

## 2023-09-30 ENCOUNTER — Encounter: Payer: Self-pay | Admitting: Internal Medicine

## 2023-09-30 VITALS — BP 125/75 | HR 72 | Ht 73.0 in | Wt 218.4 lb

## 2023-09-30 DIAGNOSIS — I25118 Atherosclerotic heart disease of native coronary artery with other forms of angina pectoris: Secondary | ICD-10-CM

## 2023-09-30 DIAGNOSIS — I483 Typical atrial flutter: Secondary | ICD-10-CM

## 2023-09-30 DIAGNOSIS — I739 Peripheral vascular disease, unspecified: Secondary | ICD-10-CM | POA: Diagnosis not present

## 2023-09-30 DIAGNOSIS — E782 Mixed hyperlipidemia: Secondary | ICD-10-CM | POA: Diagnosis not present

## 2023-09-30 MED ORDER — EZETIMIBE-SIMVASTATIN 10-40 MG PO TABS
1.0000 | ORAL_TABLET | Freq: Every day | ORAL | 1 refills | Status: DC
Start: 2023-09-30 — End: 2023-10-27

## 2023-09-30 MED ORDER — REPATHA SURECLICK 140 MG/ML ~~LOC~~ SOAJ
140.0000 mg | SUBCUTANEOUS | 1 refills | Status: DC
Start: 2023-09-30 — End: 2024-01-11

## 2023-09-30 NOTE — Progress Notes (Signed)
Established Patient Office Visit  Subjective:  Patient ID: Dennis [name], male    DOB: 1951/01/16  Age: 72 y.o. MRN: [id number]  CC:  Chief Complaint  Patient presents with   Palpitations    Heart palpitations three weeks ago, went to urgent care, had EKG done.   Fatigue    Fatigue / weakness in legs , wants ultrasound to check the blood flow in his veins     HPI Dennis [name] is a 72 y.o. male with past medical history of CAD s/p CABG, HTN, HLD, COPD, anxiety and insomnia who presents for f/u of recent urgent care visit on 09/07/23.  He went to urgent care for intermittent palpitations for the last 3 months.  He had a EKG done, which showed PVC.  Of note, he has history of atrial flutter, s/p cardiac ablation in the past.  He is currently taking metoprolol 50 mg twice daily.  Denies any chest pain currently.  He uses albuterol as needed for dyspnea, but denies recent worsening of dyspnea.  HLD: He is currently using Repatha prefilled syringes, but is agreeing to switch to [employer].  His lipid profile did not improve as expected with Repatha recently.  He used to take Vytorin in the past, but had stopped it due to leg cramps.  He states that he still has leg pain, likely due to PAD.  He wants to restart Vytorin now.  He reports recent worsening of leg pain upon walking.  He was evaluated by vascular surgeon in 2023 for PAD and was advised to have arteriography and possible intervention based on that.  He opted for observation at that time.  Due to recent worsening of leg pain, he agrees to have vascular surgery consultation again.  He is currently taking aspirin 81 mg every other day.  Has been history of recurrent GI bleeding with aspirin daily dosing.  Past Medical History:  Diagnosis Date   Atrial flutter (HCC)    Diagnosed by ECG August 2015   Colitis    COPD (chronic obstructive pulmonary disease) (HCC)    Coronary atherosclerosis of native coronary artery     Multivessel status post CABG   Essential hypertension    Hyperlipidemia    Insomnia     Past Surgical History:  Procedure Laterality Date   COLONOSCOPY  01/29/2005   Dr. Jena Gauss; rectal polyp s/p polypectomy, otherwise normal.  Pathology with tubulovillous adenoma.   COLONOSCOPY  02/06/2008   Dr. Jena Gauss; minimal internal hemorrhoids, diminutive polyp in the splenic flexure s/p cold biopsy removal, otherwise normal.  Pathology with benign polypoid colonic mucosa.   COLONOSCOPY WITH PROPOFOL N/A 08/30/2015   Procedure: COLONOSCOPY WITH PROPOFOL at cecum at 0814; withdrawal time=8 minutes;  Surgeon: Corbin Ade, MD; internal hemorrhoids-likely source of hematochezia, otherwise normal exam.  Repeat in 5 years.   COLONOSCOPY WITH PROPOFOL N/A 01/28/2021   Procedure: COLONOSCOPY WITH PROPOFOL;  Surgeon: Corbin Ade, MD;  Location: [facility];  Service: Endoscopy;  Laterality: N/A;  am appt   CORONARY ARTERY BYPASS GRAFT  2005   Dr. Zenaida Niece Trigt: LIMA to LAD, right radial to circumflex, SVG to RCA   CORONARY ARTERY BYPASS GRAFT  10/16/2004   ELECTROPHYSIOLOGIC STUDY N/A 11/21/2016   Procedure: A-Flutter Ablation;  Surgeon: Marinus Maw, MD;  Location: [facility];  Service: Cardiovascular;  Laterality: N/A;   TEE WITHOUT CARDIOVERSION N/A 11/21/2016   Procedure: TRANSESOPHAGEAL ECHOCARDIOGRAM (TEE);  Surgeon: Thurmon Fair, MD;  Location: [facility];  Service: Cardiovascular;  Laterality: N/A;    Family History  Problem Relation Age of Onset   Hypertension Mother    Alcohol abuse Mother    Alcohol abuse Father    Colon cancer Neg Hx     Social History   Socioeconomic History   Marital status: Married    Spouse name: Not on file   Number of children: 3   Years of education: Not on file   Highest education level: Not on file  Occupational History   Not on file  Tobacco Use   Smoking status: Former    Current packs/day: 0.00    Average packs/day: 2.0 packs/day for  27.0 years (54.0 ttl pk-yrs)    Types: Cigarettes    Start date: 02/01/1971    Quit date: 01/31/1998    Years since quitting: 25.6   Smokeless tobacco: Current    Types: Chew  Vaping Use   Vaping status: Never Used  Substance and Sexual Activity   Alcohol use: Yes    Alcohol/week: 0.0 standard drinks of alcohol    Comment: Drinks beer daily, typically 2-4 a day. Also 4 oz red wine an evening.   Drug use: No   Sexual activity: Not on file  Other Topics Concern   Not on file  Social History Narrative   Lives with wife, Victorino Dike   Social Determinants of Health   Financial Resource Strain: Medium Risk (08/05/2021)   Overall Financial Resource Strain (CARDIA)    Difficulty of Paying Living Expenses: Somewhat hard  Food Insecurity: No Food Insecurity (08/05/2021)   Hunger Vital Sign    Worried About Running Out of Food in the Last Year: Never true    Ran Out of Food in the Last Year: Never true  Transportation Needs: No Transportation Needs (08/05/2021)   PRAPARE - Administrator, Civil Service (Medical): No    Lack of Transportation (Non-Medical): No  Physical Activity: Insufficiently Active (08/05/2021)   Exercise Vital Sign    Days of Exercise per Week: 3 days    Minutes of Exercise per Session: 30 min  Stress: No Stress Concern Present (08/05/2021)   [employer] of Occupational Health - Occupational Stress Questionnaire    Feeling of Stress : Only a little  Social Connections: Moderately Isolated (08/05/2021)   Social Connection and Isolation Panel [NHANES]    Frequency of Communication with Friends and Family: More than three times a week    Frequency of Social Gatherings with Friends and Family: More than three times a week    Attends Religious Services: Never    Database administrator or Organizations: No    Attends Banker Meetings: Never    Marital Status: Married  Catering manager Violence: Not At Risk (08/05/2021)   Humiliation, Afraid, Rape,  and Kick questionnaire    Fear of Current or Ex-Partner: No    Emotionally Abused: No    Physically Abused: No    Sexually Abused: No    Outpatient Medications Prior to Visit  Medication Sig Dispense Refill   omega-3 acid ethyl esters (LOVAZA) 1 g capsule Take by mouth.     albuterol (VENTOLIN HFA) 108 (90 Base) MCG/ACT inhaler Inhale 2 puffs into the lungs every 4 (four) hours as needed for wheezing. 18 g 5   ALPRAZolam (XANAX) 0.5 MG tablet Take 1 tablet (0.5 mg total) by mouth 2 (two) times daily as needed for anxiety. 30 tablet 3   aspirin  EC 81 MG tablet Take 1 tablet (81 mg total) by mouth daily. (Patient taking differently: Take 81 mg by mouth 2 (two) times a week.) 30 tablet 11   budesonide-formoterol (SYMBICORT) 160-4.5 MCG/ACT inhaler Inhale 2 puffs into the lungs 2 (two) times daily. 1 each 5   fenofibrate 160 MG tablet TAKE 1 TABLET BY MOUTH EVERY DAY 90 tablet 1   fish oil-omega-3 fatty acids 1000 MG capsule Take 1 g by mouth in the morning and at bedtime.     isosorbide mononitrate (IMDUR) 30 MG 24 hr tablet Take 1 tablet (30 mg total) by mouth at bedtime. 90 tablet 3   ketoconazole (NIZORAL) 2 % cream Apply 1 Application topically daily. 30 g 0   levalbuterol (XOPENEX) 1.25 MG/3ML nebulizer solution Take 1.25 mg by nebulization every 6 (six) hours as needed for wheezing or shortness of breath. 360 mL 1   losartan-hydrochlorothiazide (HYZAAR) 100-25 MG tablet Take 1 tablet by mouth daily. 90 tablet 1   metoprolol tartrate (LOPRESSOR) 50 MG tablet TAKE 1 TABLET BY MOUTH TWICE A DAY 180 tablet 1   nitroGLYCERIN (NITROSTAT) 0.4 MG SL tablet PLACE 1 TABLET (0.4 MG TOTAL) UNDER THE TONGUE EVERY 5 (FIVE) MINUTES AS NEEDED. 25 tablet 3   Respiratory Therapy Supplies (NEBULIZER/TUBING/MOUTHPIECE) KIT Nebulizer tubing and mouthpiece 1 kit 0   tamsulosin (FLOMAX) 0.4 MG CAPS capsule Take 1 capsule (0.4 mg total) by mouth daily. 90 capsule 3   Vitamin D, Ergocalciferol, (DRISDOL) 1.25 MG  (50000 UNIT) CAPS capsule TAKE 1 CAPSULE (50,000 UNITS TOTAL) BY MOUTH EVERY 7 (SEVEN) DAYS 12 capsule 1   zolpidem (AMBIEN) 10 MG tablet Take 1 tablet (10 mg total) by mouth at bedtime as needed. for sleep 30 tablet 3   Evolocumab (REPATHA) 140 MG/ML SOSY INJECT 1 ML INTO THE SKIN EVERY 14 (FOURTEEN) DAYS. 2 mL 2   No facility-administered medications prior to visit.    Allergies  Allergen Reactions   Ace Inhibitors Shortness Of Breath and Cough   Enalapril Cough    Pt has been switched to Valsartan and is tolerating the different class.   Dexamethasone Itching    Pt was prescribed this in december   Alirocumab Rash and Other (See Comments)    blisters   Clotrimazole-Betamethasone Rash   Prednisone Rash    ROS Review of Systems  Constitutional:  Negative for chills and fever.  HENT:  Negative for congestion and sore throat.   Eyes:  Negative for pain and discharge.  Respiratory:  Negative for cough and shortness of breath.   Cardiovascular:  Negative for chest pain and palpitations.  Gastrointestinal:  Negative for diarrhea, nausea and vomiting.  Endocrine: Negative for polydipsia and polyuria.  Genitourinary:  Negative for dysuria, flank pain and hematuria.       Nocturia  Musculoskeletal:  Negative for neck pain and neck stiffness.       Leg pain  Skin:  Positive for rash.       Skin tag in axillae  Neurological:  Negative for dizziness, weakness, numbness and headaches.  Psychiatric/Behavioral:  Positive for sleep disturbance. Negative for agitation, behavioral problems, dysphoric mood and suicidal ideas. The patient is nervous/anxious.       Objective:    Physical Exam Vitals reviewed.  Constitutional:      General: He is not in acute distress.    Appearance: He is not diaphoretic.  HENT:     Head: Normocephalic and atraumatic.     Nose: Nose normal.  Mouth/Throat:     Mouth: Mucous membranes are moist.  Eyes:     General: No scleral icterus.     Extraocular Movements: Extraocular movements intact.  Cardiovascular:     Rate and Rhythm: Normal rate and regular rhythm.     Heart sounds: Normal heart sounds. No murmur heard. Pulmonary:     Breath sounds: Normal breath sounds. No wheezing or rales.  Musculoskeletal:     Cervical back: Neck supple. No tenderness.     Right lower leg: No edema.     Left lower leg: No edema.  Skin:    General: Skin is warm.     Findings: No rash.     Comments: Skin tags in bilateral axillae  Neurological:     General: No focal deficit present.     Mental Status: He is alert and oriented to person, place, and time.     Motor: Weakness (Bilateral LE-4/5) present.  Psychiatric:        Mood and Affect: Mood normal.        Behavior: Behavior normal.     BP 125/75 (BP Location: Left Arm, Patient Position: Sitting, Cuff Size: Large)   Pulse 72   Ht 6\' 1"  (1.854 m)   Wt 218 lb 6.4 oz (99.1 kg)   SpO2 94%   BMI 28.81 kg/m  Wt Readings from Last 3 Encounters:  09/30/23 218 lb 6.4 oz (99.1 kg)  06/30/23 212 lb 3.2 oz (96.3 kg)  02/26/23 216 lb 9.6 oz (98.2 kg)    Lab Results  Component Value Date   TSH 4.470 02/03/2023   Lab Results  Component Value Date   WBC 6.7 06/25/2023   HGB 13.8 06/25/2023   HCT 42.0 06/25/2023   MCV 91 06/25/2023   PLT 155 06/25/2023   Lab Results  Component Value Date   NA 135 06/25/2023   K 4.6 06/25/2023   CO2 25 06/25/2023   GLUCOSE 99 06/25/2023   BUN 23 06/25/2023   CREATININE 1.57 (H) 06/25/2023   BILITOT 0.5 06/25/2023   ALKPHOS 26 (L) 06/25/2023   AST 19 06/25/2023   ALT 16 06/25/2023   PROT 6.6 06/25/2023   ALBUMIN 4.3 06/25/2023   CALCIUM 9.6 06/25/2023   ANIONGAP 6 03/25/2020   EGFR 47 (L) 06/25/2023   GFR 60.41 08/13/2015   Lab Results  Component Value Date   CHOL 203 (H) 06/25/2023   Lab Results  Component Value Date   HDL 47 06/25/2023   Lab Results  Component Value Date   LDLCALC 114 (H) 06/25/2023   Lab Results  Component  Value Date   TRIG 243 (H) 06/25/2023   Lab Results  Component Value Date   CHOLHDL 4.3 06/25/2023   Lab Results  Component Value Date   HGBA1C 5.9 (H) 06/25/2023      Assessment & Plan:   Problem List Items Addressed This Visit       Cardiovascular and Mediastinum   Coronary atherosclerosis of native coronary artery    S/p CABG in the past Recent nuclear study showed reversible inferior wall ischemia with LVEF 57%. Continue Aspirin and Vytorin Still has uncontrolled HLD, have added Repatha Continue Metoprolol and Imdur      Relevant Medications   omega-3 acid ethyl esters (LOVAZA) 1 g capsule   Evolocumab (REPATHA SURECLICK) 140 MG/ML SOAJ   ezetimibe-simvastatin (VYTORIN) 10-40 MG tablet   Atrial flutter (HCC) - Primary    S/p cardiac ablation In sinus rhythm currently On Metoprolol  Followed by cardiology Last EKG showed sinus rhythm with PVC. Will contact cardiology to ask about ZIO patch monitor      Relevant Medications   omega-3 acid ethyl esters (LOVAZA) 1 g capsule   Evolocumab (REPATHA SURECLICK) 140 MG/ML SOAJ   ezetimibe-simvastatin (VYTORIN) 10-40 MG tablet   PAD (peripheral artery disease) (HCC)    Has leg claudication symptoms Was on aspirin in the past, but had recurrent GI bleeding with it - continue QOD for now On Repatha [place] ABI showed - moderate bilateral lower extremity arterial occlusive disease Had vascular surgery evaluation -  was advised observation, but since he has recent worsening of symptoms advised to get repeat consultation for considering arteriogram      Relevant Medications   omega-3 acid ethyl esters (LOVAZA) 1 g capsule   Evolocumab (REPATHA SURECLICK) 140 MG/ML SOAJ   ezetimibe-simvastatin (VYTORIN) 10-40 MG tablet   Other Relevant Orders   Ambulatory referral to Vascular Surgery     Other   HLD (hyperlipidemia) (Chronic)    Used to follow up with lipid clinic Was on Praluent, had severe fatigue and rash with it -could  not tolerate it Restart Vytorin for now Checked lipid profile - still has elevated LDL, on Repatha - unclear if he has technique issue, changed to Repatha SureClick Has h/o statin intolerance      Relevant Medications   omega-3 acid ethyl esters (LOVAZA) 1 g capsule   Evolocumab (REPATHA SURECLICK) 140 MG/ML SOAJ   ezetimibe-simvastatin (VYTORIN) 10-40 MG tablet    Meds ordered this encounter  Medications   Evolocumab (REPATHA SURECLICK) 140 MG/ML SOAJ    Sig: Inject 140 mg into the skin every 14 (fourteen) days.    Dispense:  6 mL    Refill:  1   ezetimibe-simvastatin (VYTORIN) 10-40 MG tablet    Sig: Take 1 tablet by mouth daily.    Dispense:  90 tablet    Refill:  1    Follow-up: No follow-ups on file.    Anabel Halon, MD

## 2023-09-30 NOTE — Assessment & Plan Note (Signed)
S/p cardiac ablation In sinus rhythm currently On Metoprolol Followed by cardiology Last EKG showed sinus rhythm with PVC. Will contact cardiology to ask about ZIO patch monitor

## 2023-09-30 NOTE — Assessment & Plan Note (Signed)
Has leg claudication symptoms Was on aspirin in the past, but had recurrent GI bleeding with it - continue QOD for now On Repatha [place] ABI showed - moderate bilateral lower extremity arterial occlusive disease Had vascular surgery evaluation -  was advised observation, but since he has recent worsening of symptoms advised to get repeat consultation for considering arteriogram

## 2023-09-30 NOTE — Assessment & Plan Note (Signed)
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 Continue Aspirin and Vytorin Still has uncontrolled HLD, have added Repatha Continue Metoprolol and Imdur

## 2023-09-30 NOTE — Assessment & Plan Note (Signed)
Used to follow up with lipid clinic Was on Praluent, had severe fatigue and rash with it -could not tolerate it Restart Vytorin for now Checked lipid profile - still has elevated LDL, on Repatha - unclear if he has technique issue, changed to Repatha SureClick Has h/o statin intolerance

## 2023-09-30 NOTE — Patient Instructions (Addendum)
Please start using Repatha SureClick instead of Autoinjector.  Please start taking Vytorin as prescribed.  You are being referred to Vascular surgery for evaluation of peripheral arterial disease.

## 2023-10-01 ENCOUNTER — Ambulatory Visit: Payer: Medicare Other

## 2023-10-01 ENCOUNTER — Other Ambulatory Visit: Payer: Self-pay | Admitting: Cardiology

## 2023-10-01 DIAGNOSIS — R002 Palpitations: Secondary | ICD-10-CM

## 2023-10-06 ENCOUNTER — Other Ambulatory Visit: Payer: Self-pay | Admitting: Internal Medicine

## 2023-10-15 DIAGNOSIS — R002 Palpitations: Secondary | ICD-10-CM | POA: Diagnosis not present

## 2023-10-16 ENCOUNTER — Ambulatory Visit: Payer: Medicare Other | Attending: Cardiology

## 2023-10-26 ENCOUNTER — Telehealth: Payer: Self-pay | Admitting: Cardiology

## 2023-10-26 ENCOUNTER — Other Ambulatory Visit: Payer: Self-pay

## 2023-10-26 DIAGNOSIS — I739 Peripheral vascular disease, unspecified: Secondary | ICD-10-CM

## 2023-10-26 NOTE — Telephone Encounter (Signed)
  Patient is calling in to find out the results of his heart monitor. Please advise.

## 2023-10-26 NOTE — Telephone Encounter (Signed)
Patient notified that results are not ready yet, but should be available soon. Pt verbalized understanding.

## 2023-10-27 ENCOUNTER — Ambulatory Visit (INDEPENDENT_AMBULATORY_CARE_PROVIDER_SITE_OTHER): Payer: Medicare Other

## 2023-10-27 ENCOUNTER — Ambulatory Visit (INDEPENDENT_AMBULATORY_CARE_PROVIDER_SITE_OTHER): Payer: Medicare Other | Admitting: Vascular Surgery

## 2023-10-27 ENCOUNTER — Encounter: Payer: Self-pay | Admitting: Vascular Surgery

## 2023-10-27 VITALS — BP 163/89 | HR 65 | Ht 73.0 in | Wt 215.0 lb

## 2023-10-27 DIAGNOSIS — I739 Peripheral vascular disease, unspecified: Secondary | ICD-10-CM

## 2023-10-27 LAB — VAS US ABI WITH/WO TBI
Left ABI: 0.69
Right ABI: 0.5

## 2023-10-27 NOTE — Progress Notes (Signed)
Patient name: Dennis [name] MRN: [id number] DOB: 1951-01-13 Sex: male  REASON FOR VISIT: PAD  HPI: Dennis [name] is a 72 y.o. male with history of atrial flutter, COPD, hypertension, hyperlipidemia that presents for follow-up of his claudication.  Previously seen by Dr. [name] on 08/06/2022 for bilateral lower extremity claudication.  This has been progressive over number of years.  At the time he had an ABI of 0.67 on the right and 0.69 on the left.  Today he states his legs are doing worse.  His wife states he cannot walk 75 feet without stopping due to severe leg cramping.  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  He has tried to put off intervention if at all possible.  No rest pain or tissue loss.  He states both legs are equally bad.  Past Medical History:  Diagnosis Date   Atrial flutter (HCC)    Diagnosed by ECG August 2015   Colitis    COPD (chronic obstructive pulmonary disease) (HCC)    Coronary atherosclerosis of native coronary artery    Multivessel status post CABG   Essential hypertension    Hyperlipidemia    Insomnia     Past Surgical History:  Procedure Laterality Date   COLONOSCOPY  01/29/2005   Dr. Jena Gauss; rectal polyp s/p polypectomy, otherwise normal.  Pathology with tubulovillous adenoma.   COLONOSCOPY  02/06/2008   Dr. Jena Gauss; minimal internal hemorrhoids, diminutive polyp in the splenic flexure s/p cold biopsy removal, otherwise normal.  Pathology with benign polypoid colonic mucosa.   COLONOSCOPY WITH PROPOFOL N/A 08/30/2015   Procedure: COLONOSCOPY WITH PROPOFOL at cecum at 0814; withdrawal time=8 minutes;  Surgeon: Corbin Ade, MD; internal hemorrhoids-likely source of hematochezia, otherwise normal exam.  Repeat in 5 years.   COLONOSCOPY WITH PROPOFOL N/A 01/28/2021   Procedure: COLONOSCOPY WITH PROPOFOL;  Surgeon: Corbin Ade, MD;  Location: [facility];   Service: Endoscopy;  Laterality: N/A;  am appt   CORONARY ARTERY BYPASS GRAFT  2005   Dr. Zenaida Niece Trigt: LIMA to LAD, right radial to circumflex, SVG to RCA   CORONARY ARTERY BYPASS GRAFT  10/16/2004   ELECTROPHYSIOLOGIC STUDY N/A 11/21/2016   Procedure: A-Flutter Ablation;  Surgeon: Marinus Maw, MD;  Location: [facility];  Service: Cardiovascular;  Laterality: N/A;   TEE WITHOUT CARDIOVERSION N/A 11/21/2016   Procedure: TRANSESOPHAGEAL ECHOCARDIOGRAM (TEE);  Surgeon: Thurmon Fair, MD;  Location[facility] END[facility]COPY;  Service: Cardiovascular;  Laterality: N/A;    Family History  Problem Relation Age of Onset   Hypertension Mother    Alcohol abuse Mother    Alcohol abuse Father    Colon cancer Neg Hx     SOCIAL HISTORY: Social History   Tobacco Use   Smoking status: Former    Current packs/day: 0.00    Average packs/day: 2.0 packs/day for 27.0 years (54.0 ttl pk-yrs)    Types: Cigarettes    Start date: 02/01/1971    Quit date: 01/31/1998    Years since quitting: 25.7   Smokeless tobacco: Current    Types: Chew  Substance Use Topics   Alcohol use: Yes    Alcohol/week: 0.0 standard drinks of alcohol    Comment: Drinks beer daily, typically 2-4 a day. Also 4 oz red wine an evening.    Allergies  Allergen Reactions   Ace Inhibitors Shortness Of Breath and Cough  Enalapril Cough    Pt has been switched to Valsartan and is tolerating the different class.   Dexamethasone Itching    Pt was prescribed this in december   Alirocumab Rash and Other (See Comments)    blisters   Clotrimazole-Betamethasone Rash   Prednisone Rash    Current Outpatient Medications  Medication Sig Dispense Refill   albuterol (VENTOLIN HFA) 108 (90 Base) MCG/ACT inhaler Inhale 2 puffs into the lungs every 4 (four) hours as needed for wheezing. 18 g 5   ALPRAZolam (XANAX) 0.5 MG tablet Take 1 tablet (0.5 mg total) by mouth 2 (two) times daily as needed for anxiety. 30 tablet 3   aspirin EC 81 MG  tablet Take 1 tablet (81 mg total) by mouth daily. (Patient taking differently: Take 81 mg by mouth 2 (two) times a week.) 30 tablet 11   budesonide-formoterol (SYMBICORT) 160-4.5 MCG/ACT inhaler Inhale 2 puffs into the lungs 2 (two) times daily. 1 each 5   Evolocumab (REPATHA SURECLICK) 140 MG/ML SOAJ Inject 140 mg into the skin every 14 (fourteen) days. 6 mL 1   ezetimibe-simvastatin (VYTORIN) 10-40 MG tablet Take 1 tablet by mouth daily. 90 tablet 1   fenofibrate 160 MG tablet TAKE 1 TABLET BY MOUTH EVERY DAY 90 tablet 1   fish oil-omega-3 fatty acids 1000 MG capsule Take 1 g by mouth in the morning and at bedtime.     isosorbide mononitrate (IMDUR) 30 MG 24 hr tablet Take 1 tablet (30 mg total) by mouth at bedtime. 90 tablet 3   ketoconazole (NIZORAL) 2 % cream Apply 1 Application topically daily. 30 g 0   levalbuterol (XOPENEX) 1.25 MG/3ML nebulizer solution Take 1.25 mg by nebulization every 6 (six) hours as needed for wheezing or shortness of breath. 360 mL 1   losartan-hydrochlorothiazide (HYZAAR) 100-25 MG tablet Take 1 tablet by mouth daily. 90 tablet 1   metoprolol tartrate (LOPRESSOR) 50 MG tablet TAKE 1 TABLET BY MOUTH TWICE A DAY 180 tablet 1   nitroGLYCERIN (NITROSTAT) 0.4 MG SL tablet PLACE 1 TABLET (0.4 MG TOTAL) UNDER THE TONGUE EVERY 5 (FIVE) MINUTES AS NEEDED. 25 tablet 3   omega-3 acid ethyl esters (LOVAZA) 1 g capsule Take by mouth.     Respiratory Therapy Supplies (NEBULIZER/TUBING/MOUTHPIECE) KIT Nebulizer tubing and mouthpiece 1 kit 0   tamsulosin (FLOMAX) 0.4 MG CAPS capsule Take 1 capsule (0.4 mg total) by mouth daily. 90 capsule 3   Vitamin D, Ergocalciferol, (DRISDOL) 1.25 MG (50000 UNIT) CAPS capsule TAKE 1 CAPSULE (50,000 UNITS TOTAL) BY MOUTH EVERY 7 (SEVEN) DAYS 12 capsule 1   zolpidem (AMBIEN) 10 MG tablet Take 1 tablet (10 mg total) by mouth at bedtime as needed. for sleep 30 tablet 3   No current facility-administered medications for this visit.    REVIEW OF  SYSTEMS:  [X]  denotes positive finding, [ ]  denotes negative finding Cardiac  Comments:  Chest pain or chest pressure:    Shortness of breath upon exertion:    Short of breath when lying flat:    Irregular heart rhythm:        Vascular    Pain in calf, thigh, or hip brought on by ambulation: x   Pain in feet at night that wakes you up from your sleep:     Blood clot in your veins:    Leg swelling:         Pulmonary    Oxygen at home:    Productive cough:  Wheezing:         Neurologic    Sudden weakness in arms or legs:     Sudden numbness in arms or legs:     Sudden onset of difficulty speaking or slurred speech:    Temporary loss of vision in one eye:     Problems with dizziness:         Gastrointestinal    Blood in stool:     Vomited blood:         Genitourinary    Burning when urinating:     Blood in urine:        Psychiatric    Major depression:         Hematologic    Bleeding problems:    Problems with blood clotting too easily:        Skin    Rashes or ulcers:        Constitutional    Fever or chills:      PHYSICAL EXAM: There were no vitals filed for this visit.  GENERAL: The patient is a well-nourished male, in no acute distress. The vital signs are documented above. CARDIAC: There is a regular rate and rhythm.  VASCULAR:  Left femoral pulse 1+ palpable No palpable right femoral pulse No palpable pedal pulses No tissue loss PULMONARY: No respiratory distress ABDOMEN: Soft and non-tender. MUSCULOSKELETAL: There are no major deformities or cyanosis. NEUROLOGIC: No focal weakness or paresthesias are detected. SKIN: There are no ulcers or rashes noted. PSYCHIATRIC: The patient has a normal affect.  DATA:   ABI today 0.50 right and 0.69 left (previously 0.67 and 0.69)  Assessment/Plan:  72 y.o. male with history of atrial flutter, COPD, hypertension, hyperlipidemia that presents for follow-up of his lower extremity claudication.  Previously  seen by Dr. [name] on 08/06/2022 for bilateral lower extremity claudication.   His symptoms have been progressively worse over the last year.  I again discussed we should reserve intervention until his symptoms are disabling and lifestyle limiting.  He is really at a point where he is disabled by his legs.  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  His ABIs have dropped now 0.5 on the right and stable at 0.69 on the left.  I have offered him lower extremity arteriogram from a transfemoral approach.  This would have to be from the left groin as I cannot feel a right femoral pulse.  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  He would like to pursue intervention in January once he gets his cardiac issues sorted out with his cardiologist.  I discussed risk and benefits including risk of transfemoral access, vessel injury and bleeding.  All questions answered.  Will get scheduled through my office.  I discussed this being done under moderate sedation and if he has endovascular options we will proceed at that time otherwise will need open surgical intervention.   Cephus Shelling, MD [facility] Office: [phone]

## 2023-10-28 ENCOUNTER — Other Ambulatory Visit: Payer: Self-pay

## 2023-10-28 DIAGNOSIS — I739 Peripheral vascular disease, unspecified: Secondary | ICD-10-CM

## 2023-10-29 ENCOUNTER — Other Ambulatory Visit: Payer: Self-pay | Admitting: Internal Medicine

## 2023-10-29 DIAGNOSIS — G4700 Insomnia, unspecified: Secondary | ICD-10-CM

## 2023-10-29 NOTE — Telephone Encounter (Signed)
Patient was calling back for results. Informed him we still dont have them but as soon as dr gets thems and read it the nurse will give him a call. Please advise

## 2023-11-01 ENCOUNTER — Other Ambulatory Visit: Payer: Self-pay | Admitting: Internal Medicine

## 2023-11-01 DIAGNOSIS — I10 Essential (primary) hypertension: Secondary | ICD-10-CM

## 2023-11-01 DIAGNOSIS — F419 Anxiety disorder, unspecified: Secondary | ICD-10-CM

## 2023-11-02 ENCOUNTER — Ambulatory Visit: Payer: Medicare Other | Admitting: Internal Medicine

## 2023-11-03 DIAGNOSIS — E559 Vitamin D deficiency, unspecified: Secondary | ICD-10-CM | POA: Diagnosis not present

## 2023-11-03 DIAGNOSIS — E785 Hyperlipidemia, unspecified: Secondary | ICD-10-CM | POA: Diagnosis not present

## 2023-11-03 DIAGNOSIS — R7303 Prediabetes: Secondary | ICD-10-CM | POA: Diagnosis not present

## 2023-11-03 DIAGNOSIS — N1832 Chronic kidney disease, stage 3b: Secondary | ICD-10-CM | POA: Diagnosis not present

## 2023-11-03 NOTE — Telephone Encounter (Signed)
Received report from zio,sent to Dr.McDowell to read.

## 2023-11-03 NOTE — Telephone Encounter (Signed)
Dennis Sidle, MD  Results reviewed.  ZIO monitor sent to me today for the first time and reviewed by me today.  Please let him know that we did capture episodes of atrial fibrillation representing approximately 6% of his total rhythm during monitoring.  Longest episode lasted about 3 hours and 57 minutes.  He has a history of atrial flutter status post ablation in the past by Dr. Ladona Ridgel.  Biggest questions now are whether he is willing to consider initiation of anticoagulation for stroke prophylaxis (does have a history of bleeding on aspirin so we could try Eliquis alone) and also whether he would like to follow-up with Dr. Ladona Ridgel for discussion of antiarrhythmic therapy if he is having symptoms with atrial fibrillation.

## 2023-11-03 NOTE — Telephone Encounter (Signed)
Pt is in office today asking about the results of his ZIO monitor. He has contacted [place] several times asking about it and keeps being told someone will contact him. [phone] is the best number to reach the pt.

## 2023-11-03 NOTE — Telephone Encounter (Signed)
I have sent a message to zio rep

## 2023-11-03 NOTE — Telephone Encounter (Signed)
Left a message for patient to call office back .  

## 2023-11-04 LAB — CMP14+EGFR
ALT: 15 [IU]/L (ref 0–44)
AST: 21 [IU]/L (ref 0–40)
Albumin: 4 g/dL (ref 3.9–4.9)
Alkaline Phosphatase: 26 [IU]/L — ABNORMAL LOW (ref 44–121)
BUN/Creatinine Ratio: 16 (ref 10–24)
BUN: 32 mg/dL — ABNORMAL HIGH (ref 8–27)
Bilirubin Total: 0.5 mg/dL (ref 0.0–1.2)
CO2: 28 mmol/L (ref 20–29)
Calcium: 9.2 mg/dL (ref 8.6–10.2)
Chloride: 94 mmol/L — ABNORMAL LOW (ref 96–106)
Creatinine, Ser: 2.04 mg/dL — ABNORMAL HIGH (ref 0.76–1.27)
Globulin, Total: 2.3 g/dL (ref 1.5–4.5)
Glucose: 94 mg/dL (ref 70–99)
Potassium: 4.7 mmol/L (ref 3.5–5.2)
Sodium: 136 mmol/L (ref 134–144)
Total Protein: 6.3 g/dL (ref 6.0–8.5)
eGFR: 34 mL/min/{1.73_m2} — ABNORMAL LOW (ref >59)

## 2023-11-04 LAB — LIPID PANEL
Chol/HDL Ratio: 4.8 {ratio} (ref 0.0–5.0)
Cholesterol, Total: 226 mg/dL — ABNORMAL HIGH (ref 100–199)
HDL: 47 mg/dL (ref >39)
LDL Chol Calc (NIH): 142 mg/dL — ABNORMAL HIGH (ref 0–99)
Triglycerides: 203 mg/dL — ABNORMAL HIGH (ref 0–149)
VLDL Cholesterol Cal: 37 mg/dL (ref 5–40)

## 2023-11-04 LAB — VITAMIN D 25 HYDROXY (VIT D DEFICIENCY, FRACTURES): Vit D, 25-Hydroxy: 32.3 ng/mL (ref 30.0–100.0)

## 2023-11-06 ENCOUNTER — Encounter: Payer: Self-pay | Admitting: Internal Medicine

## 2023-11-11 ENCOUNTER — Ambulatory Visit (INDEPENDENT_AMBULATORY_CARE_PROVIDER_SITE_OTHER): Payer: Medicare Other | Admitting: Internal Medicine

## 2023-11-11 ENCOUNTER — Encounter: Payer: Self-pay | Admitting: Internal Medicine

## 2023-11-11 VITALS — BP 137/72 | HR 60 | Ht 73.0 in | Wt 217.0 lb

## 2023-11-11 DIAGNOSIS — I739 Peripheral vascular disease, unspecified: Secondary | ICD-10-CM | POA: Diagnosis not present

## 2023-11-11 DIAGNOSIS — E782 Mixed hyperlipidemia: Secondary | ICD-10-CM | POA: Diagnosis not present

## 2023-11-11 DIAGNOSIS — I480 Paroxysmal atrial fibrillation: Secondary | ICD-10-CM | POA: Diagnosis not present

## 2023-11-11 DIAGNOSIS — J42 Unspecified chronic bronchitis: Secondary | ICD-10-CM | POA: Diagnosis not present

## 2023-11-11 DIAGNOSIS — J9611 Chronic respiratory failure with hypoxia: Secondary | ICD-10-CM | POA: Diagnosis not present

## 2023-11-11 DIAGNOSIS — I10 Essential (primary) hypertension: Secondary | ICD-10-CM

## 2023-11-11 DIAGNOSIS — N1832 Chronic kidney disease, stage 3b: Secondary | ICD-10-CM

## 2023-11-11 DIAGNOSIS — Z125 Encounter for screening for malignant neoplasm of prostate: Secondary | ICD-10-CM

## 2023-11-11 DIAGNOSIS — R350 Frequency of micturition: Secondary | ICD-10-CM

## 2023-11-11 DIAGNOSIS — N401 Enlarged prostate with lower urinary tract symptoms: Secondary | ICD-10-CM | POA: Diagnosis not present

## 2023-11-11 DIAGNOSIS — R7303 Prediabetes: Secondary | ICD-10-CM | POA: Diagnosis not present

## 2023-11-11 HISTORY — DX: Peripheral vascular disease, unspecified: I73.9

## 2023-11-11 MED ORDER — LOSARTAN POTASSIUM-HCTZ 100-12.5 MG PO TABS
1.0000 | ORAL_TABLET | Freq: Every day | ORAL | 1 refills | Status: DC
Start: 2023-11-11 — End: 2024-02-29

## 2023-11-11 MED ORDER — AMLODIPINE BESYLATE 5 MG PO TABS
5.0000 mg | ORAL_TABLET | Freq: Every day | ORAL | 1 refills | Status: DC
Start: 2023-11-11 — End: 2024-02-29

## 2023-11-11 MED ORDER — LEVALBUTEROL HCL 1.25 MG/3ML IN NEBU
1.2500 mg | INHALATION_SOLUTION | Freq: Four times a day (QID) | RESPIRATORY_TRACT | 1 refills | Status: DC | PRN
Start: 2023-11-11 — End: 2023-11-24

## 2023-11-11 NOTE — Assessment & Plan Note (Signed)
Has leg claudication symptoms Was on aspirin in the past, but had recurrent GI bleeding with it - continue QOD for now On Repatha [place] ABI showed - moderate bilateral lower extremity arterial occlusive disease Had vascular surgery evaluation -  planned for arteriogram

## 2023-11-11 NOTE — Assessment & Plan Note (Addendum)
Quit smoking in 1999 On Symbicort and Albuterol Advised to comply with Symbicort to avoid frequent exacerbations Undergoing pulmonary rehab program - paperwork signed for [facility] health forever fit program

## 2023-11-11 NOTE — Patient Instructions (Signed)
Please start taking Losartan-hydrochlorothiazide 100-12.5 mg instead of 100-25 mg. Please start taking Amlodipine 5 mg as prescribed.  Please start taking Vytorin as prescribed.  Please use home oxygen at nighttime for oxygen levels less than 89%.  Please continue to take medications as prescribed.  Please continue to follow low salt diet and ambulate as tolerated.

## 2023-11-11 NOTE — Progress Notes (Unsigned)
Established Patient Office Visit  Subjective:  Patient ID: Dennis [name], male    DOB: [birth year]  Age: 72 y.o. MRN: [id number]  CC:  Chief Complaint  Patient presents with   Hypertension    Four month follow up    low oxygen    Low oxygen at night , dropping down in the 80s   Atrial Fibrillation    Follow up on recent symptoms     HPI Dennis [name] is a 72 y.o. male with past medical history of CAD s/p CABG, HTN, HLD, COPD, anxiety and insomnia who presents for f/u of his chronic medical conditions.  HTN: BP is well-controlled. He takes losartan/hydrochlorothiazide 100-25 mg QD and Imdur 30 mg QD currently.  Patient denies headache, dizziness or palpitations.  Atrial fibrillation: He has intermittent dyspnea and palpitations.  He recently had a cardiac monitor, which showed paroxysmal atrial fibrillation.  He has had cardiac ablation done in the past.  He currently takes metoprolol for history of atrial flutter.  He could not tolerate anticoagulation, had GI bleeding. He is going to see Dr. Ladona Ridgel on 11/17/23. He has oxygen desaturations especially at nighttime up to 80s.  O2 sat on room air: 92% O2 sat on ambulation: 86% O2 sat on ambulation with 2 LMP O2: 93%  CAD and PAD: He is placed on Imdur for episodes of chest pain, which has improved his symptoms. He complains of leg pain, worse with walking.  He denies any cold sensation or pallor of the feet.  He has had [place] ABI of LE in the past, which showed moderate bilateral lower extremity arterial occlusive disease.  He was seen by vascular surgeon, and was advised for arteriogram.  HLD: He has stopped taking Vytorin as he had leg pain. He is on Repatha now. His LDL is elevated to 142 now. He agrees to restart Vytorin.  CKD: His BMP showed stable GFR at 26. He agrees to decrease hydrochlorothiazide dose now.  Denies any dysuria, hematuria or urinary hesitancy or resistance.  He is trying to improve fluid  intake.  Anxiety/insomnia/PTSD: Currently well controlled with Xanax.  He gets intermittent episodes of severe anxiety spells, where he gets flushed and has mild dyspnea, but they have been less frequent recently.  He also takes Ambien for insomnia.  COPD:    Past Medical History:  Diagnosis Date   Atrial flutter (HCC)    Diagnosed by ECG August 2015   Colitis    COPD (chronic obstructive pulmonary disease) (HCC)    Coronary atherosclerosis of native coronary artery    Multivessel status post CABG   Essential hypertension    Hyperlipidemia    Insomnia     Past Surgical History:  Procedure Laterality Date   COLONOSCOPY  01/29/2005   Dr. Jena Gauss; rectal polyp s/p polypectomy, otherwise normal.  Pathology with tubulovillous adenoma.   COLONOSCOPY  02/06/2008   Dr. Jena Gauss; minimal internal hemorrhoids, diminutive polyp in the splenic flexure s/p cold biopsy removal, otherwise normal.  Pathology with benign polypoid colonic mucosa.   COLONOSCOPY WITH PROPOFOL N/A 08/30/2015   Procedure: COLONOSCOPY WITH PROPOFOL at cecum at 0814; withdrawal time=8 minutes;  Surgeon: Corbin Ade, MD; internal hemorrhoids-likely source of hematochezia, otherwise normal exam.  Repeat in 5 years.   COLONOSCOPY WITH PROPOFOL N/A 01/28/2021   Procedure: COLONOSCOPY WITH PROPOFOL;  Surgeon: Corbin Ade, MD;  Location: [facility];  Service: Endoscopy;  Laterality: N/A;  am appt   CORONARY ARTERY BYPASS GRAFT  2005   Dr. Zenaida Niece Trigt: LIMA to LAD, right radial to circumflex, SVG to RCA   CORONARY ARTERY BYPASS GRAFT  10/16/2004   ELECTROPHYSIOLOGIC STUDY N/A 11/21/2016   Procedure: A-Flutter Ablation;  Surgeon: Marinus Maw, MD;  Location: [facility] INVAS[facility] CV LAB;  Service: Cardiovascular;  Laterality: N/A;   TEE WITHOUT CARDIOVERSION N/A 11/21/2016   Procedure: TRANSESOPHAGEAL ECHOCARDIOGRAM (TEE);  Surgeon: Thurmon Fair, MD;  Location: [facility] ENDOSCOPY;  Service: Cardiovascular;  Laterality: N/A;    Family  History  Problem Relation Age of Onset   Hypertension Mother    Alcohol abuse Mother    Alcohol abuse Father    Colon cancer Neg Hx     Social History   Socioeconomic History   Marital status: Married    Spouse name: Not on file   Number of children: 3   Years of education: Not on file   Highest education level: Not on file  Occupational History   Not on file  Tobacco Use   Smoking status: Former    Current packs/day: 0.00    Average packs/day: 2.0 packs/day for 27.0 years (54.0 ttl pk-yrs)    Types: Cigarettes    Start date: 02/01/1971    Quit date: 01/31/1998    Years since quitting: 25.7   Smokeless tobacco: Current    Types: Chew  Vaping Use   Vaping status: Never Used  Substance and Sexual Activity   Alcohol use: Yes    Alcohol/week: 0.0 standard drinks of alcohol    Comment: Drinks beer daily, typically 2-4 a day. Also 4 oz red wine an evening.   Drug use: No   Sexual activity: Not on file  Other Topics Concern   Not on file  Social History Narrative   Lives with wife, Victorino Dike   Social Drivers of Health   Financial Resource Strain: Medium Risk (08/05/2021)   Overall Financial Resource Strain (CARDIA)    Difficulty of Paying Living Expenses: Somewhat hard  Food Insecurity: No Food Insecurity (08/05/2021)   Hunger Vital Sign    Worried About Running Out of Food in the Last Year: Never true    Ran Out of Food in the Last Year: Never true  Transportation Needs: No Transportation Needs (08/05/2021)   PRAPARE - Administrator, Civil Service (Medical): No    Lack of Transportation (Non-Medical): No  Physical Activity: Insufficiently Active (08/05/2021)   Exercise Vital Sign    Days of Exercise per Week: 3 days    Minutes of Exercise per Session: 30 min  Stress: No Stress Concern Present (08/05/2021)   [employer] of Occupational Health - Occupational Stress Questionnaire    Feeling of Stress : Only a little  Social Connections: Moderately  Isolated (08/05/2021)   Social Connection and Isolation Panel [NHANES]    Frequency of Communication with Friends and Family: More than three times a week    Frequency of Social Gatherings with Friends and Family: More than three times a week    Attends Religious Services: Never    Database administrator or Organizations: No    Attends Banker Meetings: Never    Marital Status: Married  Catering manager Violence: Not At Risk (08/05/2021)   Humiliation, Afraid, Rape, and Kick questionnaire    Fear of Current or Ex-Partner: No    Emotionally Abused: No    Physically Abused: No    Sexually Abused: No    Outpatient Medications Prior to Visit  Medication Sig Dispense  Refill   ezetimibe-simvastatin (VYTORIN) 10-40 MG tablet Take 1 tablet by mouth at bedtime.     albuterol (VENTOLIN HFA) 108 (90 Base) MCG/ACT inhaler Inhale 2 puffs into the lungs every 4 (four) hours as needed for wheezing. 18 g 5   ALPRAZolam (XANAX) 0.5 MG tablet TAKE 1 TABLET BY MOUTH 2 TIMES DAILY AS NEEDED FOR ANXIETY. 30 tablet 3   aspirin EC 81 MG tablet Take 1 tablet (81 mg total) by mouth daily. (Patient taking differently: Take 81 mg by mouth 2 (two) times a week.) 30 tablet 11   budesonide-formoterol (SYMBICORT) 160-4.5 MCG/ACT inhaler Inhale 2 puffs into the lungs 2 (two) times daily. 1 each 5   Evolocumab (REPATHA SURECLICK) 140 MG/ML SOAJ Inject 140 mg into the skin every 14 (fourteen) days. 6 mL 1   fenofibrate 160 MG tablet TAKE 1 TABLET BY MOUTH EVERY DAY 90 tablet 1   fish oil-omega-3 fatty acids 1000 MG capsule Take 1 g by mouth in the morning and at bedtime.     isosorbide mononitrate (IMDUR) 30 MG 24 hr tablet Take 1 tablet (30 mg total) by mouth at bedtime. 90 tablet 3   metoprolol tartrate (LOPRESSOR) 50 MG tablet TAKE 1 TABLET BY MOUTH TWICE A DAY 180 tablet 1   nitroGLYCERIN (NITROSTAT) 0.4 MG SL tablet PLACE 1 TABLET (0.4 MG TOTAL) UNDER THE TONGUE EVERY 5 (FIVE) MINUTES AS NEEDED. 25 tablet  3   omega-3 acid ethyl esters (LOVAZA) 1 g capsule Take by mouth.     Respiratory Therapy Supplies (NEBULIZER/TUBING/MOUTHPIECE) KIT Nebulizer tubing and mouthpiece 1 kit 0   Vitamin D, Ergocalciferol, (DRISDOL) 1.25 MG (50000 UNIT) CAPS capsule TAKE 1 CAPSULE (50,000 UNITS TOTAL) BY MOUTH EVERY 7 (SEVEN) DAYS 12 capsule 1   zolpidem (AMBIEN) 10 MG tablet TAKE 1 TABLET BY MOUTH AT BEDTIME AS NEEDED FOR SLEEP 30 tablet 3   levalbuterol (XOPENEX) 1.25 MG/3ML nebulizer solution Take 1.25 mg by nebulization every 6 (six) hours as needed for wheezing or shortness of breath. 360 mL 1   losartan-hydrochlorothiazide (HYZAAR) 100-25 MG tablet Take 1 tablet by mouth daily. 90 tablet 1   No facility-administered medications prior to visit.    Allergies  Allergen Reactions   Ace Inhibitors Shortness Of Breath and Cough   Enalapril Cough    Pt has been switched to Valsartan and is tolerating the different class.   Dexamethasone Itching    Pt was prescribed this in december   Alirocumab Rash and Other (See Comments)    blisters   Clotrimazole-Betamethasone Rash   Prednisone Rash    ROS Review of Systems  Constitutional:  Negative for chills and fever.  HENT:  Negative for congestion and sore throat.   Eyes:  Negative for pain and discharge.  Respiratory:  Positive for shortness of breath. Negative for cough.   Cardiovascular:  Positive for palpitations. Negative for chest pain.  Gastrointestinal:  Negative for diarrhea, nausea and vomiting.  Endocrine: Negative for polydipsia and polyuria.  Genitourinary:  Negative for dysuria, flank pain and hematuria.       Nocturia  Musculoskeletal:  Negative for neck pain and neck stiffness.       Leg pain  Skin:  Positive for rash.       Skin tag in axillae  Neurological:  Negative for dizziness and weakness.  Psychiatric/Behavioral:  Positive for sleep disturbance. Negative for agitation, behavioral problems, dysphoric mood and suicidal ideas. The  patient is nervous/anxious.  Objective:    Physical Exam Vitals reviewed.  Constitutional:      General: He is not in acute distress.    Appearance: He is not diaphoretic.  HENT:     Head: Normocephalic and atraumatic.     Nose: Nose normal.     Mouth/Throat:     Mouth: Mucous membranes are moist.  Eyes:     General: No scleral icterus.    Extraocular Movements: Extraocular movements intact.  Cardiovascular:     Rate and Rhythm: Normal rate and regular rhythm.     Heart sounds: Normal heart sounds. No murmur heard. Pulmonary:     Breath sounds: Normal breath sounds. No wheezing or rales.  Musculoskeletal:     Cervical back: Neck supple. No tenderness.     Right lower leg: No edema.     Left lower leg: No edema.  Skin:    General: Skin is warm.     Findings: No rash.     Comments: Skin tags in bilateral axillae  Neurological:     General: No focal deficit present.     Mental Status: He is alert and oriented to person, place, and time.     Motor: Weakness (Bilateral LE-4/5) present.  Psychiatric:        Mood and Affect: Mood normal.        Behavior: Behavior normal.     BP 137/72 (BP Location: Left Arm, Patient Position: Sitting, Cuff Size: Normal)   Pulse 60   Ht 6\' 1"  (1.854 m)   Wt 217 lb (98.4 kg)   SpO2 92%   BMI 28.63 kg/m  Wt Readings from Last 3 Encounters:  11/11/23 217 lb (98.4 kg)  10/27/23 215 lb (97.5 kg)  09/30/23 218 lb 6.4 oz (99.1 kg)    Lab Results  Component Value Date   TSH 4.470 02/03/2023   Lab Results  Component Value Date   WBC 6.7 06/25/2023   HGB 13.8 06/25/2023   HCT 42.0 06/25/2023   MCV 91 06/25/2023   PLT 155 06/25/2023   Lab Results  Component Value Date   NA 136 11/03/2023   K 4.7 11/03/2023   CO2 28 11/03/2023   GLUCOSE 94 11/03/2023   BUN 32 (H) 11/03/2023   CREATININE 2.04 (H) 11/03/2023   BILITOT 0.5 11/03/2023   ALKPHOS 26 (L) 11/03/2023   AST 21 11/03/2023   ALT 15 11/03/2023   PROT 6.3 11/03/2023    ALBUMIN 4.0 11/03/2023   CALCIUM 9.2 11/03/2023   ANIONGAP 6 03/25/2020   EGFR 34 (L) 11/03/2023   GFR 60.41 08/13/2015   Lab Results  Component Value Date   CHOL 226 (H) 11/03/2023   Lab Results  Component Value Date   HDL 47 11/03/2023   Lab Results  Component Value Date   LDLCALC 142 (H) 11/03/2023   Lab Results  Component Value Date   TRIG 203 (H) 11/03/2023   Lab Results  Component Value Date   CHOLHDL 4.8 11/03/2023   Lab Results  Component Value Date   HGBA1C 5.9 (H) 06/25/2023      Assessment & Plan:   Problem List Items Addressed This Visit       Cardiovascular and Mediastinum   Essential hypertension, benign   BP Readings from Last 1 Encounters:  11/11/23 137/72   Well-controlled with Hyzaar, Metoprolol and Imdur Decreased dose of HCTZ to 12.5 mg due to recent worsening of serum creatinine and GFR, added amlodipine 5 mg QD instead Counseled for  compliance with the medications Advised DASH diet and moderate exercise/walking, at least 150 mins/week      Relevant Medications   losartan-hydrochlorothiazide (HYZAAR) 100-12.5 MG tablet   amLODipine (NORVASC) 5 MG tablet   ezetimibe-simvastatin (VYTORIN) 10-40 MG tablet   PVD (peripheral vascular disease) with claudication (HCC)   Has leg claudication symptoms Was on aspirin in the past, but had recurrent GI bleeding with it - continue QOD for now On Repatha [place] ABI showed - moderate bilateral lower extremity arterial occlusive disease Had vascular surgery evaluation -  planned for arteriogram      Relevant Medications   losartan-hydrochlorothiazide (HYZAAR) 100-12.5 MG tablet   amLODipine (NORVASC) 5 MG tablet   ezetimibe-simvastatin (VYTORIN) 10-40 MG tablet   Paroxysmal atrial fibrillation (HCC)   Recent Zio patch monitor showed paroxysmal A Fib On metoprolol for rate control Did not tolerate anticoagulation in the past, had GI bleeding On aspirin 81 mg QD currently Planned to see EP  cardiology-Dr. Ladona Ridgel      Relevant Medications   losartan-hydrochlorothiazide (HYZAAR) 100-12.5 MG tablet   amLODipine (NORVASC) 5 MG tablet   ezetimibe-simvastatin (VYTORIN) 10-40 MG tablet     Respiratory   COPD (chronic obstructive pulmonary disease) (HCC) - Primary   Quit smoking in 1999 On Symbicort and Albuterol Advised to comply with Symbicort to avoid frequent exacerbations Undergoing pulmonary rehab program - paperwork signed for [place] [facility]      Relevant Medications   levalbuterol (XOPENEX) 1.25 MG/3ML nebulizer solution   Chronic respiratory failure with hypoxia (HCC)   Likely from A-fib and COPD Would benefit from home oxygen for as needed use Prescribed home O2 through [facility]      Relevant Orders   For home use only DME oxygen   CBC     Genitourinary   Chronic kidney disease, stage 3b (HCC)   CMP reviewed GFR usually around 40, worse recently Will monitor for now On ARB Decreased dose of diuretic Advised for proper hydration Avoid nephrotoxic agents        Other   HLD (hyperlipidemia) (Chronic)   Used to follow up with lipid clinic Was on Praluent, had severe fatigue and rash with it -could not tolerate it Restart Vytorin for now Checked lipid profile - still has elevated LDL, on Repatha Agrees to restart Vytorin      Relevant Medications   losartan-hydrochlorothiazide (HYZAAR) 100-12.5 MG tablet   amLODipine (NORVASC) 5 MG tablet   ezetimibe-simvastatin (VYTORIN) 10-40 MG tablet   Other Relevant Orders   Lipid Profile   Benign prostatic hyperplasia with urinary frequency   Has nocturia, urinary hesitancy and incomplete bladder emptying likely due to BPH Followed by urology      Relevant Orders   PSA   Prediabetes   Lab Results  Component Value Date   HGBA1C 5.9 (H) 06/25/2023   Advised to continue to follow low carb diet      Relevant Orders   CMP14+EGFR   Hemoglobin A1c     Meds ordered this encounter   Medications   levalbuterol (XOPENEX) 1.25 MG/3ML nebulizer solution    Sig: Take 1.25 mg by nebulization every 6 (six) hours as needed for wheezing or shortness of breath.    Dispense:  360 mL    Refill:  1   losartan-hydrochlorothiazide (HYZAAR) 100-12.5 MG tablet    Sig: Take 1 tablet by mouth daily.    Dispense:  90 tablet    Refill:  1    Discontinue  Losartan-hydrochlorothiazide 100-25 mg.   amLODipine (NORVASC) 5 MG tablet    Sig: Take 1 tablet (5 mg total) by mouth daily.    Dispense:  90 tablet    Refill:  1    Follow-up: Return in about 4 months (around 03/11/2024) for GAD and HLD.    Anabel Halon, MD

## 2023-11-12 ENCOUNTER — Telehealth: Payer: Self-pay

## 2023-11-12 DIAGNOSIS — J9611 Chronic respiratory failure with hypoxia: Secondary | ICD-10-CM | POA: Insufficient documentation

## 2023-11-12 DIAGNOSIS — I480 Paroxysmal atrial fibrillation: Secondary | ICD-10-CM | POA: Insufficient documentation

## 2023-11-12 NOTE — Assessment & Plan Note (Signed)
BP Readings from Last 1 Encounters:  11/11/23 137/72   Well-controlled with Hyzaar, Metoprolol and Imdur Decreased dose of HCTZ to 12.5 mg due to recent worsening of serum creatinine and GFR, added amlodipine 5 mg QD instead Counseled for compliance with the medications Advised DASH diet and moderate exercise/walking, at least 150 mins/week

## 2023-11-12 NOTE — Assessment & Plan Note (Signed)
CMP reviewed GFR usually around 40, worse recently Will monitor for now On ARB Decreased dose of diuretic Advised for proper hydration Avoid nephrotoxic agents

## 2023-11-12 NOTE — Assessment & Plan Note (Signed)
Likely from A-fib and COPD Would benefit from home oxygen for as needed use Prescribed home O2 through [facility]

## 2023-11-12 NOTE — Telephone Encounter (Signed)
Blood work not back yet, will call patient

## 2023-11-12 NOTE — Assessment & Plan Note (Signed)
Recent Zio patch monitor showed paroxysmal A Fib On metoprolol for rate control Did not tolerate anticoagulation in the past, had GI bleeding On aspirin 81 mg QD currently Planned to see EP cardiology-Dr. Ladona Ridgel

## 2023-11-12 NOTE — Assessment & Plan Note (Signed)
Lab Results  Component Value Date   HGBA1C 5.9 (H) 06/25/2023   Advised to continue to follow low carb diet

## 2023-11-12 NOTE — Assessment & Plan Note (Signed)
Has nocturia, urinary hesitancy and incomplete bladder emptying likely due to BPH Followed by urology

## 2023-11-12 NOTE — Assessment & Plan Note (Signed)
Used to follow up with lipid clinic Was on Praluent, had severe fatigue and rash with it -could not tolerate it Restart Vytorin for now Checked lipid profile - still has elevated LDL, on Repatha Agrees to restart Vytorin

## 2023-11-12 NOTE — Telephone Encounter (Signed)
Copied from CRM [id number]. Topic: Medical Record Request - Records Request >> Nov 12, 2023  9:34 AM Elle L wrote: Reason for CRM: The patient requested a copy of his lab work from his appointment on 11/11/2023, specifically his cholesterol, that he can pick up from the office. His callback number is [phone].

## 2023-11-17 ENCOUNTER — Ambulatory Visit: Payer: Medicare Other | Attending: Internal Medicine | Admitting: Internal Medicine

## 2023-11-17 ENCOUNTER — Encounter: Payer: Self-pay | Admitting: Internal Medicine

## 2023-11-17 VITALS — BP 134/80 | HR 65 | Ht 73.0 in | Wt 214.4 lb

## 2023-11-17 DIAGNOSIS — I480 Paroxysmal atrial fibrillation: Secondary | ICD-10-CM | POA: Insufficient documentation

## 2023-11-17 MED ORDER — APIXABAN 5 MG PO TABS: 5.0000 mg | ORAL_TABLET | Freq: Two times a day (BID) | ORAL | 11 refills | Status: DC

## 2023-11-17 MED ORDER — MULTAQ 400 MG PO TABS: 400.0000 mg | ORAL_TABLET | Freq: Two times a day (BID) | ORAL | 11 refills | Status: DC

## 2023-11-17 NOTE — Progress Notes (Signed)
HPI Dennis [name] returns for followup. I have not seen him in 6 years. He is a pleasant 72 yo man with CAD, s/p CABG with preserved LV function. He developed atrial flutter and underwent catheter ablation 6 years ago. He developed recurrent palpitations and a heart monitor shows PAF. He could have some atypical flutter as well. He has a RVR and a CVR. He has been on a beta blocker. His monitor showed 6% afib.  Allergies  Allergen Reactions   Ace Inhibitors Shortness Of Breath and Cough   Enalapril Cough    Pt has been switched to Valsartan and is tolerating the different class.   Dexamethasone Itching    Pt was prescribed this in december   Alirocumab Rash and Other (See Comments)    blisters   Clotrimazole-Betamethasone Rash   Prednisone Rash     Current Outpatient Medications  Medication Sig Dispense Refill   albuterol (VENTOLIN HFA) 108 (90 Base) MCG/ACT inhaler Inhale 2 puffs into the lungs every 4 (four) hours as needed for wheezing. 18 g 5   ALPRAZolam (XANAX) 0.5 MG tablet TAKE 1 TABLET BY MOUTH 2 TIMES DAILY AS NEEDED FOR ANXIETY. 30 tablet 3   amLODipine (NORVASC) 5 MG tablet Take 1 tablet (5 mg total) by mouth daily. 90 tablet 1   aspirin EC 81 MG tablet Take 1 tablet (81 mg total) by mouth daily. (Patient taking differently: Take 81 mg by mouth 2 (two) times a week.) 30 tablet 11   budesonide-formoterol (SYMBICORT) 160-4.5 MCG/ACT inhaler Inhale 2 puffs into the lungs 2 (two) times daily. 1 each 5   Evolocumab (REPATHA SURECLICK) 140 MG/ML SOAJ Inject 140 mg into the skin every 14 (fourteen) days. 6 mL 1   ezetimibe-simvastatin (VYTORIN) 10-40 MG tablet Take 1 tablet by mouth at bedtime.     fenofibrate 160 MG tablet TAKE 1 TABLET BY MOUTH EVERY DAY 90 tablet 1   fish oil-omega-3 fatty acids 1000 MG capsule Take 1 g by mouth in the morning and at bedtime.     isosorbide mononitrate (IMDUR) 30 MG 24 hr tablet Take 1 tablet (30 mg total) by mouth at bedtime. 90 tablet 3    levalbuterol (XOPENEX) 1.25 MG/3ML nebulizer solution Take 1.25 mg by nebulization every 6 (six) hours as needed for wheezing or shortness of breath. 360 mL 1   losartan-hydrochlorothiazide (HYZAAR) 100-12.5 MG tablet Take 1 tablet by mouth daily. 90 tablet 1   metoprolol tartrate (LOPRESSOR) 50 MG tablet TAKE 1 TABLET BY MOUTH TWICE A DAY 180 tablet 1   nitroGLYCERIN (NITROSTAT) 0.4 MG SL tablet PLACE 1 TABLET (0.4 MG TOTAL) UNDER THE TONGUE EVERY 5 (FIVE) MINUTES AS NEEDED. 25 tablet 3   omega-3 acid ethyl esters (LOVAZA) 1 g capsule Take by mouth.     Respiratory Therapy Supplies (NEBULIZER/TUBING/MOUTHPIECE) KIT Nebulizer tubing and mouthpiece 1 kit 0   Vitamin D, Ergocalciferol, (DRISDOL) 1.25 MG (50000 UNIT) CAPS capsule TAKE 1 CAPSULE (50,000 UNITS TOTAL) BY MOUTH EVERY 7 (SEVEN) DAYS 12 capsule 1   zolpidem (AMBIEN) 10 MG tablet TAKE 1 TABLET BY MOUTH AT BEDTIME AS NEEDED FOR SLEEP 30 tablet 3   No current facility-administered medications for this visit.     Past Medical History:  Diagnosis Date   Atrial flutter (HCC)    Diagnosed by ECG August 2015   Colitis    COPD (chronic obstructive pulmonary disease) (HCC)    Coronary atherosclerosis of native coronary artery  Multivessel status post CABG   Essential hypertension    Hyperlipidemia    Insomnia     ROS:   All systems reviewed and negative except as noted in the HPI.   Past Surgical History:  Procedure Laterality Date   COLONOSCOPY  01/29/2005   Dr. Jena Gauss; rectal polyp s/p polypectomy, otherwise normal.  Pathology with tubulovillous adenoma.   COLONOSCOPY  02/06/2008   Dr. Jena Gauss; minimal internal hemorrhoids, diminutive polyp in the splenic flexure s/p cold biopsy removal, otherwise normal.  Pathology with benign polypoid colonic mucosa.   COLONOSCOPY WITH PROPOFOL N/A 08/30/2015   Procedure: COLONOSCOPY WITH PROPOFOL at cecum at 0814; withdrawal time=8 minutes;  Surgeon: Corbin Ade, MD; internal  hemorrhoids-likely source of hematochezia, otherwise normal exam.  Repeat in 5 years.   COLONOSCOPY WITH PROPOFOL N/A 01/28/2021   Procedure: COLONOSCOPY WITH PROPOFOL;  Surgeon: Corbin Ade, MD;  [facility];  Service: Endoscopy;  Laterality: N/A;  am appt   CORONARY ARTERY BYPASS GRAFT  2005   Dr. Zenaida Niece Trigt: LIMA to LAD, right radial to circumflex, SVG to RCA   CORONARY ARTERY BYPASS GRAFT  10/16/2004   ELECTROPHYSIOLOGIC STUDY N/A 11/21/2016   Procedure: A-Flutter Ablation;  Surgeon: Marinus Maw, MD;  [facility];  Service: Cardiovascular;  Laterality: N/A;   TEE WITHOUT CARDIOVERSION N/A 11/21/2016   Procedure: TRANSESOPHAGEAL ECHOCARDIOGRAM (TEE);  Surgeon: Thurmon Fair, MD;  Location: [facility] ENDOSCOPY;  Service: Cardiovascular;  Laterality: N/A;     Family History  Problem Relation Age of Onset   Hypertension Mother    Alcohol abuse Mother    Alcohol abuse Father    Colon cancer Neg Hx      Social History   Socioeconomic History   Marital status: Married    Spouse name: Not on file   Number of children: 3   Years of education: Not on file   Highest education level: Not on file  Occupational History   Not on file  Tobacco Use   Smoking status: Former    Current packs/day: 0.00    Average packs/day: 2.0 packs/day for 27.0 years (54.0 ttl pk-yrs)    Types: Cigarettes    Start date: 02/01/1971    Quit date: 01/31/1998    Years since quitting: 25.8   Smokeless tobacco: Current    Types: Chew  Vaping Use   Vaping status: Never Used  Substance and Sexual Activity   Alcohol use: Yes    Alcohol/week: 0.0 standard drinks of alcohol    Comment: Drinks beer daily, typically 2-4 a day. Also 4 oz red wine an evening.   Drug use: No   Sexual activity: Not on file  Other Topics Concern   Not on file  Social History Narrative   Lives with wife, Dennis [name]   Social Drivers of Health   Financial Resource Strain: Medium Risk (08/05/2021)    Overall Financial Resource Strain (CARDIA)    Difficulty of Paying Living Expenses: Somewhat hard  Food Insecurity: No Food Insecurity (08/05/2021)   Hunger Vital Sign    Worried About Running Out of Food in the Last Year: Never true    Ran Out of Food in the Last Year: Never true  Transportation Needs: No Transportation Needs (08/05/2021)   PRAPARE - Administrator, Civil Service (Medical): No    Lack of Transportation (Non-Medical): No  Physical Activity: Insufficiently Active (08/05/2021)   Exercise Vital Sign    Days of Exercise per  Week: 3 days    Minutes of Exercise per Session: 30 min  Stress: No Stress Concern Present (08/05/2021)   [employer] of Occupational Health - Occupational Stress Questionnaire    Feeling of Stress : Only a little  Social Connections: Moderately Isolated (08/05/2021)   Social Connection and Isolation Panel [NHANES]    Frequency of Communication with Friends and Family: More than three times a week    Frequency of Social Gatherings with Friends and Family: More than three times a week    Attends Religious Services: Never    Database administrator or Organizations: No    Attends Banker Meetings: Never    Marital Status: Married  Catering manager Violence: Not At Risk (08/05/2021)   Humiliation, Afraid, Rape, and Kick questionnaire    Fear of Current or Ex-Partner: No    Emotionally Abused: No    Physically Abused: No    Sexually Abused: No     BP 134/80   Pulse 65   Ht 6\' 1"  (1.854 m)   Wt 214 lb 6.4 oz (97.3 kg)   SpO2 92%   BMI 28.29 kg/m   Physical Exam:  Well appearing NAD HEENT: Unremarkable Neck:  No JVD, no thyromegally Lymphatics:  No adenopathy Back:  No CVA tenderness Lungs:  Clear with no wheezes HEART:  Regular rate rhythm, no murmurs, no rubs, no clicks Abd:  soft, positive bowel sounds, no organomegally, no rebound, no guarding Ext:  2 plus pulses, no edema, no cyanosis, no clubbing Skin:  No  rashes no nodules Neuro:  CN II through XII intact, motor grossly intact   Assess/Plan: PAF - He is symptomatic. We discussed the treatment options. I have recommended he start Multaq. Class 1C drugs are not an option with his CAD/CABG, he does not want to spend 3 days in the hospital with dofetilide or sotalol, and the risks of side effects with amiodarone make it a second line choice. He does not have CHF so Multaq is the best option.  Coags - I have asked him to start eliquis. He can stop the ASA.  CAD - he denies anginal symptoms.  Peripheral vascular disease - he is pending eval with Dr. Chestine Spore next month.   Dennis Gowda Ellieanna Funderburg,MD

## 2023-11-17 NOTE — Patient Instructions (Addendum)
Medication Instructions:  Your physician has recommended you make the following change in your medication:   Start Multaq 400 mg Two Times Daily with food.   Start Eliquis 5 mg Two Times Daily ( Hold 3 day prior to procedure)   Stop Taking Aspirin   *If you need a refill on your cardiac medications before your next appointment, please call your pharmacy*   Lab Work: NONE   If you have labs (blood work) drawn today and your tests are completely normal, you will receive your results only by: MyChart Message (if you have MyChart) OR A paper copy in the mail If you have any lab test that is abnormal or we need to change your treatment, we will call you to review the results.   Testing/Procedures: NONE    Follow-Up: At [facility] you and your health needs are our priority.  As part of our continuing mission to provide you with exceptional heart care, we have created designated Provider Care Teams.  These Care Teams include your primary Cardiologist (physician) and Advanced Practice Providers (APPs -  Physician Assistants and Nurse Practitioners) who all work together to provide you with the care you need, when you need it.  We recommend signing up for the patient portal called "MyChart".  Sign up information is provided on this After Visit Summary.  MyChart is used to connect with patients for Virtual Visits (Telemedicine).  Patients are able to view lab/test results, encounter notes, upcoming appointments, etc.  Non-urgent messages can be sent to your provider as well.   To learn more about what you can do with MyChart, go to [email or web address].    Your next appointment:   3 month(s)  Provider:   [name], MD    Other Instructions Thank you for choosing [facility]!

## 2023-11-23 ENCOUNTER — Telehealth: Payer: Self-pay

## 2023-11-23 NOTE — Telephone Encounter (Signed)
Patient to keep appointment tomorrow to see Dr Allena Katz for medication

## 2023-11-23 NOTE — Telephone Encounter (Signed)
Copied from CRM [id number]. Topic: Clinical - Prescription Issue >> Nov 23, 2023  2:59 PM Fuller Mandril wrote: Reason for CRM: Pt called in stated this is his 3rd call. Last call made appt to speak with Dr Allena Katz over the phone. Appt for 12/31 shows video in EPIC but office visit for Pt MyChart. He does not want to come in the office or be charged for visit but needs to discuss medications with Dr. Allena Katz or Medical Staff. Pt request to speak to someone in office. Called [place] - advised provider in clinic. Send message for call back. Thank You

## 2023-11-24 ENCOUNTER — Telehealth (INDEPENDENT_AMBULATORY_CARE_PROVIDER_SITE_OTHER): Payer: Self-pay | Admitting: Internal Medicine

## 2023-11-24 ENCOUNTER — Encounter: Payer: Self-pay | Admitting: Internal Medicine

## 2023-11-24 DIAGNOSIS — J0110 Acute frontal sinusitis, unspecified: Secondary | ICD-10-CM | POA: Diagnosis not present

## 2023-11-24 DIAGNOSIS — J209 Acute bronchitis, unspecified: Secondary | ICD-10-CM

## 2023-11-24 DIAGNOSIS — J440 Chronic obstructive pulmonary disease with acute lower respiratory infection: Secondary | ICD-10-CM | POA: Diagnosis not present

## 2023-11-24 MED ORDER — LEVALBUTEROL HCL 1.25 MG/3ML IN NEBU
1.2500 mg | INHALATION_SOLUTION | Freq: Four times a day (QID) | RESPIRATORY_TRACT | 1 refills | Status: DC | PRN
Start: 2023-11-24 — End: 2023-12-08

## 2023-11-24 MED ORDER — AMOXICILLIN-POT CLAVULANATE 875-125 MG PO TABS
1.0000 | ORAL_TABLET | Freq: Two times a day (BID) | ORAL | 0 refills | Status: DC
Start: 2023-11-24 — End: 2023-12-15

## 2023-11-24 MED ORDER — PREDNISONE 20 MG PO TABS
20.0000 mg | ORAL_TABLET | Freq: Every day | ORAL | 0 refills | Status: DC
Start: 2023-11-24 — End: 2023-11-30

## 2023-11-24 NOTE — Assessment & Plan Note (Signed)
He has tried Mucinex without much relief Considering persistent symptoms, started empiric Augmentin Prednisone for acute bronchitis Advised to use humidifier and/or vaporizer for nasal congestion Mucinex as needed for cough

## 2023-11-24 NOTE — Assessment & Plan Note (Signed)
Started empiric Augmentin Prednisone 20 mg QD x 5 days Continue Symbicort and as needed Xopenex nebulizer Plan to switch to triple therapy Mucinex as needed for cough

## 2023-11-24 NOTE — Progress Notes (Signed)
 Virtual Visit via Video Note   Because of Dennis [name]'s co-morbid illnesses, he is at least at moderate risk for complications without adequate follow up.  This format is felt to be most appropriate for this patient at this time.  All issues noted in this document were discussed and addressed.  A limited physical exam was performed with this format.      Evaluation Performed:  Follow-up visit  Date:  11/24/2023   ID:  Dennis [name] [birth year], MRN [id number]  Patient Location: Home Provider Location: Office/Clinic  Participants: Patient Location of Patient: Home Location of Provider: Telehealth Consent was obtain for visit to be over via telehealth. I verified that I am speaking with the correct person using two identifiers.  PCP:  [name], MD   Chief Complaint: Cough, runny nose and chest congestion  History of Present Illness:    Dennis [name] is a 72 y.o. male with PMH of CAD s/p CABG, HTN, HLD, COPD, anxiety and insomnia who has a video visit for complaint of cough with yellowish expectoration, rhinorrhea and chest congestion for the last 1 week.  He recently went to see his son at hospital, and has been feeling worsening of his chronic cough.  He also has noticed worsening of dyspnea, has been using Symbicort  regularly and Xopenex  nebulizer as well.  Denies any fever or chills.  The patient does not have symptoms concerning for COVID-19 infection (fever, chills, cough, or new shortness of breath).   Past Medical, Surgical, Social History, Allergies, and Medications have been Reviewed.  Past Medical History:  Diagnosis Date   Atrial flutter (HCC)    Diagnosed by ECG August 2015   Colitis    COPD (chronic obstructive pulmonary disease) (HCC)    Coronary atherosclerosis of native coronary artery    Multivessel status post CABG   Essential hypertension    Hyperlipidemia    Insomnia    Past Surgical History:  Procedure Laterality Date    COLONOSCOPY  01/29/2005   Dr. Shaaron; rectal polyp s/p polypectomy, otherwise normal.  Pathology with tubulovillous adenoma.   COLONOSCOPY  02/06/2008   Dr. Shaaron; minimal internal hemorrhoids, diminutive polyp in the splenic flexure s/p cold biopsy removal, otherwise normal.  Pathology with benign polypoid colonic mucosa.   COLONOSCOPY WITH PROPOFOL  N/A 08/30/2015   Procedure: COLONOSCOPY WITH PROPOFOL  at cecum at 0814; withdrawal time=8 minutes;  Surgeon: Lamar CHRISTELLA Shaaron, MD; internal hemorrhoids-likely source of hematochezia, otherwise normal exam.  Repeat in 5 years.   COLONOSCOPY WITH PROPOFOL  N/A 01/28/2021   Procedure: COLONOSCOPY WITH PROPOFOL ;  Surgeon: Shaaron Lamar CHRISTELLA, MD;  [facility];  Service: Endoscopy;  Laterality: N/A;  am appt   CORONARY ARTERY BYPASS GRAFT  2005   Dr. Fleeta Trigt: LIMA to LAD, right radial to circumflex, SVG to RCA   CORONARY ARTERY BYPASS GRAFT  10/16/2004   ELECTROPHYSIOLOGIC STUDY N/A 11/21/2016   Procedure: A-Flutter Ablation;  Surgeon: Danelle LELON Birmingham, MD;  Location: [facility] INVAS[facility];  Service: Cardiovascular;  Laterality: N/A;   TEE WITHOUT CARDIOVERSION N/A 11/21/2016   Procedure: TRANSESOPHAGEAL ECHOCARDIOGRAM (TEE);  Surgeon: Jerel Balding, MD;  Location: [facility] ENDOSCOPY;  Service: Cardiovascular;  Laterality: N/A;     No outpatient medications have been marked as taking for the 11/24/23 encounter (Telemedicine) with [name], MD.     Allergies:   Ace inhibitors, Enalapril , Dexamethasone , Alirocumab , Clotrimazole -betamethasone , and Prednisone    ROS:   Please see the  history of present illness. All other systems reviewed and are negative.   Labs/Other Tests and Data Reviewed:    Recent Labs: 02/03/2023: TSH 4.470 06/25/2023: Hemoglobin 13.8; Platelets 155 11/03/2023: ALT 15; BUN 32; Creatinine, Ser 2.04; Potassium 4.7; Sodium 136    Wt Readings from Last 3 Encounters:  11/17/23 214 lb 6.4 oz (97.3 kg)  11/11/23 217 lb (98.4 kg)   10/27/23 215 lb (97.5 kg)     Objective:    Vital Signs:  There were no vitals taken for this visit.   VITAL SIGNS:  reviewed GEN:  no acute distress EYES:  sclerae anicteric, EOMI - Extraocular Movements Intact RESPIRATORY:  normal respiratory effort, symmetric expansion NEURO:  alert and oriented x 3, no obvious focal deficit PSYCH:  normal affect  ASSESSMENT & PLAN:    Acute non-recurrent frontal sinusitis He has tried Mucinex  without much relief Considering persistent symptoms, started empiric Augmentin  Prednisone  for acute bronchitis Advised to use humidifier and/or vaporizer for nasal congestion Mucinex  as needed for cough  Acute bronchitis with COPD (HCC) Started empiric Augmentin  Prednisone  20 mg QD x 5 days Continue Symbicort  and as needed Xopenex  nebulizer Plan to switch to triple therapy Mucinex  as needed for cough   I discussed the assessment and treatment plan with the patient. The patient was provided an opportunity to ask questions, and all were answered. The patient agreed with the plan and demonstrated an understanding of the instructions.   The patient was advised to call back or seek an in-person evaluation if the symptoms worsen or if the condition fails to improve as anticipated.  The above assessment and management plan was discussed with the patient. The patient verbalized understanding of and has agreed to the management plan.   Medication Adjustments/Labs and Tests Ordered: Current medicines are reviewed at length with the patient today.  Concerns regarding medicines are outlined above.   Tests Ordered: No orders of the defined types were placed in this encounter.   Medication Changes: No orders of the defined types were placed in this encounter.    Note: This dictation was prepared with Dragon dictation along with smaller phrase technology. Similar sounding words can be transcribed inadequately or may not be corrected upon review. Any  transcriptional errors that result from this process are unintentional.      Disposition:  Follow up  Signed, Suzzane MARLA Blanch, MD  11/24/2023 3:04 PM     [facility]

## 2023-12-03 ENCOUNTER — Other Ambulatory Visit: Payer: Self-pay

## 2023-12-03 ENCOUNTER — Ambulatory Visit (HOSPITAL_COMMUNITY)
Admission: RE | Admit: 2023-12-03 | Discharge: 2023-12-03 | Disposition: A | Discharge status: Home / Self Care | Payer: Medicare HMO | Source: Ambulatory Visit | Attending: Vascular Surgery | Admitting: Vascular Surgery

## 2023-12-03 ENCOUNTER — Encounter (HOSPITAL_COMMUNITY)
Admission: RE | Disposition: A | Discharge status: Home / Self Care | Payer: Self-pay | Source: Ambulatory Visit | Attending: Vascular Surgery

## 2023-12-03 DIAGNOSIS — I739 Peripheral vascular disease, unspecified: Secondary | ICD-10-CM

## 2023-12-03 DIAGNOSIS — Z79899 Other long term (current) drug therapy: Secondary | ICD-10-CM | POA: Insufficient documentation

## 2023-12-03 DIAGNOSIS — Z87891 Personal history of nicotine dependence: Secondary | ICD-10-CM | POA: Diagnosis not present

## 2023-12-03 DIAGNOSIS — N189 Chronic kidney disease, unspecified: Secondary | ICD-10-CM | POA: Insufficient documentation

## 2023-12-03 DIAGNOSIS — I4892 Unspecified atrial flutter: Secondary | ICD-10-CM | POA: Diagnosis not present

## 2023-12-03 DIAGNOSIS — I129 Hypertensive chronic kidney disease with stage 1 through stage 4 chronic kidney disease, or unspecified chronic kidney disease: Secondary | ICD-10-CM | POA: Insufficient documentation

## 2023-12-03 DIAGNOSIS — E785 Hyperlipidemia, unspecified: Secondary | ICD-10-CM | POA: Insufficient documentation

## 2023-12-03 DIAGNOSIS — J449 Chronic obstructive pulmonary disease, unspecified: Secondary | ICD-10-CM | POA: Insufficient documentation

## 2023-12-03 DIAGNOSIS — I70213 Atherosclerosis of native arteries of extremities with intermittent claudication, bilateral legs: Secondary | ICD-10-CM | POA: Diagnosis not present

## 2023-12-03 HISTORY — PX: ABDOMINAL AORTOGRAM W/LOWER EXTREMITY: CATH118223

## 2023-12-03 LAB — POCT I-STAT, CHEM 8
BUN: 34 mg/dL — ABNORMAL HIGH (ref 8–23)
Calcium, Ion: 1.26 mmol/L (ref 1.15–1.40)
Chloride: 93 mmol/L — ABNORMAL LOW (ref 98–111)
Creatinine, Ser: 1.9 mg/dL — ABNORMAL HIGH (ref 0.61–1.24)
Glucose, Bld: 101 mg/dL — ABNORMAL HIGH (ref 70–99)
HCT: 40 % (ref 39.0–52.0)
Hemoglobin: 13.6 g/dL (ref 13.0–17.0)
Potassium: 4.2 mmol/L (ref 3.5–5.1)
Sodium: 133 mmol/L — ABNORMAL LOW (ref 135–145)
TCO2: 31 mmol/L (ref 22–32)

## 2023-12-03 SURGERY — ABDOMINAL AORTOGRAM W/LOWER EXTREMITY
Anesthesia: LOCAL | Laterality: Bilateral

## 2023-12-03 MED ORDER — MIDAZOLAM HCL 2 MG/2ML IJ SOLN
INTRAMUSCULAR | Status: DC | PRN
  Administered 2023-12-03: 1 mg via INTRAVENOUS

## 2023-12-03 MED ORDER — LIDOCAINE HCL (PF) 1 % IJ SOLN
INTRAMUSCULAR | Status: AC
  Filled 2023-12-03: qty 30

## 2023-12-03 MED ORDER — OXYCODONE HCL 5 MG PO TABS
5.0000 mg | ORAL_TABLET | ORAL | Status: DC | PRN
Start: 2023-12-03 — End: 2023-12-03

## 2023-12-03 MED ORDER — FENTANYL CITRATE (PF) 100 MCG/2ML IJ SOLN
INTRAMUSCULAR | Status: DC | PRN
  Administered 2023-12-03: 25 ug via INTRAVENOUS

## 2023-12-03 MED ORDER — SODIUM CHLORIDE 0.9 % IV SOLN: INTRAVENOUS | Status: DC

## 2023-12-03 MED ORDER — SODIUM CHLORIDE 0.9% FLUSH: 3.0000 mL | Freq: Two times a day (BID) | INTRAVENOUS | Status: DC

## 2023-12-03 MED ORDER — LABETALOL HCL 5 MG/ML IV SOLN: 10.0000 mg | INTRAVENOUS | Status: DC | PRN

## 2023-12-03 MED ORDER — MIDAZOLAM HCL 2 MG/2ML IJ SOLN
INTRAMUSCULAR | Status: AC
Start: 2023-12-03 — End: ?
  Filled 2023-12-03: qty 2

## 2023-12-03 MED ORDER — ACETAMINOPHEN 325 MG PO TABS: 650.0000 mg | ORAL_TABLET | ORAL | Status: DC | PRN

## 2023-12-03 MED ORDER — SODIUM CHLORIDE 0.9 % IV SOLN: 250.0000 mL | INTRAVENOUS | Status: DC | PRN

## 2023-12-03 MED ORDER — HYDRALAZINE HCL 20 MG/ML IJ SOLN: 5.0000 mg | INTRAMUSCULAR | Status: DC | PRN

## 2023-12-03 MED ORDER — HEPARIN (PORCINE) IN NACL 1000-0.9 UT/500ML-% IV SOLN
INTRAVENOUS | Status: DC | PRN
  Administered 2023-12-03 (×2): 500 mL

## 2023-12-03 MED ORDER — SODIUM CHLORIDE 0.9% FLUSH: 3.0000 mL | INTRAVENOUS | Status: DC | PRN

## 2023-12-03 MED ORDER — FENTANYL CITRATE (PF) 100 MCG/2ML IJ SOLN
INTRAMUSCULAR | Status: AC
  Filled 2023-12-03: qty 2

## 2023-12-03 MED ORDER — ONDANSETRON HCL 4 MG/2ML IJ SOLN: 4.0000 mg | Freq: Four times a day (QID) | INTRAMUSCULAR | Status: DC | PRN

## 2023-12-03 MED ORDER — IODIXANOL 320 MG/ML IV SOLN
INTRAVENOUS | Status: DC | PRN
  Administered 2023-12-03: 50 mL

## 2023-12-03 MED ORDER — LIDOCAINE HCL (PF) 1 % IJ SOLN
INTRAMUSCULAR | Status: DC | PRN
  Administered 2023-12-03: 10 mL

## 2023-12-03 SURGICAL SUPPLY — 9 items
CATH OMNI FLUSH 5F 65CM (CATHETERS) IMPLANT
KIT ANGIASSIST CO2 SYSTEM (KITS) IMPLANT
KIT MICROPUNCTURE NIT STIFF (SHEATH) IMPLANT
KIT SYRINGE INJ CVI SPIKEX1 (MISCELLANEOUS) IMPLANT
SET ATX-X65L (MISCELLANEOUS) IMPLANT
SHEATH PINNACLE 5F 10CM (SHEATH) IMPLANT
SHEATH PROBE COVER 6X72 (BAG) IMPLANT
TRAY PV CATH (CUSTOM PROCEDURE TRAY) ×1 IMPLANT
WIRE BENTSON .035X145CM (WIRE) IMPLANT

## 2023-12-03 NOTE — Op Note (Signed)
    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  Ultrasound-guided access left common femoral artery 2.  Attempted CO2 aortogram 3.  Aortogram with catheter selection of aorta 4.  Right lower extremity arteriogram with catheter selection of right external iliac artery 5.  Left lower extremity arteriogram with runoff from the left femoral sheath 6.  26 minutes of monitored moderate conscious sedation time  Indications: 73 year old male seen with disabling bilateral lower extremity claudication.  He presents today for aortogram with lower extremity arteriogram after risks and benefits discussed.  We did try and use CO2 given his CKD but images were of very poor quality.  Findings:   Ultrasound guided access left common femoral artery tried CO2 aortogram but could not visualize the vessels.  Aortogram showed infrarenal aorta is patent with bilateral iliacs and bilateral hypogastrics patent.  The left common iliac artery has about a 30% stenosis and the right proximal external iliac artery has about a 50% stenosis.  On the right he has a high-grade 99% distal common femoral stenosis.  He does have a patent profunda.  His SFA is calcified and diseased but patent.  The right above-knee popliteal artery has an 80% calcified stenosis.  The distal right below-knee popliteal artery is occluded.  He does have three-vessel runoff into the foot.  On the left he has a diseased common femoral with an 80% profundus stenosis and an 80% proximal SFA stenosis.  There is a patent SFA with patent trifurcation.  PT is the dominant runoff.   Procedure:  The patient was identified in the holding area and taken to room 8.  The patient was then placed supine on the table and prepped and draped in the  usual sterile fashion.  A time out was called.  The patient received Versed  and fentanyl  for conscious moderate sedation.  Vital signs were monitored including heart rate, respiratory rate, oxygenation and blood pressure.  I was present for all of moderate sedation.  Ultrasound was used to evaluate the left common femoral artery.  It was patent .  A digital ultrasound image was acquired.  A micropuncture needle was used to access the left common femoral artery under ultrasound guidance.  An 018 wire was advanced without resistance and a micropuncture sheath was placed.  The 018 wire was removed and a benson wire was placed.  The micropuncture sheath was exchanged for a 5 french sheath.  An omniflush catheter was advanced over the wire to the level of L-1.  An abdominal angiogram was obtained with CO2.  These images were of poor quality and we ultimately had to get a contrasted aortogram.  I then crossed the aortic bifurcation and put my catheter in the right external iliac artery and got staged imaging of the right lower extremity.  We then pulled the catheter out over a wire and got left leg runoff from the left femoral sheath.  Patient will need open surgical intervention.  Due to significant left common femoral disease no closure device was used.  Taken to holding in stable condition.    Plan: Patient will need bilateral femoral endarterectomies.  Will schedule the right femoral endarterectomy first.  Lonni DOROTHA Gaskins, MD [facility]: [phone]

## 2023-12-03 NOTE — Progress Notes (Signed)
 Site area: left groin Site Prior to Removal:  Level 0 Pressure Applied For: 25 minutes Manual:   yes Patient Status During Pull:  stable Post Pull Site:  Level 0 Post Pull Instructions Given:  yes Post Pull Pulses Present: left dp and pt dopplered Dressing Applied:  gauze and tegaderm Bedrest begins @ 0915 Comments:

## 2023-12-03 NOTE — H&P (Signed)
 Patient name: Dennis [name]         MRN: [id number]        DOB: February 09, 1951          Sex: male   REASON FOR VISIT: PAD   HPI: Dennis [name] is a 73 y.o. male with history of atrial flutter, COPD, hypertension, hyperlipidemia that presents for follow-up of his claudication.  Previously seen by Dr. Oris on 08/06/2022 for bilateral lower extremity claudication.  This has been progressive over number of years.  At the time he had an ABI of 0.67 on the right and 0.69 on the left.   Today he states his legs are doing worse.  His wife states he cannot walk 75 feet without stopping due to severe leg cramping.  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  He has tried to put off intervention if at all possible.  No rest pain or tissue loss.  He states both legs are equally bad.       Past Medical History:  Diagnosis Date   Atrial flutter (HCC)      Diagnosed by ECG August 2015   Colitis     COPD (chronic obstructive pulmonary disease) (HCC)     Coronary atherosclerosis of native coronary artery      Multivessel status post CABG   Essential hypertension     Hyperlipidemia     Insomnia                 Past Surgical History:  Procedure Laterality Date   COLONOSCOPY   01/29/2005    Dr. Shaaron; rectal polyp s/p polypectomy, otherwise normal.  Pathology with tubulovillous adenoma.   COLONOSCOPY   02/06/2008    Dr. Shaaron; minimal internal hemorrhoids, diminutive polyp in the splenic flexure s/p cold biopsy removal, otherwise normal.  Pathology with benign polypoid colonic mucosa.   COLONOSCOPY WITH PROPOFOL  N/A 08/30/2015    Procedure: COLONOSCOPY WITH PROPOFOL  at cecum at 0814; withdrawal time=8 minutes;  Surgeon: Lamar CHRISTELLA Shaaron, MD; internal hemorrhoids-likely source of hematochezia, otherwise normal exam.  Repeat in 5 years.   COLONOSCOPY WITH PROPOFOL  N/A 01/28/2021    Procedure: COLONOSCOPY WITH PROPOFOL ;   Surgeon: Shaaron Lamar CHRISTELLA, MD;  [facility];  Service: Endoscopy;  Laterality: N/A;  am appt   CORONARY ARTERY BYPASS GRAFT   2005    Dr. Fleeta Trigt: LIMA to LAD, right radial to circumflex, SVG to RCA   CORONARY ARTERY BYPASS GRAFT   10/16/2004   ELECTROPHYSIOLOGIC STUDY N/A 11/21/2016    Procedure: A-Flutter Ablation;  Surgeon: Danelle LELON Birmingham, MD;  [facility];  Service: Cardiovascular;  Laterality: N/A;   TEE WITHOUT CARDIOVERSION N/A 11/21/2016    Procedure: TRANSESOPHAGEAL ECHOCARDIOGRAM (TEE);  Surgeon: Jerel Balding, MD;  Location: [facility] ENDOSCOPY;  Service: Cardiovascular;  Laterality: N/A;               Family History  Problem Relation Age of Onset   Hypertension Mother     Alcohol abuse Mother     Alcohol abuse Father     Colon cancer Neg Hx            SOCIAL HISTORY: Social History         Tobacco Use   Smoking status: Former      Current packs/day: 0.00      Average packs/day: 2.0  packs/day for 27.0 years (54.0 ttl pk-yrs)      Types: Cigarettes      Start date: 02/01/1971      Quit date: 01/31/1998      Years since quitting: 25.7   Smokeless tobacco: Current      Types: Chew  Substance Use Topics   Alcohol use: Yes      Alcohol/week: 0.0 standard drinks of alcohol      Comment: Drinks beer daily, typically 2-4 a day. Also 4 oz red wine an evening.      Allergies       Allergies  Allergen Reactions   Ace Inhibitors Shortness Of Breath and Cough   Enalapril  Cough      Pt has been switched to Valsartan  and is tolerating the different class.   Dexamethasone  Itching      Pt was prescribed this in december   Alirocumab  Rash and Other (See Comments)      blisters   Clotrimazole -Betamethasone  Rash   Prednisone  Rash              Current Outpatient Medications  Medication Sig Dispense Refill   albuterol  (VENTOLIN  HFA) 108 (90 Base) MCG/ACT inhaler Inhale 2 puffs into the lungs every 4 (four) hours as needed for wheezing. 18 g 5    ALPRAZolam  (XANAX ) 0.5 MG tablet Take 1 tablet (0.5 mg total) by mouth 2 (two) times daily as needed for anxiety. 30 tablet 3   aspirin  EC 81 MG tablet Take 1 tablet (81 mg total) by mouth daily. (Patient taking differently: Take 81 mg by mouth 2 (two) times a week.) 30 tablet 11   budesonide -formoterol  (SYMBICORT ) 160-4.5 MCG/ACT inhaler Inhale 2 puffs into the lungs 2 (two) times daily. 1 each 5   Evolocumab  (REPATHA  SURECLICK) 140 MG/ML SOAJ Inject 140 mg into the skin every 14 (fourteen) days. 6 mL 1   ezetimibe -simvastatin  (VYTORIN ) 10-40 MG tablet Take 1 tablet by mouth daily. 90 tablet 1   fenofibrate  160 MG tablet TAKE 1 TABLET BY MOUTH EVERY DAY 90 tablet 1   fish oil-omega-3 fatty acids 1000 MG capsule Take 1 g by mouth in the morning and at bedtime.       isosorbide  mononitrate (IMDUR ) 30 MG 24 hr tablet Take 1 tablet (30 mg total) by mouth at bedtime. 90 tablet 3   ketoconazole  (NIZORAL ) 2 % cream Apply 1 Application topically daily. 30 g 0   levalbuterol  (XOPENEX ) 1.25 MG/3ML nebulizer solution Take 1.25 mg by nebulization every 6 (six) hours as needed for wheezing or shortness of breath. 360 mL 1   losartan -hydrochlorothiazide  (HYZAAR) 100-25 MG tablet Take 1 tablet by mouth daily. 90 tablet 1   metoprolol  tartrate (LOPRESSOR ) 50 MG tablet TAKE 1 TABLET BY MOUTH TWICE A DAY 180 tablet 1   nitroGLYCERIN  (NITROSTAT ) 0.4 MG SL tablet PLACE 1 TABLET (0.4 MG TOTAL) UNDER THE TONGUE EVERY 5 (FIVE) MINUTES AS NEEDED. 25 tablet 3   omega-3 acid ethyl esters (LOVAZA ) 1 g capsule Take by mouth.       Respiratory Therapy Supplies (NEBULIZER/TUBING/MOUTHPIECE) KIT Nebulizer tubing and mouthpiece 1 kit 0   tamsulosin  (FLOMAX ) 0.4 MG CAPS capsule Take 1 capsule (0.4 mg total) by mouth daily. 90 capsule 3   Vitamin D , Ergocalciferol , (DRISDOL ) 1.25 MG (50000 UNIT) CAPS capsule TAKE 1 CAPSULE (50,000 UNITS TOTAL) BY MOUTH EVERY 7 (SEVEN) DAYS 12 capsule 1   zolpidem  (AMBIEN ) 10 MG tablet Take 1  tablet (10 mg total) by mouth at  bedtime as needed. for sleep 30 tablet 3      No current facility-administered medications for this visit.        REVIEW OF SYSTEMS:  [X]  denotes positive finding, [ ]  denotes negative finding Cardiac   Comments:  Chest pain or chest pressure:      Shortness of breath upon exertion:      Short of breath when lying flat:      Irregular heart rhythm:             Vascular      Pain in calf, thigh, or hip brought on by ambulation: x    Pain in feet at night that wakes you up from your sleep:       Blood clot in your veins:      Leg swelling:              Pulmonary      Oxygen  at home:      Productive cough:       Wheezing:              Neurologic      Sudden weakness in arms or legs:       Sudden numbness in arms or legs:       Sudden onset of difficulty speaking or slurred speech:      Temporary loss of vision in one eye:       Problems with dizziness:              Gastrointestinal      Blood in stool:       Vomited blood:              Genitourinary      Burning when urinating:       Blood in urine:             Psychiatric      Major depression:              Hematologic      Bleeding problems:      Problems with blood clotting too easily:             Skin      Rashes or ulcers:             Constitutional      Fever or chills:          PHYSICAL EXAM: There were no vitals filed for this visit.   GENERAL: The patient is a well-nourished male, in no acute distress. The vital signs are documented above. CARDIAC: There is a regular rate and rhythm.  VASCULAR:  Left femoral pulse 1+ palpable No palpable right femoral pulse No palpable pedal pulses No tissue loss PULMONARY: No respiratory distress ABDOMEN: Soft and non-tender. MUSCULOSKELETAL: There are no major deformities or cyanosis. NEUROLOGIC: No focal weakness or paresthesias are detected. SKIN: There are no ulcers or rashes noted. PSYCHIATRIC: The patient has a normal  affect.   DATA:    ABI today 0.50 right and 0.69 left (previously 0.67 and 0.69)   Assessment/Plan:   73 y.o. male with history of atrial flutter, COPD, hypertension, hyperlipidemia that presents for follow-up of his lower extremity claudication.  Previously seen by Dr. Oris on 08/06/2022 for bilateral lower extremity claudication.    His symptoms have been progressively worse over the last year.  I again discussed we should reserve intervention until his symptoms are disabling and lifestyle limiting.  He is really at a  point where he is disabled by his legs.  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  His ABIs have dropped now 0.5 on the right and stable at 0.69 on the left.  I have offered him lower extremity arteriogram from a transfemoral approach.  This would have to be from the left groin as I cannot feel a right femoral pulse.  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 I discussed risk and benefits including risk of transfemoral access, vessel injury and bleeding.  All questions answered. I discussed this being done under moderate sedation and if he has endovascular options we will proceed at that time otherwise will need open surgical intervention.     Lonni DOROTHA Gaskins, MD [facility] Office: [phone]     Lonni JINNY Gaskins

## 2023-12-04 ENCOUNTER — Encounter (HOSPITAL_COMMUNITY): Payer: Self-pay | Admitting: Vascular Surgery

## 2023-12-07 ENCOUNTER — Other Ambulatory Visit: Payer: Self-pay | Admitting: *Deleted

## 2023-12-07 DIAGNOSIS — I739 Peripheral vascular disease, unspecified: Secondary | ICD-10-CM

## 2023-12-07 DIAGNOSIS — I749 Embolism and thrombosis of unspecified artery: Secondary | ICD-10-CM

## 2023-12-08 ENCOUNTER — Other Ambulatory Visit: Payer: Self-pay | Admitting: Internal Medicine

## 2023-12-08 ENCOUNTER — Encounter: Payer: Self-pay | Admitting: Internal Medicine

## 2023-12-08 DIAGNOSIS — J42 Unspecified chronic bronchitis: Secondary | ICD-10-CM

## 2023-12-08 MED ORDER — ALBUTEROL SULFATE (2.5 MG/3ML) 0.083% IN NEBU: 2.5000 mg | INHALATION_SOLUTION | Freq: Four times a day (QID) | RESPIRATORY_TRACT | 3 refills | Status: AC | PRN

## 2023-12-10 ENCOUNTER — Encounter (HOSPITAL_COMMUNITY): Payer: Self-pay | Admitting: Vascular Surgery

## 2023-12-10 ENCOUNTER — Other Ambulatory Visit: Payer: Self-pay

## 2023-12-10 NOTE — Progress Notes (Addendum)
PCP - Dr Trena Platt Cardiologist - Dr Nona Dell A-Fib - Dr [name] Pulmonology - Dr Sandrea Hughs  Chest x-ray - 06/13/22 EKG - 09/07/23 Stress Test - 09/19/22 ECHO - 09/19/22 Cardiac Cath - 10/16/04  ICD Pacemaker/Loop - n/a  Sleep Study -  n/a CPAP - none  Diabetes - n/a  Blood Thinner Instructions:  Per MD, patient was instructed to hold Eliquis for 3 days prior to procedure.  Last dose was on 12/08/23 per patient.  Aspirin Instructions: Aspirin was stopped by Dr [name] on 11/17/23.    If you have to Nitroglycerin medication prior to surgery, please call [phone] and report this to a nurse    Anesthesia review: Yes   STOP now taking Aleve, Naproxen, Ibuprofen, Motrin, Advil, Goody's, BC's, all herbal medications, fish oil, and all vitamins.   Coronavirus Screening Do you have any of the following symptoms:  Cough yes/no: No Fever (>100.55F)  yes/no: No Runny nose yes/no: No Sore throat yes/no: No Difficulty breathing/shortness of breath  Yes  Have you traveled in the last 14 days and where? yes/no: No  Patient verbalized understanding of instructions that were given via phone.

## 2023-12-11 ENCOUNTER — Encounter (HOSPITAL_COMMUNITY): Payer: Self-pay | Admitting: Vascular Surgery

## 2023-12-11 NOTE — Progress Notes (Signed)
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 He is s/p aortogram with BLE arteriogram on 12/03/23. Findings included 99% right CFA and 80% left profundus and proximal SFA stenosis. Bilateral femoral endarterectomies recommended, starting first with the right side.  History includes former smoker (quit 01/31/98; +smokeless tobacco), COPD, HTN, HLD, CAD (s/p CABG 10/17/04: LIMA-LAD, right RA-CX, SVG-RCA), afib/flutter (aflutter ablation 11/21/16), PVD, CKD, colitis, PTSD, hearing aids.   Last visit with primary cardiologist Dr. Diona Browner was on 02/06/2023.  Patient had overall low risk stress test in October 2023 but with possible inferior distribution infarct scar with mild peri-infarct ischemia, although variable soft tissue attenuation also present.  Echo showed EF 55 to 60% with no regional wall motion abnormalities  Imdur up-titrated but would continue medical therapy unless he symptomatic on medical therapy. Since then he had EP cardiologist follow-up with Dr. Ladona Ridgel on 11/17/23. Recent monitor for palpitations showed 6% A-fib burden.  On beta-blocker therapy.  Previous a flutter ablation in 2017.  Options discussed, and he opted for Multaq.  He was also started on Eliquis (instead of ASA). He denied anginal symptoms.  Dr. Ladona Ridgel noted up coming PVD evaluation by Dr. Chestine Spore.    He is a same day work-up. He has had recent cardiology follow-up. He has not yet started Multaq while awaiting insurance approval. He reported instructions to hold Eliquis 3 days prior to surgery. Anesthesia team to evaluate on the day of surgery.   VS: Ht 6' (1.829 m)   Wt 95.3 kg   BMI 28.48 kg/m   BP Readings from Last 3 Encounters:  12/03/23 135/66  11/17/23 134/80  11/11/23 137/72   Pulse Readings from Last 3 Encounters:  12/03/23 60  11/17/23 65  11/11/23 60     PROVIDERS: Anabel Halon, MD is PCP  Nona Dell, MD is primary cardiologist [name], MD is EP Sherald Hess, MD is vascular surgeon   LABS: Most recent lab results in [facility] include: Lab Results  Component Value Date   WBC 6.7 06/25/2023   HGB 13.6 12/03/2023   HCT 40.0 12/03/2023   PLT 155 06/25/2023   GLUCOSE 101 (H) 12/03/2023   CHOL 226 (H) 11/03/2023   TRIG 203 (H) 11/03/2023   HDL 47 11/03/2023   LDLCALC 142 (H) 11/03/2023   ALT 15 11/03/2023   AST 21 11/03/2023   NA 133 (L) 12/03/2023   K 4.2 12/03/2023   CL 93 (L) 12/03/2023   CREATININE 1.90 (H) 12/03/2023   BUN 34 (H) 12/03/2023   CO2 28 11/03/2023   TSH 4.470 02/03/2023   HGBA1C 5.9 (H) 06/25/2023   Creatinine 1.57 -  2.04 per [facility] labs from 10/30/22 - 12/03/23.    PFTs 06/19/22: FVC 2.83 (57%), post 3.45 (70%). FEV1 1.33 (36%), post 1.79 (49%). DLCO unc/cor 18.19 (64%).   EKG: 09/07/23:  Sinus rhythm with Premature ventricular complexes RSR' or QR pattern in V1 suggests right ventricular conduction delay ST & T wave abnormality, consider anterior ischemia Abnormal ECG When compared with ECG of 25-Mar-2020 18:49, PREVIOUS ECG IS PRESENT No significant change since last tracing Confirmed by Arvilla Meres [phone]) on 09/07/2023 11:36:21 PM  CV: Long term (6 days 20 hours) Zio monitor 11/03/23: Predominant rhythm is sinus with prolonged PR interval, heart rate ranging from 51 bpm up to 96 bpm and average heart rate 67 bpm. Paroxysmal atrial fibrillation was noted representing 6% total rhythm burden.  Longest episode lasted for 3 hours and 57 minutes. There were rare PACs including atrial couplets and triplets representing less than 1% total beats. There were occasional PVCs representing 3.7% total beats with  otherwise rare ventricular couplets and triplets as well as limited episodes of ventricular bigeminy and trigeminy. No pauses or high degree heart block.  Some Wenckebach conduction was noted.    Echo 09/19/22: IMPRESSIONS   1. Left ventricular ejection fraction, by estimation, is 55 to 60%. The  left ventricle has normal function. The left ventricle has no regional  wall motion abnormalities. There is mild left ventricular hypertrophy.  Left ventricular diastolic parameters  were normal.   2. Right ventricular systolic function is normal. The right ventricular  size is normal.   3. The mitral valve is normal in structure. No evidence of mitral valve  regurgitation. No evidence of mitral stenosis.   4. The aortic valve has an indeterminant number of cusps. There is mild  calcification of the aortic valve. There is mild thickening of the aortic  valve. Aortic valve regurgitation is not visualized. No aortic stenosis is  present.   5. Aortic dilatation noted. There is mild dilatation of the aortic root,  measuring 40 mm.   6. The inferior vena cava is normal in size with greater than 50%  respiratory variability, suggesting right atrial pressure of 3 mmHg.    Nuclear stress test 09/19/22:   The study is low risk.   Small to moderate size moderate intensity inferior defect with mild reversibility and normal wall motion. Finding may represent prior inferior infarct with mild peri-infarct ischemia, however given preserved wall motion also consider slight differences in diaphragmatic attenuation artifact. Either finding would support low risk.   Left ventricular function is normal. Nuclear stress EF: 57 %. End diastolic cavity size is normal.    US Carotid 03/08/20: IMPRESSION: Color duplex indicates moderate heterogeneous and calcified plaque, with no hemodynamically significant stenosis by duplex criteria in the extracranial cerebrovascular circulation.   Past Medical History:   Diagnosis Date   A-fib [facility])    tx by Dr [name]   Atrial flutter [facility])    Diagnosed by ECG August 2015   Bilateral hearing loss 12/02/2018   no hearing aids   CKD (chronic kidney disease)    Colitis    COPD (chronic obstructive pulmonary disease) (HCC)    Coronary atherosclerosis of native coronary artery    Multivessel status post CABG   Essential hypertension    Hyperlipidemia    Insomnia    Peripheral vascular disease (HCC) 11/11/2023   PTSD (post-traumatic stress disorder)     Past Surgical History:  Procedure Laterality Date   ABDOMINAL AORTOGRAM W/LOWER EXTREMITY Bilateral 12/03/2023   Procedure: ABDOMINAL AORTOGRAM W/LOWER EXTREMITY;  Surgeon: Cephus Shelling, MD;  [facility];  Service: Cardiovascular;  Laterality: Bilateral;   COLONOSCOPY  01/29/2005   Dr. Jena Gauss; rectal polyp s/p polypectomy, otherwise normal.  Pathology with tubulovillous adenoma.   COLONOSCOPY  02/06/2008   Dr. Jena Gauss; minimal internal hemorrhoids, diminutive polyp in the splenic flexure s/p cold biopsy removal, otherwise normal.  Pathology with benign polypoid colonic mucosa.   COLONOSCOPY WITH PROPOFOL N/A 08/30/2015   Procedure: COLONOSCOPY WITH  PROPOFOL at cecum at 0814; withdrawal time=8 minutes;  Surgeon: Corbin Ade, MD; internal hemorrhoids-likely source of hematochezia, otherwise normal exam.  Repeat in 5 years.   COLONOSCOPY WITH PROPOFOL N/A 01/28/2021   Procedure: COLONOSCOPY WITH PROPOFOL;  Surgeon: Corbin Ade, MD;  [facility];  Service: Endoscopy;  Laterality: N/A;  am appt   CORONARY ARTERY BYPASS GRAFT  2005   Dr. Zenaida Niece Trigt: LIMA to LAD, right radial to circumflex, SVG to RCA   CORONARY ARTERY BYPASS GRAFT  10/16/2004   ELECTROPHYSIOLOGIC STUDY N/A 11/21/2016   Procedure: A-Flutter Ablation;  Surgeon: Marinus Maw, MD;  [facility];  Service: Cardiovascular;  Laterality: N/A;   TEE WITHOUT CARDIOVERSION N/A 11/21/2016    Procedure: TRANSESOPHAGEAL ECHOCARDIOGRAM (TEE);  Surgeon: Thurmon Fair, MD;  Location: [facility] ENDOSCOPY;  Service: Cardiovascular;  Laterality: N/A;    MEDICATIONS: No current facility-administered medications for this encounter.    albuterol (PROVENTIL) (2.5 MG/3ML) 0.083% nebulizer solution   albuterol (VENTOLIN HFA) 108 (90 Base) MCG/ACT inhaler   ALPRAZolam (XANAX) 0.5 MG tablet   amLODipine (NORVASC) 5 MG tablet   apixaban (ELIQUIS) 5 MG TABS tablet   budesonide-formoterol (SYMBICORT) 160-4.5 MCG/ACT inhaler   Cholecalciferol (VITAMIN D3) 125 MCG (5000 UT) CAPS   Evolocumab (REPATHA SURECLICK) 140 MG/ML SOAJ   ezetimibe-simvastatin (VYTORIN) 10-40 MG tablet   fenofibrate 160 MG tablet   fish oil-omega-3 fatty acids 1000 MG capsule   losartan-hydrochlorothiazide (HYZAAR) 100-12.5 MG tablet   metoprolol tartrate (LOPRESSOR) 50 MG tablet   nitroGLYCERIN (NITROSTAT) 0.4 MG SL tablet   zolpidem (AMBIEN) 10 MG tablet   amoxicillin-clavulanate (AUGMENTIN) 875-125 MG tablet   aspirin EC 81 MG tablet   dronedarone (MULTAQ) 400 MG tablet   Respiratory Therapy Supplies (NEBULIZER/TUBING/MOUTHPIECE) KIT   He is not currently on Augmentin.   Shonna Chock, PA-C Surgical Short Stay/Anesthesiology [facility] Phone [phone] [facility] Phone [phone] 12/11/2023 12:10 PM

## 2023-12-11 NOTE — Anesthesia Preprocedure Evaluation (Signed)
Anesthesia Evaluation  Patient identified by MRN, date of birth, ID band Patient awake    Reviewed: Allergy & Precautions, NPO status , Patient's Chart, lab work & pertinent test results, reviewed documented beta blocker date and time   Airway Mallampati: III  TM Distance: >3 FB Neck ROM: Full    Dental  (+) Dental Advisory Given, Edentulous Upper, Missing, Poor Dentition   Pulmonary asthma , sleep apnea (noncompliant w/ cpap) , COPD,  COPD inhaler, former smoker Quit smoking 2005, 33 pack year history    Pulmonary exam normal breath sounds clear to auscultation       Cardiovascular hypertension (102/71 preop), Pt. on medications and Pt. on home beta blockers + CAD and +CHF (LVEF 35-40%)  Normal cardiovascular exam+ Cardiac Defibrillator  Rhythm:Regular Rate:Normal   CAD (unchanged 3V CAD w/ CTO RCA, borderline CX, medical therapy 01/2022), chronic combined systolic and diastolic CHF, mixed ischemic/NICM, ICD ([employer]. 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 Left ventricular ejection fraction, by estimation, is 35 to 40%. Left  ventricular ejection fraction by 3D volume is 42 %. The left ventricle has  moderately decreased function. The left ventricle demonstrates regional  wall motion abnormalities (see  scoring diagram/findings for description). The left ventricular internal  cavity size was mildly dilated. There is moderate concentric left  ventricular hypertrophy. Indeterminate diastolic filling due to E-A  fusion. There is akinesis of the left  ventricular, basal-mid inferior wall and inferolateral wall. There is  moderate hypokinesis of the left ventricular, basal-mid inferoseptal wall.  The average left ventricular global longitudinal strain is -14.7 %. The  global longitudinal strain is  abnormal.   2. Right ventricular  systolic function is normal. The right ventricular  size is normal. Tricuspid regurgitation signal is inadequate for assessing  PA pressure.   3. Left atrial size was mildly dilated.   4. The mitral valve is normal in structure. No evidence of mitral valve  regurgitation.   5. The aortic valve is tricuspid. There is mild thickening of the aortic  valve. Aortic valve regurgitation is mild. Aortic valve sclerosis is  present, with no evidence of aortic valve stenosis.   6. Aortic dilatation noted. There is mild dilatation of the aortic root,  measuring 44 mm. There is mild dilatation of the ascending aorta,  measuring 40 mm.   7. The inferior vena cava is normal in size with greater than 50%  respiratory variability, suggesting right atrial pressure of 3 mmHg.  - Comparison(s): No significant change from prior study. Prior images  reviewed side by side. Echocardiogram done 12/09/22 showed an EF of 30-35%.   Long term monitor 01/17/2022 - 01/18/2022: 1. NSR with sinus brady and sinus tachycardia 2. Multifocal PVC's and PACs 3. One run of sustained VT around 160/min 4. Multiple runs of NSVT 5. AVWB was present 6. No prolonged pauses 7. No atrial fib 8. Noise artifact is present     Right/left cath 01/23/2022:   Mid LAD-1 lesion is 10% stenosed.   Mid LAD-2 lesion is 5% stenosed.   Mid RCA lesion is 100% stenosed.   2nd Mrg lesion is 60% stenosed.   Prox Cx lesion is 70% stenosed.   Mid Cx to Dist Cx lesion is 40% stenosed.     Neuro/Psych  PSYCHIATRIC DISORDERS Anxiety Depression    negative neurological ROS     GI/Hepatic Neg liver ROS, PUD,GERD  Controlled and Medicated,,  Endo/Other  Hypothyroidism    Renal/GU Renal Insufficiency and CRFRenal diseaseCr 1.76  negative genitourinary   Musculoskeletal negative musculoskeletal ROS (+)    Abdominal  (+) + obese  Peds  Hematology negative hematology ROS (+)   Anesthesia Other Findings    Reproductive/Obstetrics negative OB ROS                             Anesthesia Physical Anesthesia Plan  ASA: 4  Anesthesia Plan: General   Post-op Pain Management:    Induction: Intravenous  PONV Risk Score and Plan:   Airway Management Planned: Oral ETT  Additional Equipment: None  Intra-op Plan:   Post-operative Plan: Extubation in OR  Informed Consent: I have reviewed the patients History and Physical, chart, labs and discussed the procedure including the risks, benefits and alternatives for the proposed anesthesia with the patient or authorized representative who has indicated his/her understanding and acceptance.     Dental advisory given  Plan Discussed with: CRNA  Anesthesia Plan Comments: (Procedure will likely interfere with ICD function.  Device should be programmed:  Tachy therapies disabled )       Anesthesia Quick Evaluation

## 2023-12-14 ENCOUNTER — Encounter (HOSPITAL_COMMUNITY)
Admission: RE | Disposition: A | Discharge status: Home / Self Care | Payer: Self-pay | Source: Home / Self Care | Attending: Vascular Surgery

## 2023-12-14 ENCOUNTER — Inpatient Hospital Stay (HOSPITAL_COMMUNITY)
Admission: RE | Admit: 2023-12-14 | Discharge: 2023-12-15 | DRG: 253 | Disposition: A | Discharge status: Home / Self Care | Payer: Medicare HMO | Attending: Vascular Surgery | Admitting: Vascular Surgery

## 2023-12-14 ENCOUNTER — Inpatient Hospital Stay (HOSPITAL_COMMUNITY): Payer: Medicare HMO | Admitting: Vascular Surgery

## 2023-12-14 ENCOUNTER — Inpatient Hospital Stay (HOSPITAL_COMMUNITY): Payer: Self-pay | Admitting: Vascular Surgery

## 2023-12-14 ENCOUNTER — Other Ambulatory Visit: Payer: Self-pay

## 2023-12-14 ENCOUNTER — Encounter (HOSPITAL_COMMUNITY): Payer: Self-pay | Admitting: Vascular Surgery

## 2023-12-14 DIAGNOSIS — Z87891 Personal history of nicotine dependence: Secondary | ICD-10-CM | POA: Diagnosis not present

## 2023-12-14 DIAGNOSIS — H9193 Unspecified hearing loss, bilateral: Secondary | ICD-10-CM | POA: Diagnosis not present

## 2023-12-14 DIAGNOSIS — E785 Hyperlipidemia, unspecified: Secondary | ICD-10-CM | POA: Diagnosis present

## 2023-12-14 DIAGNOSIS — F4310 Post-traumatic stress disorder, unspecified: Secondary | ICD-10-CM | POA: Diagnosis not present

## 2023-12-14 DIAGNOSIS — Z951 Presence of aortocoronary bypass graft: Secondary | ICD-10-CM

## 2023-12-14 DIAGNOSIS — Z5971 Insufficient health insurance coverage: Secondary | ICD-10-CM | POA: Diagnosis not present

## 2023-12-14 DIAGNOSIS — Z79899 Other long term (current) drug therapy: Secondary | ICD-10-CM

## 2023-12-14 DIAGNOSIS — I4891 Unspecified atrial fibrillation: Secondary | ICD-10-CM | POA: Diagnosis not present

## 2023-12-14 DIAGNOSIS — Z7951 Long term (current) use of inhaled steroids: Secondary | ICD-10-CM

## 2023-12-14 DIAGNOSIS — Z7982 Long term (current) use of aspirin: Secondary | ICD-10-CM | POA: Diagnosis not present

## 2023-12-14 DIAGNOSIS — I2510 Atherosclerotic heart disease of native coronary artery without angina pectoris: Secondary | ICD-10-CM | POA: Diagnosis present

## 2023-12-14 DIAGNOSIS — J449 Chronic obstructive pulmonary disease, unspecified: Secondary | ICD-10-CM | POA: Diagnosis present

## 2023-12-14 DIAGNOSIS — I70211 Atherosclerosis of native arteries of extremities with intermittent claudication, right leg: Principal | ICD-10-CM

## 2023-12-14 DIAGNOSIS — I493 Ventricular premature depolarization: Secondary | ICD-10-CM | POA: Diagnosis present

## 2023-12-14 DIAGNOSIS — I70219 Atherosclerosis of native arteries of extremities with intermittent claudication, unspecified extremity: Principal | ICD-10-CM | POA: Diagnosis present

## 2023-12-14 DIAGNOSIS — I749 Embolism and thrombosis of unspecified artery: Secondary | ICD-10-CM

## 2023-12-14 DIAGNOSIS — N1832 Chronic kidney disease, stage 3b: Secondary | ICD-10-CM

## 2023-12-14 DIAGNOSIS — I129 Hypertensive chronic kidney disease with stage 1 through stage 4 chronic kidney disease, or unspecified chronic kidney disease: Secondary | ICD-10-CM

## 2023-12-14 DIAGNOSIS — I743 Embolism and thrombosis of arteries of the lower extremities: Secondary | ICD-10-CM | POA: Diagnosis not present

## 2023-12-14 DIAGNOSIS — I739 Peripheral vascular disease, unspecified: Secondary | ICD-10-CM | POA: Diagnosis not present

## 2023-12-14 DIAGNOSIS — Z8249 Family history of ischemic heart disease and other diseases of the circulatory system: Secondary | ICD-10-CM | POA: Diagnosis not present

## 2023-12-14 DIAGNOSIS — Z811 Family history of alcohol abuse and dependence: Secondary | ICD-10-CM

## 2023-12-14 DIAGNOSIS — D62 Acute posthemorrhagic anemia: Secondary | ICD-10-CM | POA: Diagnosis not present

## 2023-12-14 DIAGNOSIS — Z888 Allergy status to other drugs, medicaments and biological substances status: Secondary | ICD-10-CM

## 2023-12-14 HISTORY — DX: Unspecified atrial fibrillation: I48.91

## 2023-12-14 HISTORY — DX: Post-traumatic stress disorder, unspecified: F43.10

## 2023-12-14 HISTORY — DX: Chronic kidney disease, unspecified: N18.9

## 2023-12-14 HISTORY — PX: ENDARTERECTOMY FEMORAL: SHX5804

## 2023-12-14 LAB — CBC
HCT: 41.7 % (ref 39.0–52.0)
Hemoglobin: 13.6 g/dL (ref 13.0–17.0)
MCH: 30.6 pg (ref 26.0–34.0)
MCHC: 32.6 g/dL (ref 30.0–36.0)
MCV: 93.9 fL (ref 80.0–100.0)
Platelets: 164 10*3/uL (ref 150–400)
RBC: 4.44 MIL/uL (ref 4.22–5.81)
RDW: 12.2 % (ref 11.5–15.5)
WBC: 7.3 10*3/uL (ref 4.0–10.5)
nRBC: 0 % (ref 0.0–0.2)

## 2023-12-14 LAB — POCT I-STAT 7, (LYTES, BLD GAS, ICA,H+H)
Acid-Base Excess: 1 mmol/L (ref 0.0–2.0)
Bicarbonate: 26.7 mmol/L (ref 20.0–28.0)
Calcium, Ion: 1.2 mmol/L (ref 1.15–1.40)
HCT: 30 % — ABNORMAL LOW (ref 39.0–52.0)
Hemoglobin: 10.2 g/dL — ABNORMAL LOW (ref 13.0–17.0)
O2 Saturation: 98 %
Potassium: 4.5 mmol/L (ref 3.5–5.1)
Sodium: 135 mmol/L (ref 135–145)
TCO2: 28 mmol/L (ref 22–32)
pCO2 arterial: 48.2 mm[Hg] — ABNORMAL HIGH (ref 32–48)
pH, Arterial: 7.351 (ref 7.35–7.45)
pO2, Arterial: 115 mm[Hg] — ABNORMAL HIGH (ref 83–108)

## 2023-12-14 LAB — COMPREHENSIVE METABOLIC PANEL
ALT: 16 U/L (ref 0–44)
AST: 24 U/L (ref 15–41)
Albumin: 3.7 g/dL (ref 3.5–5.0)
Alkaline Phosphatase: 18 U/L — ABNORMAL LOW (ref 38–126)
Anion gap: 11 (ref 5–15)
BUN: 27 mg/dL — ABNORMAL HIGH (ref 8–23)
CO2: 25 mmol/L (ref 22–32)
Calcium: 9.2 mg/dL (ref 8.9–10.3)
Chloride: 98 mmol/L (ref 98–111)
Creatinine, Ser: 2.1 mg/dL — ABNORMAL HIGH (ref 0.61–1.24)
GFR, Estimated: 33 mL/min — ABNORMAL LOW (ref >60)
Glucose, Bld: 102 mg/dL — ABNORMAL HIGH (ref 70–99)
Potassium: 4.4 mmol/L (ref 3.5–5.1)
Sodium: 134 mmol/L — ABNORMAL LOW (ref 135–145)
Total Bilirubin: 0.8 mg/dL (ref 0.0–1.2)
Total Protein: 6.3 g/dL — ABNORMAL LOW (ref 6.5–8.1)

## 2023-12-14 LAB — GLUCOSE, CAPILLARY: Glucose-Capillary: 212 mg/dL — ABNORMAL HIGH (ref 70–99)

## 2023-12-14 LAB — URINALYSIS, ROUTINE W REFLEX MICROSCOPIC
Bilirubin Urine: NEGATIVE
Glucose, UA: 50 mg/dL — AB
Hgb urine dipstick: NEGATIVE
Ketones, ur: NEGATIVE mg/dL
Leukocytes,Ua: NEGATIVE
Nitrite: NEGATIVE
Protein, ur: NEGATIVE mg/dL
Specific Gravity, Urine: 1.011 (ref 1.005–1.030)
pH: 6 (ref 5.0–8.0)

## 2023-12-14 LAB — SURGICAL PCR SCREEN
MRSA, PCR: NEGATIVE
Staphylococcus aureus: NEGATIVE

## 2023-12-14 LAB — ABO/RH: ABO/RH(D): O POS

## 2023-12-14 LAB — POCT ACTIVATED CLOTTING TIME
Activated Clotting Time: 245 s
Activated Clotting Time: 210 s

## 2023-12-14 LAB — PREPARE RBC (CROSSMATCH)

## 2023-12-14 LAB — PROTIME-INR
INR: 1.1 (ref 0.8–1.2)
Prothrombin Time: 14 s (ref 11.4–15.2)

## 2023-12-14 LAB — APTT: aPTT: 26 s (ref 24–36)

## 2023-12-14 SURGERY — ENDARTERECTOMY, FEMORAL
Anesthesia: General | Site: Leg Upper | Laterality: Right

## 2023-12-14 MED ORDER — LABETALOL HCL 5 MG/ML IV SOLN: 10.0000 mg | INTRAVENOUS | Status: DC | PRN

## 2023-12-14 MED ORDER — EPHEDRINE 5 MG/ML INJ
INTRAVENOUS | Status: AC
  Filled 2023-12-14: qty 5

## 2023-12-14 MED ORDER — ZOLPIDEM TARTRATE 5 MG PO TABS
5.0000 mg | ORAL_TABLET | Freq: Every evening | ORAL | Status: DC | PRN
  Administered 2023-12-14: 5 mg via ORAL
  Filled 2023-12-14: qty 1

## 2023-12-14 MED ORDER — AMLODIPINE BESYLATE 5 MG PO TABS
5.0000 mg | ORAL_TABLET | Freq: Every day | ORAL | Status: DC
  Filled 2023-12-14: qty 1

## 2023-12-14 MED ORDER — MEPERIDINE HCL 25 MG/ML IJ SOLN
6.2500 mg | INTRAMUSCULAR | Status: DC | PRN
Start: 2023-12-14 — End: 2023-12-14

## 2023-12-14 MED ORDER — SENNOSIDES-DOCUSATE SODIUM 8.6-50 MG PO TABS: 1.0000 | ORAL_TABLET | Freq: Every evening | ORAL | Status: DC | PRN

## 2023-12-14 MED ORDER — ALUM & MAG HYDROXIDE-SIMETH 200-200-20 MG/5ML PO SUSP: 15.0000 mL | ORAL | Status: DC | PRN

## 2023-12-14 MED ORDER — EZETIMIBE-SIMVASTATIN 10-40 MG PO TABS
1.0000 | ORAL_TABLET | Freq: Every day | ORAL | Status: DC
  Filled 2023-12-14: qty 1

## 2023-12-14 MED ORDER — LIDOCAINE 2% (20 MG/ML) 5 ML SYRINGE
INTRAMUSCULAR | Status: AC
  Filled 2023-12-14: qty 5

## 2023-12-14 MED ORDER — 0.9 % SODIUM CHLORIDE (POUR BTL) OPTIME
TOPICAL | Status: DC | PRN
  Administered 2023-12-14: 2000 mL

## 2023-12-14 MED ORDER — ACETAMINOPHEN 325 MG PO TABS: 325.0000 mg | ORAL_TABLET | ORAL | Status: DC | PRN

## 2023-12-14 MED ORDER — SODIUM CHLORIDE 0.9 % IV SOLN: INTRAVENOUS | Status: DC

## 2023-12-14 MED ORDER — HYDROCHLOROTHIAZIDE 12.5 MG PO TABS
12.5000 mg | ORAL_TABLET | Freq: Every day | ORAL | Status: DC
  Filled 2023-12-14: qty 1

## 2023-12-14 MED ORDER — POTASSIUM CHLORIDE CRYS ER 20 MEQ PO TBCR: 20.0000 meq | EXTENDED_RELEASE_TABLET | Freq: Every day | ORAL | Status: DC | PRN

## 2023-12-14 MED ORDER — CEFAZOLIN SODIUM-DEXTROSE 2-4 GM/100ML-% IV SOLN
2.0000 g | INTRAVENOUS | Status: AC
  Administered 2023-12-14: 2 g via INTRAVENOUS
  Filled 2023-12-14: qty 100

## 2023-12-14 MED ORDER — MAGNESIUM SULFATE 2 GM/50ML IV SOLN: 2.0000 g | Freq: Every day | INTRAVENOUS | Status: DC | PRN

## 2023-12-14 MED ORDER — PROPOFOL 10 MG/ML IV BOLUS
INTRAVENOUS | Status: DC | PRN
  Administered 2023-12-14: 150 mg via INTRAVENOUS

## 2023-12-14 MED ORDER — ACETAMINOPHEN 160 MG/5ML PO SOLN: 325.0000 mg | ORAL | Status: DC | PRN

## 2023-12-14 MED ORDER — CHLORHEXIDINE GLUCONATE 0.12 % MT SOLN
15.0000 mL | Freq: Once | OROMUCOSAL | Status: AC
  Administered 2023-12-14: 15 mL via OROMUCOSAL
  Filled 2023-12-14: qty 15

## 2023-12-14 MED ORDER — PHENYLEPHRINE 80 MCG/ML (10ML) SYRINGE FOR IV PUSH (FOR BLOOD PRESSURE SUPPORT)
PREFILLED_SYRINGE | INTRAVENOUS | Status: DC | PRN
  Administered 2023-12-14: 80 ug via INTRAVENOUS

## 2023-12-14 MED ORDER — LACTATED RINGERS IV SOLN: INTRAVENOUS | Status: DC

## 2023-12-14 MED ORDER — PHENYLEPHRINE 80 MCG/ML (10ML) SYRINGE FOR IV PUSH (FOR BLOOD PRESSURE SUPPORT)
PREFILLED_SYRINGE | INTRAVENOUS | Status: AC
  Filled 2023-12-14: qty 10

## 2023-12-14 MED ORDER — ROCURONIUM BROMIDE 10 MG/ML (PF) SYRINGE
PREFILLED_SYRINGE | INTRAVENOUS | Status: DC | PRN
  Administered 2023-12-14: 20 mg via INTRAVENOUS
  Administered 2023-12-14: 60 mg via INTRAVENOUS
  Administered 2023-12-14: 20 mg via INTRAVENOUS

## 2023-12-14 MED ORDER — ONDANSETRON HCL 4 MG/2ML IJ SOLN: 4.0000 mg | Freq: Four times a day (QID) | INTRAMUSCULAR | Status: DC | PRN

## 2023-12-14 MED ORDER — OXYCODONE-ACETAMINOPHEN 5-325 MG PO TABS
1.0000 | ORAL_TABLET | ORAL | Status: DC | PRN
Start: 2023-12-14 — End: 2023-12-15
  Administered 2023-12-14: 1 via ORAL
  Filled 2023-12-14: qty 1

## 2023-12-14 MED ORDER — CHLORHEXIDINE GLUCONATE CLOTH 2 % EX PADS: 6.0000 | MEDICATED_PAD | Freq: Once | CUTANEOUS | Status: DC

## 2023-12-14 MED ORDER — ALBUMIN HUMAN 5 % IV SOLN: INTRAVENOUS | Status: DC | PRN

## 2023-12-14 MED ORDER — HEMOSTATIC AGENTS (NO CHARGE) OPTIME
TOPICAL | Status: DC | PRN
  Administered 2023-12-14: 1 via TOPICAL

## 2023-12-14 MED ORDER — BISACODYL 5 MG PO TBEC: 5.0000 mg | DELAYED_RELEASE_TABLET | Freq: Every day | ORAL | Status: DC | PRN

## 2023-12-14 MED ORDER — OXYCODONE HCL 5 MG/5ML PO SOLN: 5.0000 mg | Freq: Once | ORAL | Status: DC | PRN

## 2023-12-14 MED ORDER — CEFAZOLIN SODIUM-DEXTROSE 2-4 GM/100ML-% IV SOLN
2.0000 g | Freq: Three times a day (TID) | INTRAVENOUS | Status: AC
  Administered 2023-12-14 – 2023-12-15 (×2): 2 g via INTRAVENOUS
  Filled 2023-12-14 (×2): qty 100

## 2023-12-14 MED ORDER — FENTANYL CITRATE (PF) 250 MCG/5ML IJ SOLN
INTRAMUSCULAR | Status: DC | PRN
  Administered 2023-12-14: 100 ug via INTRAVENOUS
  Administered 2023-12-14 (×2): 50 ug via INTRAVENOUS

## 2023-12-14 MED ORDER — ONDANSETRON HCL 4 MG/2ML IJ SOLN
INTRAMUSCULAR | Status: DC | PRN
  Administered 2023-12-14: 4 mg via INTRAVENOUS

## 2023-12-14 MED ORDER — DEXAMETHASONE SODIUM PHOSPHATE 10 MG/ML IJ SOLN
INTRAMUSCULAR | Status: DC | PRN
  Administered 2023-12-14: 10 mg via INTRAVENOUS

## 2023-12-14 MED ORDER — ROCURONIUM BROMIDE 10 MG/ML (PF) SYRINGE
PREFILLED_SYRINGE | INTRAVENOUS | Status: AC
  Filled 2023-12-14: qty 10

## 2023-12-14 MED ORDER — ORAL CARE MOUTH RINSE: 15.0000 mL | Freq: Once | OROMUCOSAL | Status: AC

## 2023-12-14 MED ORDER — PANTOPRAZOLE SODIUM 40 MG PO TBEC
40.0000 mg | DELAYED_RELEASE_TABLET | Freq: Every day | ORAL | Status: DC
  Filled 2023-12-14: qty 1

## 2023-12-14 MED ORDER — HEPARIN 6000 UNIT IRRIGATION SOLUTION
Status: DC | PRN
  Administered 2023-12-14: 1

## 2023-12-14 MED ORDER — DOCUSATE SODIUM 100 MG PO CAPS
100.0000 mg | ORAL_CAPSULE | Freq: Every day | ORAL | Status: DC
  Filled 2023-12-14: qty 1

## 2023-12-14 MED ORDER — ONDANSETRON HCL 4 MG/2ML IJ SOLN: INTRAMUSCULAR | Status: DC | PRN

## 2023-12-14 MED ORDER — LIDOCAINE 2% (20 MG/ML) 5 ML SYRINGE
INTRAMUSCULAR | Status: DC | PRN
  Administered 2023-12-14: 100 mg via INTRAVENOUS

## 2023-12-14 MED ORDER — HEPARIN SODIUM (PORCINE) 1000 UNIT/ML IJ SOLN
INTRAMUSCULAR | Status: AC
  Filled 2023-12-14: qty 10

## 2023-12-14 MED ORDER — DEXAMETHASONE SODIUM PHOSPHATE 10 MG/ML IJ SOLN
INTRAMUSCULAR | Status: AC
  Filled 2023-12-14: qty 1

## 2023-12-14 MED ORDER — MIDAZOLAM HCL 2 MG/2ML IJ SOLN
INTRAMUSCULAR | Status: DC | PRN
  Administered 2023-12-14: 2 mg via INTRAVENOUS

## 2023-12-14 MED ORDER — HEMOSTATIC AGENTS (NO CHARGE) OPTIME
TOPICAL | Status: DC | PRN
  Administered 2023-12-14: 2 via TOPICAL

## 2023-12-14 MED ORDER — METOPROLOL TARTRATE 50 MG PO TABS
50.0000 mg | ORAL_TABLET | Freq: Two times a day (BID) | ORAL | Status: DC
  Administered 2023-12-14 – 2023-12-15 (×2): 50 mg via ORAL
  Filled 2023-12-14 (×2): qty 1

## 2023-12-14 MED ORDER — FENOFIBRATE 160 MG PO TABS
160.0000 mg | ORAL_TABLET | Freq: Every day | ORAL | Status: DC
  Filled 2023-12-14 (×2): qty 1

## 2023-12-14 MED ORDER — HEPARIN SODIUM (PORCINE) 1000 UNIT/ML IJ SOLN
INTRAMUSCULAR | Status: DC | PRN
  Administered 2023-12-14: 2000 [IU] via INTRAVENOUS
  Administered 2023-12-14: 10000 [IU] via INTRAVENOUS
  Administered 2023-12-14: 3000 [IU] via INTRAVENOUS

## 2023-12-14 MED ORDER — HYDROMORPHONE HCL 1 MG/ML IJ SOLN: 0.5000 mg | INTRAMUSCULAR | Status: DC | PRN

## 2023-12-14 MED ORDER — ONDANSETRON HCL 4 MG/2ML IJ SOLN: 4.0000 mg | Freq: Once | INTRAMUSCULAR | Status: DC | PRN

## 2023-12-14 MED ORDER — HYDRALAZINE HCL 20 MG/ML IJ SOLN: 5.0000 mg | INTRAMUSCULAR | Status: DC | PRN

## 2023-12-14 MED ORDER — PHENOL 1.4 % MT LIQD: 1.0000 | OROMUCOSAL | Status: DC | PRN

## 2023-12-14 MED ORDER — LOSARTAN POTASSIUM 50 MG PO TABS
100.0000 mg | ORAL_TABLET | Freq: Every day | ORAL | Status: DC
  Filled 2023-12-14: qty 2

## 2023-12-14 MED ORDER — SODIUM CHLORIDE 0.9 % IV SOLN: 500.0000 mL | Freq: Once | INTRAVENOUS | Status: DC | PRN

## 2023-12-14 MED ORDER — EPHEDRINE SULFATE-NACL 50-0.9 MG/10ML-% IV SOSY
PREFILLED_SYRINGE | INTRAVENOUS | Status: DC | PRN
  Administered 2023-12-14 (×10): 5 mg via INTRAVENOUS
  Administered 2023-12-14: 10 mg via INTRAVENOUS
  Administered 2023-12-14: 5 mg via INTRAVENOUS

## 2023-12-14 MED ORDER — PROPOFOL 10 MG/ML IV BOLUS
INTRAVENOUS | Status: AC
  Filled 2023-12-14: qty 20

## 2023-12-14 MED ORDER — OXYCODONE HCL 5 MG PO TABS: 5.0000 mg | ORAL_TABLET | Freq: Once | ORAL | Status: DC | PRN

## 2023-12-14 MED ORDER — GUAIFENESIN-DM 100-10 MG/5ML PO SYRP: 15.0000 mL | ORAL_SOLUTION | ORAL | Status: DC | PRN

## 2023-12-14 MED ORDER — LIDOCAINE HCL (PF) 1 % IJ SOLN
INTRAMUSCULAR | Status: AC
  Filled 2023-12-14: qty 30

## 2023-12-14 MED ORDER — HEPARIN SODIUM (PORCINE) 5000 UNIT/ML IJ SOLN
5000.0000 [IU] | Freq: Three times a day (TID) | INTRAMUSCULAR | Status: DC
  Administered 2023-12-15: 5000 [IU] via SUBCUTANEOUS
  Filled 2023-12-14: qty 1

## 2023-12-14 MED ORDER — ASPIRIN 81 MG PO TBEC
81.0000 mg | DELAYED_RELEASE_TABLET | ORAL | Status: DC
  Administered 2023-12-15: 81 mg via ORAL
  Filled 2023-12-14: qty 1

## 2023-12-14 MED ORDER — HEPARIN 6000 UNIT IRRIGATION SOLUTION
Status: AC
  Filled 2023-12-14: qty 500

## 2023-12-14 MED ORDER — ATORVASTATIN CALCIUM 40 MG PO TABS
40.0000 mg | ORAL_TABLET | Freq: Every day | ORAL | Status: DC
  Administered 2023-12-14: 40 mg via ORAL
  Filled 2023-12-14: qty 1

## 2023-12-14 MED ORDER — PROTAMINE SULFATE 10 MG/ML IV SOLN
INTRAVENOUS | Status: DC | PRN
  Administered 2023-12-14: 50 mg via INTRAVENOUS

## 2023-12-14 MED ORDER — FENTANYL CITRATE (PF) 100 MCG/2ML IJ SOLN: 25.0000 ug | INTRAMUSCULAR | Status: DC | PRN

## 2023-12-14 MED ORDER — MIDAZOLAM HCL 2 MG/2ML IJ SOLN
INTRAMUSCULAR | Status: AC
  Filled 2023-12-14: qty 2

## 2023-12-14 MED ORDER — EZETIMIBE 10 MG PO TABS
10.0000 mg | ORAL_TABLET | Freq: Every day | ORAL | Status: DC
  Administered 2023-12-14: 10 mg via ORAL
  Filled 2023-12-14: qty 1

## 2023-12-14 MED ORDER — PROTAMINE SULFATE 10 MG/ML IV SOLN
INTRAVENOUS | Status: AC
  Filled 2023-12-14: qty 5

## 2023-12-14 MED ORDER — ONDANSETRON HCL 4 MG/2ML IJ SOLN
INTRAMUSCULAR | Status: AC
  Filled 2023-12-14: qty 2

## 2023-12-14 MED ORDER — LOSARTAN POTASSIUM-HCTZ 100-12.5 MG PO TABS: 1.0000 | ORAL_TABLET | Freq: Every day | ORAL | Status: DC

## 2023-12-14 MED ORDER — PHENYLEPHRINE HCL-NACL 20-0.9 MG/250ML-% IV SOLN
INTRAVENOUS | Status: DC | PRN
  Administered 2023-12-14: 40 ug/min via INTRAVENOUS

## 2023-12-14 MED ORDER — ACETAMINOPHEN 650 MG RE SUPP: 325.0000 mg | RECTAL | Status: DC | PRN

## 2023-12-14 MED ORDER — METOPROLOL TARTRATE 5 MG/5ML IV SOLN: 2.0000 mg | INTRAVENOUS | Status: DC | PRN

## 2023-12-14 MED ORDER — SODIUM CHLORIDE 0.9 % IV SOLN: 10.0000 mL/h | Freq: Once | INTRAVENOUS | Status: DC

## 2023-12-14 MED ORDER — FENTANYL CITRATE (PF) 250 MCG/5ML IJ SOLN
INTRAMUSCULAR | Status: AC
  Filled 2023-12-14: qty 5

## 2023-12-14 MED ORDER — SUGAMMADEX SODIUM 200 MG/2ML IV SOLN
INTRAVENOUS | Status: DC | PRN
  Administered 2023-12-14: 200 mg via INTRAVENOUS

## 2023-12-14 SURGICAL SUPPLY — 34 items
BAG COUNTER SPONGE SURGICOUNT (BAG) ×1 IMPLANT
CANISTER SUCT 3000ML PPV (MISCELLANEOUS) ×1 IMPLANT
CATH EMB 3FR 40 (CATHETERS) IMPLANT
CLIP TI MEDIUM 24 (CLIP) ×1 IMPLANT
CLIP TI WIDE RED SMALL 24 (CLIP) ×1 IMPLANT
DERMABOND ADVANCED .7 DNX12 (GAUZE/BANDAGES/DRESSINGS) ×1 IMPLANT
DERMABOND ADVANCED .7 DNX6 (GAUZE/BANDAGES/DRESSINGS) IMPLANT
DRAIN CHANNEL 15F RND FF W/TCR (WOUND CARE) IMPLANT
ELECT REM PT RETURN 9FT ADLT (ELECTROSURGICAL) ×1
ELECTRODE REM PT RTRN 9FT ADLT (ELECTROSURGICAL) ×1 IMPLANT
EVACUATOR SILICONE 100CC (DRAIN) IMPLANT
GLOVE BIO SURGEON STRL SZ7.5 (GLOVE) ×1 IMPLANT
GLOVE BIOGEL PI IND STRL 8 (GLOVE) ×1 IMPLANT
GOWN STRL REUS W/ TWL LRG LVL3 (GOWN DISPOSABLE) ×3 IMPLANT
GOWN STRL REUS W/ TWL XL LVL3 (GOWN DISPOSABLE) ×2 IMPLANT
GRAFT VASC PATCH XENOSURE 1X14 (Vascular Products) IMPLANT
HEMOSTAT SNOW SURGICEL 2X4 (HEMOSTASIS) IMPLANT
HEMOSTAT SPONGE AVITENE ULTRA (HEMOSTASIS) IMPLANT
KIT BASIN OR (CUSTOM PROCEDURE TRAY) ×1 IMPLANT
KIT TURNOVER KIT B (KITS) ×1 IMPLANT
LOOP VESSEL MINI RED (MISCELLANEOUS) IMPLANT
NS IRRIG 1000ML POUR BTL (IV SOLUTION) ×2 IMPLANT
PACK PERIPHERAL VASCULAR (CUSTOM PROCEDURE TRAY) ×1 IMPLANT
PAD ARMBOARD 7.5X6 YLW CONV (MISCELLANEOUS) ×2 IMPLANT
POWDER SURGICEL 3.0 GRAM (HEMOSTASIS) IMPLANT
SUT MNCRL AB 4-0 PS2 18 (SUTURE) ×1 IMPLANT
SUT PROLENE 5 0 C 1 24 (SUTURE) ×1 IMPLANT
SUT PROLENE 6 0 BV (SUTURE) IMPLANT
SUT SILK 2-0 18XBRD TIE 12 (SUTURE) IMPLANT
SUT VIC AB 2-0 CT1 TAPERPNT 27 (SUTURE) ×1 IMPLANT
SUT VIC AB 3-0 SH 27X BRD (SUTURE) ×1 IMPLANT
TOWEL GREEN STERILE (TOWEL DISPOSABLE) ×1 IMPLANT
TRAY FOLEY MTR SLVR 16FR STAT (SET/KITS/TRAYS/PACK) IMPLANT
WATER STERILE IRR 1000ML POUR (IV SOLUTION) ×1 IMPLANT

## 2023-12-14 NOTE — Anesthesia Procedure Notes (Signed)
Arterial Line Insertion Start/End1/20/2025 10:13 AM Performed by: CRNA  Preanesthetic checklist: patient identified, IV checked, site marked, risks and benefits discussed, surgical consent, monitors and equipment checked, pre-op evaluation, timeout performed and anesthesia consent Left, radial was placed Catheter size: 20 G Hand hygiene performed  and maximum sterile barriers used   Attempts: 1 Procedure performed without using ultrasound guided technique. Following insertion, dressing applied and Biopatch. Patient tolerated the procedure well with no immediate complications.

## 2023-12-14 NOTE — Progress Notes (Signed)
Pt admitted to rm 25 from PACU. Initiated tele. Oriented pt to the unit. VSS. Call bell within reach.   Lawson Radar, RN

## 2023-12-14 NOTE — Discharge Instructions (Signed)
[facility]  Discharge instructions  Lower Extremity Vascular Surgery  Please refer to the following instruction for your post-procedure care. Your surgeon or physician assistant will discuss any changes with you.  Activity  You are encouraged to walk as much as you can. You can slowly return to normal activities during the month after your surgery. Avoid strenuous activity and heavy lifting until your doctor tells you it's OK. Avoid activities such as vacuuming or swinging a golf club. Do not drive until your doctor give the OK and you are no longer taking prescription pain medications. It is also normal to have difficulty with sleep habits, eating and bowel movement after surgery. These will go away with time.  Bathing/Showering  Shower daily after you go home. Do not soak in a bathtub, hot tub, or swim until the incision heals completely.  Incision Care  Clean your incision with mild soap and water. Shower every day. Pat the area dry with a clean towel. You do not need a bandage unless otherwise instructed. Do not apply any ointments or creams to your incision. If you have open wounds you will be instructed how to care for them or a visiting nurse may be arranged for you. If you have staples or sutures along your incision they will be removed at your post-op appointment. You may have skin glue on your incision. Do not peel it off. It will come off on its own in about one week.  Wash the groin wound with soap and water daily and pat dry. (No tub bath-only shower)  Then put a dry gauze or washcloth in the groin to keep this area dry to help prevent wound infection.  Do this daily and as needed.  Do not use Vaseline or neosporin on your incisions.  Only use soap and water on your incisions and then protect and keep dry.  Diet  Resume your normal diet. There are no special food restrictions following this procedure. A low fat/ low cholesterol diet is  recommended for all patients with vascular disease. In order to heal from your surgery, it is CRITICAL to get adequate nutrition. Your body requires vitamins, minerals, and protein. Vegetables are the best source of vitamins and minerals. Vegetables also provide the perfect balance of protein. Processed food has little nutritional value, so try to avoid this.  Medications  Resume taking all your medications unless your doctor or physician assistant tells you not to. If your incision is causing pain, you may take over-the-counter pain relievers such as acetaminophen (Tylenol). If you were prescribed a stronger pain medication, please aware these medication can cause nausea and constipation. Prevent nausea by taking the medication with a snack or meal. Avoid constipation by drinking plenty of fluids and eating foods with high amount of fiber, such as fruits, vegetables, and grains. Take Colace 100 mg (an over-the-counter stool softener) twice a day as needed for constipation.  Do not take Tylenol if you are taking prescription pain medications.  Follow Up  Our office will schedule a follow up appointment 2-3 weeks following discharge.  Please call us immediately for any of the following conditions  .Severe or worsening pain in your legs or feet while at rest or while walking .Increase pain, redness, warmth, or drainage (pus) from your incision site(s) . Fever of 101 degree or higher . The swelling in your leg with the bypass suddenly worsens and becomes more painful than when you were in the hospital .  If you have been instructed to feel your graft pulse then you should do so every day. If you can no longer feel this pulse, call the office immediately. Not all patients are given this instruction. .  Leg swelling is common after leg bypass surgery.  The swelling should improve over a few months following surgery. To improve the swelling, you may elevate your legs above the level of your heart while  you are sitting or resting. Your surgeon or physician assistant may ask you to apply an ACE wrap or wear compression (TED) stockings to help to reduce swelling.  Reduce your risk of vascular disease  Stop smoking. If you would like help call [facility] at 1-800-QUIT-NOW 570-542-2068) or [place] at [phone].  . Manage your cholesterol . Maintain a desired weight . Control your diabetes weight . Control your diabetes . Keep your blood pressure down .  If you have any questions, please call the office at [phone]

## 2023-12-14 NOTE — Progress Notes (Signed)
Pt refused 4 meds scheduled at 1800. Pt stated that his bp was ok. He did not want finofibrate and protonix at this time.   Lawson Radar, RN

## 2023-12-14 NOTE — H&P (Signed)
History and Physical Interval Note:  12/14/2023 10:43 AM  Dennis [name]  has presented today for surgery, with the diagnosis of Embolism and thrombosis femoral artery.  The various methods of treatment have been discussed with the patient and family. After consideration of risks, benefits and other options for treatment, the patient has consented to  Procedure(s): RIGHT ENDARTERECTOMY FEMORAL (Right) as a surgical intervention.  The patient's history has been reviewed, patient examined, no change in status, stable for surgery.  I have reviewed the patient's chart and labs.  Questions were answered to the patient's satisfaction.    Plan right femoral endarterectomy for disabling claudication after angiogram last week.  Cephus Shelling     Patient name: Dennis [name]         MRN: [id number]        DOB: 1951-05-08          Sex: male   REASON FOR VISIT: PAD   HPI: Dennis [name] is a 73 y.o. male with history of atrial flutter, COPD, hypertension, hyperlipidemia that presents for follow-up of his claudication.  Previously seen by Dr. [name] on 08/06/2022 for bilateral lower extremity claudication.  This has been progressive over number of years.  At the time he had an ABI of 0.67 on the right and 0.69 on the left.   Today he states his legs are doing worse.  His wife states he cannot walk 75 feet without stopping due to severe leg cramping.  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  He has tried to put off intervention if at all possible.  No rest pain or tissue loss.  He states both legs are equally bad.          Past Medical History:  Diagnosis Date   Atrial flutter (HCC)      Diagnosed by ECG August 2015   Colitis     COPD (chronic obstructive pulmonary disease) (HCC)     Coronary atherosclerosis of native coronary artery      Multivessel status post CABG   Essential hypertension      Hyperlipidemia     Insomnia                      Past Surgical History:  Procedure Laterality Date   COLONOSCOPY   01/29/2005    Dr. Jena Gauss; rectal polyp s/p polypectomy, otherwise normal.  Pathology with tubulovillous adenoma.   COLONOSCOPY   02/06/2008    Dr. Jena Gauss; minimal internal hemorrhoids, diminutive polyp in the splenic flexure s/p cold biopsy removal, otherwise normal.  Pathology with benign polypoid colonic mucosa.   COLONOSCOPY WITH PROPOFOL N/A 08/30/2015    Procedure: COLONOSCOPY WITH PROPOFOL at cecum at 0814; withdrawal time=8 minutes;  Surgeon: Corbin Ade, MD; internal hemorrhoids-likely source of hematochezia, otherwise normal exam.  Repeat in 5 years.   COLONOSCOPY WITH PROPOFOL N/A 01/28/2021    Procedure: COLONOSCOPY WITH PROPOFOL;  Surgeon: Corbin Ade, MD;  Location: [facility];  Service: Endoscopy;  Laterality: N/A;  am appt   CORONARY ARTERY BYPASS GRAFT   2005    Dr. Zenaida Niece Trigt: LIMA to LAD, right radial to circumflex, SVG to RCA   CORONARY ARTERY BYPASS GRAFT   10/16/2004   ELECTROPHYSIOLOGIC STUDY N/A 11/21/2016    Procedure: A-Flutter Ablation;  Surgeon: Marinus Maw, MD;  Location: [facility] INVASIVE [facility] LAB;  Service: Cardiovascular;  Laterality: N/A;   TEE WITHOUT CARDIOVERSION N/A 11/21/2016    Procedure: TRANSESOPHAGEAL ECHOCARDIOGRAM (TEE);  Surgeon: Thurmon Fair, MD;  Location: [facility] ENDOSCOPY;  Service: Cardiovascular;  Laterality: N/A;                    Family History  Problem Relation Age of Onset   Hypertension Mother     Alcohol abuse Mother     Alcohol abuse Father     Colon cancer Neg Hx             SOCIAL HISTORY: Social History             Tobacco Use   Smoking status: Former      Current packs/day: 0.00      Average packs/day: 2.0 packs/day for 27.0 years (54.0 ttl pk-yrs)      Types: Cigarettes      Start date: 02/01/1971      Quit date: 01/31/1998      Years since quitting: 25.7   Smokeless tobacco: Current      Types:  Chew  Substance Use Topics   Alcohol use: Yes      Alcohol/week: 0.0 standard drinks of alcohol      Comment: Drinks beer daily, typically 2-4 a day. Also 4 oz red wine an evening.      Allergies           Allergies  Allergen Reactions   Ace Inhibitors Shortness Of Breath and Cough   Enalapril Cough      Pt has been switched to Valsartan and is tolerating the different class.   Dexamethasone Itching      Pt was prescribed this in december   Alirocumab Rash and Other (See Comments)      blisters   Clotrimazole-Betamethasone Rash   Prednisone Rash                   Current Outpatient Medications  Medication Sig Dispense Refill   albuterol (VENTOLIN HFA) 108 (90 Base) MCG/ACT inhaler Inhale 2 puffs into the lungs every 4 (four) hours as needed for wheezing. 18 g 5   ALPRAZolam (XANAX) 0.5 MG tablet Take 1 tablet (0.5 mg total) by mouth 2 (two) times daily as needed for anxiety. 30 tablet 3   aspirin EC 81 MG tablet Take 1 tablet (81 mg total) by mouth daily. (Patient taking differently: Take 81 mg by mouth 2 (two) times a week.) 30 tablet 11   budesonide-formoterol (SYMBICORT) 160-4.5 MCG/ACT inhaler Inhale 2 puffs into the lungs 2 (two) times daily. 1 each 5   Evolocumab (REPATHA SURECLICK) 140 MG/ML SOAJ Inject 140 mg into the skin every 14 (fourteen) days. 6 mL 1   ezetimibe-simvastatin (VYTORIN) 10-40 MG tablet Take 1 tablet by mouth daily. 90 tablet 1   fenofibrate 160 MG tablet TAKE 1 TABLET BY MOUTH EVERY DAY 90 tablet 1   fish oil-omega-3 fatty acids 1000 MG capsule Take 1 g by mouth in the morning and at bedtime.       isosorbide mononitrate (IMDUR) 30 MG 24 hr tablet Take 1 tablet (30 mg total) by mouth at bedtime. 90 tablet 3   ketoconazole (NIZORAL) 2 % cream Apply 1 Application topically daily. 30 g 0   levalbuterol (XOPENEX) 1.25 MG/3ML nebulizer solution Take 1.25 mg by nebulization every 6 (six) hours as needed for wheezing or shortness of breath. 360 mL 1    losartan-hydrochlorothiazide (HYZAAR) 100-25 MG tablet Take  1 tablet by mouth daily. 90 tablet 1   metoprolol tartrate (LOPRESSOR) 50 MG tablet TAKE 1 TABLET BY MOUTH TWICE A DAY 180 tablet 1   nitroGLYCERIN (NITROSTAT) 0.4 MG SL tablet PLACE 1 TABLET (0.4 MG TOTAL) UNDER THE TONGUE EVERY 5 (FIVE) MINUTES AS NEEDED. 25 tablet 3   omega-3 acid ethyl esters (LOVAZA) 1 g capsule Take by mouth.       Respiratory Therapy Supplies (NEBULIZER/TUBING/MOUTHPIECE) KIT Nebulizer tubing and mouthpiece 1 kit 0   tamsulosin (FLOMAX) 0.4 MG CAPS capsule Take 1 capsule (0.4 mg total) by mouth daily. 90 capsule 3   Vitamin D, Ergocalciferol, (DRISDOL) 1.25 MG (50000 UNIT) CAPS capsule TAKE 1 CAPSULE (50,000 UNITS TOTAL) BY MOUTH EVERY 7 (SEVEN) DAYS 12 capsule 1   zolpidem (AMBIEN) 10 MG tablet Take 1 tablet (10 mg total) by mouth at bedtime as needed. for sleep 30 tablet 3      No current facility-administered medications for this visit.        REVIEW OF SYSTEMS:  [X]  denotes positive finding, [ ]  denotes negative finding Cardiac   Comments:  Chest pain or chest pressure:      Shortness of breath upon exertion:      Short of breath when lying flat:      Irregular heart rhythm:             Vascular      Pain in calf, thigh, or hip brought on by ambulation: x    Pain in feet at night that wakes you up from your sleep:       Blood clot in your veins:      Leg swelling:              Pulmonary      Oxygen at home:      Productive cough:       Wheezing:              Neurologic      Sudden weakness in arms or legs:       Sudden numbness in arms or legs:       Sudden onset of difficulty speaking or slurred speech:      Temporary loss of vision in one eye:       Problems with dizziness:              Gastrointestinal      Blood in stool:       Vomited blood:              Genitourinary      Burning when urinating:       Blood in urine:             Psychiatric      Major depression:               Hematologic      Bleeding problems:      Problems with blood clotting too easily:             Skin      Rashes or ulcers:             Constitutional      Fever or chills:          PHYSICAL EXAM: There were no vitals filed for this visit.   GENERAL: The patient is a well-nourished male, in no acute distress. The vital signs are documented above. CARDIAC: There is a regular rate and rhythm.  VASCULAR:  Left femoral pulse 1+ palpable No palpable right femoral pulse No palpable pedal pulses No tissue loss PULMONARY: No respiratory distress ABDOMEN: Soft and non-tender. MUSCULOSKELETAL: There are no major deformities or cyanosis. NEUROLOGIC: No focal weakness or paresthesias are detected. SKIN: There are no ulcers or rashes noted. PSYCHIATRIC: The patient has a normal affect.   DATA:    ABI today 0.50 right and 0.69 left (previously 0.67 and 0.69)   Assessment/Plan:   73 y.o. male with history of atrial flutter, COPD, hypertension, hyperlipidemia that presents for follow-up of his lower extremity claudication.  Previously seen by Dr. [name] on 08/06/2022 for bilateral lower extremity claudication.    His symptoms have been progressively worse over the last year.  I again discussed we should reserve intervention until his symptoms are disabling and lifestyle limiting.  He is really at a point where he is disabled by his legs.  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  His ABIs have dropped now 0.5 on the right and stable at 0.69 on the left.  I have offered him lower extremity arteriogram from a transfemoral approach.  This would have to be from the left groin as I cannot feel a right femoral pulse.  Discussed that that he would likely require open surgical intervention after reviewing an old CT from 2020 looks like he has significant common femoral  disease at least on the right with iliac disease as well. I discussed risk and benefits including risk of transfemoral access, vessel injury and bleeding.  All questions answered. I discussed this being done under moderate sedation and if he has endovascular options we will proceed at that time otherwise will need open surgical intervention.     Cephus Shelling, MD [facility] [place] Office: [phone]

## 2023-12-14 NOTE — Op Note (Signed)
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 Cephus Shelling, MD  Assistant: Nathanial Rancher, PA  Indications: 73 year-old male seen with disabling bilateral lower extremity claudication.  Underwent angiogram showing severe bilateral common femoral disease.  He presents today for planned right common femoral endarterectomy after risk benefits discussed.  An assistant was needed given the complexity of the case and also for endarterectomy and sewing the patch angioplasty.  Findings: Severely calcified arteries with difficulty finding vascular clamp sites.  Ultimately endarterectomy was performed of the right external iliac artery onto the common femoral onto the profunda all the way out to the branch points.  There was high-grade near occlusive plaque in the common femoral as well as the profunda.  Also endarterectomized the proximal SFA.  A long bovine pericardial patch was sewn from the common femoral onto the distal profunda.  Brisk Doppler signals in the foot at completion with excellent profunda signal.  Anesthesia: General  Details: Patient was taken to the operating room after informed consent was obtained.  Placed on operative table in supine position.  General endotracheal anesthesia was induced.  The right groin was then prepped and draped in standard sterile fashion.  Antibiotics were given and timeout performed.  I then made a transverse incision in the right groin above inguinal crease.  Dissected down with Bovie cautery and used cerebellar retractors for added visualization.  The right femoral sheath was opened longitudinally.  I dissected out the common femoral artery and then went under the  inguinal ligament and ligated the circumflex vein.  I did dissect very proximal on the external iliac to find a reasonable clamp zone and even here it was quite calcified.  We then dissected distally onto the SFA and profunda.  The SFA was not clampable.  The profunda was dissected all the way out to the primary branch points and controlled all these with Vesseloops.  I then gave the patient 100 units/kg IV heparin.  The Vesseloops used on the profunda and I used a Henley clamp on the external iliac.  I had to reposition this multiple times as we still had a palpable pulse in the right common femoral artery.  I then finally was able to get a clamp site where there was no pulse in the groin.  I then opened the common femoral with 11 blade scalpel and Potts scissors.  Immediately had robust backbleeding from what look like the SFA and profunda.  I tried to reposition my clamps multiple times ultimately had to use #3 Fogarty's for balloon control.  I then used a Runner, broadcasting/film/video performed endarterectomy of the common femoral up to the external iliac artery.  I endarterectomized the proximal SFA.  I then carried the endarterectomy onto the profunda all the way out to the very distal branch points where the artery finally got soft.  We then cleaned out the endarterectomy site with heparinized saline.  We had good backbleeding from the main profunda branch.  We had good pulsatile inflow.  We did have a trickle flow backbleeding from the SFA.  A large long bovine pericardial patch was brought on the field.  This was sewn in place with 5-0 Prolene with parachute technique onto the profunda and then onto the common femoral with the help of my assistant.  We de-aired this prior to completion.  There was an area on the anterior wall of the SFA that we had to reinforce with a pledget.  We had good signals in the right foot.  Protamine was given for reversal.  The groin was copiously irrigated out we used Surgicel snow for  hemostasis.  We then remove this and used Surgicel powder in the groin was closed in multiple layers of 2-0 Vicryl, 3-0 Vicryl, 4-0 Monocryl and Dermabond.  Complication: None  Condition: Stable  Cephus Shelling, MD [facility] [place] Office: [phone]   Cephus Shelling

## 2023-12-14 NOTE — Transfer of Care (Signed)
Immediate Anesthesia Transfer of Care Note  Patient: Dennis [name]  Procedure(s) Performed: RIGHT ENDARTERECTOMY  COMMON FEMORAL PROFUNDOPLASTY WITH XENSURE BOVINE PATCH (Right: Leg Upper)  Patient Location: PACU  Anesthesia Type:General  Level of Consciousness: awake, alert , oriented, and patient cooperative  Airway & Oxygen Therapy: Patient Spontanous Breathing and Patient connected to nasal cannula oxygen  Post-op Assessment: Report given to RN and Post -op Vital signs reviewed and stable  Post vital signs: Reviewed and stable  Last Vitals:  Vitals Value Taken Time  BP 103/70 12/14/23 1500  Temp    Pulse 73 12/14/23 1506  Resp 22 12/14/23 1506  SpO2 93 % 12/14/23 1506  Vitals shown include unfiled device data.  Last Pain:  Vitals:   12/14/23 0921  PainSc: 0-No pain         Complications: No notable events documented.

## 2023-12-14 NOTE — Anesthesia Procedure Notes (Signed)
Procedure Name: Intubation Date/Time: 12/14/2023 11:41 AM  Performed by: Ammie Dalton, CRNAPre-anesthesia Checklist: Patient identified, Emergency Drugs available, Suction available and Patient being monitored Patient Re-evaluated:Patient Re-evaluated prior to induction Oxygen Delivery Method: Circle System Utilized Preoxygenation: Pre-oxygenation with 100% oxygen Induction Type: IV induction Ventilation: Mask ventilation without difficulty and Oral airway inserted - appropriate to patient size Laryngoscope Size: Mac and 4 Grade View: Grade I Tube type: Oral Tube size: 7.5 mm Number of attempts: 1 Airway Equipment and Method: Stylet and Oral airway Placement Confirmation: ETT inserted through vocal cords under direct vision, positive ETCO2 and breath sounds checked- equal and bilateral Secured at: 23 cm Tube secured with: Tape Dental Injury: Teeth and Oropharynx as per pre-operative assessment

## 2023-12-15 ENCOUNTER — Encounter (HOSPITAL_COMMUNITY): Payer: Self-pay | Admitting: Vascular Surgery

## 2023-12-15 LAB — TYPE AND SCREEN
ABO/RH(D): O POS
Antibody Screen: NEGATIVE
Unit division: 0

## 2023-12-15 LAB — LIPID PANEL
Cholesterol: 80 mg/dL (ref 0–200)
HDL: 44 mg/dL (ref >40)
LDL Cholesterol: 24 mg/dL (ref 0–99)
Total CHOL/HDL Ratio: 1.8 {ratio}
Triglycerides: 59 mg/dL (ref <150)
VLDL: 12 mg/dL (ref 0–40)

## 2023-12-15 LAB — BPAM RBC
Blood Product Expiration Date: 202502102359
ISSUE DATE / TIME: 202501201415
Unit Type and Rh: 5100

## 2023-12-15 LAB — CBC
HCT: 32.4 % — ABNORMAL LOW (ref 39.0–52.0)
Hemoglobin: 10.9 g/dL — ABNORMAL LOW (ref 13.0–17.0)
MCH: 30.9 pg (ref 26.0–34.0)
MCHC: 33.6 g/dL (ref 30.0–36.0)
MCV: 91.8 fL (ref 80.0–100.0)
Platelets: 149 10*3/uL — ABNORMAL LOW (ref 150–400)
RBC: 3.53 MIL/uL — ABNORMAL LOW (ref 4.22–5.81)
RDW: 12.4 % (ref 11.5–15.5)
WBC: 10.5 10*3/uL (ref 4.0–10.5)
nRBC: 0 % (ref 0.0–0.2)

## 2023-12-15 LAB — BASIC METABOLIC PANEL
Anion gap: 9 (ref 5–15)
BUN: 31 mg/dL — ABNORMAL HIGH (ref 8–23)
CO2: 25 mmol/L (ref 22–32)
Calcium: 8.6 mg/dL — ABNORMAL LOW (ref 8.9–10.3)
Chloride: 96 mmol/L — ABNORMAL LOW (ref 98–111)
Creatinine, Ser: 1.89 mg/dL — ABNORMAL HIGH (ref 0.61–1.24)
GFR, Estimated: 38 mL/min — ABNORMAL LOW (ref >60)
Glucose, Bld: 156 mg/dL — ABNORMAL HIGH (ref 70–99)
Potassium: 4.2 mmol/L (ref 3.5–5.1)
Sodium: 130 mmol/L — ABNORMAL LOW (ref 135–145)

## 2023-12-15 MED ORDER — EZETIMIBE 10 MG PO TABS
10.0000 mg | ORAL_TABLET | Freq: Every day | ORAL | Status: DC
Start: 2023-12-15 — End: 2023-12-15

## 2023-12-15 MED ORDER — APIXABAN 5 MG PO TABS: 5.0000 mg | ORAL_TABLET | Freq: Two times a day (BID) | ORAL | Status: DC

## 2023-12-15 MED ORDER — OXYCODONE-ACETAMINOPHEN 5-325 MG PO TABS: 1.0000 | ORAL_TABLET | Freq: Four times a day (QID) | ORAL | 0 refills | Status: DC | PRN

## 2023-12-15 MED ORDER — SIMVASTATIN 20 MG PO TABS
40.0000 mg | ORAL_TABLET | Freq: Every day | ORAL | Status: DC
Start: 2023-12-15 — End: 2023-12-15

## 2023-12-15 NOTE — Anesthesia Postprocedure Evaluation (Signed)
Anesthesia Post Note  Patient: Dennis [name]  Procedure(s) Performed: RIGHT ENDARTERECTOMY  COMMON FEMORAL PROFUNDOPLASTY WITH XENSURE BOVINE PATCH (Right: Leg Upper)     Patient location during evaluation: PACU Anesthesia Type: General Level of consciousness: awake and alert Pain management: pain level controlled Vital Signs Assessment: post-procedure vital signs reviewed and stable Respiratory status: spontaneous breathing, nonlabored ventilation, respiratory function stable and patient connected to nasal cannula oxygen Cardiovascular status: blood pressure returned to baseline and stable Postop Assessment: no apparent nausea or vomiting Anesthetic complications: no   No notable events documented.  Last Vitals:    Last Pain:   Pain Goal:                   Collene Schlichter

## 2023-12-15 NOTE — Progress Notes (Addendum)
PHARMACIST LIPID MONITORING   Dennis [name] is a 73 y.o. 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  Pharmacy has been consulted to optimize lipid-lowering therapy with the indication of secondary prevention for clinical ASCVD.  Recent Labs:  Lipid Panel (last 6 months):   Lab Results  Component Value Date   CHOL 80 12/15/2023   TRIG 59 12/15/2023   HDL 44 12/15/2023   CHOLHDL 1.8 12/15/2023   VLDL 12 12/15/2023   LDLCALC 24 12/15/2023    Hepatic function panel (last 6 months):   Lab Results  Component Value Date   AST 24 12/14/2023   ALT 16 12/14/2023   ALKPHOS 18 (L) 12/14/2023   BILITOT 0.8 12/14/2023    SCr (since admission):   Serum creatinine: 1.89 mg/dL (H) 16/10/96 0454 Estimated creatinine clearance: 43.6 mL/min (A)  Current therapy and lipid therapy tolerance Current lipid-lowering therapy: evolocumab, ezetimibe-simvastatin 10-40mg , fenofibrate Previous lipid-lowering therapies (if applicable): rosuvastatin  Documented or reported allergies or intolerances to lipid-lowering therapies (if applicable): rash to alirocumab, history of statin intolerance due to myalgias   Assessment:   LDL is at goal, already on PCSK9.  Plan:    1.Statin intensity (high intensity recommended for all patients regardless of the LDL):  no changes, on moderate intensity statin but already on evolocumab  2.Add ezetimibe (if any one of the following):   already on  3.Refer to lipid clinic:   No  4.Follow-up with:  Primary care provider - Anabel Halon, MD  5.Follow-up labs after discharge:  No changes in lipid therapy, repeat a lipid panel in one year.      Alphia Moh, PharmD, BCPS, BCCP Clinical Pharmacist  Please check AMION for all [facility] phone numbers After 10:00 PM, call Main Pharmacy [phone]

## 2023-12-15 NOTE — TOC Transition Note (Signed)
Transition of Care [facility]) - Discharge Note Donn Pierini RN, BSN Transitions of Care Unit 4E- RN Case Manager See Treatment Team for direct phone #   Patient Details  Name: Dennis [name] MRN: [id number] Date of Birth: Jan 11, 1951  Transition of Care [facility]) CM/SW Contact:  Darrold Span, RN Phone Number: 12/15/2023, 9:51 AM   Clinical Narrative:    D/C order in for today and pt stable for transition home.  CM was notified by [facility] liaison that [facility] office made referral for [facility] needs- note PT/OT evals done this am with no recommendations for [facility] follow up- [facility] liaison updated on no HH needs at time of discharge.   Pt from home w/ wife, has transportation home. No HH or DME needs noted.    Final next level of care: Home/Self Care Barriers to Discharge: No Barriers Identified   Patient Goals and CMS Choice Patient states their goals for this hospitalization and ongoing recovery are:: wants to recover   Choice offered to / list presented to : NA      Discharge Placement               Home         Discharge Plan and Services Additional resources added to the After Visit Summary for     Discharge Planning Services: CM Consult Post Acute Care Choice: NA          DME Arranged: N/A DME Agency: NA       HH Arranged: NA HH Agency: [facility] Date HH Agency Contacted: 12/15/23 Time HH Agency Contacted: [phone] Representative spoke with at [facility] Agency: Aggie Cosier  Social Drivers of Health (SDOH) Interventions SDOH Screenings   Food Insecurity: No Food Insecurity (12/14/2023)  Housing: Unknown (12/14/2023)  Transportation Needs: No Transportation Needs (12/14/2023)  Utilities: Not At Risk (12/14/2023)  Alcohol Screen: Low Risk  (08/05/2021)  Depression (PHQ2-9): Medium Risk (09/30/2023)  Financial Resource Strain: Medium Risk (08/05/2021)  Physical Activity: Insufficiently Active (08/05/2021)  Social Connections: Unknown (12/14/2023)  Stress:  No Stress Concern Present (08/05/2021)  Tobacco Use: High Risk (12/14/2023)     Readmission Risk Interventions    12/15/2023    9:51 AM  Readmission Risk Prevention Plan  Transportation Screening Complete  Home Care Screening Complete  Medication Review (RN CM) Complete

## 2023-12-15 NOTE — Evaluation (Signed)
Occupational Therapy Evaluation Patient Details Name: Dennis [name] MRN: [id number] DOB: 1951/08/16 Today's Date: 12/15/2023   History of Present Illness Pt is a 73 yr old male who presented 12/14/23 due to a follow-up for his claudication and has decline in the ability to ambulate.  Pt s/p 1/20 R common femoral endarterectomy PMH: COPD, HTN, HLD, atrial flutter   Clinical Impression   Pt at this time presented in room with nursing standing at bedside. He reported at baseline he enjoys hunting and would like to resume once cleared by md. He was able to complete mobility with no AE/DME and no cues with max HR at 83. Eddied then was able to demonstrate LB dressing with MI but reported if he can not complete tasks he will be able to ask his wife. He currently is planning to go back to OP therapies when able. Acute Occupational Therapy signing off. Thank you.        If plan is discharge home, recommend the following: Assist for transportation    Functional Status Assessment  Patient has had a recent decline in their functional status and demonstrates the ability to make significant improvements in function in a reasonable and predictable amount of time.  Equipment Recommendations  None recommended by OT    Recommendations for Other Services       Precautions / Restrictions Precautions Precautions: None Restrictions Weight Bearing Restrictions Per Provider Order: No      Mobility Bed Mobility Overal bed mobility: Independent                  Transfers Overall transfer level: Modified independent Equipment used: None               General transfer comment: increase in time due to RLE soreness      Balance Overall balance assessment: Modified Independent                                         ADL either performed or assessed with clinical judgement   ADL Overall ADL's : Modified independent                                        General ADL Comments: Pt reported if they have any difficulties they will ask wife to complete but just educated about pacing self.     Vision         Perception Perception: Not tested       Praxis Praxis: Not tested       Pertinent Vitals/Pain Pain Assessment Pain Assessment: Faces Faces Pain Scale: Hurts a little bit Pain Location: sore at sx site Pain Descriptors / Indicators: Discomfort Pain Intervention(s): Limited activity within patient's tolerance, Monitored during session     Extremity/Trunk Assessment Upper Extremity Assessment Upper Extremity Assessment: Overall WFL for tasks assessed   Lower Extremity Assessment Lower Extremity Assessment: Overall WFL for tasks assessed (just residual soreness)   Cervical / Trunk Assessment Cervical / Trunk Assessment: Normal   Communication Communication Communication: No apparent difficulties   Cognition Arousal: Alert Behavior During Therapy: WFL for tasks assessed/performed Overall Cognitive Status: Within Functional Limits for tasks assessed  General Comments       Exercises     Shoulder Instructions      Home Living Family/patient expects to be discharged to:: Private residence Living Arrangements: Spouse/significant other Available Help at Discharge: Family Type of Home: House Home Access: Stairs to enter Secretary/administrator of Steps: 6 Entrance Stairs-Rails: Right Home Layout: One level     Bathroom Shower/Tub: Chief Strategy Officer: Standard Bathroom Accessibility: Yes How Accessible: Accessible via walker Home Equipment: Agricultural consultant (2 wheels);BSC/3in1;Shower seat          Prior Functioning/Environment Prior Level of Function : Independent/Modified Independent             Mobility Comments: Pt reports no use of AE/DME          OT Problem List: Decreased activity tolerance;Decreased safety  awareness;Pain      OT Treatment/Interventions:      OT Goals(Current goals can be found in the care plan section) Acute Rehab OT Goals Patient Stated Goal: to go home today OT Goal Formulation: With patient Time For Goal Achievement: 12/29/23 Potential to Achieve Goals: Good  OT Frequency:      Co-evaluation              AM-PAC OT "6 Clicks" Daily Activity     Outcome Measure Help from another person eating meals?: None Help from another person taking care of personal grooming?: None Help from another person toileting, which includes using toliet, bedpan, or urinal?: None Help from another person bathing (including washing, rinsing, drying)?: None Help from another person to put on and taking off regular upper body clothing?: None Help from another person to put on and taking off regular lower body clothing?: None 6 Click Score: 24   End of Session Equipment Utilized During Treatment: Gait belt Nurse Communication: Mobility status  Activity Tolerance: Patient tolerated treatment well Patient left: in chair;with call bell/phone within reach;with nursing/sitter in room  OT Visit Diagnosis: Pain Pain - Right/Left: Right Pain - part of body: Leg                Time: 3244-0102 OT Time Calculation (min): 20 min Charges:  OT General Charges $OT Visit: 1 Visit OT Evaluation $OT Eval Low Complexity: 1 Low  Presley Raddle OTR/L  Acute Rehab Services  [phone] office number   Alphia Moh 12/15/2023, 9:03 AM

## 2023-12-15 NOTE — Progress Notes (Signed)
PT Cancellation Note  Patient Details Name: Dennis [name] MRN: [id number] DOB: 18-Apr-1951   Cancelled Treatment:    Reason Eval/Treat Not Completed: PT screened, no needs identified, will sign off; alerted by OT pt without PT needs.    [name] 12/15/2023, 9:04 AM [name], PT Acute Rehabilitation Services Office:[phone] 12/15/2023

## 2023-12-15 NOTE — Progress Notes (Addendum)
Progress Note    12/15/2023 6:36 AM 1 Day Post-Op  Subjective:  says his foot feels better.  Wants to go home  Afebrile HR 70's NSR 90's-120's systolic 92% RA  Vitals:   12/14/23 2300 12/15/23 0400  BP: 123/76 106/63  Pulse: 77 72  Resp: 10 17  Temp: 97.6 F (36.4 C) 97.6 F (36.4 C)  SpO2: 91% 94%    Physical Exam: General:  no distress Cardiac:  regular Lungs:  non labored Incisions:  right groin incision is clean and dry Extremities:  brisk right DP/PT doppler flow Abdomen:  soft  CBC    Component Value Date/Time   WBC 10.5 12/15/2023 0355   RBC 3.53 (L) 12/15/2023 0355   HGB 10.9 (L) 12/15/2023 0355   HGB 13.8 06/25/2023 0809   HCT 32.4 (L) 12/15/2023 0355   HCT 42.0 06/25/2023 0809   PLT 149 (L) 12/15/2023 0355   PLT 155 06/25/2023 0809   MCV 91.8 12/15/2023 0355   MCV 91 06/25/2023 0809   MCH 30.9 12/15/2023 0355   MCHC 33.6 12/15/2023 0355   RDW 12.4 12/15/2023 0355   RDW 12.7 06/25/2023 0809   LYMPHSABS 2.6 06/25/2023 0809   MONOABS 0.6 03/25/2020 1929   EOSABS 0.1 06/25/2023 0809   BASOSABS 0.1 06/25/2023 0809    BMET    Component Value Date/Time   NA 130 (L) 12/15/2023 0355   NA 136 11/03/2023 0809   K 4.2 12/15/2023 0355   CL 96 (L) 12/15/2023 0355   CO2 25 12/15/2023 0355   GLUCOSE 156 (H) 12/15/2023 0355   BUN 31 (H) 12/15/2023 0355   BUN 32 (H) 11/03/2023 0809   CREATININE 1.89 (H) 12/15/2023 0355   CREATININE 1.51 (H) 11/14/2016 1201   CALCIUM 8.6 (L) 12/15/2023 0355   GFRNONAA 38 (L) 12/15/2023 0355   GFRAA 60 12/26/2020 0916    INR    Component Value Date/Time   INR 1.1 12/14/2023 0822     Intake/Output Summary (Last 24 hours) at 12/15/2023 0636 Last data filed at 12/15/2023 0443 Gross per 24 hour  Intake 2810 ml  Output 2025 ml  Net 785 ml      Assessment/Plan:  73 y.o. male is s/p:  Right common femoral endarterectomy with profundoplasty and endarterectomy of the proximal SFA with bovine pericardial patch  angioplasty   1 Day Post-Op   -pt has brisk doppler flow right DP/PT -acute surgical blood loss anemia-pt tolerating -needs to ambulate this morning -restart Eliquis on 12/17/2023. -DVT prophylaxis:  sq heparin -pt will f/u with Dr. Chestine Spore in 2-3 weeks in [place] clinic and have discussion about left leg revascularization   Doreatha Massed, PA-C [facility] [phone] 12/15/2023 6:36 AM  I have seen and evaluated the patient. I agree with the PA note as documented above.  Postop day 1 status post extensive right femoral endarterectomy including profundoplasty and endarterectomy of the proximal SFA with bovine patch.  Right groin looks good.  He has brisk DP PT signals in the right foot.  Okay to get up and mobilize this morning.  He has voided.  Patient wants discharge and will arrange follow-up in 2 to 3 weeks and reasonable to make sure his groin is healing and will discuss the other leg.  I discussed holding his Eliquis until Thursday.  Call with questions or concerns especially if there is any issues with the right groin incision.  Aspirin statin as well.  Cephus Shelling, MD [facility] [place] Office: [phone]

## 2023-12-15 NOTE — Discharge Summary (Signed)
Discharge Summary     Dennis [name] [birth year] 73 y.o. 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 male with history of atrial flutter, COPD, hypertension, hyperlipidemia that presents for follow-up of his claudication.  Previously seen by Dr. [name] on 08/06/2022 for bilateral lower extremity claudication.  This has been progressive over number of years.  At the time he had an ABI of 0.67 on the right and 0.69 on the left.   Today he states his legs are doing worse.  His wife states he cannot walk 75 feet without stopping due to severe leg cramping.  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  He has tried to put off intervention if at all possible.  No rest pain or tissue loss.  He states both legs are equally bad.  Hospital Course:  The patient was admitted to the hospital and taken to the operating room on 12/14/2023 and underwent: Right common femoral endarterectomy with profundoplasty and endarterectomy of the proximal SFA with bovine pericardial patch angioplasty     Findings: Severely calcified arteries with difficulty finding vascular clamp sites.  Ultimately endarterectomy was performed of the right external iliac artery onto the common femoral onto the profunda all the way out to the branch points.  There was high-grade near occlusive plaque in the common femoral as well as the profunda.  Also endarterectomized the proximal SFA.  A long bovine pericardial patch was sewn from the common femoral onto the distal profunda.  Brisk Doppler signals in the foot at completion with excellent profunda signal.   The pt tolerated the procedure well and was transported to the PACU in  good condition.   By POD 1,  Right groin looks good.  He has brisk DP PT signals in the right foot.  Okay to get up and mobilize this morning.  He has voided.  Patient wants discharge and will arrange follow-up in 2 to 3 weeks and reasonable to make sure his groin is healing and will discuss the other leg.  I discussed holding his Eliquis until Thursday.  Call with questions or concerns especially if there is any issues with the right groin incision.  Aspirin statin as well.    CBC    Component Value Date/Time   WBC 10.5 12/15/2023 0355   RBC 3.53 (L) 12/15/2023 0355   HGB 10.9 (L) 12/15/2023 0355   HGB 13.8 06/25/2023 0809   HCT 32.4 (L) 12/15/2023 0355   HCT 42.0 06/25/2023 0809   PLT 149 (L) 12/15/2023 0355   PLT 155 06/25/2023 0809   MCV 91.8 12/15/2023 0355   MCV 91 06/25/2023 0809   MCH 30.9 12/15/2023 0355   MCHC 33.6 12/15/2023 0355   RDW 12.4 12/15/2023 0355   RDW 12.7 06/25/2023 0809   LYMPHSABS 2.6 06/25/2023 0809   MONOABS 0.6 03/25/2020 1929   EOSABS 0.1 06/25/2023 0809   BASOSABS 0.1 06/25/2023 0809    BMET    Component Value Date/Time   NA 130 (L) 12/15/2023 0355   NA 136 11/03/2023 0809   K 4.2 12/15/2023 0355   CL 96 (L) 12/15/2023 0355   CO2 25 12/15/2023 0355   GLUCOSE 156 (H) 12/15/2023 0355  BUN 31 (H) 12/15/2023 0355   BUN 32 (H) 11/03/2023 0809   CREATININE 1.89 (H) 12/15/2023 0355   CREATININE 1.51 (H) 11/14/2016 1201   CALCIUM 8.6 (L) 12/15/2023 0355   GFRNONAA 38 (L) 12/15/2023 0355   GFRAA 60 12/26/2020 0916     Discharge Instructions     Discharge patient   Complete by: As directed    Discharge home after he has walked in the hallways.   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   Atherosclerosis of lower extremity with claudication (HCC) 12/14/2023   Acute non-recurrent frontal sinusitis 11/24/2023   Paroxysmal atrial fibrillation (HCC) 11/12/2023   Chronic respiratory failure with hypoxia (HCC) 11/12/2023   PAD (peripheral artery disease) (HCC) 09/30/2023   Skin tag 06/30/2023   Prediabetes 02/26/2023   PVD (peripheral vascular disease) with claudication (HCC) 03/13/2022   Acute bronchitis with COPD (HCC) 12/30/2021   Onychomycosis 11/12/2021   Groin rash 07/16/2021   Chronic kidney disease, stage 3b (HCC) 01/02/2021   Benign prostatic hyperplasia with urinary frequency 09/25/2020   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   Anxiety 07/23/2020   Insomnia 07/23/2020   Sensorineural hearing loss (SNHL) of both ears 12/02/2018   PTSD (post-traumatic stress disorder) 11/10/2017   Tinnitus aurium, 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   Atrial flutter (HCC) 11/21/2016   COPD GOLD III with reversibility  10/01/2015   Hematochezia    Diverticulosis of colon without hemorrhage    Hemorrhoid    History of adenomatous polyp of colon 08/14/2015   Rectal bleeding 08/14/2015   Upper airway cough syndrome 08/13/2015   Dyspnea 08/13/2015   Chronic left hip pain 09/17/2014   HLD (hyperlipidemia) 04/08/2010   Essential hypertension, benign 04/08/2010   Coronary atherosclerosis of native coronary artery 04/08/2010   COPD (chronic obstructive pulmonary disease) (HCC) 04/08/2010   Past Medical History:  Diagnosis Date   A-fib [name])    tx by Dr [name]   Atrial flutter [facility])    Diagnosed by ECG August 2015   Bilateral hearing loss 12/02/2018   no hearing aids   CKD (chronic kidney disease)    Colitis    COPD (chronic obstructive pulmonary disease) (HCC)    Coronary atherosclerosis of native coronary artery    Multivessel status post CABG   Essential hypertension    Hyperlipidemia    Insomnia    Peripheral vascular disease (HCC) 11/11/2023    PTSD (post-traumatic stress disorder)      Allergies as of 12/15/2023       Reactions   Ace Inhibitors Shortness Of Breath, Cough   Enalapril Cough   Pt has been switched to Valsartan and is tolerating the different class.   Other    Vein in Right Leg and Right Arm is missing due to a open heart surgery in 2005   Dexamethasone Itching   Pt was prescribed this in december   Alirocumab Rash, Other (See Comments)   Blisters *Praluent   Clotrimazole-betamethasone Rash   Prednisone Rash        Medication List     TAKE these medications    albuterol 108 (90 Base) MCG/ACT inhaler Commonly known as: VENTOLIN HFA Inhale 2 puffs into the lungs every 4 (four) hours as needed for  wheezing.   albuterol (2.5 MG/3ML) 0.083% nebulizer solution Commonly known as: PROVENTIL Take 3 mLs (2.5 mg total) by nebulization every 6 (six) hours as needed for wheezing or shortness of breath.   ALPRAZolam 0.5 MG tablet Commonly known as: XANAX TAKE 1 TABLET BY MOUTH 2 TIMES DAILY AS NEEDED FOR ANXIETY.   amLODipine 5 MG tablet Commonly known as: NORVASC Take 1 tablet (5 mg total) by mouth daily.   apixaban 5 MG Tabs tablet Commonly known as: ELIQUIS Take 1 tablet (5 mg total) by mouth 2 (two) times daily. Start taking on: December 17, 2023 What changed: These instructions start on December 17, 2023. If you are unsure what to do until then, ask your doctor or other care provider.   aspirin EC 81 MG tablet Take 81 mg by mouth every other day. Swallow whole.   budesonide-formoterol 160-4.5 MCG/ACT inhaler Commonly known as: SYMBICORT Inhale 2 puffs into the lungs 2 (two) times daily. What changed: when to take this   ezetimibe-simvastatin 10-40 MG tablet Commonly known as: VYTORIN Take 1 tablet by mouth at bedtime.   fenofibrate 160 MG tablet TAKE 1 TABLET BY MOUTH EVERY DAY   fish oil-omega-3 fatty acids 1000 MG capsule Take 1 g by mouth at bedtime.   losartan-hydrochlorothiazide  100-12.5 MG tablet Commonly known as: HYZAAR Take 1 tablet by mouth daily.   metoprolol tartrate 50 MG tablet Commonly known as: LOPRESSOR TAKE 1 TABLET BY MOUTH TWICE A DAY   Multaq 400 MG tablet Generic drug: dronedarone Take 1 tablet (400 mg total) by mouth 2 (two) times daily with a meal.   Nebulizer/Tubing/Mouthpiece Kit Nebulizer tubing and mouthpiece   nitroGLYCERIN 0.4 MG SL tablet Commonly known as: NITROSTAT PLACE 1 TABLET (0.4 MG TOTAL) UNDER THE TONGUE EVERY 5 (FIVE) MINUTES AS NEEDED.   oxyCODONE-acetaminophen 5-325 MG tablet Commonly known as: Percocet Take 1 tablet by mouth every 6 (six) hours as needed.   Repatha SureClick 140 MG/ML Soaj Generic drug: Evolocumab Inject 140 mg into the skin every 14 (fourteen) days.   Vitamin D3 125 MCG (5000 UT) Caps Take 5,000 Units by mouth daily.   zolpidem 10 MG tablet Commonly known as: AMBIEN TAKE 1 TABLET BY MOUTH AT BEDTIME AS NEEDED FOR SLEEP What changed: when to take this        Discharge Instructions: [facility] Discharge instructions Lower Extremity Bypass Surgery  Please refer to the following instruction for your post-procedure care. Your surgeon or physician assistant will discuss any changes with you.  Activity  You are encouraged to walk as much as you can. You can slowly return to normal activities during the month after your surgery. Avoid strenuous activity and heavy lifting until your doctor tells you it's OK. Avoid activities such as vacuuming or swinging a golf club. Do not drive until your doctor give the OK and you are no longer taking prescription pain medications. It is also normal to have difficulty with sleep habits, eating and bowel movement after surgery. These will go away with time.  Bathing/Showering  You may shower after you go home. Do not soak in a bathtub, hot tub, or swim until the incision heals completely.  Incision Care  Clean your  incision with mild soap and water. Shower every day. Pat the area dry with a clean towel. You do not need a bandage unless otherwise instructed. Do not apply any ointments or creams to your incision. If you have open wounds you  will be instructed how to care for them or a visiting nurse may be arranged for you. If you have staples or sutures along your incision they will be removed at your post-op appointment. You may have skin glue on your incision. Do not peel it off. It will come off on its own in about one week.  Wash the groin wound with soap and water daily and pat dry. (No tub bath-only shower)  Then put a dry gauze or washcloth in the groin to keep this area dry to help prevent wound infection.  Do this daily and as needed.  Do not use Vaseline or neosporin on your incisions.  Only use soap and water on your incisions and then protect and keep dry.  Diet  Resume your normal diet. There are no special food restrictions following this procedure. A low fat/ low cholesterol diet is recommended for all patients with vascular disease. In order to heal from your surgery, it is CRITICAL to get adequate nutrition. Your body requires vitamins, minerals, and protein. Vegetables are the best source of vitamins and minerals. Vegetables also provide the perfect balance of protein. Processed food has little nutritional value, so try to avoid this.  Medications  Resume taking all your medications unless your doctor or Physician Assistant tells you not to. If your incision is causing pain, you may take over-the-counter pain relievers such as acetaminophen (Tylenol). If you were prescribed a stronger pain medication, please aware these medication can cause nausea and constipation. Prevent nausea by taking the medication with a snack or meal. Avoid constipation by drinking plenty of fluids and eating foods with high amount of fiber, such as fruits, vegetables, and grains. Take Colace 100 mg (an over-the-counter stool  softener) twice a day as needed for constipation.  Do not take Tylenol if you are taking prescription pain medications.  RESTART ELIQUIS ON 12/17/2023  Follow Up  Our office will schedule a follow up appointment 2-3 weeks following discharge.  Please call us immediately for any of the following conditions  Severe or worsening pain in your legs or feet while at rest or while walking Increase pain, redness, warmth, or drainage (pus) from your incision site(s) Fever of 101 degree or higher The swelling in your leg with the bypass suddenly worsens and becomes more painful than when you were in the hospital If you have been instructed to feel your graft pulse then you should do so every day. If you can no longer feel this pulse, call the office immediately. Not all patients are given this instruction.  Leg swelling is common after leg bypass surgery.  The swelling should improve over a few months following surgery. To improve the swelling, you may elevate your legs above the level of your heart while you are sitting or resting. Your surgeon or physician assistant may ask you to apply an ACE wrap or wear compression (TED) stockings to help to reduce swelling.  Reduce your risk of vascular disease  Stop smoking. If you would like help call [facility] at 1-800-QUIT-NOW (9473678334) or [place] at [phone].  Manage your cholesterol Maintain a desired weight Control your diabetes weight Control your diabetes Keep your blood pressure down  If you have any questions, please call the office at [phone]   Prescriptions given: 1.  Roxicet #20 No Refill  Restart Eliquis on 12/17/2023  Disposition: home  Patient's condition: is Good  Follow up: 1. [facility] [place] with Dr. Chestine Spore in 2-3 weeks.    Lelon Mast  Tzion Wedel, PA-C [facility] [phone] 12/15/2023  7:46 AM  - For VQI Registry use ---   Post-op:  Wound infection: No  Graft infection:  No  Transfusion: No    If yes, n/a units given New Arrhythmia: No Ipsilateral amputation: No, [ ]  Minor, [ ]  BKA, [ ]  AKA Discharge patency: [x ] Primary, [ ]  Primary assisted, [ ]  Secondary, [ ]  Occluded Patency judged by: [x ] Dopper only, [ ]  Palpable graft pulse, []  Palpable distal pulse, [ ]  ABI inc. > 0.15, [ ]  Duplex Discharge ABI: R not done, L  D/C Ambulatory Status: Ambulatory  Complications: MI: No, [ ]  Troponin only, [ ]  EKG or Clinical CHF: No Resp failure:No, [ ]  Pneumonia, [ ]  Ventilator Chg in renal function: No, [ ]  Inc. Cr > 0.5, [ ]  Temp. Dialysis,  [ ]  Permanent dialysis Stroke: No, [ ]  Minor, [ ]  Major Return to OR: No  Reason for return to OR: [ ]  Bleeding, [ ]  Infection, [ ]  Thrombosis, [ ]  Revision  Discharge medications: Statin use:  yes ASA use:  yes Plavix use:  no Beta blocker use: yes CCB use:  Yes ACEI use:   no ARB use:  yes Coumadin use: no Eliquis:  yes

## 2023-12-16 ENCOUNTER — Telehealth: Payer: Self-pay

## 2023-12-16 NOTE — Transitions of Care (Post Inpatient/ED Visit) (Signed)
12/16/2023  Name: Dennis [name] MRN: [id number] DOB: Jun 30, 1951  Today's TOC FU Call Status: Today's TOC FU Call Status:: Successful TOC FU Call Completed TOC FU Call Complete Date: 12/16/23 Patient's Name and Date of Birth confirmed.  Transition Care Management Follow-up Telephone Call Date of Discharge: 12/15/23 Discharge Facility: [facility]) Type of Discharge: Inpatient Admission How have you been since you were released from the hospital?: Better (Feels better today,) Any questions or concerns?: No  Items Reviewed: Did you receive and understand the discharge instructions provided?: Yes Medications obtained,verified, and reconciled?: Yes (Medications Reviewed) Any new allergies since your discharge?: No Dietary orders reviewed?: NA Do you have support at home?: Yes People in Home: spouse  Medications Reviewed Today: Medications Reviewed Today     Reviewed by Earlie Server, RN (Registered Nurse) on 12/16/23 at 1302  Med List Status: <None>   Medication Order Taking? Sig Documenting Provider Last Dose Status Informant  albuterol (PROVENTIL) (2.5 MG/3ML) 0.083% nebulizer solution [id number] Yes Take 3 mLs (2.5 mg total) by nebulization every 6 (six) hours as needed for wheezing or shortness of breath. Anabel Halon, MD Taking Active Self  albuterol (VENTOLIN HFA) 108 (90 Base) MCG/ACT inhaler [id number] Yes Inhale 2 puffs into the lungs every 4 (four) hours as needed for wheezing. Anabel Halon, MD Taking Active Self  ALPRAZolam Prudy Feeler) 0.5 MG tablet [id number] Yes TAKE 1 TABLET BY MOUTH 2 TIMES DAILY AS NEEDED FOR ANXIETY. Anabel Halon, MD Taking Active Self  amLODipine (NORVASC) 5 MG tablet [id number] Yes Take 1 tablet (5 mg total) by mouth daily. Anabel Halon, MD Taking Active Self  apixaban (ELIQUIS) 5 MG TABS tablet [id number] Yes Take 1 tablet (5 mg total) by mouth 2 (two) times daily. Dara Lords, PA-C Taking Active   aspirin EC 81 MG tablet [id number]  Yes Take 81 mg by mouth every other day. Swallow whole. [provider] Taking Active Self           Med Note Rich Number Dec 09, 2023  2:29 PM) Not taking at this time  budesonide-formoterol [facility]) 160-4.5 MCG/ACT inhaler [id number] Yes Inhale 2 puffs into the lungs 2 (two) times daily.  Patient taking differently: Inhale 2 puffs into the lungs daily.   Anabel Halon, MD Taking Active Self  Cholecalciferol (VITAMIN D3) 125 MCG (5000 UT) CAPS [id number] Yes Take 5,000 Units by mouth daily. [provider] Taking Active Self  dronedarone (MULTAQ) 400 MG tablet [id number] Yes Take 1 tablet (400 mg total) by mouth 2 (two) times daily with a meal. Marinus Maw, MD Taking Active Self           Med Note Wilkie Aye, Imagene Riches Dec 09, 2023  2:32 PM) Has not started waiting on insurance  Evolocumab [facility]) 140 MG/ML Ivory Broad [id number] Yes Inject 140 mg into the skin every 14 (fourteen) days. Anabel Halon, MD Taking Active Self  ezetimibe-simvastatin (VYTORIN) 10-40 MG tablet [id number] Yes Take 1 tablet by mouth at bedtime. [provider] Taking Active Self  fenofibrate 160 MG tablet [id number] Yes TAKE 1 TABLET BY MOUTH EVERY DAY Anabel Halon, MD Taking Active Self           Med Note Lenoria Farrier   Wed Dec 09, 2023  2:26 PM) Bedtime  fish oil-omega-3 fatty acids 1000 MG capsule [id number] Yes Take 1 g by mouth at bedtime. [provider] Taking Active Self  Med Note Gunnar Fusi, MELISSA R   Tue Jan 15, 2021 10:34 AM)    losartan-hydrochlorothiazide [facility]) 100-12.5 MG tablet [id number] Yes Take 1 tablet by mouth daily. Anabel Halon, MD Taking Active Self  metoprolol tartrate (LOPRESSOR) 50 MG tablet [id number] Yes TAKE 1 TABLET BY MOUTH TWICE A DAY Anabel Halon, MD Taking Active Self  nitroGLYCERIN (NITROSTAT) 0.4 MG SL tablet [id number] No PLACE 1 TABLET (0.4 MG TOTAL) UNDER THE TONGUE EVERY 5 (FIVE) MINUTES AS NEEDED.   Patient not taking: Reported on 12/16/2023   Jonelle Sidle, MD Not Taking Active Self  oxyCODONE-acetaminophen (PERCOCET) 5-325 MG tablet [id number] No Take 1 tablet by mouth every 6 (six) hours as needed.  Patient not taking: Reported on 12/16/2023   Dara Lords, PA-C Not Taking Active   Respiratory Therapy Supplies (NEBULIZER/TUBING/MOUTHPIECE) KIT [id number] Yes Nebulizer tubing and mouthpiece Anabel Halon, MD Taking Active Self  zolpidem (AMBIEN) 10 MG tablet [id number] Yes TAKE 1 TABLET BY MOUTH AT BEDTIME AS NEEDED FOR SLEEP  Patient taking differently: Take 10 mg by mouth at bedtime. for sleep   Anabel Halon, MD Taking Active Self            Home Care and Equipment/Supplies: Were Home Health Services Ordered?: No Any new equipment or medical supplies ordered?: No  Functional Questionnaire: Do you need assistance with bathing/showering or dressing?: No Do you need assistance with meal preparation?: No Do you need assistance with eating?: No Do you have difficulty maintaining continence: No Do you need assistance with getting out of bed/getting out of a chair/moving?: No Do you have difficulty managing or taking your medications?: No  Follow up appointments reviewed: PCP Follow-up appointment confirmed?: No (was not advised) MD Provider Line Number:[phone] Given: No Specialist Hospital Follow-up appointment confirmed?: Yes Date of Specialist follow-up appointment?: 02/24/24 Do you need transportation to your follow-up appointment?: No Do you understand care options if your condition(s) worsen?: Yes-patient verbalized understanding  SDOH Interventions Today    Flowsheet Row Most Recent Value  SDOH Interventions   Food Insecurity Interventions Intervention Not Indicated  Housing Interventions Intervention Not Indicated  Transportation Interventions Intervention Not Indicated  Utilities Interventions Intervention Not Indicated      Interventions  Today    Flowsheet Row Most Recent Value  Chronic Disease   Chronic disease during today's visit Other  Nutrition Interventions   Nutrition Discussed/Reviewed Nutrition Discussed  [reviewed prevention of constipation]  Pharmacy Interventions   Pharmacy Dicussed/Reviewed Medications and their functions       TOC Interventions Today    Flowsheet Row Most Recent Value  TOC Interventions   TOC Interventions Discussed/Reviewed TOC Interventions Discussed, TOC Interventions Reviewed, Post discharge activity limitations per provider, Post op wound/incision care, S/S of infection       Patient doing very well. Reviewed all discharge instructions and medications, Encouraged patient to call MD for any future concerns. Offered 30 day TOC program and patient declined.  Lonia Chimera, RN, BSN, CEN [employer]- Transition of Care Team.  [facility] [phone]

## 2023-12-16 NOTE — Telephone Encounter (Signed)
Patient and his wife called in to the triage line with c/o of swelling in his scrotum and penis.   Reviewed pt's chart, returned call for clarification, two identifiers used.  I spoke with the wife and the patient in the background,  she stated that it is mostly on the one side of his penis, his scrotum had a little swelling. He is voiding without issue, no visible blood. She stated that the he had been up "doing more than he should have been today"  I reviewed with them the importance of elevation and the proper elevation techniques. I told them that some swelling in this area is common with the procedure that he had done and that as long as he is voiding and there is no pain or other issues, that it should subside.  The wife stated that she had him lie down while they were waiting for [place] to call back and that it already had started to improve. I told her to call us back if there are any further issues.  I notified Dr. Chestine Spore of above.

## 2023-12-16 NOTE — Transitions of Care (Post Inpatient/ED Visit) (Signed)
   12/16/2023  Name: Dennis [name] MRN: [id number] DOB: [birth year]  Today's TOC FU Call Status: Today's TOC FU Call Status:: Unsuccessful Call (1st Attempt) Unsuccessful Call (1st Attempt) Date: 12/16/23  Attempted to reach the patient regarding the most recent Inpatient/ED visit.  Follow Up Plan: Additional outreach attempts will be made to reach the patient to complete the Transitions of Care (Post Inpatient/ED visit) call.   Lonia Chimera, RN, BSN, CEN [employer]- Transition of Care Team.  [facility] [phone]

## 2023-12-17 ENCOUNTER — Telehealth: Payer: Self-pay

## 2023-12-17 NOTE — Patient Outreach (Signed)
  Care Coordination   Care coordination  Visit Note   12/17/2023 Name: Dennis [name] MRN: [id number] DOB: [birth year]  Dennis [name] is a 73 y.o. year old male who sees Anabel Halon, MD for primary care. I spoke with  Dennis [name] by phone today.  What matters to the patients health and wellness today?  Received a voicemail from patient. I returned call and patient reported that he noted penile swelling on his surgical side yesterday.   Reports that he call Dr. Burna Forts office and spoke with nurse and was given advice.  Patient reports today that he has itching and a rash. Reports that he spoke with Dr. Chestine Spore himself today and was given direction to take benadryl.  Patient stated he was concerns about blood in his stool and he is calling cardiology today.  Patient denies any additional concerns. States that Dr. Chestine Spore answered all his questions. Patient did deny any urinary S/S.        Care Coordination Interventions:  Yes, provided    Interventions Today    Flowsheet Row Most Recent Value  Chronic Disease   Chronic disease during today's visit Other  [penile swelling, itching]  Education Interventions   Education Provided Provided Education  [encouraged patient to follow directions from MD. Encoruaged patient to call me back if needed]       Follow up plan: No further intervention required.   Encounter Outcome:  Patient Visit Completed   Lonia Chimera, RN, BSN, CEN Population Health- Transition of Care Team.  [facility] [phone]

## 2023-12-18 ENCOUNTER — Telehealth: Payer: Self-pay

## 2023-12-18 ENCOUNTER — Other Ambulatory Visit: Payer: Self-pay | Admitting: Physician Assistant

## 2023-12-18 DIAGNOSIS — I739 Peripheral vascular disease, unspecified: Secondary | ICD-10-CM

## 2023-12-18 MED ORDER — TRAMADOL HCL 50 MG PO TABS: 50.0000 mg | ORAL_TABLET | Freq: Four times a day (QID) | ORAL | 0 refills | Status: DC | PRN

## 2023-12-18 NOTE — Telephone Encounter (Signed)
Triage call:  Pt called stating he took 1 or 2 of the pills of the pain medicine we prescribed and it made him itchy and thought that he could do with out them but since realized he needs a little something. MD messaged

## 2023-12-18 NOTE — Telephone Encounter (Signed)
Patient advised he may use Benadryl for itching and alternate pain medicine sent to pharmacy

## 2023-12-21 ENCOUNTER — Other Ambulatory Visit: Payer: Self-pay | Admitting: Internal Medicine

## 2023-12-21 DIAGNOSIS — E785 Hyperlipidemia, unspecified: Secondary | ICD-10-CM

## 2024-01-01 ENCOUNTER — Telehealth: Payer: Self-pay | Admitting: Internal Medicine

## 2024-01-01 NOTE — Telephone Encounter (Signed)
 Called and spoke with pt concerning blood in his stool and only taking Eliquis  once daily since January. Pt states when he first started Eliquis  he experienced blood in his stool daily. Since he started taking Eliquis  once daily, he still has some blood when he has a bowel movement; however, it is not daily and blood is only seen on tissue. I educated pt on the purpose of Eliquis  and when it is not taken as prescribed (twice daily), he is as risk for blood clot/stroke due to his Afib. Since he has been experiencing bleeding, I made him aware I will forward to Dr Waddell for further instructions.

## 2024-01-01 NOTE — Telephone Encounter (Signed)
 Pt c/o medication issue:  1. Name of Medication:  apixaban  (ELIQUIS ) 5 MG TABS tablet dronedarone  (MULTAQ ) 400 MG tablet  2. How are you currently taking this medication (dosage and times per day)?   3. Are you having a reaction (difficulty breathing--STAT)?   4. What is your medication issue?   Patient states he has been having blood in his stool around mid-January after going back on blood thinner after a procedure. He states he switched both Eliquis  and Multaq  to once daily since then. He mentions that blood in his stool isn't uncommon for him. He mentions he's had colitis since around teenage years and into 20's. Reports his BP/O2 are good. Would like to discuss recommendations with Dr. Waddell soon if possible.

## 2024-01-01 NOTE — Telephone Encounter (Signed)
 Please refer to Dr. Daneil Dunker to discuss Watchman

## 2024-01-04 ENCOUNTER — Other Ambulatory Visit: Payer: Self-pay | Admitting: Internal Medicine

## 2024-01-04 DIAGNOSIS — I10 Essential (primary) hypertension: Secondary | ICD-10-CM

## 2024-01-04 DIAGNOSIS — I739 Peripheral vascular disease, unspecified: Secondary | ICD-10-CM

## 2024-01-04 DIAGNOSIS — J42 Unspecified chronic bronchitis: Secondary | ICD-10-CM

## 2024-01-04 DIAGNOSIS — I25118 Atherosclerotic heart disease of native coronary artery with other forms of angina pectoris: Secondary | ICD-10-CM

## 2024-01-04 NOTE — Telephone Encounter (Signed)
 Left message to call back to discuss Watchman consult.

## 2024-01-05 ENCOUNTER — Telehealth: Payer: Self-pay

## 2024-01-05 NOTE — Telephone Encounter (Signed)
Triage/Advice: -pt called stating he is having swelling in is R ankle and wanted to know if that was normal -on chart review, pt is post-surgical for multiple RLE interventions -returned call to pt who states his incision is still a little sore but 90% better, that he had a lot of swelling initially including leg, penis, scrotum, hips and even back but that is all better but now notices painless swelling in his ankle-that is leg is a lot better and knows he has the blood flow back and it is warm.  He says he notices it at the end of the day, and when he lays down at night, the swelling is gone in the morning. -reviewed discharge instructions with the pt which indicated swelling may continue for a few months, that he can try compression hose to help reduce the swelling that accumulate throughout the day.  Pt confirms understanding

## 2024-01-05 NOTE — Telephone Encounter (Signed)
Spoke with the patient at length.  He described a long history of GI bleeding even with ASA 81 mg daily. He has historically only tolerated ASA 81 mg every other day to prevent bleeding.  He reported it took 3 days for Eliquis to cause bleeding in his stool. He decreased the medication to every other day and the GI bleeding seems to be controlled. While he understood that Eliquis is not therapeutic if taken only 1 time daily, he does not want to take more. Discussed Watchman and offered appointment. At this time, he does not wish to schedule consult. He requested to see Dr. Ladona Ridgel sooner. Scheduled the patient with Dr. Ladona Ridgel 2/17 to discuss a/c and potential Watchman referral.  The patient was grateful for assistance.

## 2024-01-09 ENCOUNTER — Other Ambulatory Visit: Payer: Self-pay | Admitting: Internal Medicine

## 2024-01-09 DIAGNOSIS — I739 Peripheral vascular disease, unspecified: Secondary | ICD-10-CM

## 2024-01-09 DIAGNOSIS — I25118 Atherosclerotic heart disease of native coronary artery with other forms of angina pectoris: Secondary | ICD-10-CM

## 2024-01-11 ENCOUNTER — Ambulatory Visit: Payer: Medicare HMO | Attending: Internal Medicine | Admitting: Internal Medicine

## 2024-01-11 ENCOUNTER — Encounter: Payer: Self-pay | Admitting: Internal Medicine

## 2024-01-11 VITALS — BP 130/70 | HR 71 | Ht 73.0 in | Wt 212.4 lb

## 2024-01-11 DIAGNOSIS — I480 Paroxysmal atrial fibrillation: Secondary | ICD-10-CM | POA: Diagnosis not present

## 2024-01-11 NOTE — Patient Instructions (Signed)
Medication Instructions:   Your physician recommends that you continue on your current medications as directed. Please refer to the Current Medication list given to you today.   Labwork: None today  Testing/Procedures: None today  Follow-Up: With Dr.Parker going forward  Any Other Special Instructions Will Be Listed Below (If Applicable).    You have been referred to Dr.Parker,electrophysiologist at the [facility] office to discuss the Watchman Procedure.   If you need a refill on your cardiac medications before your next appointment, please call your pharmacy.

## 2024-01-11 NOTE — Progress Notes (Signed)
HPI Dennis [name] returns for followup. He is a pleasant 73 yo man with CAD, s/p CABG with preserved LV function. He developed atrial flutter and underwent catheter ablation 6 years ago. He developed recurrent palpitations and a heart monitor shows PAF. He could have some atypical flutter as well. He has a RVR and a CVR. He has been on a beta blocker. His monitor showed 6% afib. Since I saw him last, he had been placed on eliquis and developed bleeding and CHF symptoms on Multaq. He has also had lower extremity revascularization by Dr. Chestine Spore.  Allergies  Allergen Reactions   Ace Inhibitors Shortness Of Breath and Cough   Enalapril Cough    Pt has been switched to Valsartan and is tolerating the different class.   Other     Vein in Right Leg and Right Arm is missing due to a open heart surgery in 2005   Dexamethasone Itching    Pt was prescribed this in december   Alirocumab Rash and Other (See Comments)    Blisters *Praluent   Clotrimazole-Betamethasone Rash   Prednisone Rash     Current Outpatient Medications  Medication Sig Dispense Refill   albuterol (PROVENTIL) (2.5 MG/3ML) 0.083% nebulizer solution Take 3 mLs (2.5 mg total) by nebulization every 6 (six) hours as needed for wheezing or shortness of breath. 360 mL 3   albuterol (VENTOLIN HFA) 108 (90 Base) MCG/ACT inhaler INHALE 2 PUFFS INTO THE LUNGS EVERY 4 HOURS AS NEEDED FOR WHEEZE 18 each 5   ALPRAZolam (XANAX) 0.5 MG tablet TAKE 1 TABLET BY MOUTH 2 TIMES DAILY AS NEEDED FOR ANXIETY. 30 tablet 3   amLODipine (NORVASC) 5 MG tablet Take 1 tablet (5 mg total) by mouth daily. 90 tablet 1   apixaban (ELIQUIS) 5 MG TABS tablet Take 1 tablet (5 mg total) by mouth 2 (two) times daily.     aspirin EC 81 MG tablet Take 81 mg by mouth every other day. Swallow whole.     budesonide-formoterol (SYMBICORT) 160-4.5 MCG/ACT inhaler TAKE 2 PUFFS FIRST THING IN MORNING AND THEN ANOTHER 2 PUFFS ABOUT 12 HOURS LATER. 30.6 each 1    Cholecalciferol (VITAMIN D3) 125 MCG (5000 UT) CAPS Take 5,000 Units by mouth daily.     dronedarone (MULTAQ) 400 MG tablet Take 1 tablet (400 mg total) by mouth 2 (two) times daily with a meal. 60 tablet 11   Evolocumab (REPATHA SURECLICK) 140 MG/ML SOAJ INJECT 140 MG INTO THE SKIN EVERY 14 (FOURTEEN) DAYS. 1 mL 1   ezetimibe-simvastatin (VYTORIN) 10-40 MG tablet TAKE 1 TABLET BY MOUTH EVERY DAY 90 tablet 1   fenofibrate 160 MG tablet TAKE 1 TABLET BY MOUTH EVERY DAY 90 tablet 1   fish oil-omega-3 fatty acids 1000 MG capsule Take 1 g by mouth at bedtime.     losartan-hydrochlorothiazide (HYZAAR) 100-12.5 MG tablet Take 1 tablet by mouth daily. 90 tablet 1   metoprolol tartrate (LOPRESSOR) 50 MG tablet TAKE 1 TABLET BY MOUTH TWICE A DAY 180 tablet 1   nitroGLYCERIN (NITROSTAT) 0.4 MG SL tablet PLACE 1 TABLET (0.4 MG TOTAL) UNDER THE TONGUE EVERY 5 (FIVE) MINUTES AS NEEDED. 25 tablet 3   Respiratory Therapy Supplies (NEBULIZER/TUBING/MOUTHPIECE) KIT Nebulizer tubing and mouthpiece 1 kit 0   traMADol (ULTRAM) 50 MG tablet Take 1 tablet (50 mg total) by mouth every 6 (six) hours as needed. 20 tablet 0   zolpidem (AMBIEN) 10 MG tablet TAKE 1 TABLET BY MOUTH AT BEDTIME  AS NEEDED FOR SLEEP (Patient taking differently: Take 10 mg by mouth at bedtime. for sleep) 30 tablet 3   No current facility-administered medications for this visit.     Past Medical History:  Diagnosis Date   A-fib [facility])    tx by Dr [name]   Atrial flutter [facility])    Diagnosed by ECG August 2015   Bilateral hearing loss 12/02/2018   no hearing aids   CKD (chronic kidney disease)    Colitis    COPD (chronic obstructive pulmonary disease) (HCC)    Coronary atherosclerosis of native coronary artery    Multivessel status post CABG   Essential hypertension    Hyperlipidemia    Insomnia    Peripheral vascular disease (HCC) 11/11/2023   PTSD (post-traumatic stress disorder)     ROS:   All systems reviewed and negative  except as noted in the HPI.   Past Surgical History:  Procedure Laterality Date   ABDOMINAL AORTOGRAM W/LOWER EXTREMITY Bilateral 12/03/2023   Procedure: ABDOMINAL AORTOGRAM W/LOWER EXTREMITY;  Surgeon: Cephus Shelling, MD;  [facility];  Service: Cardiovascular;  Laterality: Bilateral;   COLONOSCOPY  01/29/2005   Dr. Jena Gauss; rectal polyp s/p polypectomy, otherwise normal.  Pathology with tubulovillous adenoma.   COLONOSCOPY  02/06/2008   Dr. Jena Gauss; minimal internal hemorrhoids, diminutive polyp in the splenic flexure s/p cold biopsy removal, otherwise normal.  Pathology with benign polypoid colonic mucosa.   COLONOSCOPY WITH PROPOFOL N/A 08/30/2015   Procedure: COLONOSCOPY WITH PROPOFOL at cecum at 0814; withdrawal time=8 minutes;  Surgeon: Corbin Ade, MD; internal hemorrhoids-likely source of hematochezia, otherwise normal exam.  Repeat in 5 years.   COLONOSCOPY WITH PROPOFOL N/A 01/28/2021   Procedure: COLONOSCOPY WITH PROPOFOL;  Surgeon: Corbin Ade, MD;  [facility];  Service: Endoscopy;  Laterality: N/A;  am appt   CORONARY ARTERY BYPASS GRAFT  2005   Dr. Zenaida Niece Trigt: LIMA to LAD, right radial to circumflex, SVG to RCA   CORONARY ARTERY BYPASS GRAFT  10/16/2004   ELECTROPHYSIOLOGIC STUDY N/A 11/21/2016   Procedure: A-Flutter Ablation;  Surgeon: Marinus Maw, MD;  [facility];  Service: Cardiovascular;  Laterality: N/A;   ENDARTERECTOMY FEMORAL Right 12/14/2023   Procedure: RIGHT ENDARTERECTOMY  COMMON FEMORAL PROFUNDOPLASTY WITH XENSURE BOVINE PATCH;  Surgeon: Cephus Shelling, MD;  Location: [facility] OR;  Service: Vascular;  Laterality: Right;   TEE WITHOUT CARDIOVERSION N/A 11/21/2016   Procedure: TRANSESOPHAGEAL ECHOCARDIOGRAM (TEE);  Surgeon: Thurmon Fair, MD;  Location: [facility] ENDOSCOPY;  Service: Cardiovascular;  Laterality: N/A;     Family History  Problem Relation Age of Onset   Hypertension Mother    Alcohol abuse Mother     Alcohol abuse Father    Colon cancer Neg Hx      Social History   Socioeconomic History   Marital status: Married    Spouse name: Not on file   Number of children: 3   Years of education: Not on file   Highest education level: Not on file  Occupational History   Not on file  Tobacco Use   Smoking status: Former    Current packs/day: 0.00    Average packs/day: 2.0 packs/day for 27.0 years (54.0 ttl pk-yrs)    Types: Cigarettes    Start date: 02/01/1971    Quit date: 01/31/1998    Years since quitting: 25.9   Smokeless tobacco: Current    Types: Chew  Vaping Use   Vaping status:  Never Used  Substance and Sexual Activity   Alcohol use: Yes    Alcohol/week: 14.0 - 28.0 standard drinks of alcohol    Types: 14 - 28 Cans of beer per week    Comment: Drinks beer daily, typically 2-4 a day.   Drug use: No   Sexual activity: Yes  Other Topics Concern   Not on file  Social History Narrative   Lives with wife, Victorino Dike   Social Drivers of Health   Financial Resource Strain: Medium Risk (08/05/2021)   Overall Financial Resource Strain (CARDIA)    Difficulty of Paying Living Expenses: Somewhat hard  Food Insecurity: No Food Insecurity (12/16/2023)   Hunger Vital Sign    Worried About Running Out of Food in the Last Year: Never true    Ran Out of Food in the Last Year: Never true  Transportation Needs: No Transportation Needs (12/16/2023)   PRAPARE - Administrator, Civil Service (Medical): No    Lack of Transportation (Non-Medical): No  Physical Activity: Insufficiently Active (08/05/2021)   Exercise Vital Sign    Days of Exercise per Week: 3 days    Minutes of Exercise per Session: 30 min  Stress: No Stress Concern Present (08/05/2021)   [employer] of Occupational Health - Occupational Stress Questionnaire    Feeling of Stress : Only a little  Social Connections: Unknown (12/14/2023)   Social Connection and Isolation Panel [NHANES]    Frequency of  Communication with Friends and Family: More than three times a week    Frequency of Social Gatherings with Friends and Family: More than three times a week    Attends Religious Services: Not on file    Active Member of Clubs or Organizations: Not on file    Attends Banker Meetings: Not on file    Marital Status: Not on file  Intimate Partner Violence: Not At Risk (12/16/2023)   Humiliation, Afraid, Rape, and Kick questionnaire    Fear of Current or Ex-Partner: No    Emotionally Abused: No    Physically Abused: No    Sexually Abused: No     BP 130/70   Pulse 71   Ht 6\' 1"  (1.854 m)   Wt 212 lb 6.4 oz (96.3 kg)   SpO2 90%   BMI 28.02 kg/m   Physical Exam:  Well appearing NAD HEENT: Unremarkable Neck:  No JVD, no thyromegally Lymphatics:  No adenopathy Back:  No CVA tenderness Lungs:  Clear with decreased breath sounds.  HEART:  Regular rate rhythm, no murmurs, no rubs, no clicks Abd:  soft, positive bowel sounds, no organomegally, no rebound, no guarding Ext:  2 plus pulses, no edema, no cyanosis, no clubbing Skin:  No rashes no nodules Neuro:  CN II through XII intact, motor grossly intact  Assess/Plan: PAF - he is quite symptomatic and has failed multaq. I will refer to discuss PVI. GI bleeding - he has had some bleeding on eliquis. I will refer to conisdr Watchman. CAD - he has some angina when he goes fast in afib.  COPD - his oxygen sat is 90% on RA.   Sharlot Gowda Contrell Ballentine,MD

## 2024-01-25 NOTE — Progress Notes (Unsigned)
 Patient name: Dennis [name] MRN: [id number] DOB: 1951-08-29 Sex: male  REASON FOR VISIT: Postop check after right common femoral endarterectomy  HPI: [name] is a 73 y.o. male with history of atrial flutter, COPD, hypertension, hyperlipidemia that presents for postop check after recent right common femoral endarterectomy for disabling claudication.  On 12/14/2023 he underwent right common femoral endarterectomy with profundoplasty and endarterectomy of the proximal SFA with bovine patch.  Groin healed without issue.  States his right leg is doing markedly better.  Now able to walk anywhere from 400 yards to half a mile without stopping.  Is seeing cardiology for his A-fib.  Plans to delay intervention on his left leg for now as it doesn't bother him to the degree of right leg.  Past Medical History:  Diagnosis Date   A-fib [facility])    tx by Dr [name]   Atrial flutter [facility])    Diagnosed by ECG August 2015   Bilateral hearing loss 12/02/2018   no hearing aids   CKD (chronic kidney disease)    Colitis    COPD (chronic obstructive pulmonary disease) (HCC)    Coronary atherosclerosis of native coronary artery    Multivessel status post CABG   Essential hypertension    Hyperlipidemia    Insomnia    Peripheral vascular disease (HCC) 11/11/2023   PTSD (post-traumatic stress disorder)     Past Surgical History:  Procedure Laterality Date   ABDOMINAL AORTOGRAM W/LOWER EXTREMITY Bilateral 12/03/2023   Procedure: ABDOMINAL AORTOGRAM W/LOWER EXTREMITY;  Surgeon: Cephus Shelling, MD;  [facility];  Service: Cardiovascular;  Laterality: Bilateral;   COLONOSCOPY  01/29/2005   Dr. Jena Gauss; rectal polyp s/p polypectomy, otherwise normal.  Pathology with tubulovillous adenoma.   COLONOSCOPY  02/06/2008   Dr. Jena Gauss; minimal internal hemorrhoids, diminutive polyp in the splenic flexure s/p cold biopsy removal, otherwise normal.  Pathology with benign polypoid colonic  mucosa.   COLONOSCOPY WITH PROPOFOL N/A 08/30/2015   Procedure: COLONOSCOPY WITH PROPOFOL at cecum at 0814; withdrawal time=8 minutes;  Surgeon: Corbin Ade, MD; internal hemorrhoids-likely source of hematochezia, otherwise normal exam.  Repeat in 5 years.   COLONOSCOPY WITH PROPOFOL N/A 01/28/2021   Procedure: COLONOSCOPY WITH PROPOFOL;  Surgeon: Corbin Ade, MD;  [facility];  Service: Endoscopy;  Laterality: N/A;  am appt   CORONARY ARTERY BYPASS GRAFT  2005   Dr. Zenaida Niece Trigt: LIMA to LAD, right radial to circumflex, SVG to RCA   CORONARY ARTERY BYPASS GRAFT  10/16/2004   ELECTROPHYSIOLOGIC STUDY N/A 11/21/2016   Procedure: A-Flutter Ablation;  Surgeon: Marinus Maw, MD;  [facility];  Service: Cardiovascular;  Laterality: N/A;   ENDARTERECTOMY FEMORAL Right 12/14/2023   Procedure: RIGHT ENDARTERECTOMY  COMMON FEMORAL PROFUNDOPLASTY WITH XENSURE BOVINE PATCH;  Surgeon: Cephus Shelling, MD;  Location: [facility] OR;  Service: Vascular;  Laterality: Right;   TEE WITHOUT CARDIOVERSION N/A 11/21/2016   Procedure: TRANSESOPHAGEAL ECHOCARDIOGRAM (TEE);  Surgeon: Thurmon Fair, MD;  Location: [facility] ENDOSCOPY;  Service: Cardiovascular;  Laterality: N/A;    Family History  Problem Relation Age of Onset   Hypertension Mother    Alcohol abuse Mother    Alcohol abuse Father    Colon cancer Neg Hx     SOCIAL HISTORY: Social History   Tobacco Use   Smoking status: Former    Current packs/day: 0.00    Average packs/day: 2.0 packs/day for 27.0 years (54.0 ttl pk-yrs)  Types: Cigarettes    Start date: 02/01/1971    Quit date: 01/31/1998    Years since quitting: 26.0   Smokeless tobacco: Current    Types: Chew  Substance Use Topics   Alcohol use: Yes    Alcohol/week: 14.0 - 28.0 standard drinks of alcohol    Types: 14 - 28 Cans of beer per week    Comment: Drinks beer daily, typically 2-4 a day.    Allergies  Allergen Reactions   Ace Inhibitors Shortness Of  Breath and Cough   Enalapril Cough    Pt has been switched to Valsartan and is tolerating the different class.   Other     Vein in Right Leg and Right Arm is missing due to a open heart surgery in 2005   Dexamethasone Itching    Pt was prescribed this in december   Alirocumab Rash and Other (See Comments)    Blisters *Praluent   Clotrimazole-Betamethasone Rash   Prednisone Rash    Current Outpatient Medications  Medication Sig Dispense Refill   albuterol (PROVENTIL) (2.5 MG/3ML) 0.083% nebulizer solution Take 3 mLs (2.5 mg total) by nebulization every 6 (six) hours as needed for wheezing or shortness of breath. 360 mL 3   albuterol (VENTOLIN HFA) 108 (90 Base) MCG/ACT inhaler INHALE 2 PUFFS INTO THE LUNGS EVERY 4 HOURS AS NEEDED FOR WHEEZE 18 each 5   ALPRAZolam (XANAX) 0.5 MG tablet TAKE 1 TABLET BY MOUTH 2 TIMES DAILY AS NEEDED FOR ANXIETY. 30 tablet 3   amLODipine (NORVASC) 5 MG tablet Take 1 tablet (5 mg total) by mouth daily. 90 tablet 1   apixaban (ELIQUIS) 5 MG TABS tablet Take 1 tablet (5 mg total) by mouth 2 (two) times daily.     aspirin EC 81 MG tablet Take 81 mg by mouth every other day. Swallow whole.     budesonide-formoterol (SYMBICORT) 160-4.5 MCG/ACT inhaler TAKE 2 PUFFS FIRST THING IN MORNING AND THEN ANOTHER 2 PUFFS ABOUT 12 HOURS LATER. 30.6 each 1   Cholecalciferol (VITAMIN D3) 125 MCG (5000 UT) CAPS Take 5,000 Units by mouth daily.     dronedarone (MULTAQ) 400 MG tablet Take 1 tablet (400 mg total) by mouth 2 (two) times daily with a meal. 60 tablet 11   Evolocumab (REPATHA SURECLICK) 140 MG/ML SOAJ INJECT 140 MG INTO THE SKIN EVERY 14 (FOURTEEN) DAYS. 1 mL 1   ezetimibe-simvastatin (VYTORIN) 10-40 MG tablet TAKE 1 TABLET BY MOUTH EVERY DAY 90 tablet 1   fenofibrate 160 MG tablet TAKE 1 TABLET BY MOUTH EVERY DAY 90 tablet 1   fish oil-omega-3 fatty acids 1000 MG capsule Take 1 g by mouth at bedtime.     losartan-hydrochlorothiazide (HYZAAR) 100-12.5 MG tablet Take 1  tablet by mouth daily. 90 tablet 1   metoprolol tartrate (LOPRESSOR) 50 MG tablet TAKE 1 TABLET BY MOUTH TWICE A DAY 180 tablet 1   nitroGLYCERIN (NITROSTAT) 0.4 MG SL tablet PLACE 1 TABLET (0.4 MG TOTAL) UNDER THE TONGUE EVERY 5 (FIVE) MINUTES AS NEEDED. 25 tablet 3   Respiratory Therapy Supplies (NEBULIZER/TUBING/MOUTHPIECE) KIT Nebulizer tubing and mouthpiece 1 kit 0   traMADol (ULTRAM) 50 MG tablet Take 1 tablet (50 mg total) by mouth every 6 (six) hours as needed. 20 tablet 0   zolpidem (AMBIEN) 10 MG tablet TAKE 1 TABLET BY MOUTH AT BEDTIME AS NEEDED FOR SLEEP (Patient taking differently: Take 10 mg by mouth at bedtime. for sleep) 30 tablet 3   No current facility-administered medications for  this visit.    REVIEW OF SYSTEMS:  [X]  denotes positive finding, [ ]  denotes negative finding Cardiac  Comments:  Chest pain or chest pressure:    Shortness of breath upon exertion:    Short of breath when lying flat:    Irregular heart rhythm:        Vascular    Pain in calf, thigh, or hip brought on by ambulation:    Pain in feet at night that wakes you up from your sleep:     Blood clot in your veins:    Leg swelling:         Pulmonary    Oxygen at home:    Productive cough:     Wheezing:         Neurologic    Sudden weakness in arms or legs:     Sudden numbness in arms or legs:     Sudden onset of difficulty speaking or slurred speech:    Temporary loss of vision in one eye:     Problems with dizziness:         Gastrointestinal    Blood in stool:     Vomited blood:         Genitourinary    Burning when urinating:     Blood in urine:        Psychiatric    Major depression:         Hematologic    Bleeding problems:    Problems with blood clotting too easily:        Skin    Rashes or ulcers:        Constitutional    Fever or chills:      PHYSICAL EXAM: There were no vitals filed for this visit.  GENERAL: The patient is a well-nourished male, in no acute  distress. The vital signs are documented above. CARDIAC: There is a regular rate and rhythm.  VASCULAR:  Bilateral femoral pulses palpable Right groin incision well-healed No palpable pedal pulses No tissue loss   DATA:   N/A  Assessment/Plan:   73 y.o. male with history of atrial flutter, COPD, hypertension, hyperlipidemia that presents for postop check after recent right common femoral endarterectomy for disabling claudication.  On 12/14/2023 he underwent right common femoral endarterectomy with profundoplasty and endarterectomy of the proximal SFA with bovine patch.  Right groin has healed very nicely.  He has an excellent palpable right femoral pulse.  Now walking anywhere from 400 yards to half a mile without stopping.  Less disabled by his left leg given the significant improvement with his right leg.  We agreed on delaying any intervention on the left leg for now.  Continue walking therapies.  I will see him in 3 months with repeat ABIs.  Cephus Shelling, MD [facility] Office: [phone]  Cephus Shelling

## 2024-01-26 ENCOUNTER — Encounter: Payer: Self-pay | Admitting: Vascular Surgery

## 2024-01-26 ENCOUNTER — Ambulatory Visit (INDEPENDENT_AMBULATORY_CARE_PROVIDER_SITE_OTHER): Payer: Medicare HMO | Admitting: Vascular Surgery

## 2024-01-26 VITALS — BP 145/82 | HR 63 | Resp 20 | Ht 73.0 in | Wt 215.0 lb

## 2024-01-26 DIAGNOSIS — I739 Peripheral vascular disease, unspecified: Secondary | ICD-10-CM

## 2024-01-26 DIAGNOSIS — I70219 Atherosclerosis of native arteries of extremities with intermittent claudication, unspecified extremity: Secondary | ICD-10-CM

## 2024-02-04 ENCOUNTER — Other Ambulatory Visit: Payer: Self-pay | Admitting: *Deleted

## 2024-02-04 DIAGNOSIS — I70219 Atherosclerosis of native arteries of extremities with intermittent claudication, unspecified extremity: Secondary | ICD-10-CM

## 2024-02-04 DIAGNOSIS — I739 Peripheral vascular disease, unspecified: Secondary | ICD-10-CM

## 2024-02-12 ENCOUNTER — Encounter: Payer: Self-pay | Admitting: Internal Medicine

## 2024-02-12 ENCOUNTER — Ambulatory Visit (INDEPENDENT_AMBULATORY_CARE_PROVIDER_SITE_OTHER): Admitting: Internal Medicine

## 2024-02-12 VITALS — BP 130/67 | HR 93 | Ht 73.0 in | Wt 214.2 lb

## 2024-02-12 DIAGNOSIS — G4700 Insomnia, unspecified: Secondary | ICD-10-CM | POA: Diagnosis not present

## 2024-02-12 DIAGNOSIS — I480 Paroxysmal atrial fibrillation: Secondary | ICD-10-CM

## 2024-02-12 DIAGNOSIS — J9611 Chronic respiratory failure with hypoxia: Secondary | ICD-10-CM

## 2024-02-12 DIAGNOSIS — J42 Unspecified chronic bronchitis: Secondary | ICD-10-CM

## 2024-02-12 DIAGNOSIS — I739 Peripheral vascular disease, unspecified: Secondary | ICD-10-CM | POA: Diagnosis not present

## 2024-02-12 DIAGNOSIS — E782 Mixed hyperlipidemia: Secondary | ICD-10-CM | POA: Diagnosis not present

## 2024-02-12 DIAGNOSIS — N1832 Chronic kidney disease, stage 3b: Secondary | ICD-10-CM | POA: Diagnosis not present

## 2024-02-12 DIAGNOSIS — F419 Anxiety disorder, unspecified: Secondary | ICD-10-CM | POA: Diagnosis not present

## 2024-02-12 MED ORDER — ALPRAZOLAM 0.5 MG PO TABS: 0.5000 mg | ORAL_TABLET | Freq: Two times a day (BID) | ORAL | 3 refills | Status: DC | PRN

## 2024-02-12 MED ORDER — ZOLPIDEM TARTRATE 10 MG PO TABS: 10.0000 mg | ORAL_TABLET | Freq: Every evening | ORAL | 3 refills | Status: DC | PRN

## 2024-02-12 NOTE — Assessment & Plan Note (Signed)
Takes Xanax PRN, for panic episodes Associated with previous incident with his wife (cardiac arrest) Symptoms better now Takes Ambien 10 mg QD for insomnia

## 2024-02-12 NOTE — Assessment & Plan Note (Addendum)
 Recent Zio patch monitor showed paroxysmal A Fib On metoprolol for rate control, but he has discontinued it On aspirin 81 mg QD and Eliquis 5 mg QD (had rectal bleeding with BID dosing) Did not tolerate anticoagulation in the past, had GI bleeding Planned to see EP cardiology-Dr. Jimmey Ralph

## 2024-02-12 NOTE — Assessment & Plan Note (Signed)
 CMP reviewed GFR usually around 40 Will monitor for now On ARB Decreased dose of diuretic Advised for proper hydration Avoid nephrotoxic agents

## 2024-02-12 NOTE — Assessment & Plan Note (Signed)
Takes Ambien as needed 

## 2024-02-12 NOTE — Assessment & Plan Note (Signed)
Quit smoking in 1999 On Symbicort and Albuterol Advised to comply with Symbicort to avoid frequent exacerbations Undergoing pulmonary rehab program - paperwork signed for [facility] health forever fit program

## 2024-02-12 NOTE — Patient Instructions (Signed)
 Please start taking Metoprolol half tablet twice daily.  Please continue to take other medications as prescribed.  Please continue to follow low salt diet and ambulate as tolerated.

## 2024-02-12 NOTE — Assessment & Plan Note (Signed)
 Used to follow up with lipid clinic Was on Praluent, had severe fatigue and rash with it -could not tolerate it Checked lipid profile - LDL at goal now, on Repatha and Vytorin now

## 2024-02-12 NOTE — Assessment & Plan Note (Addendum)
 Followed by Vascular surgery - On 12/14/2023 he underwent right common femoral endarterectomy with profundoplasty and endarterectomy of the proximal SFA with bovine patch.  Had leg claudication symptoms, improved now Was on aspirin in the past, but had recurrent GI bleeding with it - continue QOD for now On Repatha

## 2024-02-12 NOTE — Assessment & Plan Note (Signed)
 Likely from A-fib and COPD Would benefit from home oxygen for as needed use Prescribed home O2 through [employer]

## 2024-02-12 NOTE — Progress Notes (Signed)
 Established Patient Office Visit  Subjective:  Patient ID: Dennis [name], male    DOB: 03/13/1951  Age: 73 y.o. MRN: [id number]  CC:  Chief Complaint  Patient presents with   Care Management    4 month f/u, reports he d/c metoprolol, has had low oxygen readings at home.     HPI Dennis [name] is a 73 y.o. male with past medical history of CAD s/p CABG, HTN, HLD, COPD, anxiety and insomnia who presents for f/u of his chronic medical conditions.  HTN: BP is well-controlled. He takes losartan/hydrochlorothiazide 100-12.5 mg QD and Imdur 30 mg QD currently.  Patient denies headache, dizziness or palpitations.  Atrial fibrillation: He has intermittent dyspnea and palpitations. He has had cardiac ablation done in the past.  He stopped taking metoprolol 5 days ago as his HR was dropping to 50s and he thought it was causing hypoxia. He was evaluated by Dr. Ladona Ridgel and has started taking Eliquis 5 mg once daily.  He could not tolerate BID dose, had rectal bleeding, has history of GI bleeding with AC and aspirin. He is going to see Dr. Jimmey Ralph for consideration of Watchman device. He has oxygen desaturations especially at nighttime up to 80s.  O2 sat on room air: 93% O2 sat on ambulation: 86% O2 sat on ambulation with 2 LMP O2: 93%  He was given oxygen supplies prescription in the last visit, but could not get it.  CAD and PAD: He is placed on Imdur for episodes of chest pain, which has improved his symptoms. He had right, femoral endarterectomy with profundoplasty and and arterectomy of the proximal SFA with bovine patch on 12/14/23. He was seen by vascular surgeon in the outpatient setting after the procedure as well.  HLD: He has started taking Vytorin. He is on Repatha as well. His LDL is improved to 24 now.  CKD: His BMP showed stable GFR at 38, improved from 26. His dose of HCTZ was reduced in the last visit. Denies any dysuria, hematuria or urinary hesitancy or resistance.  He is trying  to improve fluid intake.  Anxiety/insomnia/PTSD: Currently well controlled with Xanax.  He gets intermittent episodes of severe anxiety spells, where he gets flushed and has mild dyspnea, but they have been less frequent recently.  He also takes Ambien for insomnia.  COPD: Has intermittent dyspnea and hypoxia spells at home, could be related to COPD as well.  He uses Symbicort twice daily and albuterol inhaler/nebulizer as needed for dyspnea or wheezing.   Past Medical History:  Diagnosis Date   A-fib [facility])    tx by Dr [name]   Asthma    Atrial flutter [facility])    Diagnosed by ECG August 2015   Bilateral hearing loss 12/02/2018   no hearing aids   CKD (chronic kidney disease)    Colitis    COPD (chronic obstructive pulmonary disease) (HCC)    Coronary atherosclerosis of native coronary artery    Multivessel status post CABG   Essential hypertension    Hyperlipidemia    Insomnia    Peripheral vascular disease (HCC) 11/11/2023   PTSD (post-traumatic stress disorder)     Past Surgical History:  Procedure Laterality Date   ABDOMINAL AORTOGRAM W/LOWER EXTREMITY Bilateral 12/03/2023   Procedure: ABDOMINAL AORTOGRAM W/LOWER EXTREMITY;  Surgeon: Cephus Shelling, MD;  Location: [facility] INVASIVE CV LAB;  Service: Cardiovascular;  Laterality: Bilateral;   COLONOSCOPY  01/29/2005   Dr. Jena Gauss; rectal polyp s/p polypectomy, otherwise normal.  Pathology with tubulovillous adenoma.   COLONOSCOPY  02/06/2008   Dr. Jena Gauss; minimal internal hemorrhoids, diminutive polyp in the splenic flexure s/p cold biopsy removal, otherwise normal.  Pathology with benign polypoid colonic mucosa.   COLONOSCOPY WITH PROPOFOL N/A 08/30/2015   Procedure: COLONOSCOPY WITH PROPOFOL at cecum at 0814; withdrawal time=8 minutes;  Surgeon: Corbin Ade, MD; internal hemorrhoids-likely source of hematochezia, otherwise normal exam.  Repeat in 5 years.   COLONOSCOPY WITH PROPOFOL N/A 01/28/2021   Procedure: COLONOSCOPY  WITH PROPOFOL;  Surgeon: Corbin Ade, MD;  Location: [facility] ENDO SUITE;  Service: Endoscopy;  Laterality: N/A;  am appt   CORONARY ARTERY BYPASS GRAFT  2005   Dr. Zenaida Niece Trigt: LIMA to LAD, right radial to circumflex, SVG to RCA   CORONARY ARTERY BYPASS GRAFT  10/16/2004   ELECTROPHYSIOLOGIC STUDY N/A 11/21/2016   Procedure: A-Flutter Ablation;  Surgeon: Marinus Maw, MD;  Location: [facility] INVASIVE CV LAB;  Service: Cardiovascular;  Laterality: N/A;   ENDARTERECTOMY FEMORAL Right 12/14/2023   Procedure: RIGHT ENDARTERECTOMY  COMMON FEMORAL PROFUNDOPLASTY WITH XENSURE BOVINE PATCH;  Surgeon: Cephus Shelling, MD;  Location: [facility] OR;  Service: Vascular;  Laterality: Right;   TEE WITHOUT CARDIOVERSION N/A 11/21/2016   Procedure: TRANSESOPHAGEAL ECHOCARDIOGRAM (TEE);  Surgeon: Thurmon Fair, MD;  Location: [facility] ENDOSCOPY;  Service: Cardiovascular;  Laterality: N/A;    Family History  Problem Relation Age of Onset   Hypertension Mother    Alcohol abuse Mother    Alcohol abuse Father    Colon cancer Neg Hx     Social History   Socioeconomic History   Marital status: Married    Spouse name: Not on file   Number of children: 3   Years of education: Not on file   Highest education level: Not on file  Occupational History   Not on file  Tobacco Use   Smoking status: Former    Current packs/day: 0.00    Average packs/day: 2.0 packs/day for 27.0 years (54.0 ttl pk-yrs)    Types: Cigarettes    Start date: 02/01/1971    Quit date: 01/31/1998    Years since quitting: 26.0   Smokeless tobacco: Current    Types: Chew  Vaping Use   Vaping status: Never Used  Substance and Sexual Activity   Alcohol use: Yes    Alcohol/week: 14.0 - 28.0 standard drinks of alcohol    Types: 14 - 28 Cans of beer per week    Comment: Drinks beer daily, typically 2-4 a day.   Drug use: No   Sexual activity: Yes  Other Topics Concern   Not on file  Social History Narrative   Lives with wife, Victorino Dike   Social  Drivers of Health   Financial Resource Strain: Medium Risk (08/05/2021)   Overall Financial Resource Strain (CARDIA)    Difficulty of Paying Living Expenses: Somewhat hard  Food Insecurity: No Food Insecurity (12/16/2023)   Hunger Vital Sign    Worried About Running Out of Food in the Last Year: Never true    Ran Out of Food in the Last Year: Never true  Transportation Needs: No Transportation Needs (12/16/2023)   PRAPARE - Administrator, Civil Service (Medical): No    Lack of Transportation (Non-Medical): No  Physical Activity: Insufficiently Active (08/05/2021)   Exercise Vital Sign    Days of Exercise per Week: 3 days    Minutes of Exercise per Session: 30 min  Stress: No Stress Concern Present (08/05/2021)  [employer] of Occupational Health - Occupational Stress Questionnaire    Feeling of Stress : Only a little  Social Connections: Unknown (12/14/2023)   Social Connection and Isolation Panel [NHANES]    Frequency of Communication with Friends and Family: More than three times a week    Frequency of Social Gatherings with Friends and Family: More than three times a week    Attends Religious Services: Not on file    Active Member of Clubs or Organizations: Not on file    Attends Banker Meetings: Not on file    Marital Status: Not on file  Intimate Partner Violence: Not At Risk (12/16/2023)   Humiliation, Afraid, Rape, and Kick questionnaire    Fear of Current or Ex-Partner: No    Emotionally Abused: No    Physically Abused: No    Sexually Abused: No    Outpatient Medications Prior to Visit  Medication Sig Dispense Refill   albuterol (PROVENTIL) (2.5 MG/3ML) 0.083% nebulizer solution Take 3 mLs (2.5 mg total) by nebulization every 6 (six) hours as needed for wheezing or shortness of breath. 360 mL 3   albuterol (VENTOLIN HFA) 108 (90 Base) MCG/ACT inhaler INHALE 2 PUFFS INTO THE LUNGS EVERY 4 HOURS AS NEEDED FOR WHEEZE 18 each 5   amLODipine  (NORVASC) 5 MG tablet Take 1 tablet (5 mg total) by mouth daily. 90 tablet 1   apixaban (ELIQUIS) 5 MG TABS tablet Take 1 tablet (5 mg total) by mouth 2 (two) times daily.     aspirin EC 81 MG tablet Take 81 mg by mouth every other day. Swallow whole.     budesonide-formoterol (SYMBICORT) 160-4.5 MCG/ACT inhaler TAKE 2 PUFFS FIRST THING IN MORNING AND THEN ANOTHER 2 PUFFS ABOUT 12 HOURS LATER. 30.6 each 1   Cholecalciferol (VITAMIN D3) 125 MCG (5000 UT) CAPS Take 5,000 Units by mouth daily.     Evolocumab (REPATHA SURECLICK) 140 MG/ML SOAJ INJECT 140 MG INTO THE SKIN EVERY 14 (FOURTEEN) DAYS. 1 mL 1   ezetimibe-simvastatin (VYTORIN) 10-40 MG tablet TAKE 1 TABLET BY MOUTH EVERY DAY 90 tablet 1   fenofibrate 160 MG tablet TAKE 1 TABLET BY MOUTH EVERY DAY 90 tablet 1   fish oil-omega-3 fatty acids 1000 MG capsule Take 1 g by mouth at bedtime.     isosorbide mononitrate (IMDUR) 30 MG 24 hr tablet Take 30 mg by mouth daily.     losartan-hydrochlorothiazide (HYZAAR) 100-12.5 MG tablet Take 1 tablet by mouth daily. 90 tablet 1   nitroGLYCERIN (NITROSTAT) 0.4 MG SL tablet PLACE 1 TABLET (0.4 MG TOTAL) UNDER THE TONGUE EVERY 5 (FIVE) MINUTES AS NEEDED. 25 tablet 3   Respiratory Therapy Supplies (NEBULIZER/TUBING/MOUTHPIECE) KIT Nebulizer tubing and mouthpiece 1 kit 0   traMADol (ULTRAM) 50 MG tablet Take 1 tablet (50 mg total) by mouth every 6 (six) hours as needed. 20 tablet 0   ALPRAZolam (XANAX) 0.5 MG tablet TAKE 1 TABLET BY MOUTH 2 TIMES DAILY AS NEEDED FOR ANXIETY. 30 tablet 3   dronedarone (MULTAQ) 400 MG tablet Take 1 tablet (400 mg total) by mouth 2 (two) times daily with a meal. 60 tablet 11   zolpidem (AMBIEN) 10 MG tablet TAKE 1 TABLET BY MOUTH AT BEDTIME AS NEEDED FOR SLEEP (Patient taking differently: Take 10 mg by mouth at bedtime. for sleep) 30 tablet 3   metoprolol tartrate (LOPRESSOR) 50 MG tablet TAKE 1 TABLET BY MOUTH TWICE A DAY (Patient not taking: Reported on 02/12/2024) 180 tablet 1  No facility-administered medications prior to visit.    Allergies  Allergen Reactions   Ace Inhibitors Shortness Of Breath and Cough   Enalapril Cough    Pt has been switched to Valsartan and is tolerating the different class.   Other     Vein in Right Leg and Right Arm is missing due to a open heart surgery in 2005   Oxycodone Itching   Dexamethasone Itching    Pt was prescribed this in december   Alirocumab Rash and Other (See Comments)    Blisters *Praluent   Clotrimazole-Betamethasone Rash   Prednisone Rash    ROS Review of Systems  Constitutional:  Negative for chills and fever.  HENT:  Negative for congestion and sore throat.   Eyes:  Negative for pain and discharge.  Respiratory:  Positive for shortness of breath. Negative for cough.   Cardiovascular:  Positive for palpitations. Negative for chest pain.  Gastrointestinal:  Negative for diarrhea, nausea and vomiting.  Endocrine: Negative for polydipsia and polyuria.  Genitourinary:  Negative for dysuria, flank pain and hematuria.       Nocturia  Musculoskeletal:  Negative for neck pain and neck stiffness.       Leg pain  Skin:  Positive for rash.       Skin tag in axillae  Neurological:  Negative for dizziness and weakness.  Psychiatric/Behavioral:  Positive for sleep disturbance. Negative for agitation, behavioral problems, dysphoric mood and suicidal ideas. The patient is nervous/anxious.       Objective:    Physical Exam Vitals reviewed.  Constitutional:      General: He is not in acute distress.    Appearance: He is not diaphoretic.  HENT:     Head: Normocephalic and atraumatic.     Nose: Nose normal.     Mouth/Throat:     Mouth: Mucous membranes are moist.  Eyes:     General: No scleral icterus.    Extraocular Movements: Extraocular movements intact.  Cardiovascular:     Rate and Rhythm: Normal rate and regular rhythm.     Heart sounds: Normal heart sounds. No murmur heard. Pulmonary:      Breath sounds: Normal breath sounds. No wheezing or rales.  Musculoskeletal:     Cervical back: Neck supple. No tenderness.     Right lower leg: No edema.     Left lower leg: No edema.  Skin:    General: Skin is warm.     Findings: No rash.     Comments: Skin tags in bilateral axillae  Neurological:     General: No focal deficit present.     Mental Status: He is alert and oriented to person, place, and time.     Motor: Weakness (Bilateral LE-4/5) present.  Psychiatric:        Mood and Affect: Mood normal.        Behavior: Behavior normal.     BP 130/67   Pulse 93   Ht 6\' 1"  (1.854 m)   Wt 214 lb 3.2 oz (97.2 kg)   SpO2 93%   BMI 28.26 kg/m  Wt Readings from Last 3 Encounters:  02/12/24 214 lb 3.2 oz (97.2 kg)  01/26/24 215 lb (97.5 kg)  01/11/24 212 lb 6.4 oz (96.3 kg)    Lab Results  Component Value Date   TSH 4.470 02/03/2023   Lab Results  Component Value Date   WBC 10.5 12/15/2023   HGB 10.9 (L) 12/15/2023   HCT 32.4 (L) 12/15/2023  MCV 91.8 12/15/2023   PLT 149 (L) 12/15/2023   Lab Results  Component Value Date   NA 130 (L) 12/15/2023   K 4.2 12/15/2023   CO2 25 12/15/2023   GLUCOSE 156 (H) 12/15/2023   BUN 31 (H) 12/15/2023   CREATININE 1.89 (H) 12/15/2023   BILITOT 0.8 12/14/2023   ALKPHOS 18 (L) 12/14/2023   AST 24 12/14/2023   ALT 16 12/14/2023   PROT 6.3 (L) 12/14/2023   ALBUMIN 3.7 12/14/2023   CALCIUM 8.6 (L) 12/15/2023   ANIONGAP 9 12/15/2023   EGFR 34 (L) 11/03/2023   GFR 60.41 08/13/2015   Lab Results  Component Value Date   CHOL 80 12/15/2023   Lab Results  Component Value Date   HDL 44 12/15/2023   Lab Results  Component Value Date   LDLCALC 24 12/15/2023   Lab Results  Component Value Date   TRIG 59 12/15/2023   Lab Results  Component Value Date   CHOLHDL 1.8 12/15/2023   Lab Results  Component Value Date   HGBA1C 5.9 (H) 06/25/2023      Assessment & Plan:   Problem List Items Addressed This Visit        Cardiovascular and Mediastinum   PAD (peripheral artery disease) (HCC)   Followed by Vascular surgery - On 12/14/2023 he underwent right common femoral endarterectomy with profundoplasty and endarterectomy of the proximal SFA with bovine patch.  Had leg claudication symptoms, improved now Was on aspirin in the past, but had recurrent GI bleeding with it - continue QOD for now On Repatha      Relevant Medications   isosorbide mononitrate (IMDUR) 30 MG 24 hr tablet   Paroxysmal atrial fibrillation (HCC) - Primary   Recent Zio patch monitor showed paroxysmal A Fib On metoprolol for rate control, but he has discontinued it On aspirin 81 mg QD and Eliquis 5 mg QD (had rectal bleeding with BID dosing) Did not tolerate anticoagulation in the past, had GI bleeding Planned to see EP cardiology-Dr. Jimmey Ralph      Relevant Medications   isosorbide mononitrate (IMDUR) 30 MG 24 hr tablet   Other Relevant Orders   TSH + free T4     Respiratory   COPD (chronic obstructive pulmonary disease) (HCC)   Quit smoking in 1999 On Symbicort and Albuterol Advised to comply with Symbicort to avoid frequent exacerbations Undergoing pulmonary rehab program - paperwork signed for [place] forever fit program      Relevant Orders   For home use only DME oxygen   Chronic respiratory failure with hypoxia (HCC)   Likely from A-fib and COPD Would benefit from home oxygen for as needed use Prescribed home O2 through [employer]      Relevant Orders   For home use only DME oxygen     Genitourinary   Chronic kidney disease, stage 3b (HCC)   CMP reviewed GFR usually around 40 Will monitor for now On ARB Decreased dose of diuretic Advised for proper hydration Avoid nephrotoxic agents      Relevant Orders   CBC   CMP14+EGFR     Other   HLD (hyperlipidemia) (Chronic)   Used to follow up with lipid clinic Was on Praluent, had severe fatigue and rash with it -could not tolerate it Checked lipid profile  - LDL at goal now, on Repatha and Vytorin now      Relevant Medications   isosorbide mononitrate (IMDUR) 30 MG 24 hr tablet   Other Relevant Orders  Lipid panel   Anxiety (Chronic)   Takes Xanax PRN, for panic episodes Associated with previous incident with his wife (cardiac arrest) Symptoms better now Takes Ambien 10 mg QD for insomnia      Relevant Medications   ALPRAZolam (XANAX) 0.5 MG tablet   Insomnia (Chronic)   Takes Ambien as needed      Relevant Medications   zolpidem (AMBIEN) 10 MG tablet      Meds ordered this encounter  Medications   ALPRAZolam (XANAX) 0.5 MG tablet    Sig: Take 1 tablet (0.5 mg total) by mouth 2 (two) times daily as needed for anxiety.    Dispense:  30 tablet    Refill:  3    Not to exceed 2 additional fills before 12/27/2023   zolpidem (AMBIEN) 10 MG tablet    Sig: Take 1 tablet (10 mg total) by mouth at bedtime as needed. for sleep    Dispense:  30 tablet    Refill:  3    Follow-up: Return in about 4 months (around 06/13/2024) for HTN and A Fib.    Anabel Halon, MD

## 2024-02-16 ENCOUNTER — Telehealth: Payer: Self-pay | Admitting: Internal Medicine

## 2024-02-16 NOTE — Telephone Encounter (Signed)
 Spoke to pt provided him with phone number to [employer].

## 2024-02-16 NOTE — Telephone Encounter (Signed)
 Copied from CRM [id number]. Topic: General - Other >> Feb 16, 2024 10:01 AM Marland Kitchen D wrote: Patient needs a contact number for the oxygen supplier so he can setup a date and time to have them deliver it.

## 2024-02-17 DIAGNOSIS — J449 Chronic obstructive pulmonary disease, unspecified: Secondary | ICD-10-CM | POA: Diagnosis not present

## 2024-02-21 NOTE — Progress Notes (Unsigned)
 Electrophysiology Office Note:   Date:  02/22/2024  ID:  Dennis [name], DOB [birth year], MRN [id number]  Primary Cardiologist: Nona Dell, MD Electrophysiologist: Nobie Putnam, MD      History of Present Illness:   Dennis [name] is a 73 y.o. male with h/o hypertension, hyperlipidemia, COPD, CAD s/p CABG, atrial flutter s/p ablation, paroxysmal atrial fibrillation, PAD s/p right common femoral endarterectomy for claudication who is being seen today for evaluation for catheter ablation at the request of Dr. Ladona Ridgel.  Discussed the use of AI scribe software for clinical note transcription with the patient, who gave verbal consent to proceed.  History of Present Illness He has a history of atrial fibrillation following a triple bypass surgery in 2005. Post-surgery, he developed an irregular heartbeat, which was managed until a severe episode in 2017 led to an ablation procedure.  He underwent ablation of typical atrial flutter.  This provided relief for approximately five to six years. In September 2024, he experienced a significant episode characterized by low oxygen, fluctuating blood pressure, weakness, and a general feeling of being unwell. An EKG showed irregularities, but a definitive diagnosis was not made. A week-long heart monitor confirmed a two-hour episode of atrial fibrillation. He was initially treated with Multaq for atrial fibrillation, but it interfered with his breathing due to his COPD, leading to discontinuation. He was then prescribed Eliquis, a blood thinner, but experienced rectal bleeding, likely exacerbated by his history of internal hemorrhoids and past colitis diagnosis. To manage this, he adjusted his Eliquis dosage to half a tablet twice a day, which reduced the bleeding. He has COPD and has been on metoprolol for over twenty years to manage his heart rate, which he self-adjusted due to episodes of bradycardia. He experiences symptoms of atrial fibrillation such as  irregular heartbeats and difficulty breathing, which he can audibly perceive, especially in quiet environments.   Review of systems complete and found to be negative unless listed in HPI.   EP Information / Studies Reviewed:    EKG is not ordered today. EKG from 09/07/23 reviewed which showed sinus rhythm with PVC.      Zio 11/03/23: Predominant rhythm is sinus with prolonged PR interval, heart rate ranging from 51 bpm up to 96 bpm and average heart rate 67 bpm. Paroxysmal atrial fibrillation was noted representing 6% total rhythm burden.  Longest episode lasted for 3 hours and 57 minutes. There were rare PACs including atrial couplets and triplets representing less than 1% total beats. There were occasional PVCs representing 3.7% total beats with otherwise rare ventricular couplets and triplets as well as limited episodes of ventricular bigeminy and trigeminy. No pauses or high degree heart block.  Some Wenckebach conduction was noted.  Echo 09/19/22:  1. Left ventricular ejection fraction, by estimation, is 55 to 60%. The  left ventricle has normal function. The left ventricle has no regional  wall motion abnormalities. There is mild left ventricular hypertrophy.  Left ventricular diastolic parameters  were normal.   2. Right ventricular systolic function is normal. The right ventricular  size is normal.   3. The mitral valve is normal in structure. No evidence of mitral valve  regurgitation. No evidence of mitral stenosis.   4. The aortic valve has an indeterminant number of cusps. There is mild  calcification of the aortic valve. There is mild thickening of the aortic  valve. Aortic valve regurgitation is not visualized. No aortic stenosis is  present.   5. Aortic dilatation noted. There is  mild dilatation of the aortic root,  measuring 40 mm.   6. The inferior vena cava is normal in size with greater than 50%  respiratory variability, suggesting right atrial pressure of 3  mmHg.   Nuclear Stress 09/19/22:    The study is low risk.   Small to moderate size moderate intensity inferior defect with mild reversibility and normal wall motion. Finding may represent prior inferior infarct with mild peri-infarct ischemia, however given preserved wall motion also consider slight differences in diaphragmatic attenuation artifact. Either finding would support low risk.   Left ventricular function is normal. Nuclear stress EF: 57 %. End diastolic cavity size is normal.  Risk Assessment/Calculations:    CHA2DS2-VASc Score = 4   This indicates a 4.8% annual risk of stroke. The patient's score is based upon: CHF History: 1 HTN History: 1 Diabetes History: 0 Stroke History: 0 Vascular Disease History: 1 Age Score: 1 Gender Score: 0             Physical Exam:   VS:  BP 127/74   Pulse 81   Ht 6\' 1"  (1.854 m)   SpO2 95%   BMI 28.26 kg/m    Wt Readings from Last 3 Encounters:  02/12/24 214 lb 3.2 oz (97.2 kg)  01/26/24 215 lb (97.5 kg)  01/11/24 212 lb 6.4 oz (96.3 kg)     GEN: Well nourished, well developed in no acute distress NECK: No JVD CARDIAC: Normal rate, regular rhythm. RESPIRATORY:  Clear to auscultation without rales, wheezing or rhonchi  ABDOMEN: Soft, non-distended EXTREMITIES:  No edema; No deformity   ASSESSMENT AND PLAN:    # Paroxysmal atrial fibrillation, symptomatic: Confirmed on Zio monitor. # Typical atrial flutter status post ablation: - Discussed treatment options today for AF including antiarrhythmic drug therapy and ablation. Discussed risks, recovery and likelihood of success with each treatment strategy.  Patient had intolerance to Multaq and is hesitant to take any antiarrhythmic drugs due to side effects.  Risk, benefits, and alternatives to EP study and ablation for afib were discussed. These risks include but are not limited to stroke, bleeding, vascular damage, tamponade, perforation, damage to the esophagus, lungs, phrenic  nerve and other structures, pulmonary vein stenosis, worsening renal function, coronary vasospasm and death.  Discussed potential need for repeat ablation procedures and antiarrhythmic drugs after an initial ablation. The patient understands these risk and wishes to proceed.  We will therefore proceed with catheter ablation at the next available time.  Carto, ICE, anesthesia are requested for the procedure.  Will also obtain CT PV protocol prior to the procedure to exclude LAA thrombus and further evaluate atrial anatomy.  We will try to arrange for ablation to be performed concomitantly with Watchman device implant. -Continue metoprolol 50 mg twice daily.  # CAD status post CABG: -Continue aspirin 81 mg once daily, ezetimibe-simvastatin 10-40 mg, Imdur 30 mg once daily, evolocumab 140 mg injection every 14 days.  # Hypertension -At goal today.  Recommend checking blood pressures 1-2 times per week at home and recording the values.  Recommend bringing these recordings to the primary care physician.  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 I have seen Cheick E Mancha in the office today who is being considered for a Watchman left atrial appendage closure device. I believe they will benefit from this procedure given their history of atrial fibrillation, CHA2DS2-VASc score of 4 and unadjusted ischemic stroke rate of 4.8% per year. Unfortunately, the  patient is not felt to be a long term anticoagulation candidate secondary to recurrent GI bleeding. The patient's chart has been reviewed and I feel that they would be a candidate for short term oral anticoagulation after Watchman implant.   It is my belief that after undergoing a LAA closure procedure, Takao E Urton will not need long term anticoagulation which eliminates anticoagulation side effects and major bleeding risk.   Procedural risks for the Watchman implant have been reviewed with  the patient including a 0.5% risk of stroke, <1% risk of perforation and <1% risk of device embolization. Other risks include bleeding, vascular damage, tamponade, worsening renal function, and death. The patient understands these risk and wishes to proceed.     The published clinical data on the safety and effectiveness of WATCHMAN include but are not limited to the following: - Holmes DR, Everlene Farrier, Sick P et al. for the PROTECT AF Investigators. Percutaneous closure of the left atrial appendage versus warfarin therapy for prevention of stroke in patients with atrial fibrillation: a randomised non-inferiority trial. Lancet 2009; 374: 534-42. Everlene Farrier, Doshi SK, Isa Rankin D et al. on behalf of the PROTECT AF Investigators. Percutaneous Left Atrial Appendage Closure for Stroke Prophylaxis in Patients With Atrial Fibrillation 2.3-Year Follow-up of the PROTECT AF (Watchman Left Atrial Appendage System for Embolic Protection in Patients With Atrial Fibrillation) Trial. Circulation 2013; 127:720-729. - Alli O, Doshi S,  Kar S, Reddy VY, Sievert H et al. Quality of Life Assessment in the Randomized PROTECT AF (Percutaneous Closure of the Left Atrial Appendage Versus Warfarin Therapy for Prevention of Stroke in Patients With Atrial Fibrillation) Trial of Patients at Risk for Stroke With Nonvalvular Atrial Fibrillation. J Am Coll Cardiol 2013; 61:1790-8. Aline August DR, Mia [place], Price M, Whisenant B, Sievert H, Doshi S, Huber K, Reddy V. Prospective randomized evaluation of the Watchman left atrial appendage Device in patients with atrial fibrillation versus long-term warfarin therapy; the PREVAIL trial. Journal of the [employer] of Cardiology, Vol. 4, No. 1, 2014, 1-11. - Kar S, Doshi SK, Sadhu A, Horton R, Osorio J et al. Primary outcome evaluation of a next-generation left atrial appendage closure device: results from the PINNACLE FLX trial. Circulation 2021;143(18)1754-1762.   After today's visit  with the patient which was dedicated solely for shared decision making visit regarding LAA closure device, the patient decided to proceed with the LAA appendage closure procedure scheduled to be done in the near future at [facility]. Prior to the procedure, I would like to obtain a gated CT scan of the chest with contrast timed for PV/LA visualization.  I have instructed patient to take Eliquis 5 mg twice daily and we will attempt to arrange for watchman implant to be performed concomitantly with atrial fibrillation ablation.  HAS-BLED score 3 Hypertension Yes  Abnormal renal and liver function (Dialysis, transplant, Cr >2.26 mg/dL /Cirrhosis or Bilirubin >2x Normal or AST/ALT/AP >3x Normal) No  Stroke No  Bleeding Yes  Labile INR (Unstable/high INR) No  Elderly (>65) Yes  Drugs or alcohol (>= 8 drinks/week, anti-plt or NSAID) No   CHA2DS2-VASc Score = 4  The patient's score is based upon: CHF History: 1 HTN History: 1 Diabetes History: 0 Stroke History: 0 Vascular Disease History: 1 Age Score: 1 Gender Score: 0      Signed, Nobie Putnam, MD

## 2024-02-22 ENCOUNTER — Encounter: Payer: Self-pay | Admitting: Cardiology

## 2024-02-22 ENCOUNTER — Ambulatory Visit: Payer: Medicare HMO | Attending: Cardiology | Admitting: Cardiology

## 2024-02-22 VITALS — BP 127/74 | HR 81 | Ht 73.0 in

## 2024-02-22 DIAGNOSIS — D6869 Other thrombophilia: Secondary | ICD-10-CM | POA: Diagnosis not present

## 2024-02-22 DIAGNOSIS — I10 Essential (primary) hypertension: Secondary | ICD-10-CM | POA: Diagnosis not present

## 2024-02-22 DIAGNOSIS — Z951 Presence of aortocoronary bypass graft: Secondary | ICD-10-CM

## 2024-02-22 DIAGNOSIS — I480 Paroxysmal atrial fibrillation: Secondary | ICD-10-CM | POA: Diagnosis not present

## 2024-02-22 DIAGNOSIS — K922 Gastrointestinal hemorrhage, unspecified: Secondary | ICD-10-CM

## 2024-02-22 NOTE — Patient Instructions (Addendum)
 Medication Instructions:  Your physician recommends that you continue on your current medications as directed. Please refer to the Current Medication list given to you today.  *If you need a refill on your cardiac medications before your next appointment, please call your pharmacy*  Lab Work: BMET and CBC - you may go to any [facility] location to have these drawn within 30 days of your procedure  Testing/Procedures: Cardiac CT Your physician has requested that you have cardiac CT. Cardiac computed tomography (CT) is a painless test that uses an x-ray machine to take clear, detailed pictures of your heart. For further information please visit [email or web address] Please follow instruction sheet as given. We will call you to schedule your CT scan. It will be done about three weeks prior to your ablation.  Ablation Your physician has recommended that you have an ablation. Catheter ablation is a medical procedure used to treat some cardiac arrhythmias (irregular heartbeats). During catheter ablation, a long, thin, flexible tube is put into a blood vessel in your groin (upper thigh), or neck. This tube is called an ablation catheter. It is then guided to your heart through the blood vessel. Radio frequency waves destroy small areas of heart tissue where abnormal heartbeats may cause an arrhythmia to start. Please see the instruction sheet given to you today.  Watchman Your physician has requested that you have Left atrial appendage (LAA) closure device implantation is a procedure to put a small device in the LAA of the heart. The LAA is a small sac in the wall of the heart's left upper chamber. Blood clots can form in this area. The device, Watchman closes the LAA to help prevent a blood clot and stroke.   Follow-Up: At [facility], you and your health needs are our priority.  As part of our continuing mission to provide you with exceptional heart care, we have created designated Provider Care  Teams.  These Care Teams include your primary Cardiologist (physician) and Advanced Practice Providers (APPs -  Physician Assistants and Nurse Practitioners) who all work together to provide you with the care you need, when you need it.   Your next appointment:   We will contact you about your post-procedure follow up appointments.     1st Floor: - Lobby - Registration  - Pharmacy  - Lab - Cafe  2nd Floor: - PV Lab - Diagnostic Testing (echo, CT, nuclear med)  3rd Floor: - Vacant  4th Floor: - TCTS (cardiothoracic surgery) - AFib Clinic - Structural Heart Clinic - Vascular Surgery  - Vascular Ultrasound  5th Floor: - HeartCare Cardiology (general and EP) - Clinical Pharmacy for coumadin, hypertension, lipid, weight-loss medications, and med management appointments    Valet parking services will be available as well.

## 2024-02-24 ENCOUNTER — Ambulatory Visit: Payer: Medicare Other | Admitting: Internal Medicine

## 2024-02-26 ENCOUNTER — Other Ambulatory Visit: Payer: Self-pay | Admitting: Cardiology

## 2024-02-26 ENCOUNTER — Telehealth: Payer: Self-pay | Admitting: Cardiology

## 2024-02-26 ENCOUNTER — Other Ambulatory Visit: Payer: Self-pay | Admitting: Internal Medicine

## 2024-02-26 DIAGNOSIS — G4700 Insomnia, unspecified: Secondary | ICD-10-CM

## 2024-02-26 DIAGNOSIS — I10 Essential (primary) hypertension: Secondary | ICD-10-CM

## 2024-02-26 NOTE — Telephone Encounter (Signed)
 Pt called in stating he would like to set up only the ablation not the watchman. Please advise.

## 2024-03-01 NOTE — Telephone Encounter (Signed)
 Spoke with the patient's wife who states that the patient is currently taking a nap. She will have him call us back later to schedule an ablation.

## 2024-03-02 ENCOUNTER — Telehealth: Payer: Self-pay | Admitting: Pharmacy Technician

## 2024-03-02 ENCOUNTER — Other Ambulatory Visit (HOSPITAL_COMMUNITY): Payer: Self-pay

## 2024-03-02 ENCOUNTER — Encounter: Payer: Self-pay | Admitting: Internal Medicine

## 2024-03-02 NOTE — Telephone Encounter (Signed)
 Spoke with the patient and scheduled him for an ablation on 5/15. He does not wish to proceed with watchman at this time.

## 2024-03-02 NOTE — Telephone Encounter (Signed)
 Patient returned RN's call to schedule appointment for ablation.

## 2024-03-02 NOTE — Telephone Encounter (Signed)
 Pharmacy Patient Advocate Encounter   Received notification from Patient Advice Request messages that prior authorization for Zolpidem Tartrate 10MG  tablets is required/requested.   Insurance verification completed.   The patient is insured through [employer] .   Per test claim: PA required; PA started via CoverMyMeds. KEY [id number] . Please see clinical question(s) below that I am not finding the answer to in her chart and advise.

## 2024-03-02 NOTE — Telephone Encounter (Signed)
 PA request has been Started. New Encounter has been or will be created for follow up. For additional info see Pharmacy Prior Auth telephone encounter from 03/02/2024.

## 2024-03-07 ENCOUNTER — Other Ambulatory Visit: Payer: Self-pay | Admitting: Internal Medicine

## 2024-03-07 DIAGNOSIS — F419 Anxiety disorder, unspecified: Secondary | ICD-10-CM

## 2024-03-09 NOTE — Telephone Encounter (Signed)
 Pharmacy Patient Advocate Encounter  Received notification from [employer] that Prior Authorization for Zolpidem Tartrate 10MG  tablets  has been DENIED.     This was an auto-denial due to PA expired before clinical questions were completed.  Denial letter for reference in the media tab.  Appeals pharmacist will file an appeal.

## 2024-03-10 ENCOUNTER — Telehealth: Payer: Self-pay | Admitting: Pharmacist

## 2024-03-10 NOTE — Telephone Encounter (Signed)
 Appeal has been submitted for zolpidem. Will advise when response is received, please be advised that most companies may take 30 days to make a decision. Appeal letter and supporting documentation have been faxed to [phone] on 03/10/2024 @5 :30 pm.  Thank you, Dene Fines, PharmD Clinical Pharmacist  [facility]  Direct Dial: [phone]

## 2024-03-11 ENCOUNTER — Ambulatory Visit: Payer: Medicare Other | Admitting: Internal Medicine

## 2024-03-14 ENCOUNTER — Other Ambulatory Visit (HOSPITAL_COMMUNITY): Payer: Self-pay

## 2024-03-14 NOTE — Telephone Encounter (Signed)
 Appeal for zolpidem  has been approved by the insurance through 11/23/2024.

## 2024-03-19 ENCOUNTER — Other Ambulatory Visit: Payer: Self-pay | Admitting: Internal Medicine

## 2024-03-19 DIAGNOSIS — I739 Peripheral vascular disease, unspecified: Secondary | ICD-10-CM

## 2024-03-19 DIAGNOSIS — J449 Chronic obstructive pulmonary disease, unspecified: Secondary | ICD-10-CM | POA: Diagnosis not present

## 2024-03-19 DIAGNOSIS — I25118 Atherosclerotic heart disease of native coronary artery with other forms of angina pectoris: Secondary | ICD-10-CM

## 2024-03-23 ENCOUNTER — Telehealth: Payer: Self-pay | Admitting: Cardiology

## 2024-03-23 ENCOUNTER — Other Ambulatory Visit: Payer: Self-pay

## 2024-03-23 DIAGNOSIS — I480 Paroxysmal atrial fibrillation: Secondary | ICD-10-CM | POA: Diagnosis not present

## 2024-03-23 NOTE — Telephone Encounter (Signed)
 Left message for patient to call back

## 2024-03-23 NOTE — Telephone Encounter (Signed)
 Patient wants a call back to discuss upcoming procedure and questions about symptoms.  Patient noted he will not be available until after 1:00 pm.

## 2024-03-24 LAB — CBC
Hematocrit: 41 % (ref 37.5–51.0)
Hemoglobin: 13.3 g/dL (ref 13.0–17.7)
MCH: 29.1 pg (ref 26.6–33.0)
MCHC: 32.4 g/dL (ref 31.5–35.7)
MCV: 90 fL (ref 79–97)
Platelets: 167 10*3/uL (ref 150–450)
RBC: 4.57 x10E6/uL (ref 4.14–5.80)
RDW: 11.9 % (ref 11.6–15.4)
WBC: 7.2 10*3/uL (ref 3.4–10.8)

## 2024-03-24 LAB — BASIC METABOLIC PANEL WITH GFR
BUN/Creatinine Ratio: 13 (ref 10–24)
BUN: 18 mg/dL (ref 8–27)
CO2: 24 mmol/L (ref 20–29)
Calcium: 9.6 mg/dL (ref 8.6–10.2)
Chloride: 95 mmol/L — ABNORMAL LOW (ref 96–106)
Creatinine, Ser: 1.44 mg/dL — ABNORMAL HIGH (ref 0.76–1.27)
Glucose: 103 mg/dL — ABNORMAL HIGH (ref 70–99)
Potassium: 4.6 mmol/L (ref 3.5–5.2)
Sodium: 135 mmol/L (ref 134–144)
eGFR: 52 mL/min/{1.73_m2} — ABNORMAL LOW (ref >59)

## 2024-03-27 ENCOUNTER — Other Ambulatory Visit: Payer: Self-pay | Admitting: Internal Medicine

## 2024-03-27 DIAGNOSIS — I10 Essential (primary) hypertension: Secondary | ICD-10-CM

## 2024-03-30 ENCOUNTER — Ambulatory Visit (HOSPITAL_COMMUNITY)
Admission: RE | Admit: 2024-03-30 | Discharge: 2024-03-30 | Disposition: A | Discharge status: Home / Self Care | Source: Ambulatory Visit | Attending: Cardiology | Admitting: Cardiology

## 2024-03-30 DIAGNOSIS — I480 Paroxysmal atrial fibrillation: Secondary | ICD-10-CM | POA: Diagnosis not present

## 2024-03-30 MED ORDER — IOHEXOL 350 MG/ML SOLN
100.0000 mL | Freq: Once | INTRAVENOUS | Status: AC | PRN
Start: 2024-03-30 — End: 2024-03-30
  Administered 2024-03-30: 100 mL via INTRAVENOUS

## 2024-03-31 ENCOUNTER — Telehealth (HOSPITAL_COMMUNITY): Payer: Self-pay

## 2024-03-31 NOTE — Telephone Encounter (Signed)
 Spoke with patient to discuss upcoming procedure.   CT: completed.  Labs: completed.   Any recent signs of acute illness or been started on antibiotics? Patient reports he had a URI about 3 weeks ago and was on Amoxicillin  and prednisone . States he has since recovered about 1.5 weeks now.  Any medications to hold? No  Any missed doses of blood thinner? No Advised patient to continue taking ANTICOAGULANT: Eliquis  (Apixaban ) twice daily without missing any doses.  Medication instructions:  On the morning of your procedure DO NOT take any medication., including Eliquis  or the procedure may be rescheduled. Nothing to eat or drink after midnight prior to your procedure.  Confirmed patient is scheduled for Atrial Fibrillation Ablation on Thursday, May 15 with Dr. Clinton Danas. Instructed patient to arrive at the Main Entrance A at [facility]: [address] [place], [place] [address] and check in at Admitting at 8:00 AM.   Advised of plan to go home the same day and will only stay overnight if medically necessary. You MUST have a responsible adult to drive you home and MUST be with you the first 24 hours after you arrive home or your procedure could be cancelled.  Patient verbalized understanding to all instructions provided and agreed to proceed with procedure.

## 2024-04-05 NOTE — Telephone Encounter (Signed)
 See phone note from 5/8

## 2024-04-06 NOTE — Pre-Procedure Instructions (Signed)
 Instructed patient on the following items: Arrival time 0800 Nothing to eat or drink after midnight No meds AM of procedure Responsible person to drive you home and stay with you for 24 hrs  Have you missed any doses of anti-coagulant Eliquis - takes Eliquis  twice a day, hasn't missed any doses.  Don't take morning of procedure.

## 2024-04-07 ENCOUNTER — Encounter (HOSPITAL_COMMUNITY)
Admission: RE | Disposition: A | Discharge status: Home / Self Care | Payer: Self-pay | Source: Home / Self Care | Attending: Cardiology

## 2024-04-07 ENCOUNTER — Ambulatory Visit (HOSPITAL_COMMUNITY): Payer: Self-pay | Admitting: Anesthesiology

## 2024-04-07 ENCOUNTER — Encounter (HOSPITAL_COMMUNITY): Payer: Self-pay | Admitting: Cardiology

## 2024-04-07 ENCOUNTER — Ambulatory Visit (HOSPITAL_BASED_OUTPATIENT_CLINIC_OR_DEPARTMENT_OTHER): Payer: Self-pay | Admitting: Anesthesiology

## 2024-04-07 ENCOUNTER — Ambulatory Visit (HOSPITAL_COMMUNITY)
Admission: RE | Admit: 2024-04-07 | Discharge: 2024-04-07 | Disposition: A | Discharge status: Home / Self Care | Attending: Cardiology | Admitting: Cardiology

## 2024-04-07 ENCOUNTER — Other Ambulatory Visit: Payer: Self-pay

## 2024-04-07 DIAGNOSIS — Z87891 Personal history of nicotine dependence: Secondary | ICD-10-CM | POA: Insufficient documentation

## 2024-04-07 DIAGNOSIS — E785 Hyperlipidemia, unspecified: Secondary | ICD-10-CM | POA: Diagnosis not present

## 2024-04-07 DIAGNOSIS — I480 Paroxysmal atrial fibrillation: Secondary | ICD-10-CM | POA: Diagnosis not present

## 2024-04-07 DIAGNOSIS — Z7901 Long term (current) use of anticoagulants: Secondary | ICD-10-CM | POA: Diagnosis not present

## 2024-04-07 DIAGNOSIS — J449 Chronic obstructive pulmonary disease, unspecified: Secondary | ICD-10-CM | POA: Diagnosis not present

## 2024-04-07 DIAGNOSIS — N1832 Chronic kidney disease, stage 3b: Secondary | ICD-10-CM

## 2024-04-07 DIAGNOSIS — Z79899 Other long term (current) drug therapy: Secondary | ICD-10-CM | POA: Insufficient documentation

## 2024-04-07 DIAGNOSIS — I739 Peripheral vascular disease, unspecified: Secondary | ICD-10-CM | POA: Insufficient documentation

## 2024-04-07 DIAGNOSIS — I2510 Atherosclerotic heart disease of native coronary artery without angina pectoris: Secondary | ICD-10-CM | POA: Diagnosis not present

## 2024-04-07 DIAGNOSIS — I129 Hypertensive chronic kidney disease with stage 1 through stage 4 chronic kidney disease, or unspecified chronic kidney disease: Secondary | ICD-10-CM | POA: Insufficient documentation

## 2024-04-07 DIAGNOSIS — Z951 Presence of aortocoronary bypass graft: Secondary | ICD-10-CM | POA: Insufficient documentation

## 2024-04-07 DIAGNOSIS — N189 Chronic kidney disease, unspecified: Secondary | ICD-10-CM | POA: Diagnosis not present

## 2024-04-07 DIAGNOSIS — D6869 Other thrombophilia: Secondary | ICD-10-CM | POA: Insufficient documentation

## 2024-04-07 DIAGNOSIS — I4891 Unspecified atrial fibrillation: Secondary | ICD-10-CM | POA: Diagnosis present

## 2024-04-07 HISTORY — PX: ATRIAL FIBRILLATION ABLATION: EP1191

## 2024-04-07 LAB — POCT ACTIVATED CLOTTING TIME: Activated Clotting Time: 291 s

## 2024-04-07 SURGERY — ATRIAL FIBRILLATION ABLATION
Anesthesia: General

## 2024-04-07 MED ORDER — PROPOFOL 10 MG/ML IV BOLUS
INTRAVENOUS | Status: DC | PRN
  Administered 2024-04-07: 150 mg via INTRAVENOUS

## 2024-04-07 MED ORDER — ROCURONIUM BROMIDE 10 MG/ML (PF) SYRINGE
PREFILLED_SYRINGE | INTRAVENOUS | Status: DC | PRN
  Administered 2024-04-07: 10 mg via INTRAVENOUS
  Administered 2024-04-07: 60 mg via INTRAVENOUS
  Administered 2024-04-07: 10 mg via INTRAVENOUS

## 2024-04-07 MED ORDER — PROTAMINE SULFATE 10 MG/ML IV SOLN
INTRAVENOUS | Status: DC | PRN
  Administered 2024-04-07: 35 mg via INTRAVENOUS

## 2024-04-07 MED ORDER — SODIUM CHLORIDE 0.9 % IV SOLN: 250.0000 mL | INTRAVENOUS | Status: DC | PRN

## 2024-04-07 MED ORDER — SODIUM CHLORIDE 0.9% FLUSH: 3.0000 mL | INTRAVENOUS | Status: DC | PRN

## 2024-04-07 MED ORDER — SODIUM CHLORIDE 0.9 % IV SOLN: INTRAVENOUS | Status: DC

## 2024-04-07 MED ORDER — APIXABAN 5 MG PO TABS
5.0000 mg | ORAL_TABLET | Freq: Once | ORAL | Status: AC
  Administered 2024-04-07: 5 mg via ORAL
  Filled 2024-04-07: qty 1

## 2024-04-07 MED ORDER — EPHEDRINE SULFATE-NACL 50-0.9 MG/10ML-% IV SOSY
PREFILLED_SYRINGE | INTRAVENOUS | Status: DC | PRN
  Administered 2024-04-07 (×2): 5 mg via INTRAVENOUS

## 2024-04-07 MED ORDER — ACETAMINOPHEN 325 MG PO TABS: 650.0000 mg | ORAL_TABLET | ORAL | Status: DC | PRN

## 2024-04-07 MED ORDER — ATROPINE SULFATE 1 MG/ML IV SOLN
INTRAVENOUS | Status: DC | PRN
Start: 2024-04-07 — End: 2024-04-07
  Administered 2024-04-07: 1 mg via INTRAVENOUS

## 2024-04-07 MED ORDER — FENTANYL CITRATE (PF) 250 MCG/5ML IJ SOLN
INTRAMUSCULAR | Status: DC | PRN
  Administered 2024-04-07 (×2): 50 ug via INTRAVENOUS

## 2024-04-07 MED ORDER — PHENYLEPHRINE HCL-NACL 20-0.9 MG/250ML-% IV SOLN
INTRAVENOUS | Status: DC | PRN
  Administered 2024-04-07: 50 ug/min via INTRAVENOUS

## 2024-04-07 MED ORDER — SODIUM CHLORIDE 0.9% FLUSH: 3.0000 mL | Freq: Two times a day (BID) | INTRAVENOUS | Status: DC

## 2024-04-07 MED ORDER — LIDOCAINE 2% (20 MG/ML) 5 ML SYRINGE
INTRAMUSCULAR | Status: DC | PRN
  Administered 2024-04-07: 100 mg via INTRAVENOUS

## 2024-04-07 MED ORDER — ONDANSETRON HCL 4 MG/2ML IJ SOLN
INTRAMUSCULAR | Status: DC | PRN
  Administered 2024-04-07: 4 mg via INTRAVENOUS

## 2024-04-07 MED ORDER — FENTANYL CITRATE (PF) 100 MCG/2ML IJ SOLN
INTRAMUSCULAR | Status: AC
  Filled 2024-04-07: qty 2

## 2024-04-07 MED ORDER — HEPARIN (PORCINE) IN NACL 2000-0.9 UNIT/L-% IV SOLN
INTRAVENOUS | Status: DC | PRN
  Administered 2024-04-07 (×4): 1000 mL

## 2024-04-07 MED ORDER — HEPARIN SODIUM (PORCINE) 1000 UNIT/ML IJ SOLN
INTRAMUSCULAR | Status: DC | PRN
  Administered 2024-04-07: 16000 [IU] via INTRAVENOUS
  Administered 2024-04-07: 3000 [IU] via INTRAVENOUS

## 2024-04-07 MED ORDER — SUGAMMADEX SODIUM 200 MG/2ML IV SOLN
INTRAVENOUS | Status: DC | PRN
  Administered 2024-04-07: 384.8 mg via INTRAVENOUS

## 2024-04-07 MED ORDER — ONDANSETRON HCL 4 MG/2ML IJ SOLN: 4.0000 mg | Freq: Four times a day (QID) | INTRAMUSCULAR | Status: DC | PRN

## 2024-04-07 SURGICAL SUPPLY — 20 items
BAG SNAP BAND KOVER 36X36 (MISCELLANEOUS) IMPLANT
CABLE PFA RX CATH CONN (CABLE) IMPLANT
CATH BI DIR 7FR CS F-J 12 PIN (CATHETERS) IMPLANT
CATH FARAWAVE ABLATION 31 (CATHETERS) IMPLANT
CATH GE 8FR SOUNDSTAR (CATHETERS) IMPLANT
CATH OCTARAY 2.0 F 3-3-3-3-3 (CATHETERS) IMPLANT
CLOSURE PERCLOSE PROSTYLE (VASCULAR PRODUCTS) IMPLANT
COVER SWIFTLINK CONNECTOR (BAG) ×1 IMPLANT
DEVICE CLOSURE MYNXGRIP 6/7F (Vascular Products) IMPLANT
DILATOR VESSEL 38 20CM 16FR (INTRODUCER) IMPLANT
GUIDEWIRE INQWIRE 1.5J.035X260 (WIRE) IMPLANT
KIT VERSACROSS CNCT FARADRIVE (KITS) IMPLANT
PACK EP LF (CUSTOM PROCEDURE TRAY) ×1 IMPLANT
PAD DEFIB RADIO PHYSIO CONN (PAD) ×1 IMPLANT
PATCH CARTO3 (PAD) IMPLANT
SHEATH AVANTI 11CM 9FR (SHEATH) IMPLANT
SHEATH FARADRIVE STEERABLE (SHEATH) IMPLANT
SHEATH PINNACLE 8F 10CM (SHEATH) IMPLANT
SHEATH PROBE COVER 6X72 (BAG) IMPLANT
WIRE HI TORQ VERSACORE-J 145CM (WIRE) IMPLANT

## 2024-04-07 NOTE — Progress Notes (Signed)
 Patient walked to the bathroom, without difficulties. Bilateral groins level 0, clean, dry, and intact.

## 2024-04-07 NOTE — Transfer of Care (Signed)
 Immediate Anesthesia Transfer of Care Note  Patient: Dennis [name]  Procedure(s) Performed: ATRIAL FIBRILLATION ABLATION  Patient Location: PACU and Cath Lab  Anesthesia Type:General  Level of Consciousness: awake  Airway & Oxygen  Therapy: Patient Spontanous Breathing  Post-op Assessment: Report given to RN and Post -op Vital signs reviewed and stable  Post vital signs: Reviewed and stable  Last Vitals:  Vitals Value Taken Time  BP    Temp    Pulse 75 04/07/24 1214  Resp 16 04/07/24 1214  SpO2 94 % 04/07/24 1214  Vitals shown include unfiled device data.  Last Pain:  Vitals:   04/07/24 0821  TempSrc:   PainSc: 0-No pain         Complications: There were no known notable events for this encounter.

## 2024-04-07 NOTE — Anesthesia Postprocedure Evaluation (Signed)
 Anesthesia Post Note  Patient: Dennis [name]  Procedure(s) Performed: ATRIAL FIBRILLATION ABLATION     Patient location during evaluation: PACU Anesthesia Type: General Level of consciousness: awake and alert Pain management: pain level controlled Vital Signs Assessment: post-procedure vital signs reviewed and stable Respiratory status: spontaneous breathing, nonlabored ventilation, respiratory function stable and patient connected to nasal cannula oxygen  Cardiovascular status: blood pressure returned to baseline and stable Postop Assessment: no apparent nausea or vomiting Anesthetic complications: no   There were no known notable events for this encounter.  Last Vitals:  Vitals:   04/07/24 1430 04/07/24 1500  BP: 115/63 127/62  Pulse: 66 61  Resp: 15 15  Temp:    SpO2: 91% 91%    Last Pain:  Vitals:   04/07/24 1317  TempSrc:   PainSc: 0-No pain   Pain Goal:                   Laela Deviney

## 2024-04-07 NOTE — Anesthesia Preprocedure Evaluation (Addendum)
 Anesthesia Evaluation  Patient identified by MRN, date of birth, ID band Patient awake    Reviewed: Allergy & Precautions, NPO status , Patient's Chart, lab work & pertinent test results  History of Anesthesia Complications Negative for: history of anesthetic complications  Airway Mallampati: II  TM Distance: >3 FB Neck ROM: Full    Dental  (+) Dental Advisory Given, Edentulous Upper, Missing, Poor Dentition,    Pulmonary shortness of breath and with exertion, COPD, former smoker   Pulmonary exam normal breath sounds clear to auscultation       Cardiovascular Exercise Tolerance: Good hypertension, Pt. on medications and Pt. on home beta blockers + CAD, + CABG and + Peripheral Vascular Disease  Normal cardiovascular exam+ dysrhythmias Atrial Fibrillation  Rhythm:Regular Rate:Bradycardia     Neuro/Psych  PSYCHIATRIC DISORDERS Anxiety        GI/Hepatic negative GI ROS,,,(+)     substance abuse  alcohol use  Endo/Other  negative endocrine ROS    Renal/GU Renal InsufficiencyRenal disease     Musculoskeletal Mandibular mass   Abdominal   Peds  Hematology negative hematology ROS (+)   Anesthesia Other Findings   Reproductive/Obstetrics negative OB ROS                             Anesthesia Physical Anesthesia Plan  ASA: 3  Anesthesia Plan: General   Post-op Pain Management: Minimal or no pain anticipated   Induction: Intravenous  PONV Risk Score and Plan: 2 and Ondansetron , Dexamethasone  and Treatment may vary due to age or medical condition  Airway Management Planned: Oral ETT  Additional Equipment: None  Intra-op Plan:   Post-operative Plan: Extubation in OR  Informed Consent: I have reviewed the patients History and Physical, chart, labs and discussed the procedure including the risks, benefits and alternatives for the proposed anesthesia with the patient or authorized  representative who has indicated his/her understanding and acceptance.     Dental advisory given  Plan Discussed with: Anesthesiologist and CRNA  Anesthesia Plan Comments: (History includes former smoker (quit 01/31/98; +smokeless tobacco), COPD, HTN, HLD, CAD (s/p CABG 10/17/04: LIMA-LAD, right RA-CX, SVG-RCA), afib/flutter (aflutter ablation 11/21/16), PVD, CKD, colitis, PTSD, hearing aids.    Last visit with primary cardiologist Dr. Londa Rival was on 02/06/2023.  Patient had overall low risk stress test in October 2023 but with possible inferior distribution infarct scar with mild peri-infarct ischemia, although variable soft tissue attenuation also present.  Echo showed EF 55 to 60% with no regional wall motion abnormalities  Imdur  up-titrated but would continue medical therapy unless he symptomatic on medical therapy. Since then he had EP cardiologist follow-up with Dr. Carolynne Citron on 11/17/23. Recent monitor for palpitations showed 6% A-fib burden.  On beta-blocker therapy.  Previous a flutter ablation in 2017.  Options discussed, and he opted for Multaq .  He was also started on Eliquis  (instead of ASA). He denied anginal symptoms.  Dr. Carolynne Citron noted up coming PVD evaluation by Dr. Fulton Job.    CV: Long term (6 days 20 hours) Zio monitor 11/03/23: 1. Predominant rhythm is sinus with prolonged PR interval, heart rate ranging from 51 bpm up to 96 bpm and average heart rate 67 bpm. 2. Paroxysmal atrial fibrillation was noted representing 6% total rhythm burden.  Longest episode lasted for 3 hours and 57 minutes. 3. There were rare PACs including atrial couplets and triplets representing less than 1% total beats. 4. There were occasional PVCs representing 3.7% total  beats with otherwise rare ventricular couplets and triplets as well as limited episodes of ventricular bigeminy and trigeminy. 5. No pauses or high degree heart block.  Some Wenckebach conduction was noted.    Echo 09/19/22:  1. Left  ventricular ejection fraction, by estimation, is 55 to 60%. The  left ventricle has normal function. The left ventricle has no regional  wall motion abnormalities. There is mild left ventricular hypertrophy.  Left ventricular diastolic parameters  were normal.   2. Right ventricular systolic function is normal. The right ventricular  size is normal.   3. The mitral valve is normal in structure. No evidence of mitral valve  regurgitation. No evidence of mitral stenosis.   4. The aortic valve has an indeterminant number of cusps. There is mild  calcification of the aortic valve. There is mild thickening of the aortic  valve. Aortic valve regurgitation is not visualized. No aortic stenosis is  present.   5. Aortic dilatation noted. There is mild dilatation of the aortic root,  measuring 40 mm.   6. The inferior vena cava is normal in size with greater than 50%  respiratory variability, suggesting right atrial pressure of 3 mmHg.     Nuclear stress test 09/19/22:   The study is low risk.   Small to moderate size moderate intensity inferior defect with mild reversibility and normal wall motion. Finding may represent prior inferior infarct with mild peri-infarct ischemia, however given preserved wall motion also consider slight differences in diaphragmatic attenuation artifact. Either finding would support low risk.   Left ventricular function is normal. Nuclear stress EF: 57 %. End diastolic cavity size is normal.   US  Carotid 03/08/20: IMPRESSION: Color duplex indicates moderate heterogeneous and calcified plaque, with no hemodynamically significant stenosis by duplex criteria in the extracranial cerebrovascular circulation.  )        Anesthesia Quick Evaluation

## 2024-04-07 NOTE — H&P (Signed)
 Electrophysiology Note:   Date:  04/07/24  ID:  Dennis [name], DOB 1951/03/03, MRN [id number]   Primary Cardiologist: Teddie Favre, MD Electrophysiologist: Ardeen Kohler, MD       History of Present Illness:   Dennis [name] is a 73 y.o. male with h/o hypertension, hyperlipidemia, COPD, CAD s/p CABG, atrial flutter s/p ablation, paroxysmal atrial fibrillation, PAD s/p right common femoral endarterectomy for claudication who is being seen today for evaluation for catheter ablation at the request of Dr. Carolynne Citron.   Discussed the use of AI scribe software for clinical note transcription with the patient, who gave verbal consent to proceed.   History of Present Illness He has a history of atrial fibrillation following a triple bypass surgery in 2005. Post-surgery, he developed an irregular heartbeat, which was managed until a severe episode in 2017 led to an ablation procedure.  He underwent ablation of typical atrial flutter.  This provided relief for approximately five to six years. In September 2024, he experienced a significant episode characterized by low oxygen , fluctuating blood pressure, weakness, and a general feeling of being unwell. An EKG showed irregularities, but a definitive diagnosis was not made. A week-long heart monitor confirmed a two-hour episode of atrial fibrillation. He was initially treated with Multaq  for atrial fibrillation, but it interfered with his breathing due to his COPD, leading to discontinuation. He was then prescribed Eliquis , a blood thinner, but experienced rectal bleeding, likely exacerbated by his history of internal hemorrhoids and past colitis diagnosis. To manage this, he adjusted his Eliquis  dosage to half a tablet twice a day, which reduced the bleeding. He has COPD and has been on metoprolol  for over twenty years to manage his heart rate, which he self-adjusted due to episodes of bradycardia. He experiences symptoms of atrial fibrillation such as  irregular heartbeats and difficulty breathing, which he can audibly perceive, especially in quiet environments.    Interval: Patient reports feeling relatively well. No new or acute complaints. No missed doses of Eliquis  in past 10 days. Had missed a dose previous to that. In normal sinus rhythm today.   Review of systems complete and found to be negative unless listed in HPI.    EP Information / Studies Reviewed:     EKG is not ordered today. EKG from 09/07/23 reviewed which showed sinus rhythm with PVC.        Zio 11/03/23: Predominant rhythm is sinus with prolonged PR interval, heart rate ranging from 51 bpm up to 96 bpm and average heart rate 67 bpm. Paroxysmal atrial fibrillation was noted representing 6% total rhythm burden.  Longest episode lasted for 3 hours and 57 minutes. There were rare PACs including atrial couplets and triplets representing less than 1% total beats. There were occasional PVCs representing 3.7% total beats with otherwise rare ventricular couplets and triplets as well as limited episodes of ventricular bigeminy and trigeminy. No pauses or high degree heart block.  Some Wenckebach conduction was noted.  Echo 09/19/22:  1. Left ventricular ejection fraction, by estimation, is 55 to 60%. The  left ventricle has normal function. The left ventricle has no regional  wall motion abnormalities. There is mild left ventricular hypertrophy.  Left ventricular diastolic parameters  were normal.   2. Right ventricular systolic function is normal. The right ventricular  size is normal.   3. The mitral valve is normal in structure. No evidence of mitral valve  regurgitation. No evidence of mitral stenosis.   4. The aortic valve has  an indeterminant number of cusps. There is mild  calcification of the aortic valve. There is mild thickening of the aortic  valve. Aortic valve regurgitation is not visualized. No aortic stenosis is  present.   5. Aortic dilatation noted. There  is mild dilatation of the aortic root,  measuring 40 mm.   6. The inferior vena cava is normal in size with greater than 50%  respiratory variability, suggesting right atrial pressure of 3 mmHg.    Nuclear Stress 09/19/22:    The study is low risk.   Small to moderate size moderate intensity inferior defect with mild reversibility and normal wall motion. Finding may represent prior inferior infarct with mild peri-infarct ischemia, however given preserved wall motion also consider slight differences in diaphragmatic attenuation artifact. Either finding would support low risk.   Left ventricular function is normal. Nuclear stress EF: 57 %. End diastolic cavity size is normal.   Risk Assessment/Calculations:     CHA2DS2-VASc Score = 4   This indicates a 4.8% annual risk of stroke. The patient's score is based upon: CHF History: 1 HTN History: 1 Diabetes History: 0 Stroke History: 0 Vascular Disease History: 1 Age Score: 1 Gender Score: 0               Physical Exam:    Today's Vitals   04/07/24 0808 04/07/24 0821  BP: (!) 157/76   Pulse: 63   Resp: 20   Temp: 98.5 F (36.9 C)   TempSrc: Oral   SpO2: 95%   Weight: 96.2 kg   Height: 6\' 1"  (1.854 m)   PainSc:  0-No pain   Body mass index is 27.97 kg/m.  GEN: Well nourished, well developed in no acute distress NECK: No JVD CARDIAC: Normal rate, regular rhythm. RESPIRATORY:  Clear to auscultation without rales, wheezing or rhonchi  ABDOMEN: Soft, non-distended EXTREMITIES:  No edema; No deformity    ASSESSMENT AND PLAN:     # Paroxysmal atrial fibrillation, symptomatic: Confirmed on Zio monitor. # Typical atrial flutter status post ablation: - Discussed treatment options today for AF including antiarrhythmic drug therapy and ablation. Discussed risks, recovery and likelihood of success with each treatment strategy.  Patient had intolerance to Multaq  and is hesitant to take any antiarrhythmic drugs due to side effects.   Risk, benefits, and alternatives to EP study and ablation for afib were discussed. These risks include but are not limited to stroke, bleeding, vascular damage, tamponade, perforation, damage to the esophagus, lungs, phrenic nerve and other structures, pulmonary vein stenosis, worsening renal function, coronary vasospasm and death.  Discussed potential need for repeat ablation procedures and antiarrhythmic drugs after an initial ablation. The patient understands these risk and wishes to proceed.  We will therefore proceed with catheter ablation today. -Continue metoprolol  50 mg twice daily.   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 -Continue Eliquis  5mg  BID for at least 3 months.     Ardeen Kohler, MD, [facility], [facility] Cardiac Electrophysiology

## 2024-04-07 NOTE — Discharge Instructions (Signed)

## 2024-04-07 NOTE — Anesthesia Procedure Notes (Signed)
 Procedure Name: Intubation Date/Time: 04/07/2024 10:01 AM  Performed by: Merna Aase, CRNAPre-anesthesia Checklist: Patient identified, Patient being monitored, Timeout performed, Emergency Drugs available and Suction available Patient Re-evaluated:Patient Re-evaluated prior to induction Oxygen  Delivery Method: Circle system utilized Preoxygenation: Pre-oxygenation with 100% oxygen  Induction Type: IV induction Ventilation: Mask ventilation without difficulty Laryngoscope Size: Mac and 4 Grade View: Grade I Tube type: Oral Tube size: 7.0 mm Number of attempts: 1 Airway Equipment and Method: Stylet Placement Confirmation: ETT inserted through vocal cords under direct vision, positive ETCO2 and breath sounds checked- equal and bilateral Secured at: 23 cm Tube secured with: Tape Dental Injury: Teeth and Oropharynx as per pre-operative assessment

## 2024-04-08 ENCOUNTER — Telehealth (HOSPITAL_COMMUNITY): Payer: Self-pay

## 2024-04-08 MED FILL — Fentanyl Citrate Preservative Free (PF) Inj 100 MCG/2ML: INTRAMUSCULAR | Qty: 2 | Status: AC

## 2024-04-08 NOTE — Telephone Encounter (Signed)
 Spoke with patient to complete post procedure follow up call.  Patient reports no complications with groin sites.   Instructions reviewed with patient:  Remove large bandage at puncture site after 24 hours. It is normal to have bruising, tenderness and a pea or marble sized lump/knot at the groin site which can take up to three months to resolve.  Get help right away if you notice sudden swelling at the puncture site.  Check your puncture site every day for signs of infection: fever, redness, swelling, pus drainage, warmth, foul odor or excessive pain. If this occurs, please call the office at [phone], to speak with the nurse. Get help right away if your puncture site is bleeding and the bleeding does not stop after applying firm pressure to the area.  You may continue to have skipped beats/ atrial fibrillation during the first several months after your procedure.  It is very important not to miss any doses of your blood thinner Eliquis . Patient restarted taking this medication on 04/07/24.   You will follow up with the Afib clinic on 05/05/24 after your procedure and follow up with the APP on 07/05/24 after your procedure.   Patient verbalized understanding to all instructions provided.

## 2024-04-14 ENCOUNTER — Telehealth: Payer: Self-pay | Admitting: Cardiology

## 2024-04-14 NOTE — Telephone Encounter (Signed)
 Spoke with pt over the phone and he stated the cramping he is experiencing has occurred over the last few years but since the ablation, it has gotten significantly worse. Explained to pt that I am unsure if this is correlated to the procedure but that I would forward this to Dr. Orinda Birkenhead nurse to review as well. Pt verbalized understanding and had no further questions at this time.

## 2024-04-14 NOTE — Telephone Encounter (Signed)
 Pt states he's cramping in his legs, feet and hands after having surgery. Please advise

## 2024-04-14 NOTE — Telephone Encounter (Signed)
 Spoke with the patient who states that he has had more frequent cramping in his legs since the ablation. He reports that previously he had this occasionally but since the ablation it is happening almost daily. He states that he has also been getting more frequent cramping in his hands. He reports that he is staying hydrated. He reports that his legs have been colder than normal. He denies any pain, numbness, or tingling in lower extremities. He is closely follow by vascular surgery and has upcoming appointment with them along with ABIs on 6/3. Patient also reports that his incision sites look good. He reports some swelling on the left side that has improved. Does worsen when he is more active. He reports some tenderness and bruising.  Advised that I would make Dr. Daneil Dunker aware

## 2024-04-15 ENCOUNTER — Telehealth: Payer: Self-pay

## 2024-04-15 NOTE — Telephone Encounter (Signed)
 Dennis [name]: Large pectinated chicken wing. Max 33.7/ AVG 30.7/ Depth 20.4 Likely use a 35mm or 40mm device and possible TruSteer sheath Mid/Mid TSP RAO 7 CAU 2

## 2024-04-18 DIAGNOSIS — J449 Chronic obstructive pulmonary disease, unspecified: Secondary | ICD-10-CM | POA: Diagnosis not present

## 2024-04-21 ENCOUNTER — Encounter: Payer: Self-pay | Admitting: Emergency Medicine

## 2024-04-21 NOTE — Telephone Encounter (Signed)
 Spoke with the patient who states that cramping has improved. He still has some occurrences at night but overall less frequently. He denies any more coldness in his extremities. He reports his incision site is healing well. Overall he is feeling good with no complaints or concerns.

## 2024-04-26 ENCOUNTER — Ambulatory Visit (INDEPENDENT_AMBULATORY_CARE_PROVIDER_SITE_OTHER): Admitting: Vascular Surgery

## 2024-04-26 ENCOUNTER — Encounter

## 2024-04-26 ENCOUNTER — Ambulatory Visit

## 2024-04-26 ENCOUNTER — Encounter: Payer: Self-pay | Admitting: Vascular Surgery

## 2024-04-26 ENCOUNTER — Ambulatory Visit: Admitting: Vascular Surgery

## 2024-04-26 VITALS — BP 135/77 | HR 57 | Ht 73.0 in | Wt 212.0 lb

## 2024-04-26 DIAGNOSIS — I739 Peripheral vascular disease, unspecified: Secondary | ICD-10-CM

## 2024-04-26 DIAGNOSIS — I70219 Atherosclerosis of native arteries of extremities with intermittent claudication, unspecified extremity: Secondary | ICD-10-CM

## 2024-04-26 LAB — VAS US ABI WITH/WO TBI
Left ABI: 0.67
Right ABI: 0.8

## 2024-04-26 NOTE — Progress Notes (Signed)
 Patient name: Dennis [name] MRN: [id number] DOB: Aug 07, 1951 Sex: male  REASON FOR VISIT: 71-month follow-up PAD  HPI: Dennis [name] is a 73 y.o. male with history of atrial fibrillation, COPD, hypertension, hyperlipidemia that presents for 3 month follow-up PAD.  On 12/14/2023 he underwent right common femoral endarterectomy with profundoplasty and endarterectomy of the proximal SFA with bovine patch.  Groin healed without issue.  Right leg was much improved.  He has delayed intervention on his left leg.  Recently underwent ablation procedure about 2 and half weeks ago.  States since then has not been walking much.  States he was doing well prior to ablation and not having much trouble unless he walked up significant incline.  Past Medical History:  Diagnosis Date   A-fib [facility])    tx by Dr Manya Sells   Asthma    Atrial flutter [facility])    Diagnosed by ECG August 2015   Bilateral hearing loss 12/02/2018   no hearing aids   CKD (chronic kidney disease)    Colitis    COPD (chronic obstructive pulmonary disease) (HCC)    Coronary atherosclerosis of native coronary artery    Multivessel status post CABG   Essential hypertension    Hyperlipidemia    Insomnia    Peripheral vascular disease (HCC) 11/11/2023   PTSD (post-traumatic stress disorder)     Past Surgical History:  Procedure Laterality Date   ABDOMINAL AORTOGRAM W/LOWER EXTREMITY Bilateral 12/03/2023   Procedure: ABDOMINAL AORTOGRAM W/LOWER EXTREMITY;  Surgeon: Young Hensen, MD;  Location: [facility] INVASIVE CV LAB;  Service: Cardiovascular;  Laterality: Bilateral;   ATRIAL FIBRILLATION ABLATION N/A 04/07/2024   Procedure: ATRIAL FIBRILLATION ABLATION;  Surgeon: Ardeen Kohler, MD;  Location: [facility] INVASIVE CV LAB;  Service: Cardiovascular;  Laterality: N/A;   COLONOSCOPY  01/29/2005   Dr. Riley Cheadle; rectal polyp s/p polypectomy, otherwise normal.  Pathology with tubulovillous adenoma.   COLONOSCOPY  02/06/2008   Dr. Riley Cheadle; minimal  internal hemorrhoids, diminutive polyp in the splenic flexure s/p cold biopsy removal, otherwise normal.  Pathology with benign polypoid colonic mucosa.   COLONOSCOPY WITH PROPOFOL  N/A 08/30/2015   Procedure: COLONOSCOPY WITH PROPOFOL  at cecum at 0814; withdrawal time=8 minutes;  Surgeon: Suzette Espy, MD; internal hemorrhoids-likely source of hematochezia, otherwise normal exam.  Repeat in 5 years.   COLONOSCOPY WITH PROPOFOL  N/A 01/28/2021   Procedure: COLONOSCOPY WITH PROPOFOL ;  Surgeon: Suzette Espy, MD;  Location: [facility] ENDO [facility];  Service: Endoscopy;  Laterality: N/A;  am appt   CORONARY ARTERY BYPASS GRAFT  2005   Dr. Carloyn Chi Trigt: LIMA to LAD, right radial to circumflex, SVG to RCA   CORONARY ARTERY BYPASS GRAFT  10/16/2004   ELECTROPHYSIOLOGIC STUDY N/A 11/21/2016   Procedure: A-Flutter Ablation;  Surgeon: Tammie Fall, MD;  Location: [facility] INVASIVE CV LAB;  Service: Cardiovascular;  Laterality: N/A;   ENDARTERECTOMY FEMORAL Right 12/14/2023   Procedure: RIGHT ENDARTERECTOMY  COMMON FEMORAL PROFUNDOPLASTY WITH XENSURE BOVINE PATCH;  Surgeon: Young Hensen, MD;  Location: [facility] OR;  Service: Vascular;  Laterality: Right;   TEE WITHOUT CARDIOVERSION N/A 11/21/2016   Procedure: TRANSESOPHAGEAL ECHOCARDIOGRAM (TEE);  Surgeon: Luana Rumple, MD;  Location: [facility] ENDOSCOPY;  Service: Cardiovascular;  Laterality: N/A;    Family History  Problem Relation Age of Onset   Hypertension Mother    Alcohol abuse Mother    Alcohol abuse Father    Colon cancer Neg Hx     SOCIAL HISTORY: Social History   Tobacco Use  Smoking status: Former    Current packs/day: 0.00    Average packs/day: 2.0 packs/day for 27.0 years (54.0 ttl pk-yrs)    Types: Cigarettes    Start date: 02/01/1971    Quit date: 01/31/1998    Years since quitting: 26.2   Smokeless tobacco: Current    Types: Chew  Substance Use Topics   Alcohol use: Yes    Alcohol/week: 14.0 - 28.0 standard drinks of alcohol    Types: 14 - 28  Cans of beer per week    Comment: Drinks beer daily, typically 2-4 a day.    Allergies  Allergen Reactions   Ace Inhibitors Shortness Of Breath and Cough   Enalapril  Cough    Pt has been switched to Valsartan  and is tolerating the different class.   Other     Vein in Right Leg and Right Arm is missing due to a open heart surgery in 2005   Oxycodone  Itching   Dexamethasone  Itching    Pt was prescribed this in december   Alirocumab  Rash and Other (See Comments)    Blisters *Praluent    Clotrimazole -Betamethasone  Rash   Prednisone  Rash    Current Outpatient Medications  Medication Sig Dispense Refill   albuterol  (PROVENTIL ) (2.5 MG/3ML) 0.083% nebulizer solution Take 3 mLs (2.5 mg total) by nebulization every 6 (six) hours as needed for wheezing or shortness of breath. 360 mL 3   albuterol  (VENTOLIN  HFA) 108 (90 Base) MCG/ACT inhaler INHALE 2 PUFFS INTO THE LUNGS EVERY 4 HOURS AS NEEDED FOR WHEEZE 18 each 5   ALPRAZolam  (XANAX ) 0.5 MG tablet TAKE 1 TABLET BY MOUTH TWICE A DAY AS NEEDED FOR ANXIETY 30 tablet 2   amLODipine  (NORVASC ) 5 MG tablet TAKE 1 TABLET (5 MG TOTAL) BY MOUTH DAILY. 90 tablet 1   apixaban  (ELIQUIS ) 5 MG TABS tablet Take 1 tablet (5 mg total) by mouth 2 (two) times daily.     budesonide -formoterol  (SYMBICORT ) 160-4.5 MCG/ACT inhaler TAKE 2 PUFFS FIRST THING IN MORNING AND THEN ANOTHER 2 PUFFS ABOUT 12 HOURS LATER. 30.6 each 1   Cholecalciferol (VITAMIN D3) 125 MCG (5000 UT) CAPS Take 5,000 Units by mouth in the morning.     Evolocumab  (REPATHA  SURECLICK) 140 MG/ML SOAJ INJECT 140 MG INTO THE SKIN EVERY 14 (FOURTEEN) DAYS. 6 mL 1   ezetimibe -simvastatin  (VYTORIN ) 10-40 MG tablet TAKE 1 TABLET BY MOUTH EVERY DAY (Patient taking differently: Take 1 tablet by mouth every evening.) 90 tablet 1   fenofibrate  160 MG tablet TAKE 1 TABLET BY MOUTH EVERY DAY (Patient taking differently: Take 160 mg by mouth every evening.) 90 tablet 1   fish oil-omega-3 fatty acids 1000 MG  capsule Take 1 g by mouth at bedtime.     isosorbide  mononitrate (IMDUR ) 30 MG 24 hr tablet TAKE 1 TABLET BY MOUTH AT BEDTIME. 90 tablet 3   losartan -hydrochlorothiazide  (HYZAAR) 100-12.5 MG tablet TAKE 1 TABLET BY MOUTH EVERY DAY 90 tablet 1   metoprolol  tartrate (LOPRESSOR ) 50 MG tablet TAKE 1 TABLET BY MOUTH TWICE A DAY 180 tablet 1   naproxen sodium (ALEVE) 220 MG tablet Take 220 mg by mouth daily as needed (back pain.).     nitroGLYCERIN  (NITROSTAT ) 0.4 MG SL tablet PLACE 1 TABLET (0.4 MG TOTAL) UNDER THE TONGUE EVERY 5 (FIVE) MINUTES AS NEEDED. 25 tablet 3   Respiratory Therapy Supplies (NEBULIZER/TUBING/MOUTHPIECE) KIT Nebulizer tubing and mouthpiece 1 kit 0   traMADol  (ULTRAM ) 50 MG tablet Take 1 tablet (50 mg total) by mouth every  6 (six) hours as needed. 20 tablet 0   zolpidem  (AMBIEN ) 10 MG tablet Take 1 tablet (10 mg total) by mouth at bedtime as needed. for sleep (Patient taking differently: Take 10 mg by mouth at bedtime. for sleep) 30 tablet 3   No current facility-administered medications for this visit.    REVIEW OF SYSTEMS:  [X]  denotes positive finding, [ ]  denotes negative finding Cardiac  Comments:  Chest pain or chest pressure:    Shortness of breath upon exertion:    Short of breath when lying flat:    Irregular heart rhythm:        Vascular    Pain in calf, thigh, or hip brought on by ambulation:    Pain in feet at night that wakes you up from your sleep:     Blood clot in your veins:    Leg swelling:         Pulmonary    Oxygen  at home:    Productive cough:     Wheezing:         Neurologic    Sudden weakness in arms or legs:     Sudden numbness in arms or legs:     Sudden onset of difficulty speaking or slurred speech:    Temporary loss of vision in one eye:     Problems with dizziness:         Gastrointestinal    Blood in stool:     Vomited blood:         Genitourinary    Burning when urinating:     Blood in urine:        Psychiatric    Major  depression:         Hematologic    Bleeding problems:    Problems with blood clotting too easily:        Skin    Rashes or ulcers:        Constitutional    Fever or chills:      PHYSICAL EXAM: Vitals:   04/26/24 1243  BP: 135/77  Pulse: (!) 57  SpO2: 95%  Weight: 212 lb (96.2 kg)  Height: 6\' 1"  (1.854 m)    GENERAL: The patient is a well-nourished male, in no acute distress. The vital signs are documented above. CARDIAC: There is a regular rate and rhythm.  VASCULAR:  Right groin incision well-healed with palpable femoral pulse Weaker but palpable left femoral pulse No palpable pedal pulses  DATA:   ABIs today are 0.8 on the right monophasic (improved from 0.5 prior to surgery) and 0.67 on the left  Assessment/Plan:   72 y.o. male with history of atrial fibrillation, COPD, hypertension, hyperlipidemia that presents for 3 month follow-up PAD.  On 12/14/2023 he underwent right common femoral endarterectomy with profundoplasty and endarterectomy of the proximal SFA with bovine patch.    Discussed his ABIs on the right are improved from 0.5 prior to surgery to now 0.8.  He still has a great palpable right femoral pulse with a well-healed incision.  Ultimately we discussed left leg intervention at this time.  He does not really have any disabling symptoms that would warrant intervention and still recovering from his recent A-fib ablation procedure.  I will continue to follow him and we will take a conservative approach.  I will see him in 6 months with ABIs.  Discussed the importance of walking therapies and exercise.  Young Hensen, MD [facility] Office: [phone]  Young Hensen

## 2024-05-05 ENCOUNTER — Ambulatory Visit (HOSPITAL_COMMUNITY): Admitting: Internal Medicine

## 2024-05-05 ENCOUNTER — Ambulatory Visit (HOSPITAL_COMMUNITY)
Admission: RE | Admit: 2024-05-05 | Discharge: 2024-05-05 | Disposition: A | Discharge status: Home / Self Care | Source: Ambulatory Visit | Attending: Internal Medicine | Admitting: Internal Medicine

## 2024-05-05 ENCOUNTER — Encounter (HOSPITAL_COMMUNITY): Payer: Self-pay

## 2024-05-05 VITALS — BP 134/64 | HR 59 | Ht 73.0 in | Wt 215.8 lb

## 2024-05-05 DIAGNOSIS — I4891 Unspecified atrial fibrillation: Secondary | ICD-10-CM | POA: Diagnosis not present

## 2024-05-05 DIAGNOSIS — D6869 Other thrombophilia: Secondary | ICD-10-CM

## 2024-05-05 DIAGNOSIS — I480 Paroxysmal atrial fibrillation: Secondary | ICD-10-CM

## 2024-05-05 NOTE — Progress Notes (Incomplete)
 Primary Care Physician: Meldon Sport, MD Primary Cardiologist: Teddie Favre, MD Electrophysiologist: Ardeen Kohler, MD  {Click to update primary MD,subspecialty MD or APP then REFRESH:1}   Referring Physician: Dr. Daneil Dunker  {Removed [facility], PMH, PSH, ALLERGY, CMED, and SOC :1}   Dennis [name] is a 73 y.o. male with a history of HTN, HLD, COPD, CAD s/p CABG, atrial flutter ablation, PAD s/p right common femoral endarterectomy for claudication, and atrial fibrillation who presents for consultation in the [facility]. Patient is on Eliquis  5 mg BID for a CHADS2VASC score of 4.  On evaluation today, patient is currently in ***. S/p Afib ablation on 04/07/24 by Dr. Daneil Dunker. No episodes of Afib since ablation. No chest pain or SOB. Leg sites healed without issue. No missed doses of Eliquis  5 mg BID.   Today, he denies symptoms of orthopnea, PND, lower extremity edema, dizziness, presyncope, syncope, snoring, daytime somnolence, bleeding, or neurologic sequela. The patient is tolerating medications without difficulties and is otherwise without complaint today.    he has a BMI of There is no height or weight on file to calculate BMI.. There were no vitals filed for this visit.  Current Outpatient Medications  Medication Sig Dispense Refill   albuterol  (PROVENTIL ) (2.5 MG/3ML) 0.083% nebulizer solution Take 3 mLs (2.5 mg total) by nebulization every 6 (six) hours as needed for wheezing or shortness of breath. 360 mL 3   albuterol  (VENTOLIN  HFA) 108 (90 Base) MCG/ACT inhaler INHALE 2 PUFFS INTO THE LUNGS EVERY 4 HOURS AS NEEDED FOR WHEEZE 18 each 5   ALPRAZolam  (XANAX ) 0.5 MG tablet TAKE 1 TABLET BY MOUTH TWICE A DAY AS NEEDED FOR ANXIETY 30 tablet 2   amLODipine  (NORVASC ) 5 MG tablet TAKE 1 TABLET (5 MG TOTAL) BY MOUTH DAILY. 90 tablet 1   apixaban  (ELIQUIS ) 5 MG TABS tablet Take 1 tablet (5 mg total) by mouth 2 (two) times daily.     budesonide -formoterol   (SYMBICORT ) 160-4.5 MCG/ACT inhaler TAKE 2 PUFFS FIRST THING IN MORNING AND THEN ANOTHER 2 PUFFS ABOUT 12 HOURS LATER. 30.6 each 1   Cholecalciferol (VITAMIN D3) 125 MCG (5000 UT) CAPS Take 5,000 Units by mouth in the morning.     Evolocumab  (REPATHA  SURECLICK) 140 MG/ML SOAJ INJECT 140 MG INTO THE SKIN EVERY 14 (FOURTEEN) DAYS. 6 mL 1   ezetimibe -simvastatin  (VYTORIN ) 10-40 MG tablet TAKE 1 TABLET BY MOUTH EVERY DAY (Patient taking differently: Take 1 tablet by mouth every evening.) 90 tablet 1   fenofibrate  160 MG tablet TAKE 1 TABLET BY MOUTH EVERY DAY (Patient taking differently: Take 160 mg by mouth every evening.) 90 tablet 1   fish oil-omega-3 fatty acids 1000 MG capsule Take 1 g by mouth at bedtime.     isosorbide  mononitrate (IMDUR ) 30 MG 24 hr tablet TAKE 1 TABLET BY MOUTH AT BEDTIME. 90 tablet 3   losartan -hydrochlorothiazide  (HYZAAR) 100-12.5 MG tablet TAKE 1 TABLET BY MOUTH EVERY DAY 90 tablet 1   metoprolol  tartrate (LOPRESSOR ) 50 MG tablet TAKE 1 TABLET BY MOUTH TWICE A DAY 180 tablet 1   naproxen sodium (ALEVE) 220 MG tablet Take 220 mg by mouth daily as needed (back pain.).     nitroGLYCERIN  (NITROSTAT ) 0.4 MG SL tablet PLACE 1 TABLET (0.4 MG TOTAL) UNDER THE TONGUE EVERY 5 (FIVE) MINUTES AS NEEDED. 25 tablet 3   Respiratory Therapy Supplies (NEBULIZER/TUBING/MOUTHPIECE) KIT Nebulizer tubing and mouthpiece 1 kit 0   traMADol  (ULTRAM ) 50 MG tablet Take 1  tablet (50 mg total) by mouth every 6 (six) hours as needed. 20 tablet 0   zolpidem  (AMBIEN ) 10 MG tablet Take 1 tablet (10 mg total) by mouth at bedtime as needed. for sleep (Patient taking differently: Take 10 mg by mouth at bedtime. for sleep) 30 tablet 3   No current facility-administered medications for this visit.    Atrial Fibrillation Management history:  Previous antiarrhythmic drugs: none Previous cardioversions: none Previous ablations: atrial flutter 11/21/16, 04/07/24 Anticoagulation history: Eliquis    ROS- All  systems are reviewed and negative except as per the HPI above.  Physical Exam: There were no vitals taken for this visit.  GEN: Well nourished, well developed in no acute distress NECK: No JVD; No carotid bruits CARDIAC: {EPRHYTHM:[id number]}, no murmurs, rubs, gallops RESPIRATORY:  Clear to auscultation without rales, wheezing or rhonchi  ABDOMEN: Soft, non-tender, non-distended EXTREMITIES:  No edema; No deformity   EKG today demonstrates ***  Echo 09/19/22 demonstrated  1. Left ventricular ejection fraction, by estimation, is 55 to 60%. The  left ventricle has normal function. The left ventricle has no regional  wall motion abnormalities. There is mild left ventricular hypertrophy.  Left ventricular diastolic parameters  were normal.   2. Right ventricular systolic function is normal. The right ventricular  size is normal.   3. The mitral valve is normal in structure. No evidence of mitral valve  regurgitation. No evidence of mitral stenosis.   4. The aortic valve has an indeterminant number of cusps. There is mild  calcification of the aortic valve. There is mild thickening of the aortic  valve. Aortic valve regurgitation is not visualized. No aortic stenosis is  present.   5. Aortic dilatation noted. There is mild dilatation of the aortic root,  measuring 40 mm.   6. The inferior vena cava is normal in size with greater than 50%  respiratory variability, suggesting right atrial pressure of 3 mmHg.   ASSESSMENT & PLAN CHA2DS2-VASc Score = 4  The patient's score is based upon: CHF History: 1 HTN History: 1 Diabetes History: 0 Stroke History: 0 Vascular Disease History: 1 Age Score: 1 Gender Score: 0   {Confirm score is correct.  If not, click here to update score.  REFRESH note.  :1}    ASSESSMENT AND PLAN: {Select the correct AFib Diagnosis                 :[id number]}  S/p Afib ablation on 04/07/24 by Dr. Daneil Dunker  He is currently in ***. Continue Lopressor  50 mg  BID.    Follow up with EP as scheduled.   Minnie Amber, PA-C  Afib Clinic [facility] [address] [place], [place] [address] [phone]

## 2024-05-05 NOTE — Progress Notes (Signed)
 Primary Care Physician: Meldon Sport, MD Primary Cardiologist: Teddie Favre, MD Electrophysiologist: Ardeen Kohler, MD     Referring Physician: Dr. Jace Martinet is a 73 y.o. male with a history of HTN, HLD, COPD, CAD s/p CABG, atrial flutter ablation, PAD s/p right common femoral endarterectomy for claudication, and atrial fibrillation who presents for consultation in the [facility] Atrial Fibrillation [facility]. Patient is on Eliquis  5 mg BID for a CHADS2VASC score of 4.  On evaluation today, patient is currently in NSR. S/p Afib ablation on 04/07/24 by Dr. Daneil Dunker. No episodes of Afib since ablation. No chest pain or SOB. Leg sites healed without issue. He stopped Eliquis  about two weeks ago for a couple of days due to bleeding from hemorrhoid but it resolved. He has continued on Eliquis  5 mg BID since then.   Today, he denies symptoms of orthopnea, PND, lower extremity edema, dizziness, presyncope, syncope, snoring, daytime somnolence, bleeding, or neurologic sequela. The patient is tolerating medications without difficulties and is otherwise without complaint today.    he has a BMI of Body mass index is 28.47 kg/m.Dennis [name] Filed Weights   05/05/24 1152  Weight: 97.9 kg    Current Outpatient Medications  Medication Sig Dispense Refill   albuterol  (PROVENTIL ) (2.5 MG/3ML) 0.083% nebulizer solution Take 3 mLs (2.5 mg total) by nebulization every 6 (six) hours as needed for wheezing or shortness of breath. 360 mL 3   albuterol  (VENTOLIN  HFA) 108 (90 Base) MCG/ACT inhaler INHALE 2 PUFFS INTO THE LUNGS EVERY 4 HOURS AS NEEDED FOR WHEEZE (Patient taking differently: Inhale 2 puffs into the lungs as needed.) 18 each 5   ALPRAZolam  (XANAX ) 0.5 MG tablet TAKE 1 TABLET BY MOUTH TWICE A DAY AS NEEDED FOR ANXIETY 30 tablet 2   amLODipine  (NORVASC ) 5 MG tablet TAKE 1 TABLET (5 MG TOTAL) BY MOUTH DAILY. 90 tablet 1   apixaban  (ELIQUIS ) 5 MG TABS tablet Take 1 tablet (5 mg total) by  mouth 2 (two) times daily.     budesonide -formoterol  (SYMBICORT ) 160-4.5 MCG/ACT inhaler TAKE 2 PUFFS FIRST THING IN MORNING AND THEN ANOTHER 2 PUFFS ABOUT 12 HOURS LATER. (Patient taking differently: 2 puffs as needed.) 30.6 each 1   Cholecalciferol (VITAMIN D3) 125 MCG (5000 UT) CAPS Take 5,000 Units by mouth in the morning.     Evolocumab  (REPATHA  SURECLICK) 140 MG/ML SOAJ INJECT 140 MG INTO THE SKIN EVERY 14 (FOURTEEN) DAYS. 6 mL 1   ezetimibe -simvastatin  (VYTORIN ) 10-40 MG tablet TAKE 1 TABLET BY MOUTH EVERY DAY 90 tablet 1   fenofibrate  160 MG tablet TAKE 1 TABLET BY MOUTH EVERY DAY 90 tablet 1   fish oil-omega-3 fatty acids 1000 MG capsule Take 1 g by mouth at bedtime.     isosorbide  mononitrate (IMDUR ) 30 MG 24 hr tablet TAKE 1 TABLET BY MOUTH AT BEDTIME. 90 tablet 3   losartan -hydrochlorothiazide  (HYZAAR) 100-12.5 MG tablet TAKE 1 TABLET BY MOUTH EVERY DAY 90 tablet 1   metoprolol  tartrate (LOPRESSOR ) 50 MG tablet TAKE 1 TABLET BY MOUTH TWICE A DAY 180 tablet 1   naproxen sodium (ALEVE) 220 MG tablet Take 220 mg by mouth daily as needed (back pain.).     nitroGLYCERIN  (NITROSTAT ) 0.4 MG SL tablet PLACE 1 TABLET (0.4 MG TOTAL) UNDER THE TONGUE EVERY 5 (FIVE) MINUTES AS NEEDED. 25 tablet 3   Respiratory Therapy Supplies (NEBULIZER/TUBING/MOUTHPIECE) KIT Nebulizer tubing and mouthpiece 1 kit 0   traMADol  (ULTRAM ) 50 MG tablet  Take 1 tablet (50 mg total) by mouth every 6 (six) hours as needed. 20 tablet 0   zolpidem  (AMBIEN ) 10 MG tablet Take 1 tablet (10 mg total) by mouth at bedtime as needed. for sleep (Patient taking differently: Take 10 mg by mouth at bedtime. for sleep) 30 tablet 3   No current facility-administered medications for this encounter.    Atrial Fibrillation Management history:  Previous antiarrhythmic drugs: none Previous cardioversions: none Previous ablations: atrial flutter 11/21/16, 04/07/24 Anticoagulation history: Eliquis    ROS- All systems are reviewed and  negative except as per the HPI above.  Physical Exam: BP 134/64   Pulse (!) 59   Ht 6' 1 (1.854 m)   Wt 97.9 kg   BMI 28.47 kg/m   GEN: Well nourished, well developed in no acute distress NECK: No JVD; No carotid bruits CARDIAC: Regular rate and rhythm, no murmurs, rubs, gallops RESPIRATORY:  Clear to auscultation without rales, wheezing or rhonchi  ABDOMEN: Soft, non-tender, non-distended EXTREMITIES:  No edema; No deformity   EKG today demonstrates  Vent. rate 59 BPM PR interval 202 ms QRS duration 118 ms QT/QTcB 426/421 ms P-R-T axes 98 74 76 Sinus bradycardia Non-specific intra-ventricular conduction delay ST&T wave abnormality Abnormal ECG When compared with ECG of 07-Apr-2024 12:44, No significant change was found Confirmed by Minnie Amber ([id number]) on 05/05/2024 1:14:07 PM  Echo 09/19/22 demonstrated  1. Left ventricular ejection fraction, by estimation, is 55 to 60%. The  left ventricle has normal function. The left ventricle has no regional  wall motion abnormalities. There is mild left ventricular hypertrophy.  Left ventricular diastolic parameters  were normal.   2. Right ventricular systolic function is normal. The right ventricular  size is normal.   3. The mitral valve is normal in structure. No evidence of mitral valve  regurgitation. No evidence of mitral stenosis.   4. The aortic valve has an indeterminant number of cusps. There is mild  calcification of the aortic valve. There is mild thickening of the aortic  valve. Aortic valve regurgitation is not visualized. No aortic stenosis is  present.   5. Aortic dilatation noted. There is mild dilatation of the aortic root,  measuring 40 mm.   6. The inferior vena cava is normal in size with greater than 50%  respiratory variability, suggesting right atrial pressure of 3 mmHg.   ASSESSMENT & PLAN CHA2DS2-VASc Score = 4  The patient's score is based upon: CHF History: 1 HTN History: 1 Diabetes History:  0 Stroke History: 0 Vascular Disease History: 1 Age Score: 1 Gender Score: 0       ASSESSMENT AND PLAN: Paroxysmal Atrial Fibrillation (ICD10:  I48.0) The patient's CHA2DS2-VASc score is 4, indicating a 4.8% annual risk of stroke.   S/p Afib ablation on 04/07/24 by Dr. Daneil Dunker  He is currently in NSR. Continue Lopressor  50 mg BID.   Secondary Hypercoagulable State (ICD10:  D68.69) The patient is at significant risk for stroke/thromboembolism based upon his CHA2DS2-VASc Score of 4.  Continue Apixaban  (Eliquis ).  Continue Eliquis  5 mg BID without interruption.     Follow up with EP as scheduled.   Minnie Amber, PA-C  Afib Clinic [facility] [address] [place], [place] [address] [phone]

## 2024-05-09 ENCOUNTER — Telehealth: Payer: Self-pay | Admitting: Cardiology

## 2024-05-09 NOTE — Telephone Encounter (Signed)
 Spoke with the patient and advised on 90 day follow up post-ablation appointment. Patient is currently scheduled to see an APP but would prefer to see Dr. Daneil Dunker. Appointment has been rescheduled.

## 2024-05-09 NOTE — Telephone Encounter (Signed)
 Patient would like to speak with Dr. Orinda Birkenhead nurse to determine what 8/12 post-ablation, 90 day follow-up will entail.

## 2024-05-19 DIAGNOSIS — J449 Chronic obstructive pulmonary disease, unspecified: Secondary | ICD-10-CM | POA: Diagnosis not present

## 2024-05-24 ENCOUNTER — Telehealth: Payer: Self-pay | Admitting: Internal Medicine

## 2024-05-24 DIAGNOSIS — G4700 Insomnia, unspecified: Secondary | ICD-10-CM

## 2024-05-24 MED ORDER — ZOLPIDEM TARTRATE 10 MG PO TABS
10.0000 mg | ORAL_TABLET | Freq: Every evening | ORAL | 3 refills | Status: DC | PRN
Start: 2024-05-24 — End: 2024-06-14

## 2024-05-24 NOTE — Telephone Encounter (Signed)
 Copied from CRM [id number]. Topic: Clinical - Medication Refill >> May 24, 2024 10:06 AM Silvana PARAS wrote: Medication: zolpidem  (AMBIEN ) 10 MG tablet  Has the patient contacted their pharmacy? Yes (Agent: If no, request that the patient contact the pharmacy for the refill. If patient does not wish to contact the pharmacy document the reason why and proceed with request.) (Agent: If yes, when and what did the pharmacy advise?)  This is the patient's preferred pharmacy:  [id number] - [place], [place] - [address] AT [facility] [address] [place] [address] Phone: [phone] Fax: [phone]    Is this the correct pharmacy for this prescription? Yes If no, delete pharmacy and type the correct one.   Has the prescription been filled recently? Yes  Is the patient out of the medication? No  Has the patient been seen for an appointment in the last year OR does the patient have an upcoming appointment? Yes  Can we respond through MyChart? Yes  Agent: Please be advised that Rx refills may take up to 3 business days. We ask that you follow-up with your pharmacy.

## 2024-06-08 DIAGNOSIS — E782 Mixed hyperlipidemia: Secondary | ICD-10-CM | POA: Diagnosis not present

## 2024-06-08 DIAGNOSIS — I480 Paroxysmal atrial fibrillation: Secondary | ICD-10-CM | POA: Diagnosis not present

## 2024-06-08 DIAGNOSIS — N1832 Chronic kidney disease, stage 3b: Secondary | ICD-10-CM | POA: Diagnosis not present

## 2024-06-09 LAB — LIPID PANEL
Chol/HDL Ratio: 2.3 ratio (ref 0.0–5.0)
Cholesterol, Total: 117 mg/dL (ref 100–199)
HDL: 52 mg/dL (ref >39)
LDL Chol Calc (NIH): 48 mg/dL (ref 0–99)
Triglycerides: 85 mg/dL (ref 0–149)
VLDL Cholesterol Cal: 17 mg/dL (ref 5–40)

## 2024-06-09 LAB — CMP14+EGFR
ALT: 12 IU/L (ref 0–44)
AST: 19 IU/L (ref 0–40)
Albumin: 4.2 g/dL (ref 3.8–4.8)
Alkaline Phosphatase: 25 IU/L — ABNORMAL LOW (ref 44–121)
BUN/Creatinine Ratio: 12 (ref 10–24)
BUN: 23 mg/dL (ref 8–27)
Bilirubin Total: 0.5 mg/dL (ref 0.0–1.2)
CO2: 27 mmol/L (ref 20–29)
Calcium: 9.8 mg/dL (ref 8.6–10.2)
Chloride: 95 mmol/L — ABNORMAL LOW (ref 96–106)
Creatinine, Ser: 1.85 mg/dL — ABNORMAL HIGH (ref 0.76–1.27)
Globulin, Total: 2 g/dL (ref 1.5–4.5)
Glucose: 101 mg/dL — ABNORMAL HIGH (ref 70–99)
Potassium: 4.7 mmol/L (ref 3.5–5.2)
Sodium: 135 mmol/L (ref 134–144)
Total Protein: 6.2 g/dL (ref 6.0–8.5)
eGFR: 38 mL/min/1.73 — ABNORMAL LOW (ref >59)

## 2024-06-09 LAB — CBC
Hematocrit: 41.2 % (ref 37.5–51.0)
Hemoglobin: 13 g/dL (ref 13.0–17.7)
MCH: 29.5 pg (ref 26.6–33.0)
MCHC: 31.6 g/dL (ref 31.5–35.7)
MCV: 93 fL (ref 79–97)
Platelets: 157 x10E3/uL (ref 150–450)
RBC: 4.41 x10E6/uL (ref 4.14–5.80)
RDW: 13.2 % (ref 11.6–15.4)
WBC: 8.3 x10E3/uL (ref 3.4–10.8)

## 2024-06-09 LAB — TSH+FREE T4
Free T4: 1.07 ng/dL (ref 0.82–1.77)
TSH: 4.16 u[IU]/mL (ref 0.450–4.500)

## 2024-06-10 ENCOUNTER — Telehealth: Payer: Self-pay

## 2024-06-10 NOTE — Telephone Encounter (Signed)
 Copied from CRM [id number]. Topic: Clinical - Medication Question >> Jun 10, 2024  1:55 PM Delon DASEN wrote: Reason for CRM: Need Penicillin called in for dental work coming up on 7/23 - [phone]

## 2024-06-13 ENCOUNTER — Ambulatory Visit: Admitting: Internal Medicine

## 2024-06-13 ENCOUNTER — Other Ambulatory Visit: Payer: Self-pay | Admitting: Internal Medicine

## 2024-06-13 DIAGNOSIS — Z792 Long term (current) use of antibiotics: Secondary | ICD-10-CM

## 2024-06-13 MED ORDER — AMOXICILLIN 500 MG PO CAPS: 2000.0000 mg | ORAL_CAPSULE | Freq: Once | ORAL | 0 refills | Status: AC

## 2024-06-13 NOTE — Telephone Encounter (Signed)
 Left detailed vm

## 2024-06-14 ENCOUNTER — Encounter: Payer: Self-pay | Admitting: Internal Medicine

## 2024-06-14 ENCOUNTER — Ambulatory Visit (INDEPENDENT_AMBULATORY_CARE_PROVIDER_SITE_OTHER): Admitting: Internal Medicine

## 2024-06-14 VITALS — BP 136/82 | HR 76 | Ht 73.0 in | Wt 216.8 lb

## 2024-06-14 DIAGNOSIS — R7303 Prediabetes: Secondary | ICD-10-CM

## 2024-06-14 DIAGNOSIS — R21 Rash and other nonspecific skin eruption: Secondary | ICD-10-CM

## 2024-06-14 DIAGNOSIS — I739 Peripheral vascular disease, unspecified: Secondary | ICD-10-CM | POA: Diagnosis not present

## 2024-06-14 DIAGNOSIS — F419 Anxiety disorder, unspecified: Secondary | ICD-10-CM | POA: Diagnosis not present

## 2024-06-14 DIAGNOSIS — E782 Mixed hyperlipidemia: Secondary | ICD-10-CM | POA: Diagnosis not present

## 2024-06-14 DIAGNOSIS — I10 Essential (primary) hypertension: Secondary | ICD-10-CM

## 2024-06-14 DIAGNOSIS — N401 Enlarged prostate with lower urinary tract symptoms: Secondary | ICD-10-CM | POA: Diagnosis not present

## 2024-06-14 DIAGNOSIS — G4700 Insomnia, unspecified: Secondary | ICD-10-CM

## 2024-06-14 DIAGNOSIS — Z0001 Encounter for general adult medical examination with abnormal findings: Secondary | ICD-10-CM

## 2024-06-14 DIAGNOSIS — R350 Frequency of micturition: Secondary | ICD-10-CM | POA: Diagnosis not present

## 2024-06-14 MED ORDER — ALPRAZOLAM 0.5 MG PO TABS: 0.5000 mg | ORAL_TABLET | Freq: Two times a day (BID) | ORAL | 3 refills | Status: DC | PRN

## 2024-06-14 MED ORDER — ZOLPIDEM TARTRATE 10 MG PO TABS: 10.0000 mg | ORAL_TABLET | Freq: Every evening | ORAL | 3 refills | Status: DC | PRN

## 2024-06-14 NOTE — Patient Instructions (Signed)
Please continue to take medications as prescribed.  Please continue to follow low carb diet and perform moderate exercise/walking as tolerated.  Please get fasting blood tests done before the next visit.

## 2024-06-14 NOTE — Progress Notes (Unsigned)
 Established Patient Office Visit  Subjective:  Patient ID: Dennis [name], male    DOB: 14-Dec-1950  Age: 73 y.o. MRN: [id number]  CC:  Chief Complaint  Patient presents with   Medical Management of Chronic Issues    4 month f/u .     HPI Dennis [name] is a 73 y.o. male with past medical history of CAD s/p CABG, HTN, HLD, COPD, anxiety and insomnia who presents for f/u of his chronic medical conditions.  HTN: BP is well-controlled. He takes losartan /hydrochlorothiazide  100-12.5 mg QD and Imdur  30 mg QD currently.  Patient denies headache, dizziness or palpitations.  Atrial fibrillation: He has intermittent dyspnea and palpitations. He has had cardiac ablation done in the past.  He stopped taking metoprolol  5 days ago as his HR was dropping to 50s and he thought it was causing hypoxia. He was evaluated by Dr. Waddell and has started taking Eliquis  5 mg once daily.  He could not tolerate BID dose, had rectal bleeding, has history of GI bleeding with AC and aspirin . He is going to see Dr. Kennyth for consideration of Watchman device. He has oxygen  desaturations especially at nighttime up to 80s.  O2 sat on room air: 93% O2 sat on ambulation: 86% O2 sat on ambulation with 2 LMP O2: 93%  He was given oxygen  supplies prescription in the last visit, but could not get it.  CAD and PAD: He is placed on Imdur  for episodes of chest pain, which has improved his symptoms. He had right, femoral endarterectomy with profundoplasty and and arterectomy of the proximal SFA with bovine patch on 12/14/23. He was seen by vascular surgeon in the outpatient setting after the procedure as well.  HLD: He has started taking Vytorin . He is on Repatha  as well. His LDL is improved to 24 now.  CKD: His BMP showed stable GFR at 38, improved from 26. His dose of HCTZ was reduced in the last visit. Denies any dysuria, hematuria or urinary hesitancy or resistance.  He is trying to improve fluid  intake.  Anxiety/insomnia/PTSD: Currently well controlled with Xanax .  He gets intermittent episodes of severe anxiety spells, where he gets flushed and has mild dyspnea, but they have been less frequent recently.  He also takes Ambien  for insomnia.  COPD: Has intermittent dyspnea and hypoxia spells at home, could be related to COPD as well.  He uses Symbicort  twice daily and albuterol  inhaler/nebulizer as needed for dyspnea or wheezing.   Past Medical History:  Diagnosis Date   A-fib [facility])    tx by Dr Danelle Waddell   Asthma    Atrial flutter [facility])    Diagnosed by ECG August 2015   Bilateral hearing loss 12/02/2018   no hearing aids   CKD (chronic kidney disease)    Colitis    COPD (chronic obstructive pulmonary disease) (HCC)    Coronary atherosclerosis of native coronary artery    Multivessel status post CABG   Essential hypertension    Hyperlipidemia    Insomnia    Peripheral vascular disease (HCC) 11/11/2023   PTSD (post-traumatic stress disorder)     Past Surgical History:  Procedure Laterality Date   ABDOMINAL AORTOGRAM W/LOWER EXTREMITY Bilateral 12/03/2023   Procedure: ABDOMINAL AORTOGRAM W/LOWER EXTREMITY;  Surgeon: Gretta Lonni PARAS, MD;  Location: [facility] INVASIVE CV LAB;  Service: Cardiovascular;  Laterality: Bilateral;   ATRIAL FIBRILLATION ABLATION N/A 04/07/2024   Procedure: ATRIAL FIBRILLATION ABLATION;  Surgeon: Kennyth Chew, MD;  Location: [facility] INVASIVE CV  LAB;  Service: Cardiovascular;  Laterality: N/A;   COLONOSCOPY  01/29/2005   Dr. Shaaron; rectal polyp s/p polypectomy, otherwise normal.  Pathology with tubulovillous adenoma.   COLONOSCOPY  02/06/2008   Dr. Shaaron; minimal internal hemorrhoids, diminutive polyp in the splenic flexure s/p cold biopsy removal, otherwise normal.  Pathology with benign polypoid colonic mucosa.   COLONOSCOPY WITH PROPOFOL  N/A 08/30/2015   Procedure: COLONOSCOPY WITH PROPOFOL  at cecum at 0814; withdrawal time=8 minutes;  Surgeon: Lamar CHRISTELLA Shaaron, MD; internal hemorrhoids-likely source of hematochezia, otherwise normal exam.  Repeat in 5 years.   COLONOSCOPY WITH PROPOFOL  N/A 01/28/2021   Procedure: COLONOSCOPY WITH PROPOFOL ;  Surgeon: Shaaron Lamar CHRISTELLA, MD;  Location: [facility] ENDO SUITE;  Service: Endoscopy;  Laterality: N/A;  am appt   CORONARY ARTERY BYPASS GRAFT  2005   Dr. Fleeta Trigt: LIMA to LAD, right radial to circumflex, SVG to RCA   CORONARY ARTERY BYPASS GRAFT  10/16/2004   ELECTROPHYSIOLOGIC STUDY N/A 11/21/2016   Procedure: A-Flutter Ablation;  Surgeon: Danelle LELON Birmingham, MD;  Location: [facility] INVASIVE CV LAB;  Service: Cardiovascular;  Laterality: N/A;   ENDARTERECTOMY FEMORAL Right 12/14/2023   Procedure: RIGHT ENDARTERECTOMY  COMMON FEMORAL PROFUNDOPLASTY WITH XENSURE BOVINE PATCH;  Surgeon: Gretta Lonni PARAS, MD;  Location: [facility] OR;  Service: Vascular;  Laterality: Right;   TEE WITHOUT CARDIOVERSION N/A 11/21/2016   Procedure: TRANSESOPHAGEAL ECHOCARDIOGRAM (TEE);  Surgeon: Jerel Balding, MD;  Location: [facility] ENDOSCOPY;  Service: Cardiovascular;  Laterality: N/A;    Family History  Problem Relation Age of Onset   Hypertension Mother    Alcohol abuse Mother    Alcohol abuse Father    Colon cancer Neg Hx     Social History   Socioeconomic History   Marital status: Married    Spouse name: Not on file   Number of children: 3   Years of education: Not on file   Highest education level: Not on file  Occupational History   Not on file  Tobacco Use   Smoking status: Former    Current packs/day: 0.00    Average packs/day: 2.0 packs/day for 27.0 years (54.0 ttl pk-yrs)    Types: Cigarettes    Start date: 02/01/1971    Quit date: 01/31/1998    Years since quitting: 26.3   Smokeless tobacco: Current    Types: Chew  Vaping Use   Vaping status: Never Used  Substance and Sexual Activity   Alcohol use: Yes    Alcohol/week: 14.0 - 28.0 standard drinks of alcohol    Types: 14 - 28 Cans of beer per week    Comment: Drinks beer  daily, typically 2-4 a day.   Drug use: No   Sexual activity: Yes  Other Topics Concern   Not on file  Social History Narrative   Lives with wife, Delon   Social Drivers of Health   Financial Resource Strain: Medium Risk (08/05/2021)   Overall Financial Resource Strain (CARDIA)    Difficulty of Paying Living Expenses: Somewhat hard  Food Insecurity: No Food Insecurity (12/16/2023)   Hunger Vital Sign    Worried About Running Out of Food in the Last Year: Never true    Ran Out of Food in the Last Year: Never true  Transportation Needs: No Transportation Needs (12/16/2023)   PRAPARE - Administrator, Civil Service (Medical): No    Lack of Transportation (Non-Medical): No  Physical Activity: Insufficiently Active (08/05/2021)   Exercise Vital Sign    Days of  Exercise per Week: 3 days    Minutes of Exercise per Session: 30 min  Stress: No Stress Concern Present (08/05/2021)   [employer] of Occupational Health - Occupational Stress Questionnaire    Feeling of Stress : Only a little  Social Connections: Unknown (12/14/2023)   Social Connection and Isolation Panel    Frequency of Communication with Friends and Family: More than three times a week    Frequency of Social Gatherings with Friends and Family: More than three times a week    Attends Religious Services: Not on file    Active Member of Clubs or Organizations: Not on file    Attends Banker Meetings: Not on file    Marital Status: Not on file  Intimate Partner Violence: Not At Risk (12/16/2023)   Humiliation, Afraid, Rape, and Kick questionnaire    Fear of Current or Ex-Partner: No    Emotionally Abused: No    Physically Abused: No    Sexually Abused: No    Outpatient Medications Prior to Visit  Medication Sig Dispense Refill   albuterol  (PROVENTIL ) (2.5 MG/3ML) 0.083% nebulizer solution Take 3 mLs (2.5 mg total) by nebulization every 6 (six) hours as needed for wheezing or shortness of  breath. 360 mL 3   albuterol  (VENTOLIN  HFA) 108 (90 Base) MCG/ACT inhaler INHALE 2 PUFFS INTO THE LUNGS EVERY 4 HOURS AS NEEDED FOR WHEEZE (Patient taking differently: Inhale 2 puffs into the lungs as needed.) 18 each 5   ALPRAZolam  (XANAX ) 0.5 MG tablet TAKE 1 TABLET BY MOUTH TWICE A DAY AS NEEDED FOR ANXIETY 30 tablet 2   amLODipine  (NORVASC ) 5 MG tablet TAKE 1 TABLET (5 MG TOTAL) BY MOUTH DAILY. 90 tablet 1   apixaban  (ELIQUIS ) 5 MG TABS tablet Take 1 tablet (5 mg total) by mouth 2 (two) times daily.     budesonide -formoterol  (SYMBICORT ) 160-4.5 MCG/ACT inhaler TAKE 2 PUFFS FIRST THING IN MORNING AND THEN ANOTHER 2 PUFFS ABOUT 12 HOURS LATER. (Patient taking differently: 2 puffs as needed.) 30.6 each 1   Cholecalciferol (VITAMIN D3) 125 MCG (5000 UT) CAPS Take 5,000 Units by mouth in the morning.     Evolocumab  (REPATHA  SURECLICK) 140 MG/ML SOAJ INJECT 140 MG INTO THE SKIN EVERY 14 (FOURTEEN) DAYS. 6 mL 1   ezetimibe -simvastatin  (VYTORIN ) 10-40 MG tablet TAKE 1 TABLET BY MOUTH EVERY DAY 90 tablet 1   fenofibrate  160 MG tablet TAKE 1 TABLET BY MOUTH EVERY DAY 90 tablet 1   fish oil-omega-3 fatty acids 1000 MG capsule Take 1 g by mouth at bedtime.     isosorbide  mononitrate (IMDUR ) 30 MG 24 hr tablet TAKE 1 TABLET BY MOUTH AT BEDTIME. 90 tablet 3   losartan -hydrochlorothiazide  (HYZAAR) 100-12.5 MG tablet TAKE 1 TABLET BY MOUTH EVERY DAY 90 tablet 1   metoprolol  tartrate (LOPRESSOR ) 50 MG tablet TAKE 1 TABLET BY MOUTH TWICE A DAY 180 tablet 1   naproxen sodium (ALEVE) 220 MG tablet Take 220 mg by mouth daily as needed (back pain.).     nitroGLYCERIN  (NITROSTAT ) 0.4 MG SL tablet PLACE 1 TABLET (0.4 MG TOTAL) UNDER THE TONGUE EVERY 5 (FIVE) MINUTES AS NEEDED. 25 tablet 3   Respiratory Therapy Supplies (NEBULIZER/TUBING/MOUTHPIECE) KIT Nebulizer tubing and mouthpiece 1 kit 0   traMADol  (ULTRAM ) 50 MG tablet Take 1 tablet (50 mg total) by mouth every 6 (six) hours as needed. 20 tablet 0   zolpidem   (AMBIEN ) 10 MG tablet Take 1 tablet (10 mg total) by mouth at bedtime  as needed. for sleep 30 tablet 3   No facility-administered medications prior to visit.    Allergies  Allergen Reactions   Ace Inhibitors Shortness Of Breath and Cough   Enalapril  Cough    Pt has been switched to Valsartan  and is tolerating the different class.   Other     Vein in Right Leg and Right Arm is missing due to a open heart surgery in 2005   Oxycodone  Itching   Dexamethasone  Itching    Pt was prescribed this in december   Alirocumab  Rash and Other (See Comments)    Blisters *Praluent    Clotrimazole -Betamethasone  Rash   Prednisone  Rash    ROS Review of Systems  Constitutional:  Negative for chills and fever.  HENT:  Negative for congestion and sore throat.   Eyes:  Negative for pain and discharge.  Respiratory:  Positive for shortness of breath. Negative for cough.   Cardiovascular:  Positive for palpitations. Negative for chest pain.  Gastrointestinal:  Negative for diarrhea, nausea and vomiting.  Endocrine: Negative for polydipsia and polyuria.  Genitourinary:  Negative for dysuria, flank pain and hematuria.       Nocturia  Musculoskeletal:  Negative for neck pain and neck stiffness.       Leg pain  Skin:  Positive for rash.       Skin tag in axillae  Neurological:  Negative for dizziness and weakness.  Psychiatric/Behavioral:  Positive for sleep disturbance. Negative for agitation, behavioral problems, dysphoric mood and suicidal ideas. The patient is nervous/anxious.       Objective:    Physical Exam Vitals reviewed.  Constitutional:      General: He is not in acute distress.    Appearance: He is not diaphoretic.  HENT:     Head: Normocephalic and atraumatic.     Nose: Nose normal.     Mouth/Throat:     Mouth: Mucous membranes are moist.  Eyes:     General: No scleral icterus.    Extraocular Movements: Extraocular movements intact.  Cardiovascular:     Rate and Rhythm: Normal  rate and regular rhythm.     Heart sounds: Normal heart sounds. No murmur heard. Pulmonary:     Breath sounds: Normal breath sounds. No wheezing or rales.  Musculoskeletal:     Cervical back: Neck supple. No tenderness.     Right lower leg: No edema.     Left lower leg: No edema.  Skin:    General: Skin is warm.     Findings: No rash.     Comments: Skin tags in bilateral axillae  Neurological:     General: No focal deficit present.     Mental Status: He is alert and oriented to person, place, and time.     Motor: Weakness (Bilateral LE-4/5) present.  Psychiatric:        Mood and Affect: Mood normal.        Behavior: Behavior normal.     BP 136/82 (BP Location: Left Arm)   Pulse 76   Ht 6' 1 (1.854 m)   Wt 216 lb 12.8 oz (98.3 kg)   SpO2 93%   BMI 28.60 kg/m  Wt Readings from Last 3 Encounters:  06/14/24 216 lb 12.8 oz (98.3 kg)  05/05/24 215 lb 12.8 oz (97.9 kg)  04/26/24 212 lb (96.2 kg)    Lab Results  Component Value Date   TSH 4.160 06/08/2024   Lab Results  Component Value Date   WBC 8.3 06/08/2024  HGB 13.0 06/08/2024   HCT 41.2 06/08/2024   MCV 93 06/08/2024   PLT 157 06/08/2024   Lab Results  Component Value Date   NA 135 06/08/2024   K 4.7 06/08/2024   CO2 27 06/08/2024   GLUCOSE 101 (H) 06/08/2024   BUN 23 06/08/2024   CREATININE 1.85 (H) 06/08/2024   BILITOT 0.5 06/08/2024   ALKPHOS 25 (L) 06/08/2024   AST 19 06/08/2024   ALT 12 06/08/2024   PROT 6.2 06/08/2024   ALBUMIN  4.2 06/08/2024   CALCIUM  9.8 06/08/2024   ANIONGAP 9 12/15/2023   EGFR 38 (L) 06/08/2024   GFR 60.41 08/13/2015   Lab Results  Component Value Date   CHOL 117 06/08/2024   Lab Results  Component Value Date   HDL 52 06/08/2024   Lab Results  Component Value Date   LDLCALC 48 06/08/2024   Lab Results  Component Value Date   TRIG 85 06/08/2024   Lab Results  Component Value Date   CHOLHDL 2.3 06/08/2024   Lab Results  Component Value Date   HGBA1C 5.9  (H) 06/25/2023      Assessment & Plan:   Problem List Items Addressed This Visit   None      No orders of the defined types were placed in this encounter.   Follow-up: No follow-ups on file.    Suzzane MARLA Blanch, MD

## 2024-06-14 NOTE — Assessment & Plan Note (Signed)
 BP Readings from Last 1 Encounters:  06/14/24 136/82   Well-controlled with Hyzaar, amlodipine , metoprolol  and Imdur  Counseled for compliance with the medications Advised DASH diet and moderate exercise/walking, at least 150 mins/week

## 2024-06-14 NOTE — Assessment & Plan Note (Signed)
 Followed by Vascular surgery - On 12/14/2023 he underwent right common femoral endarterectomy with profundoplasty and endarterectomy of the proximal SFA with bovine patch.  Had leg claudication symptoms, improved now Was on aspirin  in the past, but had recurrent GI bleeding with it - continue QOD for now On Eliquis  for A Fib On Repatha 

## 2024-06-15 DIAGNOSIS — Z0001 Encounter for general adult medical examination with abnormal findings: Secondary | ICD-10-CM | POA: Insufficient documentation

## 2024-06-15 NOTE — Assessment & Plan Note (Signed)
 Used to follow up with lipid clinic Was on Praluent, had severe fatigue and rash with it -could not tolerate it Checked lipid profile - LDL at goal now, on Repatha and Vytorin now

## 2024-06-15 NOTE — Assessment & Plan Note (Addendum)
Physical exam as documented. Fasting blood tests reviewed today. Advised to get Shingrix and Tdap vaccines at local pharmacy.

## 2024-06-15 NOTE — Assessment & Plan Note (Signed)
Lab Results  Component Value Date   HGBA1C 5.9 (H) 06/25/2023   Advised to continue to follow low carb diet

## 2024-06-15 NOTE — Assessment & Plan Note (Signed)
Takes Xanax PRN, for panic episodes Associated with previous incident with his wife (cardiac arrest) Symptoms better now Takes Ambien 10 mg QD for insomnia

## 2024-06-15 NOTE — Assessment & Plan Note (Signed)
 Has nocturia, urinary hesitancy and incomplete bladder emptying likely due to BPH Followed by urology

## 2024-06-15 NOTE — Assessment & Plan Note (Signed)
 Concern for candidal infection, advised to use Nystatin  powder Has ketoconazole  cream

## 2024-06-15 NOTE — Assessment & Plan Note (Signed)
Takes Ambien as needed 

## 2024-06-18 DIAGNOSIS — J449 Chronic obstructive pulmonary disease, unspecified: Secondary | ICD-10-CM | POA: Diagnosis not present

## 2024-06-21 ENCOUNTER — Telehealth: Payer: Self-pay

## 2024-06-21 NOTE — Telephone Encounter (Signed)
   Pre-operative Risk Assessment    Patient Name: Dennis [name]  DOB: [birth year] MRN: [id number]   Date of last office visit: 02/22/24 [name], MD Date of next office visit: 07/05/24 [name], MD  Request for Surgical Clearance    Procedure:  Dental Extraction - Amount of Teeth to be Pulled:  1 TOOTH  Date of Surgery:  Clearance TBD                                Surgeon:  DR MYRA, DDS Surgeon's Group or Practice Name:  [facility] Phone number:  [phone] Fax number:  [phone]   Type of Clearance Requested:   - Medical  - Pharmacy:  Hold Apixaban  (Eliquis )     Type of Anesthesia:  Local    Additional requests/questions:    Signed, [name]   06/21/2024, 12:12 PM  '

## 2024-06-22 NOTE — Telephone Encounter (Signed)
   Patient Name: Dennis [name]  DOB: November 14, 1951 MRN: [id number]  Primary Cardiologist: [name], MD  Chart reviewed as part of pre-operative protocol coverage.   Simple dental extractions (i.e. 1-2 teeth) are considered low risk procedures per guidelines and generally do not require any specific cardiac clearance. It is also generally accepted that for simple extractions and dental cleanings, there is no need to interrupt blood thinner therapy.   SBE prophylaxis is not required for the patient from a cardiac standpoint.  I will route this recommendation to the requesting party via Epic fax function and remove from pre-op pool.  Please call with questions.  Lamarr Satterfield, NP 06/22/2024, 12:51 PM

## 2024-06-26 ENCOUNTER — Other Ambulatory Visit: Payer: Self-pay | Admitting: Internal Medicine

## 2024-06-26 DIAGNOSIS — G4700 Insomnia, unspecified: Secondary | ICD-10-CM

## 2024-06-27 ENCOUNTER — Other Ambulatory Visit: Payer: Self-pay | Admitting: Internal Medicine

## 2024-06-27 ENCOUNTER — Telehealth: Payer: Self-pay

## 2024-06-27 DIAGNOSIS — G4700 Insomnia, unspecified: Secondary | ICD-10-CM

## 2024-06-27 MED ORDER — ZOLPIDEM TARTRATE 10 MG PO TABS: 10.0000 mg | ORAL_TABLET | Freq: Every evening | ORAL | 3 refills | Status: DC | PRN

## 2024-06-27 NOTE — Telephone Encounter (Signed)
 Copied from CRM [id number]. Topic: Clinical - Prescription Issue >> Jun 27, 2024 10:02 AM Carlyon D wrote: Reason for CRM:  zolpidem  (AMBIEN ) 10 MG tablet  Pt is calling stated he sent in a medication refill July 1st and pharmacy told him provider was not prescribing this anymore pt believes this is an error as he's been taking this medication for a while. Pt would like this issue fixed and he would like some one to reach out to him in regards to this issue pt would like some one to call his home phone. Pt is stepping out he states its ok to leave a VM in regards to this issue.

## 2024-06-28 NOTE — Telephone Encounter (Signed)
 Patient advised.

## 2024-06-29 ENCOUNTER — Other Ambulatory Visit: Payer: Self-pay | Admitting: Internal Medicine

## 2024-06-29 DIAGNOSIS — I25118 Atherosclerotic heart disease of native coronary artery with other forms of angina pectoris: Secondary | ICD-10-CM

## 2024-06-29 DIAGNOSIS — I739 Peripheral vascular disease, unspecified: Secondary | ICD-10-CM

## 2024-06-29 DIAGNOSIS — J42 Unspecified chronic bronchitis: Secondary | ICD-10-CM

## 2024-07-04 ENCOUNTER — Other Ambulatory Visit: Payer: Self-pay | Admitting: Internal Medicine

## 2024-07-04 ENCOUNTER — Telehealth: Payer: Self-pay

## 2024-07-04 DIAGNOSIS — Z792 Long term (current) use of antibiotics: Secondary | ICD-10-CM

## 2024-07-04 MED ORDER — AMOXICILLIN 500 MG PO CAPS: ORAL_CAPSULE | ORAL | 2 refills | Status: DC

## 2024-07-04 NOTE — Telephone Encounter (Signed)
 Copied from CRM #[id number]. Topic: Clinical - Medication Question >> Jul 04, 2024  9:38 AM Chiquita SQUIBB wrote: Reason for CRM: Patient is calling in stating that he is going to be having more dental work done and will need prescription for what the doctor prescribed the patient for his last dental  work.

## 2024-07-05 ENCOUNTER — Ambulatory Visit: Admitting: Physician Assistant

## 2024-07-05 ENCOUNTER — Ambulatory Visit: Admitting: Cardiology

## 2024-07-05 NOTE — Telephone Encounter (Signed)
Pt informed

## 2024-07-11 NOTE — Progress Notes (Unsigned)
 Electrophysiology Office Note:   Date:  07/13/2024  ID:  Dennis [name], DOB 09-Aug-1951, MRN [id number]  Primary Cardiologist: [name], MD Electrophysiologist: [name], MD      History of Present Illness:   Dennis [name] is a 73 y.o. male with h/o hypertension, hyperlipidemia, COPD, CAD s/p CABG, atrial flutter s/p ablation, paroxysmal atrial fibrillation, PAD s/p right common femoral endarterectomy for claudication who is being seen today for follow up after catheter ablation.   Discussed the use of AI scribe software for clinical note transcription with the patient, who gave verbal consent to proceed.  History of Present Illness Dennis [name] is a 73 year old male with atrial fibrillation who presents for a three-month follow-up after an ablation procedure.  Ablation done 04/07/2024.  He has experienced significant improvement in symptoms following the recent ablation procedure, with no severe episodes of feeling like he was 'dying' due to heart rhythm issues. However, he mentions a few minor episodes where he felt his heart was stopping or missing a beat, which were brief and followed by high blood pressure and breathing difficulties. He has had about three or four such episodes since the procedure.  He has a history of high blood pressure and is currently taking a blood pressure pill every morning. His blood pressure readings have been inconsistent, with higher readings at night, such as 155/81 or 82, and lower readings during the day, around 130/70 or 75. There was a change in his medication from a single pill with two components to two separate pills due to a supply issue, which was later resolved.  He has a history of clogged arteries, having had the right leg artery cleaned out in January and the left one being 95% blocked, which is yet to be addressed. He experiences a significant difference in blood pressure readings when sitting versus standing, with lower  readings upon standing, which he attributes to his circulation issues.  He is currently on Eliquis  and is concerned about the bleeding risk associated with it, as he bruises easily and experiences bleeding from minor scratches. He has a history of rectal bleeding, previously diagnosed as colitis, which worsened with the initiation of Eliquis  but has since stabilized.  He also has COPD, which affects his breathing and mobility, and he finds it challenging to walk due to his clogged arteries and breathing issues. He feels depressed due to his limited mobility and multiple health issues.  He mentions experiencing occasional ankle swelling, particularly in the left leg, which he attributes to his artery issues and has been ongoing for six to seven months. No dizziness or lightheadedness upon standing.  Review of systems complete and found to be negative unless listed in HPI.   EP Information / Studies Reviewed:    EKG is not ordered today. EKG from 09/07/23 reviewed which showed sinus rhythm with PVC.  EKG Interpretation Date/Time:  Tuesday July 12 2024 10:18:39 EDT Ventricular Rate:  58 PR Interval:  186 QRS Duration:  122 QT Interval:  422 QTC Calculation: 414 R Axis:   68  Text Interpretation: Sinus bradycardia Non-specific intra-ventricular conduction delay Minimal voltage criteria for LVH, may be normal variant ( Cornell product ) ST & T wave abnormality, consider anterior ischemia When compared with ECG of 05-May-2024 12:00, No significant change was found Confirmed by Kitty Fonda [phone]) on 07/12/2024 10:45:47 AM   Zio 11/03/23: Predominant rhythm is sinus with prolonged PR interval, heart rate ranging from 51 bpm up to  96 bpm and average heart rate 67 bpm. Paroxysmal atrial fibrillation was noted representing 6% total rhythm burden.  Longest episode lasted for 3 hours and 57 minutes. There were rare PACs including atrial couplets and triplets representing less than 1% total  beats. There were occasional PVCs representing 3.7% total beats with otherwise rare ventricular couplets and triplets as well as limited episodes of ventricular bigeminy and trigeminy. No pauses or high degree heart block.  Some Wenckebach conduction was noted.  Echo 09/19/22:  1. Left ventricular ejection fraction, by estimation, is 55 to 60%. The  left ventricle has normal function. The left ventricle has no regional  wall motion abnormalities. There is mild left ventricular hypertrophy.  Left ventricular diastolic parameters  were normal.   2. Right ventricular systolic function is normal. The right ventricular  size is normal.   3. The mitral valve is normal in structure. No evidence of mitral valve  regurgitation. No evidence of mitral stenosis.   4. The aortic valve has an indeterminant number of cusps. There is mild  calcification of the aortic valve. There is mild thickening of the aortic  valve. Aortic valve regurgitation is not visualized. No aortic stenosis is  present.   5. Aortic dilatation noted. There is mild dilatation of the aortic root,  measuring 40 mm.   6. The inferior vena cava is normal in size with greater than 50%  respiratory variability, suggesting right atrial pressure of 3 mmHg.   Nuclear Stress 09/19/22:    The study is low risk.   Small to moderate size moderate intensity inferior defect with mild reversibility and normal wall motion. Finding may represent prior inferior infarct with mild peri-infarct ischemia, however given preserved wall motion also consider slight differences in diaphragmatic attenuation artifact. Either finding would support low risk.   Left ventricular function is normal. Nuclear stress EF: 57 %. End diastolic cavity size is normal.  Risk Assessment/Calculations:    CHA2DS2-VASc Score = 4   This indicates a 4.8% annual risk of stroke. The patient's score is based upon: CHF History: 1 HTN History: 1 Diabetes History: 0 Stroke  History: 0 Vascular Disease History: 1 Age Score: 1 Gender Score: 0         Physical Exam:   VS:  BP (!) 177/81   Pulse (!) 58   Ht 6' 1 (1.854 m)   Wt 218 lb 12.8 oz (99.2 kg)   SpO2 99%   BMI 28.87 kg/m    Wt Readings from Last 3 Encounters:  07/12/24 218 lb 12.8 oz (99.2 kg)  06/14/24 216 lb 12.8 oz (98.3 kg)  05/05/24 215 lb 12.8 oz (97.9 kg)     GEN: Well nourished, well developed in no acute distress NECK: No JVD CARDIAC: Bradycardic, regular rhythm. RESPIRATORY:  Clear to auscultation without rales, wheezing or rhonchi  ABDOMEN: Soft, non-distended EXTREMITIES:  No edema; No deformity   ASSESSMENT AND PLAN:    # Paroxysmal atrial fibrillation, symptomatic: No known episodes of sustained AF since ablation. # Typical atrial flutter status post ablation: No known episodes of atrial flutter since ablation. -Continue metoprolol  50 mg twice daily.  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 -Patient is interested in coming off of Eliquis  long-term.  He has had prior GI bleeding.  He was initially sent to me for Watchman device.  We have discussed his candidacy for Watchman device previously, which I feel would be reasonable.  He has not  had sustained atrial fibrillation since his ablation.  We discussed strategies for long-term monitoring.  He will return in 3 months and if he has not had any atrial fibrillation and has a long-term monitoring strategy, either wearable device or loop recorder, then we could discontinue oral anticoagulation.  Alternatively, if he is not comfortable with that strategy the watchman would be reasonable.  He will continue Eliquis  5 mg twice daily for now.  # CAD status post CABG: Denies chest pain. -Continue aspirin  81 mg once daily, ezetimibe -simvastatin  10-40 mg, Imdur  30 mg once daily, evolocumab  140 mg injection every 14 days.  # Hypertension - Above goal today.  Recommend checking  blood pressures 1-2 times per week at home and recording the values.  Recommend bringing these recordings to the primary care physician.  [name], MD

## 2024-07-12 ENCOUNTER — Ambulatory Visit: Attending: Cardiology | Admitting: Cardiology

## 2024-07-12 ENCOUNTER — Encounter: Payer: Self-pay | Admitting: Cardiology

## 2024-07-12 VITALS — BP 177/81 | HR 58 | Ht 73.0 in | Wt 218.8 lb

## 2024-07-12 DIAGNOSIS — I480 Paroxysmal atrial fibrillation: Secondary | ICD-10-CM

## 2024-07-12 DIAGNOSIS — I10 Essential (primary) hypertension: Secondary | ICD-10-CM | POA: Diagnosis not present

## 2024-07-12 DIAGNOSIS — D6869 Other thrombophilia: Secondary | ICD-10-CM

## 2024-07-12 DIAGNOSIS — I483 Typical atrial flutter: Secondary | ICD-10-CM | POA: Diagnosis not present

## 2024-07-12 DIAGNOSIS — I4891 Unspecified atrial fibrillation: Secondary | ICD-10-CM | POA: Diagnosis not present

## 2024-07-12 DIAGNOSIS — Z951 Presence of aortocoronary bypass graft: Secondary | ICD-10-CM | POA: Diagnosis not present

## 2024-07-12 NOTE — Patient Instructions (Signed)
 Medication Instructions:  Your physician recommends that you continue on your current medications as directed. Please refer to the Current Medication list given to you today.  *If you need a refill on your cardiac medications before your next appointment, please call your pharmacy*  Follow-Up: At [facility], you and your health needs are our priority.  As part of our continuing mission to provide you with exceptional heart care, our providers are all part of one team.  This team includes your primary Cardiologist (physician) and Advanced Practice Providers or APPs (Physician Assistants and Nurse Practitioners) who all work together to provide you with the care you need, when you need it.  Your next appointment:   3 month  Provider:   You may see [name], MD or one of the following Advanced Practice Providers on your designated Care Team:   Charlies Arthur, PA-C Michael Andy Tillery, PA-C Suzann Riddle, NP Daphne Barrack, NP

## 2024-07-13 ENCOUNTER — Ambulatory Visit: Admitting: Internal Medicine

## 2024-07-13 ENCOUNTER — Other Ambulatory Visit: Payer: Self-pay | Admitting: Internal Medicine

## 2024-07-13 DIAGNOSIS — F419 Anxiety disorder, unspecified: Secondary | ICD-10-CM

## 2024-07-19 DIAGNOSIS — J449 Chronic obstructive pulmonary disease, unspecified: Secondary | ICD-10-CM | POA: Diagnosis not present

## 2024-08-01 ENCOUNTER — Telehealth: Payer: Self-pay | Admitting: Cardiology

## 2024-08-01 ENCOUNTER — Encounter: Payer: Self-pay | Admitting: Physician Assistant

## 2024-08-01 ENCOUNTER — Ambulatory Visit: Attending: Physician Assistant | Admitting: Physician Assistant

## 2024-08-01 VITALS — BP 148/80 | HR 66 | Ht 73.0 in | Wt 213.4 lb

## 2024-08-01 DIAGNOSIS — D6869 Other thrombophilia: Secondary | ICD-10-CM

## 2024-08-01 DIAGNOSIS — I10 Essential (primary) hypertension: Secondary | ICD-10-CM | POA: Diagnosis not present

## 2024-08-01 DIAGNOSIS — I480 Paroxysmal atrial fibrillation: Secondary | ICD-10-CM

## 2024-08-01 DIAGNOSIS — I739 Peripheral vascular disease, unspecified: Secondary | ICD-10-CM

## 2024-08-01 DIAGNOSIS — I25119 Atherosclerotic heart disease of native coronary artery with unspecified angina pectoris: Secondary | ICD-10-CM

## 2024-08-01 MED ORDER — METOPROLOL TARTRATE 25 MG PO TABS: 25.0000 mg | ORAL_TABLET | Freq: Two times a day (BID) | ORAL | 3 refills | Status: AC

## 2024-08-01 MED ORDER — AMLODIPINE BESYLATE 5 MG PO TABS: 5.0000 mg | ORAL_TABLET | Freq: Every day | ORAL | 3 refills | Status: AC

## 2024-08-01 NOTE — Telephone Encounter (Signed)
 Spoke to patient he stated he had a bad weekend.Stated this past Friday and Saturday he had episodes of afib,elevated B/P.Readings listed below.He feels alittle better at present/B/P still elevated 185/90,153/96.Pulse 60.Appointment scheduled with Charlies Arthur PA today at 3:35 pm.

## 2024-08-01 NOTE — Patient Instructions (Signed)
 Medication Instructions:   START TAKING:   METOPROLOL  25 MG TWICE A DAY    *If you need a refill on your cardiac medications before your next appointment, please call your pharmacy*    Lab Work: NONE ORDERED  TODAY     If you have labs (blood work) drawn today and your tests are completely normal, you will receive your results only by: MyChart Message (if you have MyChart) OR A paper copy in the mail If you have any lab test that is abnormal or we need to change your treatment, we will call you to review the results.   Testing/Procedures: NONE ORDERED  TODAY      Follow-Up:  At [facility], you and your health needs are our priority.  As part of our continuing mission to provide you with exceptional heart care, our providers are all part of one team.  This team includes your primary Cardiologist (physician) and Advanced Practice Providers or APPs (Physician Assistants and Nurse Practitioners) who all work together to provide you with the care you need, when you need it.   Your next appointment:    2 week(s)  Provider:  Charlies Arthur, PA-C ( CONTACT  CASSIE HALL/ ANGELINE HAMMER FOR EP SCHEDULING ISSUES )    We recommend signing up for the patient portal called MyChart.  Sign up information is provided on this After Visit Summary.  MyChart is used to connect with patients for Virtual Visits (Telemedicine).  Patients are able to view lab/test results, encounter notes, upcoming appointments, etc.  Non-urgent messages can be sent to your provider as well.   To learn more about what you can do with MyChart, go to [email or web address].   Other Instructions

## 2024-08-01 NOTE — Progress Notes (Signed)
 Cardiology Office Note:  .   Date:  08/01/2024  ID:  DIMETRIUS [name], DOB 1951/04/29, MRN [id number] PCP: [name], MD  [facility] Providers Cardiologist:  [name], MD Electrophysiologist:  [name], MD {  History of Present Illness: .   Dennis [name] is a 73 y.o. male w/PMHx of  HTN, HLD, COPD CAD (CABG) PVD (RCF endarterectomy) AFib   He saw Dr. Kitty 07/12/24, reported much improved symptoms/burden of his Afib though reported episodes of times he felt his heart was stopping or missing a beat, which were brief and followed by high blood pressure and breathing difficulties.  Also some issues with his BP meds that had gotten settled out. PVD/claudications   Pt expressed he would like to come off a/c with hx of GIB, did think he would be a watchman candidate > planned to see him back in 3 mo > think about reliable monitoring options such as watch tech or loop >> perhaps watchman if he felt uncomfortable with those strategies  Today's visit is scheduled as a work in visit for reports of AFib and high BPs ROS:   He comes today accompanied by his wife. Since his ablation thinks he has had about 5 episodes of AFib, 2 have lasted a couple days and make him feel pretty awful.  Reports at baseline some degree of SOB with his COPD. When in AFib breathing is worse, feels weak, tired  This weekend noticed his BP very high 200+/100+ and worried he was going to have a stroke > took an extra 1/2 tab of his losartan /hydrochlorothiazide  As well as his rescue inhalers/nebs Sunday not much better and took an old 100/25mg  pill BID All weekend as well in AFib, very irregular pulse, sometime starts stops, with palpated/counted rates 180 or so All settled down last evening and feels back to his baseline Is quite anxiety provoking  After much conversation Turns out he had self stopped amlodipine  with concerns of side effects and his Imdur  worried was too much in  combination with a blood thinner. He admits that he does quite a lot of self research and if there are potential concerns, side effects gets weary about them and will stop. Feels like he is battling too many health issues and doesn't need the medicines to contribute to them > ie hurting his kidneys, liver etc.  He reports his COPD as pretty bad and even when NOT in AFib his COPD is usually his 1st limitation to exertional capacity even before his claudication. Does not report CP No syncope  He does report quite a bit of personal stressors/family issues that keep him pretty worried   Arrhythmia/AAD hx AFlutter ablation 11/21/2018 (Dr. Waddell) AFib ablation 04/07/24 (Dr. Kitty)  Studies Reviewed: SABRA    EKG done today and reviewed by myself:  SR 66bpm, ST/T changes unchanged from priors  Echo 09/19/22:  1. Left ventricular ejection fraction, by estimation, is 55 to 60%. The  left ventricle has normal function. The left ventricle has no regional  wall motion abnormalities. There is mild left ventricular hypertrophy.  Left ventricular diastolic parameters  were normal.   2. Right ventricular systolic function is normal. The right ventricular  size is normal.   3. The mitral valve is normal in structure. No evidence of mitral valve  regurgitation. No evidence of mitral stenosis.   4. The aortic valve has an indeterminant number of cusps. There is mild  calcification of the aortic valve. There is  mild thickening of the aortic  valve. Aortic valve regurgitation is not visualized. No aortic stenosis is  present.   5. Aortic dilatation noted. There is mild dilatation of the aortic root,  measuring 40 mm.   6. The inferior vena cava is normal in size with greater than 50%  respiratory variability, suggesting right atrial pressure of 3 mmHg.    Nuclear Stress 09/19/22:    The study is low risk.   Small to moderate size moderate intensity inferior defect with mild reversibility and  normal wall motion. Finding may represent prior inferior infarct with mild peri-infarct ischemia, however given preserved wall motion also consider slight differences in diaphragmatic attenuation artifact. Either finding would support low risk.   Left ventricular function is normal. Nuclear stress EF: 57 %. End diastolic cavity size is normal.     Risk Assessment/Calculations:    Physical Exam:   VS:  There were no vitals taken for this visit.   Wt Readings from Last 3 Encounters:  07/12/24 218 lb 12.8 oz (99.2 kg)  06/14/24 216 lb 12.8 oz (98.3 kg)  05/05/24 215 lb 12.8 oz (97.9 kg)    GEN: Well nourished, well developed in no acute distress NECK: No JVD; No carotid bruits CARDIAC: RRR, no murmurs, rubs, gallops RESPIRATORY:   CTA b/l without rales, wheezing or rhonchi  ABDOMEN: Soft, non-tender, non-distended EXTREMITIES:  No edema; No deformity   ASSESSMENT AND PLAN: .    paroxysmal AFib CHA2DS2Vasc is 3, on Eliquis , appropriately dosed HTN Not completely controlled with episodes of very high readings on occasion  Long discussion about medication compliance He was not taking 2 meds we thought he was and was taking extra of another. Discussed very important not to self stop medications/adjust meds without including us  so we at least know what he is actually doing.  Advised he resume his amlodipine  5mg  daily Will reduce his metoprolol  to 25mg  BID give sounds like his COPD is significant  If he has more Afib on lowered BB dose to resume 50mg  BID, let us  know Urged ER if he is feeling as bad as her reported over the weekend.  Sounds like he may have had a couple triggers for his AFib Increased stress +/- if very high BP triggered the AFib or AFib triggered HTN  Will see him back again in 2 weeks  CAD PVD C/w Dr. McDowell/team Long discussion about medication compliance > he is very weary about all of his cholesterol medications  Secondary hypercoagulable state 2/2  AFib   Dispo: as above, sooner if needed  Signed, [name], PA-C

## 2024-08-01 NOTE — Telephone Encounter (Signed)
 Pt c/o BP issue: STAT if pt c/o blurred vision, one-sided weakness or slurred speech.  STAT if BP is GREATER than 180/120 TODAY.  STAT if BP is LESS than 90/60 and SYMPTOMATIC TODAY  1. What is your BP concern? Bp elevated since Friday  2. Have you taken any BP medication today? Yes  3. What are your last 5 BP readings? 144/78 - Today 186/86 - Last night 192/90  4. Are you having any other symptoms (ex. Dizziness, headache, blurred vision, passed out)? Headache, tired

## 2024-08-16 ENCOUNTER — Other Ambulatory Visit: Payer: Self-pay | Admitting: Internal Medicine

## 2024-08-16 DIAGNOSIS — I25118 Atherosclerotic heart disease of native coronary artery with other forms of angina pectoris: Secondary | ICD-10-CM

## 2024-08-16 DIAGNOSIS — I739 Peripheral vascular disease, unspecified: Secondary | ICD-10-CM

## 2024-08-17 ENCOUNTER — Telehealth: Payer: Self-pay | Admitting: Orthopedic Surgery

## 2024-08-17 NOTE — Telephone Encounter (Signed)
 Patient called and he is in severe pain that he can't even walk and wants to know if you could fit him in. [phone]

## 2024-08-17 NOTE — Progress Notes (Deleted)
 Cardiology Office Note:  .   Date:  08/17/2024  ID:  Dennis [name], DOB 08-19-1951, MRN [id number] PCP: [name], MD  [facility] Providers Cardiologist:  [name], MD Electrophysiologist:  [name], MD {  History of Present Illness: .   Dennis [name] is a 73 y.o. male w/PMHx of  HTN, HLD, COPD CAD (CABG) PVD (RCF endarterectomy) AFib   He saw Dr. Kitty 07/12/24, reported much improved symptoms/burden of his Afib though reported episodes of times he felt his heart was stopping or missing a beat, which were brief and followed by high blood pressure and breathing difficulties.  Also some issues with his BP meds that had gotten settled out. PVD/claudications   Pt expressed he would like to come off a/c with hx of GIB, did think he would be a watchman candidate > planned to see him back in 3 mo > think about reliable monitoring options such as watch tech or loop >> perhaps watchman if he felt uncomfortable with those strategies  I saw him 08/01/24 as a work in visit for reports of AFib and high BPs  He comes today accompanied by his wife. Since his ablation thinks he has had about 5 episodes of AFib, 2 have lasted a couple days and make him feel pretty awful.  Reports at baseline some degree of SOB with his COPD. When in AFib breathing is worse, feels weak, tired This weekend noticed his BP very high 200+/100+ and worried he was going to have a stroke > took an extra 1/2 tab of his losartan /hydrochlorothiazide  As well as his rescue inhalers/nebs Sunday not much better and took an old 100/25mg  pill BID All weekend as well in AFib, very irregular pulse, sometime starts stops, with palpated/counted rates 180 or so All settled down last evening and feels back to his baseline Is quite anxiety provoking After much conversation Turns out he had self stopped amlodipine  with concerns of side effects and his Imdur  worried was too much in combination with a blood  thinner. He admits that he does quite a lot of self research and if there are potential concerns, side effects gets weary about them and will stop. Feels like he is battling too many health issues and doesn't need the medicines to contribute to them > ie hurting his kidneys, liver etc. He reports his COPD as pretty bad and even when NOT in AFib his COPD is usually his 1st limitation to exertional capacity even before his claudication. Does not report CP No syncope He does report quite a bit of personal stressors/family issues that keep him pretty worried  Discussed importance of medications, compliance. Metoprolol  dose reduced with significant COPD and use of rescue inhaler regularly Advised he resume his amlodipine   Today's visit is scheduled as a planned f/u ROS:   *** AFib? *** taking meds? *** symptoms *** eliquis    Arrhythmia/AAD hx AFlutter ablation 11/21/2018 (Dr. Waddell) AFib ablation 04/07/24 (Dr. Kitty)  Studies Reviewed: SABRA    EKG done today and reviewed by myself:  SR 66bpm, ST/T changes unchanged from priors  Echo 09/19/22:  1. Left ventricular ejection fraction, by estimation, is 55 to 60%. The  left ventricle has normal function. The left ventricle has no regional  wall motion abnormalities. There is mild left ventricular hypertrophy.  Left ventricular diastolic parameters  were normal.   2. Right ventricular systolic function is normal. The right ventricular  size is normal.   3. The mitral valve is  normal in structure. No evidence of mitral valve  regurgitation. No evidence of mitral stenosis.   4. The aortic valve has an indeterminant number of cusps. There is mild  calcification of the aortic valve. There is mild thickening of the aortic  valve. Aortic valve regurgitation is not visualized. No aortic stenosis is  present.   5. Aortic dilatation noted. There is mild dilatation of the aortic root,  measuring 40 mm.   6. The inferior vena cava is normal  in size with greater than 50%  respiratory variability, suggesting right atrial pressure of 3 mmHg.    Nuclear Stress 09/19/22:    The study is low risk.   Small to moderate size moderate intensity inferior defect with mild reversibility and normal wall motion. Finding may represent prior inferior infarct with mild peri-infarct ischemia, however given preserved wall motion also consider slight differences in diaphragmatic attenuation artifact. Either finding would support low risk.   Left ventricular function is normal. Nuclear stress EF: 57 %. End diastolic cavity size is normal.     Risk Assessment/Calculations:    Physical Exam:   VS:  There were no vitals taken for this visit.   Wt Readings from Last 3 Encounters:  08/01/24 213 lb 6.4 oz (96.8 kg)  07/12/24 218 lb 12.8 oz (99.2 kg)  06/14/24 216 lb 12.8 oz (98.3 kg)    GEN: Well nourished, well developed in no acute distress NECK: No JVD; No carotid bruits CARDIAC: *** RRR, no murmurs, rubs, gallops RESPIRATORY: ***  CTA b/l without rales, wheezing or rhonchi  ABDOMEN: Soft, non-tender, non-distended EXTREMITIES: *** No edema; No deformity   ASSESSMENT AND PLAN: .    paroxysmal AFib CHA2DS2Vasc is 3, on Eliquis , *** appropriately dosed  HTN ***   CAD PVD C/w Dr. McDowell/team *** Long discussion about medication compliance > he is very weary about all of his cholesterol medications  Secondary hypercoagulable state 2/2 AFib   Dispo: *** , sooner if needed  Signed, [name], PA-C

## 2024-08-18 ENCOUNTER — Ambulatory Visit: Admitting: Physician Assistant

## 2024-08-18 ENCOUNTER — Other Ambulatory Visit: Payer: Self-pay

## 2024-08-18 NOTE — Telephone Encounter (Signed)
 I called talked with patient. States he is actually feeling much better after taking 2 doses of ibuprofen yesterday.  Offered him to see Herlene in [place] this coming Wednesday but patient declined stating he preferred to wait until Dr Brion next available on Oct 16. I scheduled this for patient. He will call if he gets worse. He understands would likely be best to see Herlene in [place] if needing to be seen sooner than 10/16.

## 2024-08-19 DIAGNOSIS — J449 Chronic obstructive pulmonary disease, unspecified: Secondary | ICD-10-CM | POA: Diagnosis not present

## 2024-08-24 ENCOUNTER — Other Ambulatory Visit: Payer: Self-pay | Admitting: Internal Medicine

## 2024-08-24 DIAGNOSIS — Z792 Long term (current) use of antibiotics: Secondary | ICD-10-CM

## 2024-08-29 ENCOUNTER — Telehealth: Payer: Self-pay

## 2024-08-29 ENCOUNTER — Other Ambulatory Visit: Payer: Self-pay | Admitting: Internal Medicine

## 2024-08-29 DIAGNOSIS — J441 Chronic obstructive pulmonary disease with (acute) exacerbation: Secondary | ICD-10-CM

## 2024-08-29 MED ORDER — AMOXICILLIN-POT CLAVULANATE 875-125 MG PO TABS: 1.0000 | ORAL_TABLET | Freq: Two times a day (BID) | ORAL | 0 refills | Status: DC

## 2024-08-29 MED ORDER — METHYLPREDNISOLONE 4 MG PO TBPK: ORAL_TABLET | ORAL | 0 refills | Status: DC

## 2024-08-29 NOTE — Telephone Encounter (Signed)
 Pt spouse informed

## 2024-08-29 NOTE — Telephone Encounter (Signed)
 Copied from CRM [id number]. Topic: Clinical - Medical Advice >> Aug 29, 2024  9:35 AM Leonette SQUIBB wrote: Reason for CRM: patient called saying he has copd.  He has Congestion, cough and does not want to come in.  He is asking for amoxil  and prednisone .  He says that always helps.  No fever.  He is using his nebulizer.    CB#  [phone]  [facility] [address]  [place]

## 2024-09-06 ENCOUNTER — Other Ambulatory Visit: Payer: Self-pay | Admitting: Internal Medicine

## 2024-09-06 DIAGNOSIS — I739 Peripheral vascular disease, unspecified: Secondary | ICD-10-CM

## 2024-09-06 DIAGNOSIS — I25118 Atherosclerotic heart disease of native coronary artery with other forms of angina pectoris: Secondary | ICD-10-CM

## 2024-09-07 ENCOUNTER — Other Ambulatory Visit: Payer: Self-pay | Admitting: Internal Medicine

## 2024-09-07 DIAGNOSIS — I10 Essential (primary) hypertension: Secondary | ICD-10-CM

## 2024-09-08 ENCOUNTER — Ambulatory Visit: Admitting: Orthopedic Surgery

## 2024-09-08 ENCOUNTER — Other Ambulatory Visit: Payer: Self-pay | Admitting: Internal Medicine

## 2024-09-08 DIAGNOSIS — E785 Hyperlipidemia, unspecified: Secondary | ICD-10-CM

## 2024-09-21 ENCOUNTER — Other Ambulatory Visit: Payer: Self-pay

## 2024-09-21 ENCOUNTER — Ambulatory Visit: Admitting: Orthopedic Surgery

## 2024-09-21 ENCOUNTER — Encounter: Payer: Self-pay | Admitting: Orthopedic Surgery

## 2024-09-21 ENCOUNTER — Other Ambulatory Visit (INDEPENDENT_AMBULATORY_CARE_PROVIDER_SITE_OTHER): Payer: Self-pay

## 2024-09-21 DIAGNOSIS — M25552 Pain in left hip: Secondary | ICD-10-CM

## 2024-09-21 NOTE — Progress Notes (Signed)
 Office Visit Note   Patient: Dennis [name]           Date of Birth: 08/10/1951           MRN: [id number] Visit Date: 09/21/2024 Requested by: [name], MD [address] [place],  [place] [address] PCP: [name], MD  Subjective: Chief Complaint  Patient presents with   Left Hip - Pain    HPI: Dennis [name] is a 73 y.o. male who presents to the office reporting left hip and leg pain with radicular component.  Denies any history of injury.  Pain does wake him from sleep at night.  Denies any groin pain.  Does have some buttock pain with numbness and tingling extending down the leg but fairly absent low back pain.  Been going on for 7 weeks.  Takes muscle relaxer as needed.  He is actually been improving a little bit clinically by changing his shoes.  He does have a history of having angioplasty done on that right leg arterial system and needs it done on the left leg as well.  Does report bilateral toe numbness from neuropathy.  MRI scan performed about 3 years ago which showed only mild degenerative changes in the lumbar spine.  He also reports decreased exercise endurance due to COPD.  He has been on O2 at home but fairly infrequently..                ROS: All systems reviewed are negative as they relate to the chief complaint within the history of present illness.  Patient denies fevers or chills.  Assessment & Plan: Visit Diagnoses:  1. Pain in left hip     Plan: Impression is left hip and leg pain with no arthritis on plain radiographs in the hip joint.  I think this could be radicular in nature versus vascular claudication.  He is pending further intervention on that left leg.  Both feet are perfused and warm today but have understandably diminished pulses.  I think any changing his shoes has made a clinical difference.  No indication for orthopedic intervention at this time.  He will follow-up with us  as needed.  Follow-Up Instructions: No follow-ups on file.    Orders:  Orders Placed This Encounter  Procedures   XR HIP UNILAT W OR W/O PELVIS 2-3 VIEWS LEFT   XR Lumbar Spine 2-3 Views   No orders of the defined types were placed in this encounter.     Procedures: No procedures performed   Clinical Data: No additional findings.  Objective: Vital Signs: There were no vitals taken for this visit.  Physical Exam:  Constitutional: Patient appears well-developed HEENT:  Head: Normocephalic Eyes:EOM are normal Neck: Normal range of motion Cardiovascular: Normal rate Pulmonary/chest: Effort normal Neurologic: Patient is alert Skin: Skin is warm Psychiatric: Patient has normal mood and affect  Ortho Exam: Ortho exam demonstrates perfused feet with some diminished sensation distally.  Feet are warm.  Pedal pulses diminished consistent with his known diagnosis of arterial stenosis affecting both lower extremities.  Does have 5 out of 5 ankle dorsiflexion plantarflexion quad and hamstring strength with no groin pain with internal/external rotation of the leg.  No paresthesias in the thigh or calf region bilaterally.  Gait nonantalgic.  No nerve root tension signs bilaterally.  Specialty Comments:  No specialty comments available.  Imaging: No results found.   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   Atherosclerosis of lower extremity with claudication 12/14/2023   Acute non-recurrent frontal sinusitis 11/24/2023   Paroxysmal atrial fibrillation (HCC) 11/12/2023   Chronic respiratory failure with hypoxia (HCC) 11/12/2023   PAD (peripheral artery disease) 09/30/2023   Skin tag 06/30/2023   Prediabetes 02/26/2023   PVD (peripheral vascular disease) with claudication 03/13/2022   Acute bronchitis with COPD (HCC) 12/30/2021   Onychomycosis 11/12/2021   Groin rash 07/16/2021    Chronic kidney disease, stage 3b (HCC) 01/02/2021   Benign prostatic hyperplasia with urinary frequency 09/25/2020   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   Anxiety 07/23/2020   Insomnia 07/23/2020   Sensorineural hearing loss (SNHL) of both ears 12/02/2018   PTSD (post-traumatic stress disorder) 11/10/2017   Tinnitus aurium, 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   Atrial flutter (HCC) 11/21/2016   COPD GOLD III with reversibility  10/01/2015   Hematochezia    Diverticulosis of colon without hemorrhage    Hemorrhoid    History of adenomatous polyp of colon 08/14/2015   Rectal bleeding 08/14/2015   Upper airway cough syndrome 08/13/2015   Dyspnea 08/13/2015   Chronic left hip pain 09/17/2014   HLD (hyperlipidemia) 04/08/2010   Essential hypertension 04/08/2010   Coronary atherosclerosis of native coronary artery 04/08/2010   COPD (chronic obstructive pulmonary disease) (HCC) 04/08/2010   Past Medical History:  Diagnosis Date   A-fib (HCC)    tx by Dr Danelle Birmingham   Asthma    Atrial flutter (HCC)    Diagnosed by ECG August 2015   Bilateral hearing loss 12/02/2018   no hearing aids   CKD (chronic kidney disease)    Colitis    COPD (chronic obstructive pulmonary disease) (HCC)    Coronary atherosclerosis of native coronary artery    Multivessel status post CABG   Essential hypertension    Hyperlipidemia    Insomnia    Peripheral vascular disease 11/11/2023   PTSD (post-traumatic stress disorder)     Family History  Problem Relation Age of Onset   Hypertension Mother    Alcohol abuse Mother    Alcohol abuse Father    Colon cancer Neg Hx     Past Surgical History:  Procedure Laterality Date   ABDOMINAL AORTOGRAM W/LOWER EXTREMITY Bilateral 12/03/2023   Procedure: ABDOMINAL AORTOGRAM W/LOWER EXTREMITY;  Surgeon: Gretta Lonni PARAS, MD;  Location: [facility] INVASIVE CV LAB;  Service: Cardiovascular;  Laterality: Bilateral;   ATRIAL FIBRILLATION ABLATION N/A 04/07/2024   Procedure: ATRIAL FIBRILLATION  ABLATION;  Surgeon: Kennyth Chew, MD;  Location: [facility] INVASIVE CV LAB;  Service: Cardiovascular;  Laterality: N/A;   COLONOSCOPY  01/29/2005   Dr. Shaaron; rectal polyp s/p polypectomy, otherwise normal.  Pathology with tubulovillous adenoma.   COLONOSCOPY  02/06/2008   Dr. Shaaron; minimal internal hemorrhoids, diminutive polyp in the splenic flexure s/p cold biopsy removal, otherwise normal.  Pathology with benign polypoid colonic mucosa.   COLONOSCOPY WITH PROPOFOL  N/A 08/30/2015   Procedure: COLONOSCOPY WITH PROPOFOL  at cecum at 0814; withdrawal time=8 minutes;  Surgeon: Lamar CHRISTELLA Shaaron, MD; internal hemorrhoids-likely source of hematochezia, otherwise normal exam.  Repeat in 5 years.   COLONOSCOPY WITH PROPOFOL  N/A 01/28/2021   Procedure: COLONOSCOPY WITH PROPOFOL ;  Surgeon: Shaaron Lamar CHRISTELLA, MD;  Location: [facility];  Service: Endoscopy;  Laterality: N/A;  am appt   CORONARY ARTERY BYPASS GRAFT  2005   Dr. Fleeta Trigt: LIMA to LAD, right radial to circumflex, SVG to RCA  CORONARY ARTERY BYPASS GRAFT  10/16/2004   ELECTROPHYSIOLOGIC STUDY N/A 11/21/2016   Procedure: A-Flutter Ablation;  Surgeon: Danelle LELON Birmingham, MD;  Location: [facility] INVASIVE CV LAB;  Service: Cardiovascular;  Laterality: N/A;   ENDARTERECTOMY FEMORAL Right 12/14/2023   Procedure: RIGHT ENDARTERECTOMY  COMMON FEMORAL PROFUNDOPLASTY WITH XENSURE BOVINE PATCH;  Surgeon: Gretta Lonni PARAS, MD;  Location: [facility] OR;  Service: Vascular;  Laterality: Right;   TEE WITHOUT CARDIOVERSION N/A 11/21/2016   Procedure: TRANSESOPHAGEAL ECHOCARDIOGRAM (TEE);  Surgeon: Jerel Balding, MD;  Location: [facility] ENDOSCOPY;  Service: Cardiovascular;  Laterality: N/A;   Social History   Occupational History   Not on file  Tobacco Use   Smoking status: Former    Current packs/day: 0.00    Average packs/day: 2.0 packs/day for 27.0 years (54.0 ttl pk-yrs)    Types: Cigarettes    Start date: 02/01/1971    Quit date: 01/31/1998    Years since quitting: 26.6    Smokeless tobacco: Current    Types: Chew  Vaping Use   Vaping status: Never Used  Substance and Sexual Activity   Alcohol use: Yes    Alcohol/week: 14.0 - 28.0 standard drinks of alcohol    Types: 14 - 28 Cans of beer per week    Comment: Drinks beer daily, typically 2-4 a day.   Drug use: No   Sexual activity: Yes

## 2024-09-26 ENCOUNTER — Encounter: Payer: Self-pay | Admitting: Radiology

## 2024-09-26 NOTE — Progress Notes (Signed)
 Electrophysiology Office Note:   Date:  09/29/2024  ID:  HAYK DIVIS, DOB 01/06/1951, MRN [id number]  Primary Cardiologist: [name], MD Electrophysiologist: [name], MD      History of Present Illness:   Dennis [name] is a 73 y.o. male with h/o hypertension, hyperlipidemia, COPD, CAD s/p CABG, atrial flutter s/p ablation, paroxysmal atrial fibrillation, PAD s/p right common femoral endarterectomy for claudication who is being seen today for follow up after catheter ablation.   Discussed the use of AI scribe software for clinical note transcription with the patient, who gave verbal consent to proceed.  History of Present Illness Dennis [name] is a 73 year old male with atrial fibrillation and COPD who presents for a follow-up visit to evaluate heart rhythm and symptoms post-ablation.  Since his last visit, he has not experienced significant episodes of atrial fibrillation that make him feel unwell. He occasionally perceives his heartbeat, especially in quiet situations, and notices moments where it seems to skip or pause. These episodes are mentally noticeable but do not cause physical discomfort. He describes these sensations as different from previous atrial fibrillation episodes, which were accompanied by high blood pressure, shortness of breath, and fatigue.  He recalls being diagnosed with a heart murmur in his late teens, which was deemed not concerning at the time. He inquires about the difference between a heart murmur, irregular heartbeat, and atrial fibrillation. He has a history of previous procedures, including a triple bypass.  COPD affects his physical activity, causing shortness of breath upon exertion, which has hindered his ability to exercise. He had planned to return to an exercise program at [facility] but has not followed through due to a temporary issue with his leg and hip, which has since resolved. He notes that his exercise capacity  was better when he was actively participating in the program.  There was a change in his metoprolol  dosage, adjusted by another doctor due to concerns about its effects on his COPD. Since the change, his heart rate has increased from the low sixties to the seventies. He is aware that some of his medications affect his respiratory function.  He experiences stress related to personal life, including family responsibilities. He has used nitroglycerin  on two occasions in the past month due to chest discomfort, which he attributes to dietary choices. He is motivated to improve his physical condition to engage more with his grandchildren, particularly in outdoor activities like hunting.  He is currently taking shots twice a month to manage his cholesterol, which he reports has successfully lowered his levels. He is on Eliquis  for atrial fibrillation.  Review of systems complete and found to be negative unless listed in HPI.   EP Information / Studies Reviewed:    EKG is not ordered today. EKG from 09/07/23 reviewed which showed sinus rhythm with PVC.      Zio 11/03/23: Predominant rhythm is sinus with prolonged PR interval, heart rate ranging from 51 bpm up to 96 bpm and average heart rate 67 bpm. Paroxysmal atrial fibrillation was noted representing 6% total rhythm burden.  Longest episode lasted for 3 hours and 57 minutes. There were rare PACs including atrial couplets and triplets representing less than 1% total beats. There were occasional PVCs representing 3.7% total beats with otherwise rare ventricular couplets and triplets as well as limited episodes of ventricular bigeminy and trigeminy. No pauses or high degree heart block.  Some Wenckebach conduction was noted.  Echo 09/19/22:  1. Left  ventricular ejection fraction, by estimation, is 55 to 60%. The  left ventricle has normal function. The left ventricle has no regional  wall motion abnormalities. There is mild left ventricular  hypertrophy.  Left ventricular diastolic parameters  were normal.   2. Right ventricular systolic function is normal. The right ventricular  size is normal.   3. The mitral valve is normal in structure. No evidence of mitral valve  regurgitation. No evidence of mitral stenosis.   4. The aortic valve has an indeterminant number of cusps. There is mild  calcification of the aortic valve. There is mild thickening of the aortic  valve. Aortic valve regurgitation is not visualized. No aortic stenosis is  present.   5. Aortic dilatation noted. There is mild dilatation of the aortic root,  measuring 40 mm.   6. The inferior vena cava is normal in size with greater than 50%  respiratory variability, suggesting right atrial pressure of 3 mmHg.   Nuclear Stress 09/19/22:    The study is low risk.   Small to moderate size moderate intensity inferior defect with mild reversibility and normal wall motion. Finding may represent prior inferior infarct with mild peri-infarct ischemia, however given preserved wall motion also consider slight differences in diaphragmatic attenuation artifact. Either finding would support low risk.   Left ventricular function is normal. Nuclear stress EF: 57 %. End diastolic cavity size is normal.  Risk Assessment/Calculations:    CHA2DS2-VASc Score = 4   This indicates a 4.8% annual risk of stroke. The patient's score is based upon: CHF History: 1 HTN History: 1 Diabetes History: 0 Stroke History: 0 Vascular Disease History: 1 Age Score: 1 Gender Score: 0         Physical Exam:   VS:  BP (!) 149/76   Pulse 67   Ht 6' 1 (1.854 m)   Wt 205 lb (93 kg)   SpO2 94%   BMI 27.05 kg/m    Wt Readings from Last 3 Encounters:  09/27/24 205 lb (93 kg)  08/01/24 213 lb 6.4 oz (96.8 kg)  07/12/24 218 lb 12.8 oz (99.2 kg)     GEN: Well nourished, well developed in no acute distress NECK: No JVD CARDIAC: Normal rate, regular rhythm. RESPIRATORY:  Clear to  auscultation without rales, wheezing or rhonchi  ABDOMEN: Soft, non-distended EXTREMITIES:  No edema; No deformity   ASSESSMENT AND PLAN:    # Paroxysmal atrial fibrillation, symptomatic: No known episodes of sustained AF since ablation. # Typical atrial flutter status post ablation: No known episodes of atrial flutter since ablation. -Continue metoprolol  25 mg twice daily.  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 -He has had prior GI bleeding, none recently.  He was initially sent to me for Watchman device.  We have discussed his candidacy for Watchman device previously, which I feel would be reasonable.  He has not had sustained atrial fibrillation since his ablation.  We discussed strategies for long-term monitoring. He will continue Eliquis  5 mg twice daily for now.  # CAD status post CABG: Denies chest pain. -Continue aspirin  81 mg once daily, ezetimibe -simvastatin  10-40 mg, Imdur  30 mg once daily, evolocumab  140 mg injection every 14 days.  # Hypertension - Above goal today.  Recommend checking blood pressures 1-2 times per week at home and recording the values.  Recommend bringing these recordings to the primary care physician.  [name], MD

## 2024-09-27 ENCOUNTER — Encounter: Payer: Self-pay | Admitting: Cardiology

## 2024-09-27 ENCOUNTER — Ambulatory Visit: Attending: Cardiology | Admitting: Cardiology

## 2024-09-27 VITALS — BP 149/76 | HR 67 | Ht 73.0 in | Wt 205.0 lb

## 2024-09-27 DIAGNOSIS — I483 Typical atrial flutter: Secondary | ICD-10-CM | POA: Diagnosis not present

## 2024-09-27 DIAGNOSIS — Z951 Presence of aortocoronary bypass graft: Secondary | ICD-10-CM

## 2024-09-27 DIAGNOSIS — D6869 Other thrombophilia: Secondary | ICD-10-CM | POA: Diagnosis not present

## 2024-09-27 DIAGNOSIS — I10 Essential (primary) hypertension: Secondary | ICD-10-CM

## 2024-09-27 DIAGNOSIS — I480 Paroxysmal atrial fibrillation: Secondary | ICD-10-CM

## 2024-09-27 NOTE — Patient Instructions (Signed)

## 2024-10-10 ENCOUNTER — Ambulatory Visit: Admitting: Student

## 2024-10-13 ENCOUNTER — Other Ambulatory Visit: Payer: Self-pay | Admitting: Internal Medicine

## 2024-10-13 DIAGNOSIS — G4700 Insomnia, unspecified: Secondary | ICD-10-CM

## 2024-10-19 DIAGNOSIS — J449 Chronic obstructive pulmonary disease, unspecified: Secondary | ICD-10-CM | POA: Diagnosis not present

## 2024-10-24 ENCOUNTER — Other Ambulatory Visit: Payer: Self-pay | Admitting: Physician Assistant

## 2024-10-24 ENCOUNTER — Other Ambulatory Visit: Payer: Self-pay | Admitting: Internal Medicine

## 2024-10-24 DIAGNOSIS — I739 Peripheral vascular disease, unspecified: Secondary | ICD-10-CM

## 2024-10-24 DIAGNOSIS — J441 Chronic obstructive pulmonary disease with (acute) exacerbation: Secondary | ICD-10-CM

## 2024-11-18 DIAGNOSIS — J449 Chronic obstructive pulmonary disease, unspecified: Secondary | ICD-10-CM | POA: Diagnosis not present

## 2024-11-22 ENCOUNTER — Ambulatory Visit: Payer: Self-pay | Admitting: Internal Medicine

## 2024-11-22 NOTE — Telephone Encounter (Signed)
 FYI Only or Action Required?: Action required by provider: clinical question for provider, update on patient condition, and refused ED recommendation.  Patient is followed in Pulmonology for COPD, last seen on 06/19/2022 by Dennis Ozell NOVAK, Dennis [name].  Called Nurse Triage reporting Shortness of Breath.  Symptoms began several days ago.  Interventions attempted: Rescue inhaler, Maintenance inhaler, Nebulizer treatments, and Home oxygen  use.  Symptoms are: gradually worsening.  Triage Disposition: Go to ED Now (Notify PCP)  Patient/caregiver understands and will follow disposition?: No, refuses disposition  E2C2 Pulmonary Triage - Initial Assessment Questions Chief Complaint (e.g., cough, sob, wheezing, fever, chills, sweat or additional symptoms) *Go to specific symptom protocol after initial questions. Patient with hx of COPD calling to report shortness of breath and severe coughing since the weekend. Patient reports three COPD exacerbations since October. Patient reports chills, sweating, runny nose and fatigue but no reported fever. Patient states he is waking up with chest tightness every time he wakes up from sleep. Reports severe shortness of breath with both movement and at rest depending on what he is doing. Patient states this symptoms seem to have started after a Repatha  shot. Good use of inhalers with positive results per patient. Reports waking up late last night to go to the bathroom and noticed he was very short of breath and pulse oximeter was 72% on room air. Patient did put his oxygen  on and within 10 minutes pulse oximeter reading went up to 94%. Wife states patient is unwilling to keep oxygen  on. Went back to sleep but wife checked his oxygen  while he was sleeping. Oxygen  saturation reading was 86% on room air. Did recommend patient to the ED but refusing at this time. Patient is asking to be seen in the office. Asking for a call from the office.   How long have symptoms been  present? Started over the weekend  Have you tested for COVID or Flu? Note: If not, ask patient if a home test can be taken. If so, instruct patient to call back for positive results. No  MEDICINES:   Have you used any OTC meds to help with symptoms? No If yes, ask What medications? no  Have you used your inhalers/maintenance medication? Yes If yes, What medications? Albuterol  inhaler/nebulizer Symbicort   If inhaler, ask How many puffs and how often? Note: Review instructions on medication in the chart. Symbicort  2 puffs BID Albuterol  2 puffs Q4H PRN  OXYGEN : Do you wear supplemental oxygen ? Yes If yes, How many liters are you supposed to use? PRN but noncompliant Was ordered to be used at night for 2L  Do you monitor your oxygen  levels? Yes If yes, What is your reading (oxygen  level) today? 94% on room air  What is your usual oxygen  saturation reading?  (Note: Pulmonary O2 sats should be 90% or greater) 93% on room air   Copied from CRM #[id number]. Topic: Clinical - Red Word Triage >> Nov 22, 2024  9:45 AM Dennis [name] wrote: Red Word that prompted transfer to Nurse Triage: COPD flare up, oxygen  at 74, SOB. Reason for Disposition  [1] MODERATE difficulty breathing (e.g., speaks in phrases, SOB even at rest, pulse 100-120) AND [2] NEW-onset or WORSE than normal  Answer Assessment - Initial Assessment Questions 6. CARDIAC HISTORY: Do you have any history of heart disease? (e.g., heart attack, angina, bypass surgery, angioplasty)      yes 7. LUNG HISTORY: Do you have any history of lung disease?  (e.g., pulmonary embolus, asthma, emphysema)  yes 12. TRAVEL: Have you traveled out of the country in the last month? (e.g., travel history, exposures)       no  Protocols used: Breathing Difficulty-A-AH

## 2024-11-23 ENCOUNTER — Other Ambulatory Visit: Payer: Self-pay | Admitting: Internal Medicine

## 2024-11-23 NOTE — Telephone Encounter (Signed)
"  Left voicemail for patient to give us  a call back.   "

## 2024-11-23 NOTE — Telephone Encounter (Signed)
 Need appt for COPD flare up, oxygen  at 74, SOB.

## 2024-11-30 NOTE — Telephone Encounter (Signed)
 Copied from CRM #[id number]. Topic: Appointments - Appointment Scheduling >> Nov 30, 2024 11:31 AM Rilla NOVAK wrote: FYI: Patient requesting to see Dr Darlean in [place].  States he is doing well and has gotten through the symptoms he was having and don't want to drive to [facility] to see Dr Darlean, would prefer [place]. Appt Rescheduled to 1/28 as he does want to come in. Patient placed on the wait list also. States if anything is [place] opens up he is open to coming in sooner.

## 2024-12-01 ENCOUNTER — Other Ambulatory Visit: Payer: Self-pay | Admitting: Internal Medicine

## 2024-12-01 ENCOUNTER — Ambulatory Visit (INDEPENDENT_AMBULATORY_CARE_PROVIDER_SITE_OTHER): Admitting: Internal Medicine

## 2024-12-01 ENCOUNTER — Encounter: Payer: Self-pay | Admitting: Internal Medicine

## 2024-12-01 ENCOUNTER — Ambulatory Visit: Payer: Self-pay | Admitting: Internal Medicine

## 2024-12-01 VITALS — BP 109/69 | HR 77 | Ht 73.0 in | Wt 211.0 lb

## 2024-12-01 DIAGNOSIS — I480 Paroxysmal atrial fibrillation: Secondary | ICD-10-CM

## 2024-12-01 DIAGNOSIS — J42 Unspecified chronic bronchitis: Secondary | ICD-10-CM

## 2024-12-01 DIAGNOSIS — J9611 Chronic respiratory failure with hypoxia: Secondary | ICD-10-CM | POA: Diagnosis not present

## 2024-12-01 DIAGNOSIS — F419 Anxiety disorder, unspecified: Secondary | ICD-10-CM

## 2024-12-01 DIAGNOSIS — I25118 Atherosclerotic heart disease of native coronary artery with other forms of angina pectoris: Secondary | ICD-10-CM

## 2024-12-01 DIAGNOSIS — I739 Peripheral vascular disease, unspecified: Secondary | ICD-10-CM

## 2024-12-01 DIAGNOSIS — N1832 Chronic kidney disease, stage 3b: Secondary | ICD-10-CM | POA: Diagnosis not present

## 2024-12-01 DIAGNOSIS — E782 Mixed hyperlipidemia: Secondary | ICD-10-CM | POA: Diagnosis not present

## 2024-12-01 MED ORDER — ALPRAZOLAM 0.5 MG PO TABS: 0.5000 mg | ORAL_TABLET | Freq: Two times a day (BID) | ORAL | 3 refills | Status: AC | PRN

## 2024-12-01 NOTE — Progress Notes (Signed)
 "  Established Patient Office Visit  Subjective:  Patient ID: WLLIAM GROSSO, male    DOB: Apr 14, 1951  Age: 74 y.o. MRN: [id number]  CC:  Chief Complaint  Patient presents with   Hyperlipidemia    6 Month f/u     HPI Yossi E Borner is a 74 y.o. male with past medical history of CAD s/p CABG, HTN, HLD, COPD, anxiety and insomnia who presents for f/u of his chronic medical conditions.  HTN: BP is well-controlled. He takes losartan /hydrochlorothiazide  100-12.5 mg QD, amlodipine  5 mg QD and Imdur  30 mg QD currently.  Patient denies headache, dizziness or chest pain currently.  Atrial fibrillation: He has intermittent dyspnea and palpitations. He had cardiac ablation done in 05/25. He was evaluated by Dr. Kennyth and has been taking metoprolol  25 mg twice daily and Eliquis  5 mg twice daily.  CAD and PAD: He is placed on Imdur  for episodes of chest pain, which has improved his symptoms. He had right, femoral endarterectomy with profundoplasty and and arterectomy of the proximal SFA with bovine patch on 12/14/23. He was seen by vascular surgeon in the outpatient setting after the procedure as well.  HLD: He has started taking Vytorin . He is on Repatha  as well. His LDL had improved to 48 in 92/74.  He had a concern about hypoxia related to Repatha , which is less likely - I had lengthy discussion about it.  CKD: His BMP showed stable GFR at 38. His dose of HCTZ was reduced previously. Denies any dysuria, hematuria or urinary hesitancy or resistance.  He is trying to improve fluid intake.  Anxiety/insomnia/PTSD: Currently well controlled with Xanax .  He gets intermittent episodes of severe anxiety spells, where he gets flushed and has mild dyspnea, but they have been less frequent recently.  He also takes Ambien  for insomnia.  COPD: Has intermittent dyspnea and hypoxia spells at home, up to 70s in the last week.  Reports having viral URTI in the last week.  Did not go to ER, but applied home O2 at  nighttime, which improved hypoxia.  He uses Symbicort  twice daily and albuterol  inhaler/nebulizer as needed for dyspnea or wheezing.   Past Medical History:  Diagnosis Date   A-fib [facility])    tx by Dr Danelle Birmingham   Asthma    Atrial flutter [facility])    Diagnosed by ECG August 2015   Bilateral hearing loss 12/02/2018   no hearing aids   CKD (chronic kidney disease)    Colitis    COPD (chronic obstructive pulmonary disease) (HCC)    Coronary atherosclerosis of native coronary artery    Multivessel status post CABG   Essential hypertension    Hyperlipidemia    Insomnia    Peripheral vascular disease 11/11/2023   PTSD (post-traumatic stress disorder)     Past Surgical History:  Procedure Laterality Date   ABDOMINAL AORTOGRAM W/LOWER EXTREMITY Bilateral 12/03/2023   Procedure: ABDOMINAL AORTOGRAM W/LOWER EXTREMITY;  Surgeon: Gretta Lonni PARAS, MD;  Location: [facility] INVASIVE CV LAB;  Service: Cardiovascular;  Laterality: Bilateral;   ATRIAL FIBRILLATION ABLATION N/A 04/07/2024   Procedure: ATRIAL FIBRILLATION ABLATION;  Surgeon: Kennyth Chew, MD;  Location: [facility] INVASIVE CV LAB;  Service: Cardiovascular;  Laterality: N/A;   COLONOSCOPY  01/29/2005   Dr. Shaaron; rectal polyp s/p polypectomy, otherwise normal.  Pathology with tubulovillous adenoma.   COLONOSCOPY  02/06/2008   Dr. Shaaron; minimal internal hemorrhoids, diminutive polyp in the splenic flexure s/p cold biopsy removal, otherwise normal.  Pathology with benign  polypoid colonic mucosa.   COLONOSCOPY WITH PROPOFOL  N/A 08/30/2015   Procedure: COLONOSCOPY WITH PROPOFOL  at cecum at 0814; withdrawal time=8 minutes;  Surgeon: Lamar CHRISTELLA Hollingshead, MD; internal hemorrhoids-likely source of hematochezia, otherwise normal exam.  Repeat in 5 years.   COLONOSCOPY WITH PROPOFOL  N/A 01/28/2021   Procedure: COLONOSCOPY WITH PROPOFOL ;  Surgeon: Hollingshead Lamar CHRISTELLA, MD;  Location: [facility] ENDO [facility];  Service: Endoscopy;  Laterality: N/A;  am appt   CORONARY ARTERY  BYPASS GRAFT  2005   Dr. Fleeta Trigt: LIMA to LAD, right radial to circumflex, SVG to RCA   CORONARY ARTERY BYPASS GRAFT  10/16/2004   ELECTROPHYSIOLOGIC STUDY N/A 11/21/2016   Procedure: A-Flutter Ablation;  Surgeon: Danelle LELON Birmingham, MD;  Location: [facility] INVASIVE CV LAB;  Service: Cardiovascular;  Laterality: N/A;   ENDARTERECTOMY FEMORAL Right 12/14/2023   Procedure: RIGHT ENDARTERECTOMY  COMMON FEMORAL PROFUNDOPLASTY WITH XENSURE BOVINE PATCH;  Surgeon: Gretta Lonni PARAS, MD;  Location: [facility] OR;  Service: Vascular;  Laterality: Right;   TEE WITHOUT CARDIOVERSION N/A 11/21/2016   Procedure: TRANSESOPHAGEAL ECHOCARDIOGRAM (TEE);  Surgeon: Jerel Balding, MD;  Location: [facility] ENDOSCOPY;  Service: Cardiovascular;  Laterality: N/A;    Family History  Problem Relation Age of Onset   Hypertension Mother    Alcohol abuse Mother    Alcohol abuse Father    Colon cancer Neg Hx     Social History   Socioeconomic History   Marital status: Married    Spouse name: Not on file   Number of children: 3   Years of education: Not on file   Highest education level: Not on file  Occupational History   Not on file  Tobacco Use   Smoking status: Former    Current packs/day: 0.00    Average packs/day: 2.0 packs/day for 27.0 years (54.0 ttl pk-yrs)    Types: Cigarettes    Start date: 02/01/1971    Quit date: 01/31/1998    Years since quitting: 26.8   Smokeless tobacco: Current    Types: Chew  Vaping Use   Vaping status: Never Used  Substance and Sexual Activity   Alcohol use: Yes    Alcohol/week: 14.0 - 28.0 standard drinks of alcohol    Types: 14 - 28 Cans of beer per week    Comment: Drinks beer daily, typically 2-4 a day.   Drug use: No   Sexual activity: Yes  Other Topics Concern   Not on file  Social History Narrative   Lives with wife, Delon   Social Drivers of Health   Tobacco Use: High Risk (12/01/2024)   Patient History    Smoking Tobacco Use: Former    Smokeless Tobacco Use:  Current    Passive Exposure: Not on Actuary Strain: Not on file  Food Insecurity: No Food Insecurity (12/16/2023)   Hunger Vital Sign    Worried About Running Out of Food in the Last Year: Never true    Ran Out of Food in the Last Year: Never true  Transportation Needs: No Transportation Needs (12/16/2023)   PRAPARE - Administrator, Civil Service (Medical): No    Lack of Transportation (Non-Medical): No  Physical Activity: Not on file  Stress: Not on file  Social Connections: Unknown (12/14/2023)   Social Connection and Isolation Panel    Frequency of Communication with Friends and Family: More than three times a week    Frequency of Social Gatherings with Friends and Family: More than three times a week  Attends Religious Services: Not on file    Active Member of Clubs or Organizations: Not on file    Attends Club or Organization Meetings: Not on file    Marital Status: Not on file  Intimate Partner Violence: Not At Risk (12/16/2023)   Humiliation, Afraid, Rape, and Kick questionnaire    Fear of Current or Ex-Partner: No    Emotionally Abused: No    Physically Abused: No    Sexually Abused: No  Depression (PHQ2-9): Medium Risk (12/01/2024)   Depression (PHQ2-9)    PHQ-2 Score: 5  Alcohol Screen: Not on file  Housing: Unknown (12/16/2023)   Housing Stability Vital Sign    Unable to Pay for Housing in the Last Year: No    Number of Times Moved in the Last Year: Not on file    Homeless in the Last Year: No  Utilities: Not At Risk (12/16/2023)   AHC Utilities    Threatened with loss of utilities: No  Health Literacy: Not on file    Outpatient Medications Prior to Visit  Medication Sig Dispense Refill   albuterol  (PROVENTIL ) (2.5 MG/3ML) 0.083% nebulizer solution Take 3 mLs (2.5 mg total) by nebulization every 6 (six) hours as needed for wheezing or shortness of breath. 360 mL 3   albuterol  (VENTOLIN  HFA) 108 (90 Base) MCG/ACT inhaler INHALE 2 PUFFS  INTO THE LUNGS EVERY 4 HOURS AS NEEDED FOR WHEEZE 54 each 1   amLODipine  (NORVASC ) 5 MG tablet Take 1 tablet (5 mg total) by mouth daily. 90 tablet 3   amoxicillin  (AMOXIL ) 500 MG capsule TAKE 4 CAPSULES BY MOUTH 30 MINUTES PRIOR TO DENTAL PROCEDURE. 4 capsule 2   apixaban  (ELIQUIS ) 5 MG TABS tablet Take 1 tablet (5 mg total) by mouth 2 (two) times daily.     budesonide -formoterol  (SYMBICORT ) 160-4.5 MCG/ACT inhaler TAKE 2 PUFFS FIRST THING IN MORNING AND THEN ANOTHER 2 PUFFS ABOUT 12 HOURS LATER. 30.6 each 1   Cholecalciferol (VITAMIN D3) 125 MCG (5000 UT) CAPS Take 5,000 Units by mouth in the morning.     Evolocumab  (REPATHA  SURECLICK) 140 MG/ML SOAJ INJECT 140 MG INTO THE SKIN EVERY 14 (FOURTEEN) DAYS. 6 mL 1   ezetimibe -simvastatin  (VYTORIN ) 10-40 MG tablet TAKE 1 TABLET BY MOUTH EVERY DAY 90 tablet 1   fenofibrate  160 MG tablet TAKE 1 TABLET BY MOUTH EVERY DAY 90 tablet 1   fish oil-omega-3 fatty acids 1000 MG capsule Take 1 g by mouth at bedtime.     isosorbide  mononitrate (IMDUR ) 30 MG 24 hr tablet TAKE 1 TABLET BY MOUTH AT BEDTIME. 90 tablet 3   losartan -hydrochlorothiazide  (HYZAAR) 100-12.5 MG tablet TAKE 1 TABLET BY MOUTH EVERY DAY 56 tablet 3   metoprolol  tartrate (LOPRESSOR ) 25 MG tablet Take 1 tablet (25 mg total) by mouth 2 (two) times daily. 180 tablet 3   naproxen sodium (ALEVE) 220 MG tablet Take 220 mg by mouth daily as needed (back pain.).     nitroGLYCERIN  (NITROSTAT ) 0.4 MG SL tablet PLACE 1 TABLET (0.4 MG TOTAL) UNDER THE TONGUE EVERY 5 (FIVE) MINUTES AS NEEDED. 25 tablet 3   Respiratory Therapy Supplies (NEBULIZER/TUBING/MOUTHPIECE) KIT Nebulizer tubing and mouthpiece 1 kit 0   zolpidem  (AMBIEN ) 10 MG tablet TAKE 1 TABLET (10 MG TOTAL) BY MOUTH AT BEDTIME AS NEEDED FOR SLEEP 30 tablet 3   ALPRAZolam  (XANAX ) 0.5 MG tablet TAKE 1 TABLET BY MOUTH TWICE A DAY AS NEEDED FOR ANXIETY 30 tablet 3   amoxicillin -clavulanate (AUGMENTIN ) 875-125 MG tablet TAKE 1  TABLET BY MOUTH TWICE A  DAY 14 tablet 0   methylPREDNISolone  (MEDROL  DOSEPAK) 4 MG TBPK tablet TAKE 6 TABLETS ON DAY 1 AS DIRECTED ON PACKAGE AND DECREASE BY 1 TAB EACH DAY FOR A TOTAL OF 6 DAYS 21 each 0   No facility-administered medications prior to visit.    Allergies  Allergen Reactions   Ace Inhibitors Shortness Of Breath and Cough   Enalapril  Cough    Pt has been switched to Valsartan  and is tolerating the different class.   Other     Vein in Right Leg and Right Arm is missing due to a open heart surgery in 2005   Oxycodone  Itching   Dexamethasone  Itching    Pt was prescribed this in december   Alirocumab  Rash and Other (See Comments)    Blisters *Praluent    Clotrimazole -Betamethasone  Rash   Prednisone  Rash    ROS Review of Systems  Constitutional:  Negative for chills and fever.  HENT:  Negative for congestion and sore throat.   Eyes:  Negative for pain and discharge.  Respiratory:  Positive for shortness of breath. Negative for cough.   Cardiovascular:  Positive for palpitations. Negative for chest pain.  Gastrointestinal:  Negative for diarrhea, nausea and vomiting.  Endocrine: Negative for polydipsia and polyuria.  Genitourinary:  Negative for dysuria, flank pain and hematuria.       Nocturia  Musculoskeletal:  Negative for neck pain and neck stiffness.       Leg pain  Skin:  Positive for rash.       Skin tag in axillae  Neurological:  Negative for dizziness and weakness.  Psychiatric/Behavioral:  Positive for sleep disturbance. Negative for agitation, behavioral problems, dysphoric mood and suicidal ideas. The patient is nervous/anxious.       Objective:    Physical Exam Vitals reviewed.  Constitutional:      General: He is not in acute distress.    Appearance: He is not diaphoretic.  HENT:     Head: Normocephalic and atraumatic.     Nose: Nose normal.     Mouth/Throat:     Mouth: Mucous membranes are moist.  Eyes:     General: No scleral icterus.    Extraocular  Movements: Extraocular movements intact.  Cardiovascular:     Rate and Rhythm: Normal rate and regular rhythm.     Heart sounds: Normal heart sounds. No murmur heard. Pulmonary:     Breath sounds: Normal breath sounds. No wheezing or rales.  Musculoskeletal:     Cervical back: Neck supple. No tenderness.     Right lower leg: No edema.     Left lower leg: No edema.  Skin:    General: Skin is warm.     Findings: Rash (Recurrent erythematous rash in groin area) present.     Comments: Skin tags in bilateral axillae  Neurological:     General: No focal deficit present.     Mental Status: He is alert and oriented to person, place, and time.     Motor: Weakness (Bilateral LE-4/5) present.  Psychiatric:        Mood and Affect: Mood is anxious.        Behavior: Behavior normal.     BP 109/69   Pulse 77   Ht 6' 1 (1.854 m)   Wt 211 lb (95.7 kg)   SpO2 94%   BMI 27.84 kg/m  Wt Readings from Last 3 Encounters:  12/01/24 211 lb (95.7 kg)  09/27/24 205 lb (  93 kg)  08/01/24 213 lb 6.4 oz (96.8 kg)    Lab Results  Component Value Date   TSH 4.160 06/08/2024   Lab Results  Component Value Date   WBC 8.3 06/08/2024   HGB 13.0 06/08/2024   HCT 41.2 06/08/2024   MCV 93 06/08/2024   PLT 157 06/08/2024   Lab Results  Component Value Date   NA 135 06/08/2024   K 4.7 06/08/2024   CO2 27 06/08/2024   GLUCOSE 101 (H) 06/08/2024   BUN 23 06/08/2024   CREATININE 1.85 (H) 06/08/2024   BILITOT 0.5 06/08/2024   ALKPHOS 25 (L) 06/08/2024   AST 19 06/08/2024   ALT 12 06/08/2024   PROT 6.2 06/08/2024   ALBUMIN  4.2 06/08/2024   CALCIUM  9.8 06/08/2024   ANIONGAP 9 12/15/2023   EGFR 38 (L) 06/08/2024   GFR 60.41 08/13/2015   Lab Results  Component Value Date   CHOL 117 06/08/2024   Lab Results  Component Value Date   HDL 52 06/08/2024   Lab Results  Component Value Date   LDLCALC 48 06/08/2024   Lab Results  Component Value Date   TRIG 85 06/08/2024   Lab Results   Component Value Date   CHOLHDL 2.3 06/08/2024   Lab Results  Component Value Date   HGBA1C 5.9 (H) 06/25/2023      Assessment & Plan:   Problem List Items Addressed This Visit       Cardiovascular and Mediastinum   PAD (peripheral artery disease)   Followed by Vascular surgery - On 12/14/2023 he underwent right common femoral endarterectomy with profundoplasty and endarterectomy of the proximal SFA with bovine patch.  Had leg claudication symptoms, improved now Was on aspirin  in the past, but had recurrent GI bleeding with it - continue QOD for now On Eliquis  for A Fib On Repatha       Paroxysmal atrial fibrillation (HCC) - Primary   Recent Zio patch monitor showed paroxysmal A Fib in 2025 On metoprolol  25 mg BID for rate control On aspirin  81 mg QD and Eliquis  5 mg QD (had rectal bleeding with BID dosing) Has seen EP cardiology-Dr. Kennyth, for consideration for Watchman device        Respiratory   COPD (chronic obstructive pulmonary disease) (HCC)   Quit smoking in 1999 On Symbicort  and Albuterol  Advised to comply with Symbicort  to avoid frequent exacerbations      Chronic respiratory failure with hypoxia (HCC)   Likely from A-fib and COPD Would benefit from home oxygen  for as needed use, especially at nighttime Has home O2 through [employer]        Genitourinary   Chronic kidney disease, stage 3b (HCC)   CMP reviewed GFR usually around 40 Will monitor for now On ARB Decreased dose of diuretic previously Advised for proper hydration Avoid nephrotoxic agents        Other   HLD (hyperlipidemia) (Chronic)   Used to follow up with lipid clinic Was on Praluent , had severe fatigue and rash with it -could not tolerate it Check lipid profile - LDL at goal in 07/25, on Repatha  and Vytorin  now Had lengthy discussion to continue Repatha  for now, he agrees.      Anxiety (Chronic)   Takes Xanax  PRN, for panic episodes - PDMP reviewed, refilled Associated with  previous incident with his wife (cardiac arrest) Symptoms better now Takes Ambien  10 mg QD for insomnia      Relevant Medications   ALPRAZolam  (XANAX ) 0.5 MG tablet  Meds ordered this encounter  Medications   ALPRAZolam  (XANAX ) 0.5 MG tablet    Sig: Take 1 tablet (0.5 mg total) by mouth 2 (two) times daily as needed for anxiety.    Dispense:  30 tablet    Refill:  3    This request is for a new prescription for a controlled substance as required by Federal/State law.    Follow-up: Return in about 4 months (around 03/31/2025).    Suzzane MARLA Blanch, MD "

## 2024-12-01 NOTE — Assessment & Plan Note (Addendum)
 Recent Zio patch monitor showed paroxysmal A Fib in 2025 On metoprolol  25 mg BID for rate control On aspirin  81 mg QD and Eliquis  5 mg QD (had rectal bleeding with BID dosing) Has seen EP cardiology-Dr. Kennyth, for consideration for Watchman device

## 2024-12-01 NOTE — Assessment & Plan Note (Signed)
Quit smoking in 1999 On Symbicort and Albuterol Advised to comply with Symbicort to avoid frequent exacerbations

## 2024-12-01 NOTE — Assessment & Plan Note (Signed)
 Takes Xanax  PRN, for panic episodes - PDMP reviewed, refilled Associated with previous incident with his wife (cardiac arrest) Symptoms better now Takes Ambien  10 mg QD for insomnia

## 2024-12-01 NOTE — Assessment & Plan Note (Signed)
 CMP reviewed GFR usually around 40 Will monitor for now On ARB Decreased dose of diuretic previously Advised for proper hydration Avoid nephrotoxic agents

## 2024-12-01 NOTE — Assessment & Plan Note (Signed)
 Likely from A-fib and COPD Would benefit from home oxygen  for as needed use, especially at nighttime Has home O2 through [employer]

## 2024-12-01 NOTE — Assessment & Plan Note (Signed)
 Followed by Vascular surgery - On 12/14/2023 he underwent right common femoral endarterectomy with profundoplasty and endarterectomy of the proximal SFA with bovine patch.  Had leg claudication symptoms, improved now Was on aspirin  in the past, but had recurrent GI bleeding with it - continue QOD for now On Eliquis  for A Fib On Repatha 

## 2024-12-01 NOTE — Patient Instructions (Signed)
 Please continue to take medications as prescribed.  Please continue to follow low salt diet and perform moderate exercise/walking as tolerated.

## 2024-12-01 NOTE — Assessment & Plan Note (Signed)
 Used to follow up with lipid clinic Was on Praluent , had severe fatigue and rash with it -could not tolerate it Check lipid profile - LDL at goal in 07/25, on Repatha  and Vytorin  now Had lengthy discussion to continue Repatha  for now, he agrees.

## 2024-12-02 ENCOUNTER — Ambulatory Visit: Payer: Self-pay | Admitting: Internal Medicine

## 2024-12-02 ENCOUNTER — Telehealth: Payer: Self-pay | Admitting: Pharmacy Technician

## 2024-12-02 ENCOUNTER — Other Ambulatory Visit (HOSPITAL_COMMUNITY): Payer: Self-pay

## 2024-12-02 LAB — CBC WITH DIFFERENTIAL/PLATELET
Basophils Absolute: 0.1 x10E3/uL (ref 0.0–0.2)
Basos: 1 %
EOS (ABSOLUTE): 0.2 x10E3/uL (ref 0.0–0.4)
Eos: 2 %
Hematocrit: 40.3 % (ref 37.5–51.0)
Hemoglobin: 12.7 g/dL — ABNORMAL LOW (ref 13.0–17.7)
Immature Grans (Abs): 0.1 x10E3/uL (ref 0.0–0.1)
Immature Granulocytes: 1 %
Lymphocytes Absolute: 2.5 x10E3/uL (ref 0.7–3.1)
Lymphs: 25 %
MCH: 29.5 pg (ref 26.6–33.0)
MCHC: 31.5 g/dL (ref 31.5–35.7)
MCV: 94 fL (ref 79–97)
Monocytes Absolute: 0.9 x10E3/uL (ref 0.1–0.9)
Monocytes: 9 %
Neutrophils Absolute: 6.2 x10E3/uL (ref 1.4–7.0)
Neutrophils: 62 %
Platelets: 239 x10E3/uL (ref 150–450)
RBC: 4.3 x10E6/uL (ref 4.14–5.80)
RDW: 12.5 % (ref 11.6–15.4)
WBC: 10 x10E3/uL (ref 3.4–10.8)

## 2024-12-02 LAB — CMP14+EGFR
ALT: 16 IU/L (ref 0–44)
AST: 22 IU/L (ref 0–40)
Albumin: 3.9 g/dL (ref 3.8–4.8)
Alkaline Phosphatase: 28 IU/L — ABNORMAL LOW (ref 47–123)
BUN/Creatinine Ratio: 14 (ref 10–24)
BUN: 23 mg/dL (ref 8–27)
Bilirubin Total: 0.4 mg/dL (ref 0.0–1.2)
CO2: 27 mmol/L (ref 20–29)
Calcium: 9.6 mg/dL (ref 8.6–10.2)
Chloride: 98 mmol/L (ref 96–106)
Creatinine, Ser: 1.63 mg/dL — ABNORMAL HIGH (ref 0.76–1.27)
Globulin, Total: 2.2 g/dL (ref 1.5–4.5)
Glucose: 96 mg/dL (ref 70–99)
Potassium: 4.7 mmol/L (ref 3.5–5.2)
Sodium: 139 mmol/L (ref 134–144)
Total Protein: 6.1 g/dL (ref 6.0–8.5)
eGFR: 45 mL/min/1.73 — ABNORMAL LOW

## 2024-12-02 LAB — HEMOGLOBIN A1C
Est. average glucose Bld gHb Est-mCnc: 126 mg/dL
Hgb A1c MFr Bld: 6 % — ABNORMAL HIGH (ref 4.8–5.6)

## 2024-12-02 LAB — LIPID PANEL
Chol/HDL Ratio: 2.8 ratio (ref 0.0–5.0)
Cholesterol, Total: 114 mg/dL (ref 100–199)
HDL: 41 mg/dL
LDL Chol Calc (NIH): 56 mg/dL (ref 0–99)
Triglycerides: 90 mg/dL (ref 0–149)
VLDL Cholesterol Cal: 17 mg/dL (ref 5–40)

## 2024-12-02 LAB — PSA: Prostate Specific Ag, Serum: 1.8 ng/mL (ref 0.0–4.0)

## 2024-12-02 NOTE — Telephone Encounter (Signed)
 PA request has been Approved. New Encounter has been or will be created for follow up. For additional info see Pharmacy Prior Auth telephone encounter from 12/02/2024.

## 2024-12-02 NOTE — Telephone Encounter (Signed)
 Pharmacy Patient Advocate Encounter   Received notification from RX Request Messages that prior authorization for Repatha  SureClick 140MG /ML auto-injectors is required/requested.   Insurance verification completed.   The patient is insured through [employer].   Per test claim: PA required; PA submitted to above mentioned insurance via Latent Key/confirmation #/EOC [facility] Status is pending

## 2024-12-02 NOTE — Telephone Encounter (Signed)
 Pharmacy Patient Advocate Encounter  Received notification from [facility] that Prior Authorization for Repatha  SureClick 140MG /ML auto-injectors has been APPROVED from 11/24/2024 to 12/02/2025. Spoke to pharmacy to process.Copay is $694.68/3 month supply or $432.54/1 month supply.   Patient will need to call [facility] and advise which way he would like to have it processed 1 month vs 3 month.   He has a $395 deductible. After the deductible is met has has a 25% copayment of total drug cost.    PA #/Case ID/Reference #: [id number]

## 2024-12-05 NOTE — Telephone Encounter (Signed)
 Pt informed

## 2024-12-08 ENCOUNTER — Encounter: Payer: Self-pay | Admitting: Internal Medicine

## 2024-12-08 ENCOUNTER — Other Ambulatory Visit: Payer: Self-pay | Admitting: Internal Medicine

## 2024-12-08 DIAGNOSIS — E782 Mixed hyperlipidemia: Secondary | ICD-10-CM

## 2024-12-08 DIAGNOSIS — I739 Peripheral vascular disease, unspecified: Secondary | ICD-10-CM

## 2024-12-08 DIAGNOSIS — I10 Essential (primary) hypertension: Secondary | ICD-10-CM

## 2024-12-08 DIAGNOSIS — I25118 Atherosclerotic heart disease of native coronary artery with other forms of angina pectoris: Secondary | ICD-10-CM

## 2024-12-08 DIAGNOSIS — J42 Unspecified chronic bronchitis: Secondary | ICD-10-CM

## 2024-12-09 ENCOUNTER — Telehealth: Payer: Self-pay

## 2024-12-09 NOTE — Progress Notes (Signed)
 Care Guide Pharmacy Note  12/09/2024 Name: Dennis [name] MRN: [id number] DOB: Jan 21, 1951  Referred By: [name], MD Reason for referral: Complex Care Management (Outreach to schedule with Pharm d )   Dennis [name] is a 74 y.o. year old male who is a primary care patient of [name], MD.  Dennis [name] Dennis [name] was referred to the pharmacist for assistance related to: HTN and HLD  Successful contact was made with the patient to discuss pharmacy services including being ready for the pharmacist to call at least 5 minutes before the scheduled appointment time and to have medication bottles and any blood pressure readings ready for review. The patient agreed to meet with the pharmacist via telephone visit on (date/time).12/15/2024  Dennis [name] , RMA     [place]  [facility], [facility] Guide  Direct Dial: [phone]  Website: [email or web address]

## 2024-12-09 NOTE — Progress Notes (Signed)
 Care Guide Pharmacy Note  12/09/2024 Name: Dennis [name] MRN: [id number] DOB: 1951-05-30  Referred By: [name], MD Reason for referral: Complex Care Management (Outreach to schedule with Pharm d )   Dennis [name] is a 74 y.o. year old male who is a primary care patient of Tobie, Suzzane POUR, MD.  Dennis [name] was referred to the pharmacist for assistance related to: HTN, HLD, and COPD  An unsuccessful telephone outreach was attempted today to contact the patient who was referred to the pharmacy team for assistance with medication assistance. Additional attempts will be made to contact the patient.  Jeoffrey Buffalo , RMA     [facility]  [facility], [facility] Guide  Direct Dial: [phone]  Website: [email or web address]

## 2024-12-11 ENCOUNTER — Other Ambulatory Visit: Payer: Self-pay | Admitting: Internal Medicine

## 2024-12-13 ENCOUNTER — Telehealth: Payer: Self-pay

## 2024-12-13 NOTE — Telephone Encounter (Signed)
 Spoke with patient to arrange 6 month f/u with Dr. Kennyth. He was grateful for call.

## 2024-12-15 ENCOUNTER — Ambulatory Visit: Payer: Self-pay | Admitting: Internal Medicine

## 2024-12-15 ENCOUNTER — Other Ambulatory Visit (INDEPENDENT_AMBULATORY_CARE_PROVIDER_SITE_OTHER): Payer: Self-pay

## 2024-12-15 ENCOUNTER — Telehealth: Payer: Self-pay

## 2024-12-15 DIAGNOSIS — E782 Mixed hyperlipidemia: Secondary | ICD-10-CM

## 2024-12-15 NOTE — Progress Notes (Signed)
 Spoke with Randall regarding [employer], aware of this status and that it should be $0 once paying [facility].

## 2024-12-15 NOTE — Progress Notes (Signed)
" ° °  12/15/2024 Name: Dennis [name] MRN: [id number] DOB: [birth year]  Chief Complaint  Patient presents with   Medication Management   Hyperlipidemia    Dennis [name] is a 74 y.o. year old male who presented for a telephone visit.   They were referred to the pharmacist by their PCP for assistance in managing hypertension, hyperlipidemia/cardiovascular risk reduction, and medication access.    Subjective:  Care Team: Primary Care Provider: [name], MD ; Next Scheduled Visit:  Future Appointments  Date Time Provider Department Center  12/21/2024  8:45 AM Darlean Ozell NOVAK, MD [facility] [address]  03/30/2025  1:40 PM [name], MD [facility] [address]  04/07/2025 10:30 AM Kennyth Chew, MD [facility]   Medication Access/Adherence  Current Pharmacy:  [phone] [place], [place] - [address] AT [facility] [address] [place] [address] Phone: [phone] Fax: [phone]  [facility] [place] [facility] [address] [place] [address] Phone: [phone] Fax: [phone]  [facility] [place], [place] - [facility] AT [facility] [address] [place] [address] Phone: [phone] Fax: [phone]   Patient reports affordability concerns with their medications: Yes  Patient reports access/transportation concerns to their pharmacy: No  Patient reports adherence concerns with their medications:  No  other than cost   Hyperlipidemia/ASCVD Risk Reduction  Current lipid lowering medications: repatha , ezetimibe -simvastatin  Medications tried in the past: praluent  (rash)  Antiplatelet regimen:   ASCVD History: CABGx3, family Hx  Current medication access support: none at this time.    Objective:  Lab Results  Component Value Date   HGBA1C 6.0 (H) 12/01/2024    Lab Results  Component Value Date   CREATININE 1.63 (H)  12/01/2024   BUN 23 12/01/2024   NA 139 12/01/2024   K 4.7 12/01/2024   CL 98 12/01/2024   CO2 27 12/01/2024    Lab Results  Component Value Date   CHOL 114 12/01/2024   HDL 41 12/01/2024   LDLCALC 56 12/01/2024   TRIG 90 12/01/2024   CHOLHDL 2.8 12/01/2024   Assessment/Plan:   Hyperlipidemia/ASCVD Risk Reduction: - Currently controlled.  - Reviewed long term complications of uncontrolled cholesterol - Recommend to continue vytorin  and repatha  as is  - Meets financial criteria for repatha  patient assistance program through [employer]. Will contact [employer] to pursue assistance. Patient consented to having me apply on his behalf.   Follow Up Plan: contacted [employer] obtain approval   Patient approved, grant details are below. [facility] for when repatha  is able to be renewed.   Reference Number: [id number] Oliver Mccreadie Fund: Hypercholesterolemia - Medicare Access Assistance Type: Co-pay Start Date: 11/15/2024 End Date: 11/14/2025 [name] Amount: $2,500 Grant Balance: $2,500   Pharmacy Card   Card No.: [id number] Card Status: Active BIN: [id number] PCN: [id number] PC Group: [id number]  Help Desk: [id number] Provider: PDMI Processor: PDMI      Rx drug deductible is $395 $133.69 30 DS $402.04 90 DS     [name], PharmD, BCGP Clinical Pharmacist  [phone]   "

## 2024-12-15 NOTE — Progress Notes (Signed)
" ° °  12/15/2024 Name: Dennis [name] MRN: [id number] DOB: 05/05/1951  Chief Complaint  Patient presents with   Medication Management   Hyperlipidemia    Attempted to reach patient to update him on approval status for [employer] grant to help with repatha  copay. [name] is back dated 11/15/2024 so is eligible to seek reimbursement on payments made on or after 11/15/2024.   [name] information has already been [facility] so once able to renew Rx should be completely covered at no cost to patient.   [name], PharmD, BCGP Clinical Pharmacist  [phone]  "

## 2024-12-21 ENCOUNTER — Other Ambulatory Visit: Payer: Self-pay | Admitting: Internal Medicine

## 2024-12-21 ENCOUNTER — Ambulatory Visit: Payer: Self-pay | Admitting: Internal Medicine

## 2024-12-21 DIAGNOSIS — J42 Unspecified chronic bronchitis: Secondary | ICD-10-CM

## 2024-12-30 ENCOUNTER — Other Ambulatory Visit: Payer: Self-pay | Admitting: Internal Medicine

## 2024-12-30 DIAGNOSIS — I739 Peripheral vascular disease, unspecified: Secondary | ICD-10-CM

## 2024-12-30 DIAGNOSIS — I25118 Atherosclerotic heart disease of native coronary artery with other forms of angina pectoris: Secondary | ICD-10-CM

## 2025-02-01 ENCOUNTER — Ambulatory Visit: Admitting: Internal Medicine

## 2025-03-30 ENCOUNTER — Ambulatory Visit: Payer: Self-pay | Admitting: Internal Medicine

## 2025-04-07 ENCOUNTER — Ambulatory Visit: Admitting: Cardiology
